# Patient Record
Sex: Male | Born: 1955
Health system: Southern US, Community
[De-identification: ages and names within clinical notes are randomized; demographics above are authoritative.]

## PROBLEM LIST (undated history)

## (undated) DIAGNOSIS — Z86010 Personal history of colonic polyps: Secondary | ICD-10-CM

## (undated) DIAGNOSIS — I639 Cerebral infarction, unspecified: Secondary | ICD-10-CM

## (undated) DIAGNOSIS — M1990 Unspecified osteoarthritis, unspecified site: Secondary | ICD-10-CM

## (undated) DIAGNOSIS — I10 Essential (primary) hypertension: Secondary | ICD-10-CM

## (undated) DIAGNOSIS — R519 Headache, unspecified: Secondary | ICD-10-CM

## (undated) DIAGNOSIS — E785 Hyperlipidemia, unspecified: Secondary | ICD-10-CM

## (undated) DIAGNOSIS — R112 Nausea with vomiting, unspecified: Secondary | ICD-10-CM

## (undated) DIAGNOSIS — F1410 Cocaine abuse, uncomplicated: Secondary | ICD-10-CM

## (undated) DIAGNOSIS — Z860101 Personal history of adenomatous and serrated colon polyps: Secondary | ICD-10-CM

## (undated) DIAGNOSIS — Z9889 Other specified postprocedural states: Secondary | ICD-10-CM

## (undated) DIAGNOSIS — F1010 Alcohol abuse, uncomplicated: Secondary | ICD-10-CM

## (undated) HISTORY — DX: Unspecified osteoarthritis, unspecified site: M19.90

## (undated) HISTORY — PX: HAND SURGERY: SHX662

## (undated) HISTORY — DX: Hyperlipidemia, unspecified: E78.5

## (undated) HISTORY — DX: Cerebral infarction, unspecified: I63.9

## (undated) HISTORY — DX: Personal history of colonic polyps: Z86.010

## (undated) HISTORY — DX: Personal history of adenomatous and serrated colon polyps: Z86.0101

## (undated) HISTORY — PX: COLONOSCOPY: SHX174

## (undated) HISTORY — PX: OTHER SURGICAL HISTORY: SHX169

---

## 1998-08-26 ENCOUNTER — Emergency Department (HOSPITAL_COMMUNITY): Admission: EM | Admit: 1998-08-26 | Discharge: 1998-08-26 | Payer: Self-pay | Admitting: Emergency Medicine

## 1998-08-26 ENCOUNTER — Encounter: Payer: Self-pay | Admitting: Emergency Medicine

## 1998-10-08 ENCOUNTER — Encounter: Payer: Self-pay | Admitting: Family Medicine

## 1998-10-08 ENCOUNTER — Inpatient Hospital Stay (HOSPITAL_COMMUNITY): Admission: EM | Admit: 1998-10-08 | Discharge: 1998-10-13 | Payer: Self-pay | Admitting: Emergency Medicine

## 1998-10-11 ENCOUNTER — Encounter: Payer: Self-pay | Admitting: Family Medicine

## 1998-10-12 ENCOUNTER — Encounter: Payer: Self-pay | Admitting: Cardiology

## 1999-12-16 ENCOUNTER — Emergency Department (HOSPITAL_COMMUNITY): Admission: EM | Admit: 1999-12-16 | Discharge: 1999-12-16 | Payer: Self-pay | Admitting: *Deleted

## 2001-09-15 ENCOUNTER — Emergency Department (HOSPITAL_COMMUNITY): Admission: EM | Admit: 2001-09-15 | Discharge: 2001-09-15 | Payer: Self-pay

## 2002-02-28 ENCOUNTER — Emergency Department (HOSPITAL_COMMUNITY): Admission: EM | Admit: 2002-02-28 | Discharge: 2002-02-28 | Payer: Self-pay | Admitting: Emergency Medicine

## 2002-04-11 ENCOUNTER — Emergency Department (HOSPITAL_COMMUNITY): Admission: EM | Admit: 2002-04-11 | Discharge: 2002-04-11 | Payer: Self-pay | Admitting: Emergency Medicine

## 2003-07-13 ENCOUNTER — Emergency Department (HOSPITAL_COMMUNITY): Admission: EM | Admit: 2003-07-13 | Discharge: 2003-07-13 | Payer: Self-pay

## 2005-07-09 ENCOUNTER — Emergency Department (HOSPITAL_COMMUNITY): Admission: EM | Admit: 2005-07-09 | Discharge: 2005-07-09 | Payer: Self-pay | Admitting: Emergency Medicine

## 2005-07-19 ENCOUNTER — Ambulatory Visit: Payer: Self-pay | Admitting: Family Medicine

## 2005-08-02 ENCOUNTER — Ambulatory Visit: Payer: Self-pay | Admitting: Family Medicine

## 2006-02-11 ENCOUNTER — Ambulatory Visit: Payer: Self-pay | Admitting: Cardiology

## 2006-02-11 ENCOUNTER — Inpatient Hospital Stay (HOSPITAL_COMMUNITY): Admission: EM | Admit: 2006-02-11 | Discharge: 2006-02-13 | Payer: Self-pay | Admitting: *Deleted

## 2006-02-17 ENCOUNTER — Ambulatory Visit: Payer: Self-pay | Admitting: Family Medicine

## 2006-02-21 ENCOUNTER — Ambulatory Visit: Payer: Self-pay | Admitting: *Deleted

## 2006-03-27 ENCOUNTER — Ambulatory Visit: Payer: Self-pay | Admitting: Family Medicine

## 2006-08-08 ENCOUNTER — Ambulatory Visit: Payer: Self-pay | Admitting: Family Medicine

## 2006-08-17 ENCOUNTER — Ambulatory Visit: Payer: Self-pay | Admitting: Internal Medicine

## 2007-04-04 ENCOUNTER — Encounter (INDEPENDENT_AMBULATORY_CARE_PROVIDER_SITE_OTHER): Payer: Self-pay | Admitting: *Deleted

## 2007-05-31 DIAGNOSIS — N410 Acute prostatitis: Secondary | ICD-10-CM | POA: Insufficient documentation

## 2007-05-31 DIAGNOSIS — S381XXA Crushing injury of abdomen, lower back, and pelvis, initial encounter: Secondary | ICD-10-CM | POA: Insufficient documentation

## 2007-05-31 DIAGNOSIS — I10 Essential (primary) hypertension: Secondary | ICD-10-CM | POA: Insufficient documentation

## 2007-05-31 DIAGNOSIS — A5400 Gonococcal infection of lower genitourinary tract, unspecified: Secondary | ICD-10-CM | POA: Insufficient documentation

## 2007-05-31 DIAGNOSIS — F1010 Alcohol abuse, uncomplicated: Secondary | ICD-10-CM | POA: Insufficient documentation

## 2007-05-31 DIAGNOSIS — F1410 Cocaine abuse, uncomplicated: Secondary | ICD-10-CM | POA: Insufficient documentation

## 2007-08-23 ENCOUNTER — Ambulatory Visit: Payer: Self-pay | Admitting: Internal Medicine

## 2007-08-27 ENCOUNTER — Ambulatory Visit: Payer: Self-pay | Admitting: Internal Medicine

## 2008-03-04 ENCOUNTER — Encounter (INDEPENDENT_AMBULATORY_CARE_PROVIDER_SITE_OTHER): Payer: Self-pay | Admitting: *Deleted

## 2008-08-25 ENCOUNTER — Ambulatory Visit: Payer: Self-pay | Admitting: Internal Medicine

## 2008-08-26 ENCOUNTER — Encounter (INDEPENDENT_AMBULATORY_CARE_PROVIDER_SITE_OTHER): Payer: Self-pay | Admitting: Adult Health

## 2008-08-26 LAB — CONVERTED CEMR LAB
AST: 15 units/L (ref 0–37)
Albumin: 4.3 g/dL (ref 3.5–5.2)
BUN: 13 mg/dL (ref 6–23)
Benzodiazepines.: NEGATIVE
CO2: 23 meq/L (ref 19–32)
Calcium: 9.4 mg/dL (ref 8.4–10.5)
Chlamydia, Swab/Urine, PCR: NEGATIVE
Chloride: 102 meq/L (ref 96–112)
Creatinine, Ser: 0.78 mg/dL (ref 0.40–1.50)
Creatinine,U: 111.4 mg/dL
Glucose, Bld: 81 mg/dL (ref 70–99)
Hemoglobin: 13.6 g/dL (ref 13.0–17.0)
Lymphs Abs: 1.3 10*3/uL (ref 0.7–4.0)
Marijuana Metabolite: NEGATIVE
Methadone: NEGATIVE
Monocytes Absolute: 0.5 10*3/uL (ref 0.1–1.0)
Monocytes Relative: 10 % (ref 3–12)
Neutro Abs: 2.7 10*3/uL (ref 1.7–7.7)
Neutrophils Relative %: 57 % (ref 43–77)
PSA: 1.8 ng/mL (ref 0.10–4.00)
Potassium: 4.2 meq/L (ref 3.5–5.3)
Propoxyphene: NEGATIVE
RBC: 4.38 M/uL (ref 4.22–5.81)
WBC: 4.7 10*3/uL (ref 4.0–10.5)

## 2008-08-28 ENCOUNTER — Ambulatory Visit (HOSPITAL_COMMUNITY): Admission: RE | Admit: 2008-08-28 | Discharge: 2008-08-28 | Payer: Self-pay | Admitting: Internal Medicine

## 2008-08-28 DIAGNOSIS — I861 Scrotal varices: Secondary | ICD-10-CM

## 2008-08-28 HISTORY — DX: Scrotal varices: I86.1

## 2008-09-03 ENCOUNTER — Ambulatory Visit: Payer: Self-pay | Admitting: Internal Medicine

## 2008-09-05 ENCOUNTER — Ambulatory Visit: Payer: Self-pay | Admitting: Internal Medicine

## 2008-09-18 ENCOUNTER — Encounter: Admission: RE | Admit: 2008-09-18 | Discharge: 2008-09-18 | Payer: Self-pay | Admitting: Pulmonary Disease

## 2008-12-02 ENCOUNTER — Ambulatory Visit: Payer: Self-pay | Admitting: Internal Medicine

## 2009-01-04 ENCOUNTER — Emergency Department (HOSPITAL_COMMUNITY): Admission: EM | Admit: 2009-01-04 | Discharge: 2009-01-04 | Payer: Self-pay | Admitting: Emergency Medicine

## 2009-01-15 ENCOUNTER — Emergency Department (HOSPITAL_COMMUNITY): Admission: EM | Admit: 2009-01-15 | Discharge: 2009-01-16 | Payer: Self-pay | Admitting: Emergency Medicine

## 2009-02-16 ENCOUNTER — Ambulatory Visit: Payer: Self-pay | Admitting: Internal Medicine

## 2009-02-23 ENCOUNTER — Ambulatory Visit (HOSPITAL_COMMUNITY): Admission: RE | Admit: 2009-02-23 | Discharge: 2009-02-23 | Payer: Self-pay | Admitting: Internal Medicine

## 2009-02-28 ENCOUNTER — Emergency Department (HOSPITAL_COMMUNITY): Admission: EM | Admit: 2009-02-28 | Discharge: 2009-02-28 | Payer: Self-pay | Admitting: Emergency Medicine

## 2009-03-02 ENCOUNTER — Emergency Department (HOSPITAL_COMMUNITY): Admission: EM | Admit: 2009-03-02 | Discharge: 2009-03-02 | Payer: Self-pay | Admitting: Emergency Medicine

## 2009-06-29 ENCOUNTER — Ambulatory Visit: Payer: Self-pay | Admitting: Internal Medicine

## 2009-08-20 ENCOUNTER — Ambulatory Visit: Payer: Self-pay | Admitting: Internal Medicine

## 2009-08-22 ENCOUNTER — Ambulatory Visit (HOSPITAL_COMMUNITY): Admission: RE | Admit: 2009-08-22 | Discharge: 2009-08-22 | Payer: Self-pay | Admitting: Internal Medicine

## 2009-09-10 ENCOUNTER — Ambulatory Visit: Payer: Self-pay | Admitting: Internal Medicine

## 2010-02-23 ENCOUNTER — Ambulatory Visit: Payer: Self-pay | Admitting: Internal Medicine

## 2010-03-30 ENCOUNTER — Emergency Department (HOSPITAL_COMMUNITY): Admission: EM | Admit: 2010-03-30 | Discharge: 2010-03-30 | Payer: Self-pay | Admitting: Family Medicine

## 2010-05-28 ENCOUNTER — Emergency Department (HOSPITAL_COMMUNITY)
Admission: EM | Admit: 2010-05-28 | Discharge: 2010-05-28 | Payer: Self-pay | Source: Home / Self Care | Admitting: Emergency Medicine

## 2010-05-29 ENCOUNTER — Emergency Department (HOSPITAL_COMMUNITY)
Admission: EM | Admit: 2010-05-29 | Discharge: 2010-05-29 | Payer: Self-pay | Source: Home / Self Care | Admitting: Emergency Medicine

## 2010-09-15 ENCOUNTER — Emergency Department (HOSPITAL_COMMUNITY)
Admission: EM | Admit: 2010-09-15 | Discharge: 2010-09-16 | Disposition: A | Discharge status: Other Acute Inpatient Hospital | Payer: Self-pay | Attending: Emergency Medicine | Admitting: Emergency Medicine

## 2010-09-15 DIAGNOSIS — Z862 Personal history of diseases of the blood and blood-forming organs and certain disorders involving the immune mechanism: Secondary | ICD-10-CM | POA: Insufficient documentation

## 2010-09-15 DIAGNOSIS — Z8639 Personal history of other endocrine, nutritional and metabolic disease: Secondary | ICD-10-CM | POA: Insufficient documentation

## 2010-09-15 DIAGNOSIS — F411 Generalized anxiety disorder: Secondary | ICD-10-CM | POA: Insufficient documentation

## 2010-09-15 DIAGNOSIS — R45851 Suicidal ideations: Secondary | ICD-10-CM | POA: Insufficient documentation

## 2010-09-15 LAB — DIFFERENTIAL
Basophils Absolute: 0.1 10*3/uL (ref 0.0–0.1)
Basophils Relative: 3 % — ABNORMAL HIGH (ref 0–1)
Monocytes Absolute: 0.2 10*3/uL (ref 0.1–1.0)
Neutro Abs: 1.7 10*3/uL (ref 1.7–7.7)

## 2010-09-15 LAB — COMPREHENSIVE METABOLIC PANEL
AST: 44 U/L — ABNORMAL HIGH (ref 0–37)
Albumin: 4 g/dL (ref 3.5–5.2)
Calcium: 9 mg/dL (ref 8.4–10.5)
Chloride: 100 mEq/L (ref 96–112)
Creatinine, Ser: 0.79 mg/dL (ref 0.4–1.5)
GFR calc Af Amer: >60 mL/min (ref >60)

## 2010-09-15 LAB — CBC
Hemoglobin: 15.1 g/dL (ref 13.0–17.0)
MCHC: 35.2 g/dL (ref 30.0–36.0)
RDW: 13.3 % (ref 11.5–15.5)
WBC: 3.9 10*3/uL — ABNORMAL LOW (ref 4.0–10.5)

## 2010-09-15 LAB — ETHANOL: Alcohol, Ethyl (B): 384 mg/dL — ABNORMAL HIGH (ref 0–10)

## 2010-09-16 ENCOUNTER — Inpatient Hospital Stay (HOSPITAL_COMMUNITY)
Admission: AD | Admit: 2010-09-16 | Discharge: 2010-09-21 | DRG: 897 | Disposition: A | Discharge status: Home / Self Care | Payer: PRIVATE HEALTH INSURANCE | Source: Ambulatory Visit | Attending: Psychiatry | Admitting: Psychiatry

## 2010-09-16 DIAGNOSIS — F1020 Alcohol dependence, uncomplicated: Secondary | ICD-10-CM

## 2010-09-16 DIAGNOSIS — R45851 Suicidal ideations: Secondary | ICD-10-CM

## 2010-09-16 DIAGNOSIS — F3289 Other specified depressive episodes: Secondary | ICD-10-CM

## 2010-09-16 DIAGNOSIS — Z5900 Homelessness unspecified: Secondary | ICD-10-CM

## 2010-09-16 DIAGNOSIS — F329 Major depressive disorder, single episode, unspecified: Secondary | ICD-10-CM

## 2010-09-16 DIAGNOSIS — I10 Essential (primary) hypertension: Secondary | ICD-10-CM

## 2010-09-16 DIAGNOSIS — F10988 Alcohol use, unspecified with other alcohol-induced disorder: Secondary | ICD-10-CM

## 2010-09-16 LAB — RAPID URINE DRUG SCREEN, HOSP PERFORMED
Amphetamines: NOT DETECTED
Cocaine: NOT DETECTED
Opiates: NOT DETECTED
Tetrahydrocannabinol: NOT DETECTED

## 2010-09-16 LAB — ETHANOL: Alcohol, Ethyl (B): 285 mg/dL — ABNORMAL HIGH (ref 0–10)

## 2010-09-17 DIAGNOSIS — F1020 Alcohol dependence, uncomplicated: Secondary | ICD-10-CM

## 2010-09-21 NOTE — H&P (Addendum)
[name], [name]                ACCOUNT NO.:  [id number]  MEDICAL RECORD NO.:  [id number]           PATIENT TYPE:  I  LOCATION:  0305                          FACILITY:  [facility]  PHYSICIAN:  Anselm Jungling, MD  DATE OF BIRTH:  [birth year]  DATE OF ADMISSION:  09/16/2010 DATE OF DISCHARGE:                      PSYCHIATRIC ADMISSION ASSESSMENT   This is on a 55 year old male voluntarily admitted on September 16, 2010.  HISTORY OF PRESENT ILLNESS:  The patient is here for history of depression and alcohol use.  The patient has been drinking up to a fifth and two 40 ounce beers daily with his last drinks on Thursday.  Reports drinking in the morning.  He has had no seizure activities.  No falls. Experiencing blackouts.  He had a plan to cut his throat.  Endorsing some auditory hallucinations with whispers.  He has not been sleeping. He has lost some weight.  No homicidal ideation.  PAST PSYCHIATRIC HISTORY:  First admission to the [facility].  He has not been in rehab before.  In the past, he has been at the [facility].  Has been on an antidepressant, but cannot recall.  SOCIAL HISTORY:  A 55 year old male who is separated from his wife.  He considers himself homeless at this time.  He has 3 children and 3 grandchildren.  He has no legal charges.  FAMILY HISTORY:  None.  ALCOHOL/DRUG HISTORY:  He is a nonsmoker.  Again alcohol habits as above.  Denies any other substance use.  PRIMARY CARE PROVIDER:  [facility].  MEDICAL PROBLEMS:  Hypertension.  MEDICATIONS:  The patient states currently none, but has been on hydrochlorothiazide in the past.  DRUG ALLERGIES:  NO KNOWN ALLERGIES.  PHYSICAL EXAMINATION:  The patient was fully assessed at [facility] Emergency Department.  This is an older appearing male, disheveled, appears in no distress.  LABORATORY DATA:  Blood alcohol level was 384 on admission, down to 168 on transfer.  CBC is within normal limits.   SGOT is elevated mildly at 44.  MENTAL STATUS EXAM:  He is fully alert and cooperative with good eye contact.  He seems to be hard of hearing, although when asked he denies this.  His speech is soft spoken.  He is polite.  He offers little information, but does answer questions.  Mood is depressed.  He does appear somewhat flat.  Thought process is overall coherent.  No ideas of reference or delusions.  Does report possible auditory hallucinations that he feels might be the nurses that he is hearing.  He does not appear to be overtly internally preoccupied.  Cognitive function intact. His memory is fair.  Judgment and insight are fair.  Poor impulse control related to alcohol use.  AXIS I:  Alcohol dependence, depressive disorder not otherwise specified. AXIS II:  Deferred. AXIS III:  History of hypertension. AXIS IV:  Problems with housing, other psychosocial problems. AXIS V:  Current is 40.  PLAN:  Our plan is to place patient on Librium protocol.  We will have Remeron available for sleep, to increase appetite and for sedation.  We will continue to  monitor withdrawal symptoms.  The patient was encouraged to drink fluids.  Case manager will assess his followup.  We will also identify his support group and assess his motivation for rehab.  His tentative length of stay at this time is 4-6 days.     Landry Corporal, N.P.   ______________________________ Anselm Jungling, MD    JO/MEDQ  D:  09/17/2010  T:  09/17/2010  Job:  [id number]  Electronically Signed by Limmie PatriciaP. on 09/20/2010 03:25:25 PM Electronically Signed by Geralyn Flash MD on 09/21/2010 10:44:01 AM Electronically Signed by Geralyn Flash MD on 09/21/2010 10:55:55 AM Electronically Signed by Geralyn Flash MD on 09/21/2010 11:05:59 AM Electronically Signed by Geralyn Flash MD on 09/21/2010 11:16:18 AM Electronically Signed by Geralyn Flash MD on 09/21/2010 11:28:37 AM Electronically Signed by Geralyn Flash MD on 09/21/2010 11:41:56 AM Electronically Signed by Geralyn Flash MD on 09/21/2010 11:55:29 AM Electronically Signed by Geralyn Flash MD on 09/21/2010 12:09:58 PM Electronically Signed by Geralyn Flash MD on 09/21/2010 12:25:30 PM Electronically Signed by Geralyn Flash MD on 09/21/2010 12:42:09 PM Electronically Signed by Geralyn Flash MD on 09/21/2010 01:00:18 PM Electronically Signed by Geralyn Flash MD on 09/21/2010 01:19:47 PM Electronically Signed by Geralyn Flash MD on 09/21/2010 01:45:37 PM Electronically Signed by Geralyn Flash MD on 09/21/2010 02:01:16 PM Electronically Signed by Geralyn Flash MD on 09/21/2010 02:21:33 PM Electronically Signed by Geralyn Flash MD on 09/21/2010 02:43:55 PM Electronically Signed by Geralyn Flash MD on 09/21/2010 03:07:26 PM Electronically Signed by Geralyn Flash MD on 09/21/2010 03:32:18 PM Electronically Signed by Geralyn Flash MD on 09/21/2010 04:06:29 PM Electronically Signed by Geralyn Flash MD on 09/21/2010 04:37:52 PM Electronically Signed by Geralyn Flash MD on 09/21/2010 05:09:26 PM Electronically Signed by Geralyn Flash MD on 09/21/2010 05:09:26 PM Electronically Signed by Geralyn Flash MD on 09/21/2010 05:38:17 PM Electronically Signed by Geralyn Flash MD on 09/21/2010 06:27:29 PM Electronically Signed by Geralyn Flash MD on 09/21/2010 07:43:20 PM

## 2010-09-23 NOTE — Discharge Summary (Signed)
  NAMEJOAOPEDRO, ESCHBACH                ACCOUNT NO.:  [id number]  MEDICAL RECORD NO.:  [id number]           PATIENT TYPE:  I  LOCATION:  0305                          FACILITY:  [facility]  PHYSICIAN:  Anselm Jungling, MD  DATE OF BIRTH:  11/13/1955  DATE OF ADMISSION:  09/16/2010 DATE OF DISCHARGE:  09/21/2010                              DISCHARGE SUMMARY   IDENTIFYING DATA/REASON FOR ADMISSION:  This was an inpatient psychiatric admission for [name], a 55 year old African American male who presented for treatment of alcohol dependence.  Associated with this was depression and some suicidal ideation.  Please refer to the admission note for further details pertaining to the symptoms, circumstances and history that led to his hospitalization.  He was given an initial Axis I diagnosis of alcohol dependence, and substance-induced mood disorder.  MEDICAL AND LABORATORY:  The patient was medically and physically assessed by the psychiatric nurse practitioner.  He was in generally good health, but had a history of hypertension.  He was continued on hydrochlorothiazide 12.5 mg daily.  There were no other significant medical issues.  HOSPITAL COURSE:  The patient was admitted to the adult inpatient psychiatric service.  He presented as a slender, but normally-developed and adequately nourished [place] American male who was pleasant and cooperative.  Mood was mildly depressed with grim affect.  However, he generally related in a pleasant and friendly fashion.  He was quite cooperative.  He participated in therapeutic groups and activities consistently.  These included 12-step recovery groups.  Remeron 15 mg was utilized at bedtime for sleep.  Although he had some uncomfortable withdrawal symptoms, early on, by the end of his hospital stay he was feeling much better physically and cognitively.  He indicated that he wanted follow-up care and he agreed to the following aftercare plan.  AFTERCARE:   The patient was to follow-up at [facility] with an appointment on March 9th at 10:45 a.m..  His brother was to take him up from the hospital.  DISCHARGE MEDICATIONS:  Remeron 15 mg q.h.s., hydrochlorothiazide 12.5 mg daily.  DISCHARGE DIAGNOSES:  AXIS I:  Alcohol dependence, early remission and substance-induced mood disorder.  AXIS II: Deferred. AXIS III: Hypertension. AXIS IV: Stressors severe. AXIS V: GAF on discharge 45.  At the time of discharge the suicide risk assessment was completed and he was felt to be at minimal risk.     Anselm Jungling, MD     SPB/MEDQ  D:  09/21/2010  T:  09/21/2010  Job:  [id number]  Electronically Signed by Geralyn Flash MD on 09/22/2010 09:38:58 AM

## 2010-09-27 ENCOUNTER — Emergency Department (HOSPITAL_COMMUNITY)
Admission: EM | Admit: 2010-09-27 | Discharge: 2010-09-27 | Disposition: A | Discharge status: Home / Self Care | Payer: Self-pay | Attending: Emergency Medicine | Admitting: Emergency Medicine

## 2010-09-27 ENCOUNTER — Emergency Department (HOSPITAL_COMMUNITY): Payer: Self-pay

## 2010-09-27 DIAGNOSIS — S99929A Unspecified injury of unspecified foot, initial encounter: Secondary | ICD-10-CM | POA: Insufficient documentation

## 2010-09-27 DIAGNOSIS — Z8639 Personal history of other endocrine, nutritional and metabolic disease: Secondary | ICD-10-CM | POA: Insufficient documentation

## 2010-09-27 DIAGNOSIS — X500XXA Overexertion from strenuous movement or load, initial encounter: Secondary | ICD-10-CM | POA: Insufficient documentation

## 2010-09-27 DIAGNOSIS — M25569 Pain in unspecified knee: Secondary | ICD-10-CM | POA: Insufficient documentation

## 2010-09-27 DIAGNOSIS — Z79899 Other long term (current) drug therapy: Secondary | ICD-10-CM | POA: Insufficient documentation

## 2010-09-27 DIAGNOSIS — S8990XA Unspecified injury of unspecified lower leg, initial encounter: Secondary | ICD-10-CM | POA: Insufficient documentation

## 2010-09-27 DIAGNOSIS — R079 Chest pain, unspecified: Secondary | ICD-10-CM | POA: Insufficient documentation

## 2010-09-27 DIAGNOSIS — IMO0002 Reserved for concepts with insufficient information to code with codable children: Secondary | ICD-10-CM | POA: Insufficient documentation

## 2010-09-27 DIAGNOSIS — Z862 Personal history of diseases of the blood and blood-forming organs and certain disorders involving the immune mechanism: Secondary | ICD-10-CM | POA: Insufficient documentation

## 2010-09-28 LAB — CBC
HCT: 39.8 % (ref 39.0–52.0)
Hemoglobin: 13.6 g/dL (ref 13.0–17.0)
MCH: 32.2 pg (ref 26.0–34.0)
MCHC: 34.2 g/dL (ref 30.0–36.0)
MCV: 94.1 fL (ref 78.0–100.0)
Platelets: 144 10*3/uL — ABNORMAL LOW (ref 150–400)
RBC: 4.23 MIL/uL (ref 4.22–5.81)
RDW: 11.8 % (ref 11.5–15.5)
WBC: 4.7 10*3/uL (ref 4.0–10.5)

## 2010-09-28 LAB — COMPREHENSIVE METABOLIC PANEL
AST: 43 U/L — ABNORMAL HIGH (ref 0–37)
Albumin: 3.9 g/dL (ref 3.5–5.2)
BUN: 14 mg/dL (ref 6–23)
Chloride: 97 mEq/L (ref 96–112)
Creatinine, Ser: 0.74 mg/dL (ref 0.4–1.5)
GFR calc Af Amer: >60 mL/min (ref >60)
Total Bilirubin: 1.2 mg/dL (ref 0.3–1.2)
Total Protein: 7.2 g/dL (ref 6.0–8.3)

## 2010-09-28 LAB — DIFFERENTIAL
Basophils Absolute: 0 10*3/uL (ref 0.0–0.1)
Basophils Relative: 1 % (ref 0–1)
Eosinophils Absolute: 0.1 10*3/uL (ref 0.0–0.7)
Eosinophils Relative: 2 % (ref 0–5)
Lymphocytes Relative: 19 % (ref 12–46)
Lymphs Abs: 0.9 10*3/uL (ref 0.7–4.0)
Monocytes Absolute: 0.5 10*3/uL (ref 0.1–1.0)
Monocytes Relative: 10 % (ref 3–12)
Neutro Abs: 3.2 10*3/uL (ref 1.7–7.7)
Neutrophils Relative %: 69 % (ref 43–77)

## 2010-09-28 LAB — URINALYSIS, ROUTINE W REFLEX MICROSCOPIC
Bilirubin Urine: NEGATIVE
Ketones, ur: 15 mg/dL — AB
Nitrite: NEGATIVE
Specific Gravity, Urine: 1.024 (ref 1.005–1.030)
Urobilinogen, UA: 0.2 mg/dL (ref 0.0–1.0)
pH: 8 (ref 5.0–8.0)

## 2010-09-28 LAB — URINE CULTURE
Culture  Setup Time: 201111121815
Culture: NO GROWTH

## 2010-09-28 LAB — URINE MICROSCOPIC-ADD ON

## 2010-12-03 NOTE — H&P (Signed)
[name], [name]                ACCOUNT NO.:  [id number]   MEDICAL RECORD NO.:  [id number]          PATIENT TYPE:  EMS   LOCATION:  MAJO                         FACILITY:  [facility]   PHYSICIAN:  Willa Rough, M.D.     DATE OF BIRTH:  04-Apr-1956   DATE OF ADMISSION:  02/11/2006  DATE OF DISCHARGE:                                HISTORY & PHYSICAL   PRIMARY CARE PHYSICIAN:  Dr. Fannie Knee at [facility].   He is now to [facility].   PATIENT PROFILE:  A 55 year old African American male with no prior history  of CAD who presents with chest pain and dizziness.   PROBLEM LIST:  1.  Hypertension, untreated.  2.  Ongoing tobacco abuse, currently 1-2 cigarettes per week.  3.  ETOH abuse.  4.  Medical non-adherence.   HISTORY OF PRESENT ILLNESS:  A 55 year old African American male with a  prior history of hypertension which has been untreated at least for the past  year who was in his usual state of health until yesterday at approximately 2  p.m. when he awoke from a nap with 4 to 5 out of 10 left chest and left  scapula tightness.  After getting up, he became nauseated with vomiting x1  as well as diaphoresis and dizziness.  The symptoms resolved after  approximately 60 minutes and he thinks his symptoms were improved with  vomiting.  This morning he woke up at 6 a.m. with recurrent dizziness and  noted 4 to 5 out of 10 left chest tightness similar to yesterday.  This  prompted him to present to the ED where symptoms resolved with aspirin.  Cardiac markers are negative x2 and EKG revealed LVH.  He is currently pain  free.   ALLERGIES:  NO KNOWN DRUG ALLERGIES.   CURRENT MEDICATIONS:  He was taking a blood pressure pill approximately 1  year ago but came off this for no particular reason.   FAMILY HISTORY:  Mother is alive at age 44 with hypertension.  Father is age  15 with hypertension.  He has 6 brothers and 1 sister, all are alive and  well.   SOCIAL HISTORY:  He lives in  [place] by himself.  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  He has 3 children.  He is not married.  He  smokes about 1-2 cigarettes per week currently and previously smoked about 5  cigarettes daily for the past 20 years.  He has about 4 beers and 1 shot of  liquor a day.  He denies any drug use and does not routinely exercise.  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   REVIEW OF SYSTEMS:  Positive for dizziness and diaphoresis as outlined in  the HPI.  Positive for chest pain, nausea, and vomiting.  All other systems  reviewed and negative.   PHYSICAL EXAMINATION:  VITAL SIGNS:  Temperature 97.6, heart rate 79,  respirations 22, blood pressure 153/97, pulse ox 99% on room air.  GENERAL:  A pleasant African American male in no acute distress, awake,  alert, and oriented x3.  NECK:  Normal carotid upstrokes.  No bruits or JVD.  LUNGS:  Respirations regular non-labored.  Clear to auscultation.  CARDIAC:  Regular S1 S2.  Normal S3, S4, or murmurs.  ABDOMEN:  Round, soft, nontender, nondistended.  Bowel sounds present x4.  EXTREMITIES:  Warm, dry, pink.  No clubbing, cyanosis, or edema.  Dorsalis  pedis, posterior tibial pulses2+ and equal bilaterally.   Chest x-ray shows no acute finding.  EKG shows sinus rhythm with LVH, normal  axis and rate is 60 beats per minute.   LABORATORY WORK:  Hemoglobin 14.8, hematocrit 43.1, WBC 3.3, platelets 193.  Sodium 127, potassium 3.6, chloride 93, CO2 30.2, BUN 6, creatinine 1.0,  glucose 84.  CK-MB 3.0 and 2.7.  Troponin I less than 0.05 x2.   ASSESSMENT/PLAN:  1.  Chest pain.  He is currently pain free.  Symptoms yesterday with      associated nausea, vomiting, diaphoresis somewhat concerning.  Admit,      cycle cardiac makers.  If cardiac markers are negative, probable      inpatient Myoview in the a.m. and if that were to be negative consider      discharge.  We will add aspirin and beta-blocker for the time  being.      Check lipids and LFTs add Statin if necessary.  2.  Hypertension, inadequately controlled.  We will add beta-blocker while      in-house secondary to chest pain.  If cardiac markers are and functional      studies are negative, plan to discharge on diuretic therapy.  3.  Tobacco/ETOH abuse.  Cessation strongly advised.  We will obtain a      smoking cessation consult.      Ok Anis, NP    ______________________________  Willa Rough, M.D.    CRB/MEDQ  D:  02/11/2006  T:  02/11/2006  Job:  [id number]

## 2010-12-03 NOTE — Discharge Summary (Signed)
[name], [name]                ACCOUNT NO.:  [id number]   MEDICAL RECORD NO.:  [id number]          PATIENT TYPE:  INP   LOCATION:  2040                         FACILITY:  [facility]   PHYSICIAN:  Tereso Newcomer, P.A.     DATE OF BIRTH:  [birth year]   DATE OF ADMISSION:  02/11/2006  DATE OF DISCHARGE:  02/13/2006                                 DISCHARGE SUMMARY   PRIMARY CARDIOLOGIST:  He is new to Dr. Myrtis Ser.   PRIMARY CARE PHYSICIAN:  Dr. Fannie Knee at [facility].   REASON FOR ADMISSION:  Chest pain.   DISCHARGE DIAGNOSES:  1.  Noncardiac chest pain.  Nonischemic Myoview this admission.  2.  Alcohol abuse.  3.  Treated hypertension.  4.  Untreated dyslipidemia.  5.  Medical noncompliance secondary to financial difficulties.   HISTORY:  [name] is a 55 year old male patient who presented to [facility] emergency room with complaints of chest discomfort.  His  electrocardiogram was remarkable for sinus rhythm with LVH, normal axis, no  acute changes.  His initial markers were negative.  He was admitted for  further evaluation.   HOSPITAL COURSE:  The patient was subsequently ruled out for myocardial  infarction.  He underwent a stress Myoview study on February 12, 2006.  The  patient exercised 12 minutes and had no  ST changes.  His perfusion images  revealed an EF of 47%, with mild septal hypokinesis but no ischemia.  No  further cardiac workup  was warranted.  On admission the patient did note  that he drank quite a bit of alcohol.  He also admitted to symptoms of  tremor.  He was started on alcohol withdrawal protocol.  On the morning of  February 13, 2006 Dr. Myrtis Ser saw the patient and thought he was stable enough for  discharged to home.  However, the patient would need followup for alcohol  cessation and treatment of his withdrawal symptoms.  He had been stable up  until this point.  At this point in time the patient is scheduled for  Librium 25 mg tonight and 25 mg tomorrow, and then it  can be discontinued.  We have arranged for him to get his Librium through our indigent program in  the hospital, and he agrees to take his dose tonight and tomorrow.  He also  met with a Child psychotherapist and agrees to outpatient treatment of his alcohol  dependence by going to [facility].  He has no insurance and is not  currently interested in  inpatient management of his alcohol abuse.   LABORATORIES AND ANCILLARY DATA:  White count 4500, hemoglobin 16.2,  hematocrit 48.1, platelet count 174,000, INR 0.9, sodium 131, potassium 3.5,  chloride 93, CO2 of 30, glucose 110, BUN 11, creatinine 1.0, total bilirubin  1.5, alkaline phosphatase 76, AST 85, ALT 72, total protein 6.6, albumin  3.6, calcium 9.6, cardiac markers as noted above, total cholesterol 298,  triglycerides 48, HDL 114, LDL 174, TSH 2.207, Myoview results as noted  above, EF 47%, mild septal hypokinesis, no evidence of  ischemia.  Chest x-  ray from February 11, 2006 no acute findings.   DISCHARGE MEDICATIONS:  1.  Hydrochlorothiazide 25 mg daily.  2.  Coated aspirin 81 mg daily.  3.  K-Dur 20 mEq daily.  4.  Librium 25 mg 1 dose tonight, 1 dose in the morning.  5.  Multivitamin daily.   DIET:  Low-fat, low-sodium, low-cholesterol diet.   ACTIVITY:  He is to increase his activities slowly.   FOLLOWUP:  The patient should follow up with Dr. Fannie Knee at [facility] in the  next 1-2 weeks.  He will be provided with medications through our indigent  program at [facility].  He knows to take his Librium tonight and  again in the morning, and then to discontinue this after that.  He has also  been recommended to take a multivitamin daily.  It has been recommended to  him that if he develops any tremors or hallucinations, he should go directly  to the emergency room, although he has been stable up until this point.   Total physician PA time greater than 30 minutes on this discharge.      Tereso Newcomer, P.A.      SW/MEDQ  D:  02/13/2006  T:  02/13/2006  Job:  [id number]   cc:   Paul Dykes. Grant Ruts., M.D.  Fax: [phone]

## 2011-02-23 ENCOUNTER — Other Ambulatory Visit (HOSPITAL_COMMUNITY): Payer: Self-pay | Admitting: Family Medicine

## 2011-02-23 DIAGNOSIS — M25561 Pain in right knee: Secondary | ICD-10-CM

## 2011-02-23 DIAGNOSIS — M79641 Pain in right hand: Secondary | ICD-10-CM

## 2011-03-01 ENCOUNTER — Ambulatory Visit (HOSPITAL_COMMUNITY)
Admission: RE | Admit: 2011-03-01 | Discharge: 2011-03-01 | Disposition: A | Discharge status: Home / Self Care | Payer: PRIVATE HEALTH INSURANCE | Source: Ambulatory Visit | Attending: Family Medicine | Admitting: Family Medicine

## 2011-03-01 DIAGNOSIS — M19049 Primary osteoarthritis, unspecified hand: Secondary | ICD-10-CM | POA: Insufficient documentation

## 2011-03-01 DIAGNOSIS — M25561 Pain in right knee: Secondary | ICD-10-CM

## 2011-03-01 DIAGNOSIS — M79641 Pain in right hand: Secondary | ICD-10-CM

## 2011-03-01 DIAGNOSIS — M25549 Pain in joints of unspecified hand: Secondary | ICD-10-CM | POA: Insufficient documentation

## 2011-03-03 ENCOUNTER — Ambulatory Visit (HOSPITAL_COMMUNITY)
Admission: RE | Admit: 2011-03-03 | Discharge: 2011-03-03 | Disposition: A | Discharge status: Home / Self Care | Payer: PRIVATE HEALTH INSURANCE | Source: Ambulatory Visit | Attending: Family Medicine | Admitting: Family Medicine

## 2011-03-03 DIAGNOSIS — M2240 Chondromalacia patellae, unspecified knee: Secondary | ICD-10-CM | POA: Insufficient documentation

## 2011-03-03 DIAGNOSIS — M23329 Other meniscus derangements, posterior horn of medial meniscus, unspecified knee: Secondary | ICD-10-CM | POA: Insufficient documentation

## 2011-03-03 DIAGNOSIS — M25569 Pain in unspecified knee: Secondary | ICD-10-CM | POA: Insufficient documentation

## 2011-03-10 ENCOUNTER — Ambulatory Visit (INDEPENDENT_AMBULATORY_CARE_PROVIDER_SITE_OTHER): Payer: PRIVATE HEALTH INSURANCE | Admitting: Family Medicine

## 2011-03-10 VITALS — BP 133/90 | Ht 69.0 in | Wt 150.0 lb

## 2011-03-10 DIAGNOSIS — IMO0002 Reserved for concepts with insufficient information to code with codable children: Secondary | ICD-10-CM

## 2011-03-10 DIAGNOSIS — S83249A Other tear of medial meniscus, current injury, unspecified knee, initial encounter: Secondary | ICD-10-CM

## 2011-03-10 MED ORDER — MELOXICAM 15 MG PO TABS: 15.0000 mg | ORAL_TABLET | Freq: Every day | ORAL | Status: DC

## 2011-03-10 NOTE — Progress Notes (Signed)
  Subjective:    Patient ID: [name], male    DOB: 03-10-1956, 55 y.o.   MRN: [id number]  HPI  [name] is a pleasant 55 year old male patient coming in today complaining of right knee pain. He had an injury on March while he was working on his car and his twisted his right knee. He developed an excruciating pain in his right knee. He was seen at [place] and x-rays were done of his knee ruling out any fracture or dislocation. He was given a knee brace and anti-inflammatory for pain control and recommended followup with his PCP. He states that his knee pain is located on the medial joint line. It has improved since March about 70%. 410 intensity. Sharp pain. No radiated. No mechanical symptoms locking clicking or giving away. No swelling. He is able to walk and bear weight on his right knee but he is limping. He has been taking Motrin on an WHICH has helping with the pain. He had an MRI of his knee done recently ordered by the outpatient clinic and shown a medial meniscal tear. For the [facility] the of creams appropriately leave him in his walking extended hours to his job.   Patient Active Problem List  Diagnoses  . GONORRHEA  . ALCOHOL ABUSE  . COCAINE ABUSE  . HYPERTENSION  . PROSTATITIS, ACUTE  . CRUSHING INJURY, BACK   No current outpatient prescriptions on file prior to visit.   Allergies  Allergen Reactions  . Penicillins       Review of Systems  Constitutional: Negative for fever, chills, diaphoresis and fatigue.  Musculoskeletal: Positive for gait problem.       R knee pain w/hpi  Neurological: Negative for weakness and numbness.       Objective:   Physical Exam  Constitutional: He appears well-developed and well-nourished.       BP 133/90  Ht 5\' 9"  (1.753 m)  Wt 150 lb (68.04 kg)  BMI 22.15 kg/m2   Eyes: EOM are normal.  Musculoskeletal:       Right knee with intact skin, FROM.  No Patellofemoral crepitus present with flexion and extension.  To palpation on  the medial line. McMurray test negative for meniscal tear. Thesaly test positive for meniscal tear No tenderness on the quad neither or patellar tendon. Ligaments intact. Lachman neg. Varus and valgus test at 0 and 30 degres neg Poor quad muscle definition.  Tight hamstrings Gait independent with a mild limp favoring the right side       Neurological: He is alert.  Skin: Skin is warm. No rash noted. No erythema. No pallor.  Psychiatric: He has a normal mood and affect. Judgment normal.       Assessment & Plan:   1. Acute meniscal tear, medial  meloxicam (MOBIC) 15 MG tablet   Quad strengthening exercise explained and handouts given to the patient The option of a cortisone intra-articular injection which the patient denied the patient is at this visit. Cryotherapy recommended after long hours of walking Followup in 4 week

## 2011-10-21 ENCOUNTER — Emergency Department (HOSPITAL_COMMUNITY): Payer: Self-pay

## 2011-10-21 ENCOUNTER — Encounter (HOSPITAL_COMMUNITY): Payer: Self-pay | Admitting: *Deleted

## 2011-10-21 ENCOUNTER — Emergency Department (HOSPITAL_COMMUNITY)
Admission: EM | Admit: 2011-10-21 | Discharge: 2011-10-21 | Disposition: A | Discharge status: Home / Self Care | Payer: Self-pay | Attending: Emergency Medicine | Admitting: Emergency Medicine

## 2011-10-21 DIAGNOSIS — R220 Localized swelling, mass and lump, head: Secondary | ICD-10-CM | POA: Insufficient documentation

## 2011-10-21 DIAGNOSIS — R221 Localized swelling, mass and lump, neck: Secondary | ICD-10-CM

## 2011-10-21 DIAGNOSIS — M542 Cervicalgia: Secondary | ICD-10-CM | POA: Insufficient documentation

## 2011-10-21 HISTORY — DX: Alcohol abuse, uncomplicated: F10.10

## 2011-10-21 LAB — POCT I-STAT, CHEM 8
BUN: 11 mg/dL (ref 6–23)
Chloride: 102 mEq/L (ref 96–112)
Sodium: 142 mEq/L (ref 135–145)

## 2011-10-21 LAB — CBC
HCT: 41.9 % (ref 39.0–52.0)
Hemoglobin: 14 g/dL (ref 13.0–17.0)
MCH: 29.2 pg (ref 26.0–34.0)
MCHC: 33.4 g/dL (ref 30.0–36.0)
MCV: 87.3 fL (ref 78.0–100.0)
RBC: 4.8 MIL/uL (ref 4.22–5.81)

## 2011-10-21 LAB — DIFFERENTIAL
Basophils Absolute: 0 10*3/uL (ref 0.0–0.1)
Lymphocytes Relative: 35 % (ref 12–46)
Neutro Abs: 2.2 10*3/uL (ref 1.7–7.7)

## 2011-10-21 NOTE — ED Notes (Signed)
Pt presents with palpable mass on lateral side of rt lower neck consistent with lymphedema. Denies recent illness or injury. Strong smell of etoh noted on pt.

## 2011-10-21 NOTE — ED Notes (Signed)
Bump on side neck x a couple of days

## 2011-10-21 NOTE — ED Provider Notes (Signed)
Medical screening examination/treatment/procedure(s) were performed by non-physician practitioner and as supervising physician I was immediately available for consultation/collaboration.   Carleene Cooper III, MD 10/21/11 2123

## 2011-10-21 NOTE — ED Notes (Signed)
Patient transported to X-ray 

## 2011-10-21 NOTE — ED Notes (Signed)
Returned from xray

## 2011-10-21 NOTE — ED Provider Notes (Signed)
History     CSN: [id number]  Arrival date & time 10/21/11  4098   First MD Initiated Contact with Patient 10/21/11 [phone]      Chief Complaint  Patient presents with  . Neck Pain    (Consider location/radiation/quality/duration/timing/severity/associated sxs/prior treatment) HPI Comments: Patient with a history of smoking presents emergency department with a chief complaint of a bump on the side of his neck.  Patient states he noticed it about 2-3 days ago.  Patient denies any neck pain, decreased range of motion, headache, vision changes, numbness or tingling of extremities, recent weight loss, changes in his skin, difficulty swallowing, fever, night sweats, chills decreased appetite, shortness of breath, chest pain or any recent illness including cough, sore throat or flulike symptoms.  Patient is asymptomatic at this time.  Patient is a 56 y.o. male presenting with neck pain. The history is provided by the patient.  Neck Pain  Pertinent negatives include no chest pain, no numbness, no headaches and no weakness.    Past Medical History  Diagnosis Date  . ETOH abuse     No past surgical history on file.  No family history on file.  History  Substance Use Topics  . Smoking status: Not on file  . Smokeless tobacco: Not on file  . Alcohol Use: Yes     Everyday      Review of Systems  Constitutional: Negative for fever, chills and appetite change.  HENT: Negative for congestion, neck pain and neck stiffness.   Eyes: Negative for visual disturbance.  Respiratory: Negative for shortness of breath.   Cardiovascular: Negative for chest pain and leg swelling.  Gastrointestinal: Negative for abdominal pain.  Genitourinary: Negative for dysuria, urgency and frequency.  Neurological: Negative for dizziness, syncope, weakness, light-headedness, numbness and headaches.  Psychiatric/Behavioral: Negative for confusion.  All other systems reviewed and are negative.    Allergies    Penicillins  Home Medications  No current outpatient prescriptions on file.  BP 130/110  Pulse 65  Temp 98.1 F (36.7 C)  Resp 16  SpO2 99%  Physical Exam  Constitutional: He is oriented to person, place, and time. He appears well-developed and well-nourished. No distress.  HENT:  Head: Normocephalic and atraumatic.  Mouth/Throat: Oropharynx is clear and moist. No oropharyngeal exudate.       tongue not swollen, uvula midline, airway intact.   Eyes: Conjunctivae and EOM are normal. Pupils are equal, round, and reactive to light. No scleral icterus.  Neck: Normal range of motion. Neck supple. No tracheal deviation present. No thyromegaly present.       Swollen superficial lymph node located in the anterior cervical triangle, no ttp. Neck supple  Cardiovascular: Normal rate, regular rhythm, normal heart sounds and intact distal pulses.   Pulmonary/Chest: Effort normal and breath sounds normal. No stridor. No respiratory distress. He has no wheezes.       LCAB, no wheezing or stridor.   Abdominal: Soft.  Musculoskeletal: Normal range of motion. He exhibits no edema and no tenderness.  Neurological: He is alert and oriented to person, place, and time. Coordination normal.  Skin: Skin is warm and dry. No rash noted. He is not diaphoretic. No erythema. No pallor.       No bruising  Psychiatric: He has a normal mood and affect. His behavior is normal.    ED Course  Procedures (including critical care time)   Labs Reviewed  CBC  DIFFERENTIAL  POCT I-STAT, CHEM 8   Dg Chest  2 View  10/21/2011  *RADIOLOGY REPORT*  Clinical Data: Knot at base of right neck for 3 days.  Smoker. High blood pressure.  CHEST - 2 VIEW  Comparison: 08/22/2009.  Findings: Mild asymmetry of the neck soft tissue.  This may represent patient's palpable abnormality.  Etiology indeterminate. No plain film findings of primary pulmonary malignancy as a cause for possible right supraclavicular adenopathy.  No  infiltrate, congestive heart failure or pneumothorax.  Slight increase in size of the heart compared to the prior exam.  Tortuous aorta.  Minimal peribronchial thickening stable.  IMPRESSION: Slight change in appearance of the lower right neck may correspond to the patient's palpable abnormality.  Etiology indeterminate. Please see above  Original Report Authenticated By: Fuller Canada, M.D.     No diagnosis found.    MDM  Neck lump  Labs reviewed and within normal limits.  X-ray without indication of acute pulmonary mass.  [facility] and has been advised to followup with them in regards to the lump on his neck.  Likely etiology is a lymph node.  It was discussed with patient that if the lump persists more than 2 weeks it needs to be further evaluated to assure that it is not some type of cancer.  Patient is currently asymptomatic with vital signs stable and has no other complaints at this time.  Patient verbalizes understanding and states he will followup with [facility].  Patient also been advised to return to the emergency department if he develops numbness tingling or weakness of extremities.      Jaci Carrel, [place] 10/21/11 1117

## 2011-10-21 NOTE — Discharge Instructions (Signed)
Call [facility] today to schedule a followup appointment in 2 weeks time.  It is very important that you keep this followup appointment if your neck lumps have not resolved.  Return to the emergency department if he begin to develop symptoms that are concerning including numbness, weakness or tingling of arm.

## 2011-10-28 ENCOUNTER — Encounter (HOSPITAL_COMMUNITY): Payer: Self-pay | Admitting: *Deleted

## 2011-10-28 ENCOUNTER — Emergency Department (HOSPITAL_COMMUNITY)
Admission: EM | Admit: 2011-10-28 | Discharge: 2011-10-28 | Disposition: A | Discharge status: Home / Self Care | Payer: Self-pay | Attending: Emergency Medicine | Admitting: Emergency Medicine

## 2011-10-28 ENCOUNTER — Emergency Department (HOSPITAL_COMMUNITY): Payer: Self-pay

## 2011-10-28 DIAGNOSIS — S93401A Sprain of unspecified ligament of right ankle, initial encounter: Secondary | ICD-10-CM

## 2011-10-28 DIAGNOSIS — X58XXXA Exposure to other specified factors, initial encounter: Secondary | ICD-10-CM | POA: Insufficient documentation

## 2011-10-28 DIAGNOSIS — S93409A Sprain of unspecified ligament of unspecified ankle, initial encounter: Secondary | ICD-10-CM | POA: Insufficient documentation

## 2011-10-28 MED ORDER — IBUPROFEN 200 MG PO TABS: 400.0000 mg | ORAL_TABLET | Freq: Four times a day (QID) | ORAL | Status: DC | PRN

## 2011-10-28 MED ORDER — TRAMADOL HCL 50 MG PO TABS: 50.0000 mg | ORAL_TABLET | Freq: Four times a day (QID) | ORAL | Status: DC | PRN

## 2011-10-28 MED ORDER — TRAMADOL HCL 50 MG PO TABS: 50.0000 mg | ORAL_TABLET | Freq: Four times a day (QID) | ORAL | Status: AC | PRN

## 2011-10-28 NOTE — Discharge Instructions (Signed)
Take ibuprofen, up to 800mg  three times a day, as needed for pain.  Take ultram if pain is severe.  Do not drive within four hours of taking this medication (may cause drowsiness or confusion).   Ice 3 times a day for 15-20 minutes.  Elevate when possible.  Activity as tolerated.  You may return to the ER if your pain worsens or you have any other concerns.  Ankle Sprain An ankle sprain is an injury to the strong, fibrous tissues (ligaments) that hold the bones of your ankle joint together.  CAUSES Ankle sprain usually is caused by a fall or by twisting your ankle. People who participate in sports are more prone to these types of injuries.  SYMPTOMS  Symptoms of ankle sprain include:  Pain in your ankle. The pain may be present at rest or only when you are trying to stand or walk.   Swelling.   Bruising. Bruising may develop immediately or within 1 to 2 days after your injury.   Difficulty standing or walking.  DIAGNOSIS  Your caregiver will ask you details about your injury and perform a physical exam of your ankle to determine if you have an ankle sprain. During the physical exam, your caregiver will press and squeeze specific areas of your foot and ankle. Your caregiver will try to move your ankle in certain ways. An X-ray exam may be done to be sure a bone was not broken or a ligament did not separate from one of the bones in your ankle (avulsion).  TREATMENT  Certain types of braces can help stabilize your ankle. Your caregiver can make a recommendation for this. Your caregiver may recommend the use of medication for pain. If your sprain is severe, your caregiver may refer you to a surgeon who helps to restore function to parts of your skeletal system (orthopedist) or a physical therapist. HOME CARE INSTRUCTIONS  Apply ice to your injury for 1 to 2 days or as directed by your caregiver. Applying ice helps to reduce inflammation and pain.  Put ice in a plastic bag.   Place a towel between  your skin and the bag.   Leave the ice on for 15 to 20 minutes at a time, every 2 hours while you are awake.   Take over-the-counter or prescription medicines for pain, discomfort, or fever only as directed by your caregiver.   Keep your injured leg elevated, when possible, to lessen swelling.   If your caregiver recommends crutches, use them as instructed. Gradually, put weight on the affected ankle. Continue to use crutches or a cane until you can walk without feeling pain in your ankle.   If you have a plaster splint, wear the splint as directed by your caregiver. Do not rest it on anything harder than a pillow the first 24 hours. Do not put weight on it. Do not get it wet. You may take it off to take a shower or bath.   You may have been given an elastic bandage to wear around your ankle to provide support. If the elastic bandage is too tight (you have numbness or tingling in your foot or your foot becomes cold and blue), adjust the bandage to make it comfortable.   If you have an air splint, you may blow more air into it or let air out to make it more comfortable. You may take your splint off at night and before taking a shower or bath.   Wiggle your toes in the  splint several times per day if you are able.  SEEK MEDICAL CARE IF:   You have an increase in bruising, swelling, or pain.   Your toes feel cold.   Pain relief is not achieved with medication.  SEEK IMMEDIATE MEDICAL CARE IF: Your toes are numb or blue or you have severe pain. MAKE SURE YOU:   Understand these instructions.   Will watch your condition.   Will get help right away if you are not doing well or get worse.  Document Released: 07/04/2005 Document Revised: 06/23/2011 Document Reviewed: 02/06/2008 [facility] Patient Information 2012 [place], [place].

## 2011-10-28 NOTE — ED Notes (Signed)
MD at bedside. 

## 2011-10-28 NOTE — ED Notes (Signed)
Received report from [place] b.

## 2011-10-28 NOTE — ED Provider Notes (Signed)
History     CSN: [id number]  Arrival date & time 10/28/11  0911   First MD Initiated Contact with Patient 10/28/11 [phone]      Chief Complaint  Patient presents with  . Ankle Pain    (Consider location/radiation/quality/duration/timing/severity/associated sxs/prior treatment) HPI History provided by pt.   Pt developed non-radiating pain of right lateral ankle after mowing lawn 2 days ago.  Believes he may have twisted it.  Aggravated by bearing weight. Has taken aleve for pain w/ some relief.  No associated edema or paresthesias.    Past Medical History  Diagnosis Date  . ETOH abuse     quit    Past Surgical History  Procedure Date  . Hand surgery     No family history on file.  History  Substance Use Topics  . Smoking status: Current Everyday Smoker  . Smokeless tobacco: Not on file  . Alcohol Use: No     Everyday      Review of Systems  All other systems reviewed and are negative.    Allergies  Penicillins  Home Medications   Current Outpatient Rx  Name Route Sig Dispense Refill  . IBUPROFEN 200 MG PO TABS Oral Take 400 mg by mouth every 6 (six) hours as needed. For pain    . TRAMADOL HCL 50 MG PO TABS Oral Take 1 tablet (50 mg total) by mouth every 6 (six) hours as needed for pain. 12 tablet 0    BP 152/101  Pulse 71  Temp(Src) 98.1 F (36.7 C) (Oral)  Resp 18  SpO2 100%  Physical Exam  Nursing note and vitals reviewed. Constitutional: He is oriented to person, place, and time. He appears well-developed and well-nourished. No distress.  HENT:  Head: Normocephalic and atraumatic.  Eyes:       Normal appearance  Neck: Normal range of motion.  Musculoskeletal:       Right ankle w/out deformity, edema or ecchymosis.  Tenderness inferior and posterior to lateral malleolus; no bony tenderness.  Pain w/ passive ROM ankle in all directions.  2+ DP pulse.  Distal sensation intact.   Neurological: He is alert and oriented to person, place, and time.    Psychiatric: He has a normal mood and affect. His behavior is normal.    ED Course  Procedures (including critical care time)  Labs Reviewed - No data to display Dg Ankle 2 Views Right  10/28/2011  *RADIOLOGY REPORT*  Clinical Data: Ankle pain  RIGHT ANKLE - 2 VIEW  Comparison: None  Findings: Two views of the right ankle submitted.  No acute fracture or subluxation.  Ankle mortise is preserved.  IMPRESSION: No acute fracture or subluxation.  Original Report Authenticated By: Natasha Mead, M.D.     1. Sprain of right ankle       MDM  56yo M presents w/ right ankle injury.  S/sx most consistent w/ sprain.  Xray obtained by triage nurse and is neg for fx/dislocation.  Results discussed w/ pt.  Pt has crutches at home.  Ortho tech placed in ASO for comfort and pt d/c'd home w/ 12 tramadol and 800mg  ibuprofen.  Arie Sabina Karolyne Timmons, PA 10/28/11 [phone]

## 2011-10-28 NOTE — ED Notes (Signed)
Patient reports onset of right ankle pain x 2 days.  He denies trauma.  Denies gout.  He reports the ankle is swollen

## 2011-10-28 NOTE — ED Notes (Signed)
Ortho paged. 

## 2011-10-28 NOTE — ED Provider Notes (Signed)
Medical screening examination/treatment/procedure(s) were performed by non-physician practitioner and as supervising physician I was immediately available for consultation/collaboration.  Cheri Guppy, MD 10/28/11 1556

## 2012-02-21 ENCOUNTER — Encounter (HOSPITAL_COMMUNITY): Payer: Self-pay | Admitting: Emergency Medicine

## 2012-02-21 ENCOUNTER — Emergency Department (INDEPENDENT_AMBULATORY_CARE_PROVIDER_SITE_OTHER)
Admission: EM | Admit: 2012-02-21 | Discharge: 2012-02-21 | Disposition: A | Discharge status: Home / Self Care | Payer: Self-pay | Source: Home / Self Care | Attending: Emergency Medicine | Admitting: Emergency Medicine

## 2012-02-21 DIAGNOSIS — G5760 Lesion of plantar nerve, unspecified lower limb: Secondary | ICD-10-CM

## 2012-02-21 DIAGNOSIS — Z9114 Patient's other noncompliance with medication regimen: Secondary | ICD-10-CM

## 2012-02-21 DIAGNOSIS — Z9119 Patient's noncompliance with other medical treatment and regimen: Secondary | ICD-10-CM

## 2012-02-21 DIAGNOSIS — Z91148 Patient's other noncompliance with medication regimen for other reason: Secondary | ICD-10-CM

## 2012-02-21 DIAGNOSIS — I10 Essential (primary) hypertension: Secondary | ICD-10-CM

## 2012-02-21 HISTORY — DX: Cocaine abuse, uncomplicated: F14.10

## 2012-02-21 HISTORY — DX: Essential (primary) hypertension: I10

## 2012-02-21 LAB — POCT I-STAT, CHEM 8
BUN: 10 mg/dL (ref 6–23)
Creatinine, Ser: 1 mg/dL (ref 0.50–1.35)
Hemoglobin: 16.7 g/dL (ref 13.0–17.0)
Potassium: 4.4 mEq/L (ref 3.5–5.1)
Sodium: 144 mEq/L (ref 135–145)

## 2012-02-21 MED ORDER — IBUPROFEN 800 MG PO TABS
800.0000 mg | ORAL_TABLET | Freq: Once | ORAL | Status: AC
  Administered 2012-02-21: 800 mg via ORAL

## 2012-02-21 MED ORDER — MELOXICAM 15 MG PO TABS: 15.0000 mg | ORAL_TABLET | Freq: Every day | ORAL | Status: DC

## 2012-02-21 MED ORDER — IBUPROFEN 800 MG PO TABS
ORAL_TABLET | ORAL | Status: AC
  Filled 2012-02-21: qty 1

## 2012-02-21 MED ORDER — HYDROCODONE-ACETAMINOPHEN 5-325 MG PO TABS: 2.0000 | ORAL_TABLET | ORAL | Status: AC | PRN

## 2012-02-21 MED ORDER — PREDNISONE (PAK) 10 MG PO TABS: ORAL_TABLET | ORAL | Status: AC

## 2012-02-21 NOTE — ED Notes (Signed)
Notified dr Chaney Malling of patient's blood pressures.

## 2012-02-21 NOTE — ED Notes (Signed)
Patient reports he has been drinking energy drinks, coffee today

## 2012-02-21 NOTE — ED Notes (Addendum)
Reports 3 day history of left foot pain, secifically the ball of the foot.  No known injury.  Feels like a needle sticking into foot.  Patient reports he did not refill hctz and has been off of it for a month

## 2012-02-21 NOTE — ED Provider Notes (Signed)
History     CSN: [id number]  Arrival date & time 02/21/12  1603   First MD Initiated Contact with Patient 02/21/12 1627      Chief Complaint  Patient presents with  . Foot Pain    (Consider location/radiation/quality/duration/timing/severity/associated sxs/prior treatment) HPI Comments: Patient complains of plantar foot tenderness over the head of third metatarsal starting 2 days ago. He  states he does a "lot of walking", wearing a heavy combat boots. States he is having similar pains in his other foot. Denies any heel pain, pain along the plantar fascia. He is not a diabetic. He is a smoker.  Patient is a 56 y.o. male presenting with lower extremity pain and hypertension. No language interpreter was used.  Foot Pain This is a new problem. The current episode started 2 days ago. The problem occurs constantly. The problem has not changed since onset.Pertinent negatives include no chest pain, no abdominal pain, no headaches and no shortness of breath. The symptoms are aggravated by walking. Nothing relieves the symptoms. He has tried nothing for the symptoms. The treatment provided no relief.  Hypertension This is a chronic problem. The current episode started more than 1 week ago. The problem occurs constantly. The problem has not changed since onset.Pertinent negatives include no chest pain, no abdominal pain, no headaches and no shortness of breath. Nothing aggravates the symptoms. He has tried nothing for the symptoms. The treatment provided no relief.    Past Medical History  Diagnosis Date  . ETOH abuse     quit  . Hypertension   . Cocaine abuse     Past Surgical History  Procedure Date  . Hand surgery     History reviewed. No pertinent family history.  History  Substance Use Topics  . Smoking status: Current Everyday Smoker  . Smokeless tobacco: Not on file  . Alcohol Use: No     quite 2011      Review of Systems  Respiratory: Negative for shortness of breath.     Cardiovascular: Negative for chest pain.  Gastrointestinal: Negative for abdominal pain.  Neurological: Negative for headaches.    Allergies  Penicillins  Home Medications   Current Outpatient Rx  Name Route Sig Dispense Refill  . HYDROCHLOROTHIAZIDE 12.5 MG PO CAPS Oral Take 12.5 mg by mouth daily.    Marland Kitchen HYDROCODONE-ACETAMINOPHEN 5-325 MG PO TABS Oral Take 2 tablets by mouth every 4 (four) hours as needed for pain. 20 tablet 0  . MELOXICAM 15 MG PO TABS Oral Take 1 tablet (15 mg total) by mouth daily. 14 tablet 0  . PREDNISONE (PAK) 10 MG PO TABS  Dispense one 6 day pack. Take as directed with food. 21 tablet 0    BP 194/110  Pulse 63  Temp 97.7 F (36.5 C) (Oral)  Resp 18  SpO2 97% BP Readings from Last 3 Encounters:  02/21/12 194/110  10/28/11 164/108  10/21/11 130/110    Filed Vitals:   02/21/12 1802 02/21/12 1817 02/21/12 1819  BP: 188/121 193/112 194/110  Pulse: 66 63   Temp: 97.7 F (36.5 C)    TempSrc: Oral    Resp: 18    SpO2: 97%      Physical Exam  Nursing note and vitals reviewed. Constitutional: He is oriented to person, place, and time. He appears well-developed and well-nourished.  HENT:  Head: Normocephalic and atraumatic.  Eyes: Conjunctivae and EOM are normal.  Neck: Normal range of motion.  Cardiovascular: Normal rate.   Pulmonary/Chest: Effort  normal. No respiratory distress.  Abdominal: He exhibits no distension.  Musculoskeletal: Normal range of motion.       Feet:  Neurological: He is alert and oriented to person, place, and time. He has normal strength. No sensory deficit. Coordination and gait normal.  Skin: Skin is warm and dry.  Psychiatric: He has a normal mood and affect. His behavior is normal. Judgment and thought content normal.    ED Course  Procedures (including critical care time)  Labs Reviewed  POCT I-STAT, CHEM 8 - Abnormal; Notable for the following:    Calcium, Ion 1.24 (*)     All other components within normal  limits  LAB REPORT - SCANNED   No results found.   1. Morton neuroma   2. Hypertension   3. Noncompliance with medications     Results for orders placed during the hospital encounter of 02/21/12  POCT I-STAT, CHEM 8      Component Value Range   Sodium 144  135 - 145 mEq/L   Potassium 4.4  3.5 - 5.1 mEq/L   Chloride 101  96 - 112 mEq/L   BUN 10  6 - 23 mg/dL   Creatinine, Ser 0.98  0.50 - 1.35 mg/dL   Glucose, Bld 96  70 - 99 mg/dL   Calcium, Ion 1.19 (*) 1.12 - 1.23 mmol/L   TCO2 30  0 - 100 mmol/L   Hemoglobin 16.7  13.0 - 17.0 g/dL   HCT 14.7  82.9 - 56.2 %     MDM  Blood pressure noted.   has been significantly elevated on his past 3 visits.  states he has not taken his blood pressure medication "in months". Is currently asymptomatic. denies any symptoms of hypertensive emergency. Denies headache, visual changes, chest pain, shortness of breath, pain tearing through to his back or to his abdomen, anuria, hematuria, lower extremity edema. He denies cocaine or ETOH abuse, states that he quit 2 years ago. Denies taking other medication. States that he has plenty of hydrochlorothiazide available at [facility], and does not want another prescription for this. Discussed with him the importance of taking this on a regular basis. H&P is most consistent with a Morton's neuroma, no fracture. Deferring imaging. Will send home on steroids, NSAIDs if his kidney function is normal, ice, elevation, soft shoe inserts. He will need to f/u with podiatry if conservative treatment does not work. Discussed this and the importance of taking BP meds extensively with patient. Discussed sx/sn HTN emergency and gave strict return precuations. He agrees with plan.  Luiz Blare, MD 02/23/12 [phone]

## 2012-08-01 ENCOUNTER — Encounter (HOSPITAL_COMMUNITY): Payer: Self-pay

## 2012-08-01 ENCOUNTER — Emergency Department (HOSPITAL_COMMUNITY): Payer: Self-pay

## 2012-08-01 ENCOUNTER — Inpatient Hospital Stay (HOSPITAL_COMMUNITY)
Admission: EM | Admit: 2012-08-01 | Discharge: 2012-08-02 | DRG: 066 | Disposition: A | Discharge status: Home / Self Care | Payer: 59 | Attending: Internal Medicine | Admitting: Internal Medicine

## 2012-08-01 DIAGNOSIS — F1410 Cocaine abuse, uncomplicated: Secondary | ICD-10-CM | POA: Diagnosis present

## 2012-08-01 DIAGNOSIS — F17200 Nicotine dependence, unspecified, uncomplicated: Secondary | ICD-10-CM | POA: Diagnosis present

## 2012-08-01 DIAGNOSIS — I6350 Cerebral infarction due to unspecified occlusion or stenosis of unspecified cerebral artery: Principal | ICD-10-CM | POA: Diagnosis present

## 2012-08-01 DIAGNOSIS — I639 Cerebral infarction, unspecified: Secondary | ICD-10-CM

## 2012-08-01 DIAGNOSIS — R4789 Other speech disturbances: Secondary | ICD-10-CM

## 2012-08-01 DIAGNOSIS — Z79899 Other long term (current) drug therapy: Secondary | ICD-10-CM

## 2012-08-01 DIAGNOSIS — Z9119 Patient's noncompliance with other medical treatment and regimen: Secondary | ICD-10-CM

## 2012-08-01 DIAGNOSIS — Z8673 Personal history of transient ischemic attack (TIA), and cerebral infarction without residual deficits: Secondary | ICD-10-CM | POA: Diagnosis present

## 2012-08-01 DIAGNOSIS — I10 Essential (primary) hypertension: Secondary | ICD-10-CM | POA: Diagnosis present

## 2012-08-01 DIAGNOSIS — R29810 Facial weakness: Secondary | ICD-10-CM

## 2012-08-01 DIAGNOSIS — I6789 Other cerebrovascular disease: Secondary | ICD-10-CM

## 2012-08-01 DIAGNOSIS — Z91199 Patient's noncompliance with other medical treatment and regimen due to unspecified reason: Secondary | ICD-10-CM

## 2012-08-01 DIAGNOSIS — R5381 Other malaise: Secondary | ICD-10-CM

## 2012-08-01 DIAGNOSIS — E785 Hyperlipidemia, unspecified: Secondary | ICD-10-CM | POA: Diagnosis present

## 2012-08-01 LAB — COMPREHENSIVE METABOLIC PANEL
ALT: 16 U/L (ref 0–53)
AST: 22 U/L (ref 0–37)
Albumin: 3.6 g/dL (ref 3.5–5.2)
Calcium: 8.8 mg/dL (ref 8.4–10.5)
GFR calc Af Amer: >90 mL/min (ref >90)
Potassium: 3.9 mEq/L (ref 3.5–5.1)
Sodium: 139 mEq/L (ref 135–145)
Total Protein: 6.6 g/dL (ref 6.0–8.3)

## 2012-08-01 LAB — POCT I-STAT, CHEM 8
Calcium, Ion: 1.17 mmol/L (ref 1.12–1.23)
Glucose, Bld: 100 mg/dL — ABNORMAL HIGH (ref 70–99)
HCT: 47 % (ref 39.0–52.0)
Hemoglobin: 16 g/dL (ref 13.0–17.0)
TCO2: 29 mmol/L (ref 0–100)

## 2012-08-01 LAB — CBC WITH DIFFERENTIAL/PLATELET
Basophils Relative: 1 % (ref 0–1)
Eosinophils Absolute: 0.2 10*3/uL (ref 0.0–0.7)
HCT: 44.6 % (ref 39.0–52.0)
Hemoglobin: 15.2 g/dL (ref 13.0–17.0)
MCH: 30.2 pg (ref 26.0–34.0)
MCHC: 34.1 g/dL (ref 30.0–36.0)
Monocytes Absolute: 0.2 10*3/uL (ref 0.1–1.0)
Monocytes Relative: 7 % (ref 3–12)
Neutrophils Relative %: 44 % (ref 43–77)

## 2012-08-01 LAB — RAPID URINE DRUG SCREEN, HOSP PERFORMED
Barbiturates: NOT DETECTED
Opiates: NOT DETECTED
Tetrahydrocannabinol: NOT DETECTED

## 2012-08-01 LAB — URINALYSIS, ROUTINE W REFLEX MICROSCOPIC
Bilirubin Urine: NEGATIVE
Leukocytes, UA: NEGATIVE
Nitrite: NEGATIVE
Specific Gravity, Urine: 1.022 (ref 1.005–1.030)
Urobilinogen, UA: 1 mg/dL (ref 0.0–1.0)

## 2012-08-01 LAB — HEMOGLOBIN A1C: Mean Plasma Glucose: 131 mg/dL — ABNORMAL HIGH (ref <117)

## 2012-08-01 LAB — LIPID PANEL
LDL Cholesterol: 116 mg/dL — ABNORMAL HIGH (ref 0–99)
VLDL: 10 mg/dL (ref 0–40)

## 2012-08-01 LAB — TROPONIN I: Troponin I: <0.3 ng/mL (ref <0.30)

## 2012-08-01 LAB — PROTIME-INR: INR: 1.04 (ref 0.00–1.49)

## 2012-08-01 MED ORDER — ACETAMINOPHEN 325 MG PO TABS: 650.0000 mg | ORAL_TABLET | Freq: Four times a day (QID) | ORAL | Status: DC | PRN

## 2012-08-01 MED ORDER — HEPARIN SODIUM (PORCINE) 5000 UNIT/ML IJ SOLN
5000.0000 [IU] | Freq: Three times a day (TID) | INTRAMUSCULAR | Status: DC
  Administered 2012-08-01: 5000 [IU] via SUBCUTANEOUS
  Filled 2012-08-01 (×6): qty 1

## 2012-08-01 MED ORDER — SIMVASTATIN 20 MG PO TABS
20.0000 mg | ORAL_TABLET | Freq: Every day | ORAL | Status: DC
  Administered 2012-08-01 – 2012-08-02 (×2): 20 mg via ORAL
  Filled 2012-08-01 (×2): qty 1

## 2012-08-01 MED ORDER — ONDANSETRON HCL 4 MG/2ML IJ SOLN: 4.0000 mg | Freq: Four times a day (QID) | INTRAMUSCULAR | Status: DC | PRN

## 2012-08-01 MED ORDER — GADOBENATE DIMEGLUMINE 529 MG/ML IV SOLN
15.0000 mL | Freq: Once | INTRAVENOUS | Status: AC
  Administered 2012-08-01: 14 mL via INTRAVENOUS

## 2012-08-01 MED ORDER — ASPIRIN EC 81 MG PO TBEC
81.0000 mg | DELAYED_RELEASE_TABLET | Freq: Every day | ORAL | Status: DC
  Administered 2012-08-02: 81 mg via ORAL
  Filled 2012-08-01 (×2): qty 1

## 2012-08-01 MED ORDER — ASPIRIN 300 MG RE SUPP
300.0000 mg | Freq: Once | RECTAL | Status: AC
  Administered 2012-08-01: 300 mg via RECTAL
  Filled 2012-08-01: qty 1

## 2012-08-01 MED ORDER — ASPIRIN 81 MG PO CHEW
324.0000 mg | CHEWABLE_TABLET | Freq: Once | ORAL | Status: DC
  Filled 2012-08-01: qty 4

## 2012-08-01 MED ORDER — ONDANSETRON HCL 8 MG PO TABS
4.0000 mg | ORAL_TABLET | Freq: Four times a day (QID) | ORAL | Status: DC | PRN
  Filled 2012-08-01: qty 0.5

## 2012-08-01 MED ORDER — STROKE: EARLY STAGES OF RECOVERY BOOK
Freq: Once | Status: AC
  Administered 2012-08-01: 22:00:00
  Filled 2012-08-01: qty 1

## 2012-08-01 MED ORDER — ACETAMINOPHEN 650 MG RE SUPP: 650.0000 mg | Freq: Four times a day (QID) | RECTAL | Status: DC | PRN

## 2012-08-01 NOTE — ED Notes (Signed)
Pt to ed by ems from home.  Pt c/o left sided weakness since this am.  Pt states that he went to bed last night and was fine but then when he woke up this am he noticed left sided weakness/numbness.

## 2012-08-01 NOTE — ED Provider Notes (Signed)
History  This chart was scribed for Christopher Octave, MD by Bennett Scrape, ED Scribe. This patient was seen in room A09C/A09C and the patient's care was started at 6:56 AM.  CSN: [id number]  Arrival date & time 08/01/12  4098   First MD Initiated Contact with Patient 08/01/12 [phone]      Chief Complaint  Patient presents with  . Weakness    The history is provided by the patient. No language interpreter was used.    [name] is a 57 y.o. male brought in by ambulance, who presents to the Emergency Department complaining of 3 hours of sudden onset, intermittently felt left-sided weakness that he woke up with associated left-sided numbness and slurred speech. Pt states that he woke up at 4 AM with weakness, went back to sleep and woke up with improvement. He states that he was able to walk to the bus stop where the weakness and slurred speech started. He states that he had no symptoms last night. He denies having prior episodes of similar symptoms or previous CVA diagnoses. He denies CP, HA, nausea, and abdominal pain as associated symptoms. He has a h/o HTN and cocaine abuse but denies any cocaine use recently. He is a current everyday smoker but denies alcohol use.  Past Medical History  Diagnosis Date  . ETOH abuse     quit  . Hypertension   . Cocaine abuse     Past Surgical History  Procedure Date  . Hand surgery     No family history on file.  History  Substance Use Topics  . Smoking status: Current Every Day Smoker  . Smokeless tobacco: Not on file  . Alcohol Use: No     Comment: quite 2011      Review of Systems  A complete 10 system review of systems was obtained and all systems are negative except as noted in the HPI and PMH.   Allergies  Penicillins  Home Medications   Current Outpatient Rx  Name  Route  Sig  Dispense  Refill  . HYDROCHLOROTHIAZIDE 12.5 MG PO CAPS   Oral   Take 12.5 mg by mouth daily.         . MELOXICAM 15 MG PO TABS   Oral   Take 1 tablet (15 mg total) by mouth daily.   14 tablet   0     Triage Vitals: BP 186/115  Pulse 73  Resp 16  SpO2 98%  Physical Exam  Nursing note and vitals reviewed. Constitutional: He is oriented to person, place, and time. He appears well-developed and well-nourished. No distress.  HENT:  Head: Normocephalic and atraumatic.  Eyes: Conjunctivae normal and EOM are normal.  Neck: Neck supple. No tracheal deviation present.  Cardiovascular: Normal rate and regular rhythm.   Pulmonary/Chest: Effort normal and breath sounds normal. No respiratory distress.  Abdominal: Soft. There is no tenderness.  Musculoskeletal: Normal range of motion.  Neurological: He is alert and oriented to person, place, and time.       Left facial droop, nasolabial fold flattening on the left, flaccid left side, unable to hold arm or leg against gravity, slurred speech  Skin: Skin is warm and dry.  Psychiatric: He has a normal mood and affect. His behavior is normal.    ED Course  Procedures (including critical care time)  DIAGNOSTIC STUDIES: Oxygen Saturation is 98% on room air, normal by my interpretation.    COORDINATION OF CARE: 7:00 AM- Pt was assessed for  a stroke.  7:05 AM- Discussed treatment plan which includes an assessment by neurology and pt agreed.  7:06 AM- Neurology at bedside to assess the pt.  7:09 AM- Pt is moving arm at bedside. Neurology does believes this is a small vessel stroke.  8:15 AM- Ordered 300 mg ASA suppository  8:25 AM- Consult complete with Dr. Anselm Jungling, internal medicine. Patient case explained and discussed. Dr. Anselm Jungling agrees to admit patient for further evaluation and treatment. Call ended at 8:26 AM.   Labs Reviewed  CBC  COMPREHENSIVE METABOLIC PANEL  APTT  PROTIME-INR  TROPONIN I  URINALYSIS, ROUTINE W REFLEX MICROSCOPIC  ETHANOL  DIFFERENTIAL  URINE RAPID DRUG SCREEN (HOSP PERFORMED)   Ct Head Wo Contrast  08/01/2012  *RADIOLOGY REPORT*  Clinical Data:  Progressive left-sided weakness.  Unable to walk. Stroke symptoms.  CT HEAD WITHOUT CONTRAST  Technique:  Contiguous axial images were obtained from the base of the skull through the vertex without contrast.  Comparison: 07/09/2005  Findings: The ventricles and sulci are symmetrical without significant effacement, displacement, or dilatation. No mass effect or midline shift. No abnormal extra-axial fluid collections.  There are patchy low attenuation changes in the deep white matter most consistent with small vessel ischemic change.  Old lacunar infarct in the anterior left centrum [place].  No definitive changes of acute infarct.  The grey-white matter junction is distinct. Basal cisterns are not effaced. No acute intracranial hemorrhage. No depressed skull fractures. Visualized portions of the paranasal sinuses demonstrate mucosal membrane thickening in the ethmoid sinuses.  No acute air-fluid levels.  Mastoid air cells are not opacified.  Vascular calcifications.  IMPRESSION: No acute intracranial abnormalities demonstrated.  Results telephoned to Dr. Fonnie Jarvis at [phone] hours on 08/01/2012.   Original Report Authenticated By: Burman Nieves, M.D.      No diagnosis found.    MDM  Intermittent left-sided weakness that started this morning. Awoke around 4 AM with weakness which improved and return around 6 AM. On initial evaluation has left-sided facial droop with flaccid left upper and lower extremities. Code stroke activated.  Left-sided weakness improved while in the ED. CT negative. Neurology evaluated the patient and felt his symptoms are due to a small vessel stroke. He is not a candidate for TPA given delayed presentation and rapidly improving symptoms.  MRI ordered. Patient will need admission for stroke workup.   Date: 08/01/2012  Rate: 60  Rhythm: normal sinus rhythm  QRS Axis: normal  Intervals: normal  ST/T Wave abnormalities: normal  Conduction Disutrbances:none  Narrative  Interpretation:   Old EKG Reviewed: none available     I personally performed the services described in this documentation, which was scribed in my presence. The recorded information has been reviewed and is accurate.    Christopher Octave, MD 08/01/12 [phone]

## 2012-08-01 NOTE — ED Notes (Signed)
Pt in MRI.

## 2012-08-01 NOTE — ED Notes (Signed)
Back to room with physician at bedside.  Pt unable to move left leg against gravity also noted left facial drooping.  Code stroke was called at [phone] based statement from pt changing.  Neurology at bedside 0702. Pt now able to move left leg freely and no facial weakness noted.  Neurology cancelled code stroke at 0710.

## 2012-08-01 NOTE — Progress Notes (Signed)
  Echocardiogram 2D Echocardiogram has been performed.  [name] A 08/01/2012, 3:48 PM

## 2012-08-01 NOTE — H&P (Signed)
Hospital Admission Note Date: 08/01/2012  Patient name: [name] Medical record number: [id number] Date of birth: 1956-03-18 Age: 57 y.o. Gender: male PCP: Default, Provider, MD  Medical Service: Internal Medicine Teaching Service  Attending physician:  Dr. Meredith Pel    1st Contact:  Dr. Heloise Beecham   Pager: [phone] 2nd Contact:  Dr. Bosie Clos   Pager:[phone] After 5 pm or weekends: 1st Contact:      Pager: [phone] 2nd Contact:      Pager: [phone]  Chief Complaint: left sided weakness,slurred speech, facial droop  History of Present Illness: Mr. Stiehl is a 57 yo M with PMH of cocaine abuse and HTN (has not been taking medications for years--used to take HCTZ) presents to the ED for left sided weakness when he woke up at [facility] on day of admission. He was normal when he went to bed at 9:30PM.  He states that he felt weak on his left side was unable to raise his left arm or leg.  He then went back to bed and then woke up at 5:30AM when he experience the left sided weakness again.  He states that the episodes last about 10-15 mins, intermittent.  He thought he was able to go to work so he walked to the bus stop and started to feel the symptom again in addition to slurred speech and left facial droop.  He denies any headache, visual changes.  He did have some neck numbness/ with pain that radiates down his shoulder blades in the last few weeks and has been taking some arthritis medicine which now resolved. He denies any chest pain, SOB, fever, N/V, abd pain.  He still smokes 1pack every 3 days x 30 years.  Has not used cocaine in the past 4 years.  He has not been on ASA in the past.  No personal history of stroke or family history of stroke.  Meds: NONE  Allergies: Allergies as of 08/01/2012 - Review Complete 08/01/2012  Allergen Reaction Noted  . Penicillins Other (See Comments) 03/10/2011   Past Medical History  Diagnosis Date  . ETOH abuse     quit  . Hypertension   . Cocaine abuse     Past Surgical History  Procedure Date  . Hand surgery    Family history: HTN, DM, father had bypass at age 32  History   Social History  . Marital Status: divorced    Spouse Name: N/A    Number of Children: N/A  . Years of Education: N/A   Occupational History  . Not on file.   Social History Main Topics  . Smoking status: Current Every Day Smoker 1p every 3 days x 30 yrs  . Smokeless tobacco: Not on file  . Alcohol Use: No     Comment: quite 2011  . Drug Use: No     Comment: h/o cocaine abuse  . Sexually Active:    He works for [employer], has 3 healthy children.    Review of Systems: Constitutional: Denies fever, chills, diaphoresis, appetite change and fatigue.  HEENT: Denies photophobia, eye pain, redness, hearing loss, ear pain, congestion, sore throat, rhinorrhea, sneezing, mouth sores, trouble swallowing, had neck pain but now resolved, neck stiffness and tinnitus.  Respiratory: Denies SOB, DOE, cough, chest tightness, and wheezing.  Cardiovascular: Denies chest pain, palpitations and leg swelling.  Gastrointestinal: Denies nausea, vomiting, abdominal pain, diarrhea, constipation, blood in stool and abdominal distention.  Genitourinary: Denies dysuria, urgency, frequency, hematuria, flank pain and difficulty urinating.  Musculoskeletal: Denies myalgias, back pain, joint swelling, arthralgias and gait problem.  Skin: Denies pallor, rash and wound.  Neurological: Denies dizziness, seizures, syncope,+ weakness, light-headedness, +numbness and headaches.  Hematological: Denies adenopathy. Easy bruising, personal or family bleeding history  Psychiatric/Behavioral: Denies suicidal ideation, mood changes, confusion, nervousness, sleep disturbance and agitation  Physical Exam: Blood pressure 156/113, pulse 64, temperature 97.8 F (36.6 C), temperature source Oral, resp. rate 18, SpO2 99.00%. General: alert, well-developed, and cooperative to examination.  Head:  normocephalic and atraumatic.  Eyes: vision grossly intact, pupils equal, pupils round, pupils reactive to light, no injection and anicteric.  Mouth: pharynx pink and moist, no erythema, and no exudates.  Neck: supple, full ROM, no thyromegaly, no JVD, and no carotid bruits.  Lungs: normal respiratory effort, no accessory muscle use, normal breath sounds, no crackles, and no wheezes. Heart: normal rate, regular rhythm, no murmur, no gallop, and no rub.  Abdomen: soft, non-tender, normal bowel sounds, no distention, no guarding, no rebound tenderness, no hepatomegaly, and no splenomegaly.  Msk: no joint swelling, no joint warmth, and no redness over joints.  Pulses: 2+ DP/PT pulses bilaterally Extremities: No cyanosis, clubbing, edema Neurologic: alert & oriented X3, tongue midline, +left facial droop, +slurred speech intermittently during examination, Left Upper extremity 3-4/5, Left lower extremity: 4/5.  Not able to do handgrip on left hand.  Normal rapid alternating movement bilaterally and normal finger to nose and heel to shin.  Toes downgoing bilaterally.     Psych: Oriented X3, memory intact for recent and remote, normally interactive, good eye contact, not anxious appearing, and not depressed appearing.   Lab results: Basic Metabolic Panel:  Basename 08/01/12 0727 08/01/12 0709  NA 141 139  K 3.9 3.9  CL 102 103  CO2 -- 28  GLUCOSE 100* 103*  BUN 17 18  CREATININE 0.90 0.67  CALCIUM -- 8.8  MG -- --  PHOS -- --   Liver Function Tests:  [facility] 08/01/12 0709  AST 22  ALT 16  ALKPHOS 69  BILITOT 0.3  PROT 6.6  ALBUMIN 3.6   CBC:  Basename 08/01/12 0727 08/01/12 0709  WBC -- 3.5*  NEUTROABS -- 1.6*  HGB 16.0 15.2  HCT 47.0 44.6  MCV -- 88.7  PLT -- 220   Cardiac Enzymes:  Basename 08/01/12 0710  CKTOTAL --  CKMB --  CKMBINDEX --  TROPONINI <0.30   CBG:  Basename 08/01/12 0718  GLUCAP 91   Fasting Lipid Panel:  Basename 08/01/12 0710  CHOL 182    HDL 56  LDLCALC 116*  TRIG 50  CHOLHDL 3.3  LDLDIRECT --   Coagulation:  Basename 08/01/12 0709  LABPROT 13.5  INR 1.04   Urine Drug Screen: Drugs of Abuse     Component Value Date/Time   LABOPIA NONE DETECTED 08/01/2012 0941   LABOPIA NEG 08/26/2008 0238   COCAINSCRNUR NONE DETECTED 08/01/2012 0941   COCAINSCRNUR NEG 08/26/2008 0238   LABBENZ NONE DETECTED 08/01/2012 0941   LABBENZ NEG 08/26/2008 0238   AMPHETMU NONE DETECTED 08/01/2012 0941   AMPHETMU NEG 08/26/2008 0238   THCU NONE DETECTED 08/01/2012 0941   LABBARB NONE DETECTED 08/01/2012 0941    Imaging results:  Ct Head Wo Contrast  08/01/2012  *RADIOLOGY REPORT*  Clinical Data: Progressive left-sided weakness.  Unable to walk. Stroke symptoms.  CT HEAD WITHOUT CONTRAST  Technique:  Contiguous axial images were obtained from the base of the skull through the vertex without contrast.  Comparison: 07/09/2005  Findings: The ventricles  and sulci are symmetrical without significant effacement, displacement, or dilatation. No mass effect or midline shift. No abnormal extra-axial fluid collections.  There are patchy low attenuation changes in the deep white matter most consistent with small vessel ischemic change.  Old lacunar infarct in the anterior left centrum [place].  No definitive changes of acute infarct.  The grey-white matter junction is distinct. Basal cisterns are not effaced. No acute intracranial hemorrhage. No depressed skull fractures. Visualized portions of the paranasal sinuses demonstrate mucosal membrane thickening in the ethmoid sinuses.  No acute air-fluid levels.  Mastoid air cells are not opacified.  Vascular calcifications.  IMPRESSION: No acute intracranial abnormalities demonstrated.  Results telephoned to Dr. Fonnie Jarvis at [phone] hours on 08/01/2012.   Original Report Authenticated By: Burman Nieves, M.D.    Mr [facility] Wo Contrast  08/01/2012  *RADIOLOGY REPORT*  Clinical Data:  57 year old male with acute onset  left extremity weakness with stuttering course, now improved.  Contrast: 14mL MULTIHANCE GADOBENATE DIMEGLUMINE 529 MG/ML IV SOLN  Comparison: Head CTs 07/09/2005.  MRI HEAD WITHOUT AND WITH CONTRAST  Technique: Multiplanar, multiecho pulse sequences of the brain and surrounding structures were obtained according to standard protocol without and with intravenous contrast.  Findings:  Linear area of restricted diffusion posterior limb right internal capsule (series 4 image 15, series 400 image 15).  Faint associated T2 hyperintensity.  No mass effect or associated hemorrhage.  No other restricted diffusion identified. Major intracranial vascular flow voids are preserved, MRA findings are below.  Scattered dilated perivascular spaces.  Scattered T2 and FLAIR hyperintensity in the cerebral white matter, patchy and confluent periatrial involvement.  Evidence of chronic left dorsal thalamus lacunar infarct.  Brainstem, cerebellum, and visualized cervical spine within normal limits.  Negative pituitary.  No cortical encephalomalacia. No abnormal enhancement identified.  Visualized orbit soft tissues are within normal limits.  Minor paranasal sinus mucosal thickening.  Mastoids are clear.  Normal bone marrow signal.  Negative scalp soft tissues.  IMPRESSION: 1.  Acute lacunar type infarct posterior limb right internal capsule.  No mass effect or hemorrhage. 2.  Underlying signal changes suggestive of chronic small vessel disease. 3.  MRA findings below.  MRA NECK WITHOUT AND WITH CONTRAST  Technique:  Angiographic images of the neck were obtained using MRA technique without and with intravenous contrast.  Carotid stenosis measurements (when applicable) are obtained utilizing NASCET criteria, using the distal internal carotid diameter as the denominator.  Findings:  Precontrast time-of-flight images demonstrate antegrade flow in both carotid and vertebral arteries.  Tortuous vertebral arteries.  Partially retropharyngeal  course of the left carotid artery.  Continued antegrade flow to the skull base.  Postcontrast MRA images demonstrate a three-vessel arch configuration.  Great vessel origins appear within normal limits.  Normal right common carotid artery.  At the right carotid bifurcation there is irregularity at the right ICA origin and posterior bulb.  No significant stenosis occurs.  Cervical right ICA normal aside from mild tortuosity.  Left common carotid artery within normal limits.  Irregularity at the left carotid bifurcation involving the medial left ICA origin and bulb.  Still, no significant stenosis occurs.  Cervical left ICA normal elsewhere except for tortuosity.  Codominant vertebral arteries with intermittent tortuosity in the neck.  Both vertebral artery origins are within normal limits.  No cervical vertebral artery stenosis.  IMPRESSION: 1.  Bilateral carotid bifurcation atherosclerosis but no significant stenosis results. 2.  Tortuosity. 3.  Otherwise negative neck MRA.  See intracranial findings  below.  MRA HEAD WITHOUT CONTRAST  Technique: Angiographic images of the Circle of Willis were obtained using MRA technique without  intravenous contrast.  Findings:  Study is intermittently degraded by motion artifact despite repeated imaging attempts.  Antegrade flow in the posterior circulation.  Codominant distal vertebral arteries.  Normal right PICA.  Dominant left AICA. Normal vertebrobasilar junction.  No basilar stenosis.  SCA and left PCA origins are within normal limits.  Fetal right PCA origin. Small left posterior communicating artery also present.  Bilateral PCA branches are within normal limits.  Antegrade flow in both ICA siphons. Mild ICA irregularity.  No ICA stenosis.  Posterior communicating artery origins are within normal limits.  Carotid termini are patent.  MCA and ACA origins are within normal limits.  Anterior communicating artery within normal limits.  Visualized ACA branches within normal  limits.  Visualized left MCA branches within normal limits.  Mild irregularity of the right MCA M1 segment.  Both MCA demonstrate fairly early branching.  Visualized right MCA branches are within normal limits.  IMPRESSION: 1.  Mild irregularity at the right MCA M1 segment.  Otherwise negative anterior circulation allowing for a degree of motion artifact. 2.  Negative posterior circulation.   Original Report Authenticated By: Erskine Speed, M.D.    Mr Angiogram Neck W Wo Contrast  08/01/2012  *RADIOLOGY REPORT*  Clinical Data:  57 year old male with acute onset left extremity weakness with stuttering course, now improved.  Contrast: 14mL MULTIHANCE GADOBENATE DIMEGLUMINE 529 MG/ML IV SOLN  Comparison: Head CTs 07/09/2005.  MRI HEAD WITHOUT AND WITH CONTRAST  Technique: Multiplanar, multiecho pulse sequences of the brain and surrounding structures were obtained according to standard protocol without and with intravenous contrast.  Findings:  Linear area of restricted diffusion posterior limb right internal capsule (series 4 image 15, series 400 image 15).  Faint associated T2 hyperintensity.  No mass effect or associated hemorrhage.  No other restricted diffusion identified. Major intracranial vascular flow voids are preserved, MRA findings are below.  Scattered dilated perivascular spaces.  Scattered T2 and FLAIR hyperintensity in the cerebral white matter, patchy and confluent periatrial involvement.  Evidence of chronic left dorsal thalamus lacunar infarct.  Brainstem, cerebellum, and visualized cervical spine within normal limits.  Negative pituitary.  No cortical encephalomalacia. No abnormal enhancement identified.  Visualized orbit soft tissues are within normal limits.  Minor paranasal sinus mucosal thickening.  Mastoids are clear.  Normal bone marrow signal.  Negative scalp soft tissues.  IMPRESSION: 1.  Acute lacunar type infarct posterior limb right internal capsule.  No mass effect or hemorrhage. 2.   Underlying signal changes suggestive of chronic small vessel disease. 3.  MRA findings below.  MRA NECK WITHOUT AND WITH CONTRAST  Technique:  Angiographic images of the neck were obtained using MRA technique without and with intravenous contrast.  Carotid stenosis measurements (when applicable) are obtained utilizing NASCET criteria, using the distal internal carotid diameter as the denominator.  Findings:  Precontrast time-of-flight images demonstrate antegrade flow in both carotid and vertebral arteries.  Tortuous vertebral arteries.  Partially retropharyngeal course of the left carotid artery.  Continued antegrade flow to the skull base.  Postcontrast MRA images demonstrate a three-vessel arch configuration.  Great vessel origins appear within normal limits.  Normal right common carotid artery.  At the right carotid bifurcation there is irregularity at the right ICA origin and posterior bulb.  No significant stenosis occurs.  Cervical right ICA normal aside from mild tortuosity.  Left common carotid  artery within normal limits.  Irregularity at the left carotid bifurcation involving the medial left ICA origin and bulb.  Still, no significant stenosis occurs.  Cervical left ICA normal elsewhere except for tortuosity.  Codominant vertebral arteries with intermittent tortuosity in the neck.  Both vertebral artery origins are within normal limits.  No cervical vertebral artery stenosis.  IMPRESSION: 1.  Bilateral carotid bifurcation atherosclerosis but no significant stenosis results. 2.  Tortuosity. 3.  Otherwise negative neck MRA.  See intracranial findings below.  MRA HEAD WITHOUT CONTRAST  Technique: Angiographic images of the Circle of Willis were obtained using MRA technique without  intravenous contrast.  Findings:  Study is intermittently degraded by motion artifact despite repeated imaging attempts.  Antegrade flow in the posterior circulation.  Codominant distal vertebral arteries.  Normal right PICA.   Dominant left AICA. Normal vertebrobasilar junction.  No basilar stenosis.  SCA and left PCA origins are within normal limits.  Fetal right PCA origin. Small left posterior communicating artery also present.  Bilateral PCA branches are within normal limits.  Antegrade flow in both ICA siphons. Mild ICA irregularity.  No ICA stenosis.  Posterior communicating artery origins are within normal limits.  Carotid termini are patent.  MCA and ACA origins are within normal limits.  Anterior communicating artery within normal limits.  Visualized ACA branches within normal limits.  Visualized left MCA branches within normal limits.  Mild irregularity of the right MCA M1 segment.  Both MCA demonstrate fairly early branching.  Visualized right MCA branches are within normal limits.  IMPRESSION: 1.  Mild irregularity at the right MCA M1 segment.  Otherwise negative anterior circulation allowing for a degree of motion artifact. 2.  Negative posterior circulation.   Original Report Authenticated By: Erskine Speed, M.D.    Mr Laqueta Jean Wo Contrast  08/01/2012  *RADIOLOGY REPORT*  Clinical Data:  57 year old male with acute onset left extremity weakness with stuttering course, now improved.  Contrast: 14mL MULTIHANCE GADOBENATE DIMEGLUMINE 529 MG/ML IV SOLN  Comparison: Head CTs 07/09/2005.  MRI HEAD WITHOUT AND WITH CONTRAST  Technique: Multiplanar, multiecho pulse sequences of the brain and surrounding structures were obtained according to standard protocol without and with intravenous contrast.  Findings:  Linear area of restricted diffusion posterior limb right internal capsule (series 4 image 15, series 400 image 15).  Faint associated T2 hyperintensity.  No mass effect or associated hemorrhage.  No other restricted diffusion identified. Major intracranial vascular flow voids are preserved, MRA findings are below.  Scattered dilated perivascular spaces.  Scattered T2 and FLAIR hyperintensity in the cerebral white matter, patchy  and confluent periatrial involvement.  Evidence of chronic left dorsal thalamus lacunar infarct.  Brainstem, cerebellum, and visualized cervical spine within normal limits.  Negative pituitary.  No cortical encephalomalacia. No abnormal enhancement identified.  Visualized orbit soft tissues are within normal limits.  Minor paranasal sinus mucosal thickening.  Mastoids are clear.  Normal bone marrow signal.  Negative scalp soft tissues.  IMPRESSION: 1.  Acute lacunar type infarct posterior limb right internal capsule.  No mass effect or hemorrhage. 2.  Underlying signal changes suggestive of chronic small vessel disease. 3.  MRA findings below.  MRA NECK WITHOUT AND WITH CONTRAST  Technique:  Angiographic images of the neck were obtained using MRA technique without and with intravenous contrast.  Carotid stenosis measurements (when applicable) are obtained utilizing NASCET criteria, using the distal internal carotid diameter as the denominator.  Findings:  Precontrast time-of-flight images demonstrate antegrade flow in  both carotid and vertebral arteries.  Tortuous vertebral arteries.  Partially retropharyngeal course of the left carotid artery.  Continued antegrade flow to the skull base.  Postcontrast MRA images demonstrate a three-vessel arch configuration.  Great vessel origins appear within normal limits.  Normal right common carotid artery.  At the right carotid bifurcation there is irregularity at the right ICA origin and posterior bulb.  No significant stenosis occurs.  Cervical right ICA normal aside from mild tortuosity.  Left common carotid artery within normal limits.  Irregularity at the left carotid bifurcation involving the medial left ICA origin and bulb.  Still, no significant stenosis occurs.  Cervical left ICA normal elsewhere except for tortuosity.  Codominant vertebral arteries with intermittent tortuosity in the neck.  Both vertebral artery origins are within normal limits.  No cervical  vertebral artery stenosis.  IMPRESSION: 1.  Bilateral carotid bifurcation atherosclerosis but no significant stenosis results. 2.  Tortuosity. 3.  Otherwise negative neck MRA.  See intracranial findings below.  MRA HEAD WITHOUT CONTRAST  Technique: Angiographic images of the Circle of Willis were obtained using MRA technique without  intravenous contrast.  Findings:  Study is intermittently degraded by motion artifact despite repeated imaging attempts.  Antegrade flow in the posterior circulation.  Codominant distal vertebral arteries.  Normal right PICA.  Dominant left AICA. Normal vertebrobasilar junction.  No basilar stenosis.  SCA and left PCA origins are within normal limits.  Fetal right PCA origin. Small left posterior communicating artery also present.  Bilateral PCA branches are within normal limits.  Antegrade flow in both ICA siphons. Mild ICA irregularity.  No ICA stenosis.  Posterior communicating artery origins are within normal limits.  Carotid termini are patent.  MCA and ACA origins are within normal limits.  Anterior communicating artery within normal limits.  Visualized ACA branches within normal limits.  Visualized left MCA branches within normal limits.  Mild irregularity of the right MCA M1 segment.  Both MCA demonstrate fairly early branching.  Visualized right MCA branches are within normal limits.  IMPRESSION: 1.  Mild irregularity at the right MCA M1 segment.  Otherwise negative anterior circulation allowing for a degree of motion artifact. 2.  Negative posterior circulation.   Original Report Authenticated By: Erskine Speed, M.D.    Dg Chest Port 1 View  08/01/2012  *RADIOLOGY REPORT*  Clinical Data: Left-sided weakness  PORTABLE CHEST - 1 VIEW  Comparison: 10/31/2011; 08/22/2009; 09/18/2008  Findings: Grossly unchanged cardiac silhouette and mediastinal contours with mild tortuosity of the thoracic aorta.  No focal airspace opacities.  No definite pleural effusion, though note, the  bilateral costophrenic angles are excluded from view.  No pneumothorax.  Unchanged bones including old/healed left-sided eighth rib fracture.  IMPRESSION: No acute cardiopulmonary disease.   Original Report Authenticated By: Tacey Ruiz, MD     Other results: EKG: sinus rhythm, normal axis, poor R wave progression and non specific ST changes  Assessment & Plan by Problem:  1. Left-sided weakness/slurred speech/facial droop: Patient's clinical presentation is consistent with acute CVA.  This is likely ischemic stroke caused by small subcortical infarcts given his risk factors of HTN and smoking and probably hyperlipidemia.   Hemorrhagic stroke is ruled out by negative CT of head. Etiologies of ischemic stroke include cardioembolic (less likely), large artery atherosclerosis.  Atherosclerosis of the large cerebral arteries such as the left MCA may cause stroke by formation of artery-to-artery emboli and it is common in persons with African ancestry and the risk of  recurrent stroke is common if the stenosis is >70% and BP is greater than 140/80 mmHg.  His NIHSS score is 2(moderate aphasia and dysarthria). Patient is not qualified for tPA because he is outside of the window. UDS is negative for cocaine.  He received ASA 300mg  in ED.   -Admit to telemetry/neuro floor  -Neuro check q4hrs  -Speech therapy, PT/OT  -Start ASA 81mg  qd  -Risk stratification with FLP, HbA1c   -Carotid Dopplers  -Cardiac enzyme x1 neg  -Given ischemic stroke, permissive HTN and not to lower BP at this time, goal is <220/120.  -Will get 2D echocardiogram -Speech eval and swallow eval -Start statin: Zocor 20mg  qhs bc his LDL is 116, goal is <100.  2. HTN: BP 156/113.  Will allow permissive HTN at this time and perhaps start back his HCTZ at time of discharge.  DVT ppx: heparin SQ  Dispo: Disposition is deferred at this time, awaiting improvement of current medical problems. Anticipated discharge in approximately 1-2  day(s).   The patient does not have a current PCP (Default, Provider, MD), therefore will require OPC follow-up after discharge.   The patient does not have transportation limitations that hinder transportation to clinic appointments.  Signed: Jamelyn Bovard 08/01/2012, 12:24 PM

## 2012-08-01 NOTE — Evaluation (Signed)
Speech Language Pathology Evaluation Patient Details Name: [name] MRN: [id number] DOB: [birth year] Today's Date: 08/01/2012 Time: 1235-1300 SLP Time Calculation (min): 25 min  Problem List:  Patient Active Problem List  Diagnosis  . GONORRHEA  . ALCOHOL ABUSE  . COCAINE ABUSE  . HYPERTENSION  . PROSTATITIS, ACUTE  . CRUSHING INJURY, BACK   Past Medical History:  Past Medical History  Diagnosis Date  . ETOH abuse     quit  . Hypertension   . Cocaine abuse    Past Surgical History:  Past Surgical History  Procedure Date  . Hand surgery    HPI:  Pt. is a 57 y.o. male with h/o cocaine abuse and HTN (not compliant with medications) presented to the ED for left sided weakness when he woke up at [facility] on day of admission (1/15). Per MD progress notes, patient's symptoms have been resolving quickly.    Assessment / Plan / Recommendation Clinical Impression  Per this BSE, patient's swallow function appears to be WFL-WNL, with no overt s/s aspiration observed with solid, thin liquid, or puree consistencies. Patient did not exhibit any s/s of difficulty or pain during mastication, oral prep, or swallow, and did not c/o of any difficulty.     SLP Assessment       Follow Up Recommendations  None    Frequency and Duration   N/A     Pertinent Vitals/Pain No patient c/o pain, no observed changes in vital signs   SLP Goals     SLP Evaluation Prior Functioning      Cognition  Orientation Level: Oriented X4    Comprehension       Expression     Oral / Motor Oral Motor/Sensory Function Overall Oral Motor/Sensory Function: Appears within functional limits for tasks assessed     Pablo Lawrence 08/01/2012, 4:40 PM  Angela Nevin, MA, CCC-SLP [facility] Speech-Language Pathologist

## 2012-08-01 NOTE — Consult Note (Addendum)
Referring Physician:    Chief Complaint: Left sided weakness and slurred speech.  HPI:  Mr. Batzel is a pleasant 57 years old male with a past medical history significant for hypertension (poorly adherent to treatment, stopped taking his antihypertensive medication more than one year ago), cocaine abuse, who went to bed at 10 pm and woke up about 5 am today and noticed that he couldn't use his left arm and leg. He said that he tried to get up and go the bathroom by 5:30 but he was unable to walk. Then, by 6 am he was back to normal and decided to go to work. He reported that approximately at he was on the bus and suddenly the left side was weak again and also his speech was slurred. Never had similar symptoms before. Upon arrival to the ER he was still having slurred speech but the left sided weakness resolved. CT brain showed no acute intracranial abnormalities.      Past Medical History  Diagnosis Date  . ETOH abuse     quit  . Hypertension   . Cocaine abuse     Past Surgical History  Procedure Date  . Hand surgery     No family history on file. Social History:  reports that he has been smoking.  He does not have any smokeless tobacco history on file. He reports that he does not drink alcohol or use illicit drugs.  Allergies:  Allergies  Allergen Reactions  . Penicillins Other (See Comments)    unknown    Medications:  Current Facility-Administered Medications  Medication Dose Route Frequency Provider Last Rate Last Dose  . aspirin suppository 300 mg  300 mg Rectal Once [name], MD      . gadobenate dimeglumine (MULTIHANCE) injection 15 mL  15 mL Intravenous Once Medication Radiologist, MD       No current outpatient prescriptions on file.     ROS: Denies headache, vertigo, double vision, difficulty swallowing. No chest pain, shortness of breath, or palpitations. No abdominal pain, constipation, or diarrhea. No hematuria or dysuria. No fever or  chills.  Physical Examination: Blood pressure 173/106, pulse 63, temperature 98.3 F (36.8 C), temperature source Oral, resp. rate 17, SpO2 100.00%.  Neurologic Examination: Mental status: Alert, awake, oriented x 4. Comprehension, naming, and repetition normal. No right to left confusion. No dysphasia but mild dysarthria. CN 2-12: significant for left facial weakness central type. Otherwise, remaining cranial reves are intact. Motor: 5/5 all over. Sensory: intact in all modalities. DTR's: 2 + all over. Plantar responses: downgoing bilaterally. Coordination: finger to nose and heel to shin normal. Gait: no tested.  Laboratory Studies:  Basic Metabolic Panel:  Lab 08/01/12 1191 08/01/12 0709  NA 141 139  K 3.9 3.9  CL 102 103  CO2 -- 28  GLUCOSE 100* 103*  BUN 17 18  CREATININE 0.90 0.67  CALCIUM -- 8.8  MG -- --  PHOS -- --    Liver Function Tests:  Lab 08/01/12 0709  AST 22  ALT 16  ALKPHOS 69  BILITOT 0.3  PROT 6.6  ALBUMIN 3.6   No results found for this basename: LIPASE:5,AMYLASE:5 in the last 168 hours No results found for this basename: AMMONIA:3 in the last 168 hours  CBC:  Lab 08/01/12 0727 08/01/12 0709  WBC -- 3.5*  NEUTROABS -- 1.6*  HGB 16.0 15.2  HCT 47.0 44.6  MCV -- 88.7  PLT -- 220    Cardiac Enzymes:  Lab 08/01/12 0710  CKTOTAL --  CKMB --  CKMBINDEX --  TROPONINI <0.30    BNP: No components found with this basename: POCBNP:5  CBG:  Lab 08/01/12 0718  GLUCAP 91    Microbiology: Results for orders placed during the hospital encounter of 05/29/10  URINE CULTURE     Status: Normal   Collection Time   05/29/10  5:39 AM      Component Value Range Status Comment   Specimen Description URINE, CLEAN CATCH   Final    Special Requests ADDED ON 05/29/10 AT 0759   Final    Culture  Setup Time [id number]   Final    Colony Count NO GROWTH   Final    Culture NO GROWTH   Final    Report Status 05/30/2010 FINAL   Final      Coagulation Studies:  Basename 08/01/12 0709  LABPROT 13.5  INR 1.04    Urinalysis: No results found for this basename: COLORURINE:2,APPERANCEUR:2,LABSPEC:2,PHURINE:2,GLUCOSEU:2,HGBUR:2,BILIRUBINUR:2,KETONESUR:2,PROTEINUR:2,UROBILINOGEN:2,NITRITE:2,LEUKOCYTESUR:2 in the last 168 hours  Lipid Panel: No results found for this basename: chol, trig, hdl, cholhdl, vldl, ldlcalc    HgbA1C:  No results found for this basename: HGBA1C    Urine Drug Screen:     Component Value Date/Time   LABOPIA NONE DETECTED 09/16/2010 0239   LABOPIA NEG 08/26/2008 0238   COCAINSCRNUR NONE DETECTED 09/16/2010 0239   COCAINSCRNUR NEG 08/26/2008 0238   LABBENZ NONE DETECTED 09/16/2010 0239   LABBENZ NEG 08/26/2008 0238   AMPHETMU NONE DETECTED 09/16/2010 0239   AMPHETMU NEG 08/26/2008 0238   THCU NONE DETECTED 09/16/2010 0239   LABBARB  Value: NONE DETECTED        DRUG SCREEN FOR MEDICAL PURPOSES ONLY.  IF CONFIRMATION IS NEEDED FOR ANY PURPOSE, NOTIFY LAB WITHIN 5 DAYS.        LOWEST DETECTABLE LIMITS FOR URINE DRUG SCREEN Drug Class       Cutoff (ng/mL) Amphetamine      1000 Barbiturate      200 Benzodiazepine   200 Tricyclics       300 Opiates          300 Cocaine          300 THC              50 09/16/2010 0239    Alcohol Level:  Lab 08/01/12 0710  ETH <11    Other results:  Imaging: Ct Head Wo Contrast  08/01/2012  *RADIOLOGY REPORT*  Clinical Data: Progressive left-sided weakness.  Unable to walk. Stroke symptoms.  CT HEAD WITHOUT CONTRAST  Technique:  Contiguous axial images were obtained from the base of the skull through the vertex without contrast.  Comparison: 07/09/2005  Findings: The ventricles and sulci are symmetrical without significant effacement, displacement, or dilatation. No mass effect or midline shift. No abnormal extra-axial fluid collections.  There are patchy low attenuation changes in the deep white matter most consistent with small vessel ischemic change.  Old lacunar infarct in the  anterior left centrum [place] valley.  No definitive changes of acute infarct.  The grey-white matter junction is distinct. Basal cisterns are not effaced. No acute intracranial hemorrhage. No depressed skull fractures. Visualized portions of the paranasal sinuses demonstrate mucosal membrane thickening in the ethmoid sinuses.  No acute air-fluid levels.  Mastoid air cells are not opacified.  Vascular calcifications.  IMPRESSION: No acute intracranial abnormalities demonstrated.  Results telephoned to Dr. Fonnie Jarvis at [phone] hours on 08/01/2012.   Original Report Authenticated By: Burman Nieves, M.D.  Dg Chest Port 1 View  08/01/2012  *RADIOLOGY REPORT*  Clinical Data: Left-sided weakness  PORTABLE CHEST - 1 VIEW  Comparison: 10/31/2011; 08/22/2009; 09/18/2008  Findings: Grossly unchanged cardiac silhouette and mediastinal contours with mild tortuosity of the thoracic aorta.  No focal airspace opacities.  No definite pleural effusion, though note, the bilateral costophrenic angles are excluded from view.  No pneumothorax.  Unchanged bones including old/healed left-sided eighth rib fracture.  IMPRESSION: No acute cardiopulmonary disease.   Original Report Authenticated By: Tacey Ruiz, MD     Assessment: 57 y.o. male with risk factors for stroke that include untreated hypertension and cocaine abuse, now with new onset intermittent left sided weakness (resolved at the time of my examination) and dysarthria, most likely due to a right subcortical infarct. Patient to be admitted to complete stroke work up. Follow stroke non tpa protocol. Will follow up.  Plan: 1. HgbA1c, fasting lipid panel 2. MRI, MRA  of the brain without contrast 3. PT consult, OT consult, Speech consult 4. Echocardiogram 5. Carotid dopplers 6. Prophylactic therapy: aspirin 81 mh daily. Start statins. 7. Risk factor modification 8. Telemetry monitoring 9. Frequent neuro checks    Wyatt Portela, MD [facility] [phone]  If 7pm- 7am, please page neurology on call at [phone].  08/01/2012, 8:28 AM

## 2012-08-01 NOTE — ED Notes (Signed)
Admitting MD at bedside.

## 2012-08-02 DIAGNOSIS — Z8673 Personal history of transient ischemic attack (TIA), and cerebral infarction without residual deficits: Secondary | ICD-10-CM | POA: Diagnosis present

## 2012-08-02 DIAGNOSIS — I6350 Cerebral infarction due to unspecified occlusion or stenosis of unspecified cerebral artery: Principal | ICD-10-CM

## 2012-08-02 MED ORDER — ENOXAPARIN SODIUM 40 MG/0.4ML ~~LOC~~ SOLN
40.0000 mg | SUBCUTANEOUS | Status: DC
  Filled 2012-08-02 (×2): qty 0.4

## 2012-08-02 MED ORDER — METHOCARBAMOL 500 MG PO TABS: 500.0000 mg | ORAL_TABLET | Freq: Three times a day (TID) | ORAL | Status: DC | PRN

## 2012-08-02 MED ORDER — INFLUENZA VIRUS VACC SPLIT PF IM SUSP
0.5000 mL | Freq: Once | INTRAMUSCULAR | Status: DC
  Filled 2012-08-02: qty 0.5

## 2012-08-02 MED ORDER — LISINOPRIL 10 MG PO TABS
10.0000 mg | ORAL_TABLET | Freq: Every day | ORAL | Status: DC
  Filled 2012-08-02: qty 1

## 2012-08-02 MED ORDER — ASPIRIN 81 MG PO TBEC: 81.0000 mg | DELAYED_RELEASE_TABLET | Freq: Every day | ORAL | Status: DC

## 2012-08-02 MED ORDER — INFLUENZA VAC TYPES A & B PF IM SUSP: 0.5000 mL | Freq: Once | INTRAMUSCULAR | Status: DC

## 2012-08-02 MED ORDER — PRAVASTATIN SODIUM 20 MG PO TABS: 20.0000 mg | ORAL_TABLET | Freq: Every day | ORAL | Status: DC

## 2012-08-02 NOTE — Evaluation (Signed)
Occupational Therapy Evaluation Patient Details Name: [name] MRN: [id number] DOB: 07-01-1956 Today's Date: 08/02/2012 Time: 9528-4132 OT Time Calculation (min): 13 min  OT Assessment / Plan / Recommendation Clinical Impression  Pt admitted with L side weakness and dysarthria due to acute lacunar infarct posterior limb R internal capsule.  All symptoms have resolved.  Pt is independent in mobility and ADL.  No further OT needs.    OT Assessment  Patient does not need any further OT services    Follow Up Recommendations  No OT follow up    Barriers to Discharge      Equipment Recommendations  None recommended by OT    Recommendations for Other Services    Frequency       Precautions / Restrictions     Pertinent Vitals/Pain No pain    ADL  Eating/Feeding: Independent Where Assessed - Eating/Feeding: Bed level Toilet Transfer: Independent Toilet Transfer Method: Sit to stand Toilet Transfer Equipment: Comfort height toilet Transfers/Ambulation Related to ADLs: Independent ADL Comments: Pt is independent in self care.    OT Diagnosis:    OT Problem List:   OT Treatment Interventions:     OT Goals    Visit Information  Last OT Received On: 08/02/12 PT/OT Co-Evaluation/Treatment: Yes    Subjective Data  Subjective: "I am blessed." Patient Stated Goal: Return home at [facility].   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: No difficulties Dominant Hand: Left         Vision/Perception     Cognition  Overall Cognitive Status:  Appears within functional limits for tasks assessed/performed Arousal/Alertness: Awake/alert Orientation Level: Appears intact for tasks assessed Behavior During Session: [facility] for tasks performed    Extremity/Trunk Assessment Right Upper Extremity Assessment RUE ROM/Strength/Tone: Within functional levels RUE Sensation: WFL - Light Touch;WFL - Proprioception RUE Coordination: WFL - gross/fine motor Left Upper Extremity Assessment LUE ROM/Strength/Tone: Within functional levels LUE Sensation: WFL - Light Touch;WFL - Proprioception LUE Coordination: WFL - gross/fine motor Trunk Assessment Trunk Assessment: Normal     Mobility Bed Mobility Bed Mobility: Supine to Sit;Sit to Supine;Sitting - Scoot to Edge of Bed Supine to Sit: 7: Independent Sitting - Scoot to Edge of Bed: 7: Independent Sit to Supine: 7: Independent Transfers Transfers: Sit to Stand;Stand to Sit Sit to Stand: 7: Independent Stand to Sit: 7: Independent     Shoulder Instructions     Exercise     Balance     End of Session OT - End of Session Activity Tolerance: Patient tolerated treatment well Patient left: in bed;with call bell/phone within reach;with family/visitor present  GO     Evern Bio 08/02/2012, 9:47 AM [phone]

## 2012-08-02 NOTE — Progress Notes (Signed)
Subjective: Pt lying in bed, no complaints. Reports full return of strength and no slurring. Got out of bed w PT this morning and felt great. Denies dizziness, numbness, weakness, confusion, problems with speech. Echo yesterday negative for embolic source. Waiting on carotid dopplers. Denies CP, SOB, N/V, dizziness, abdominal pain, diarrhea.  Objective: Vital signs in last 24 hours: Filed Vitals:   08/01/12 2218 08/02/12 0224 08/02/12 0500 08/02/12 0516  BP: 132/89 106/82  134/71  Pulse: 74 62  55  Temp: 98.3 F (36.8 C) 97.8 F (36.6 C)  98 F (36.7 C)  TempSrc: Oral Oral  Oral  Resp: 16 16  16   Height:      Weight:   139 lb 5.3 oz (63.2 kg)   SpO2: 99% 98%  99%   Weight change:   Intake/Output Summary (Last 24 hours) at 08/02/12 0935 Last data filed at 08/02/12 0907  Gross per 24 hour  Intake    240 ml  Output      0 ml  Net    240 ml   Vitals reviewed. General: sitting up in bed, smiling, NAD HEENT: pupils 3mm, equal round and reactive, , EOMI, no scleral icterus Cardiac: RRR, no rubs, murmurs or gallops Pulm: clear to auscultation bilaterally, no wheezes, rales, or rhonchi Abd: soft, nontender, nondistended, BS present Ext: warm and well perfused, no pedal edema Neuro:  Mental status: Alert and oriented Reflexes: 2+ biceps and patellar bilaterally Motor: 5/5 bilateral UE and LE and face Sensory: intact over face, bilateral UE and LE Coordination: normal  Lab Results: Basic Metabolic Panel:  Lab 08/01/12 4098 08/01/12 0709  NA 141 139  K 3.9 3.9  CL 102 103  CO2 -- 28  GLUCOSE 100* 103*  BUN 17 18  CREATININE 0.90 0.67  CALCIUM -- 8.8  MG -- --  PHOS -- --   Liver Function Tests:  Lab 08/01/12 0709  AST 22  ALT 16  ALKPHOS 69  BILITOT 0.3  PROT 6.6  ALBUMIN 3.6   CBC:  Lab 08/01/12 0727 08/01/12 0709  WBC -- 3.5*  NEUTROABS -- 1.6*  HGB 16.0 15.2  HCT 47.0 44.6  MCV -- 88.7  PLT -- 220   Cardiac Enzymes:  Lab 08/01/12 0710  CKTOTAL  --  CKMB --  CKMBINDEX --  TROPONINI <0.30   CBG:  Lab 08/01/12 0718  GLUCAP 91   Hemoglobin A1C:  Lab 08/01/12 0709  HGBA1C 6.2*   Fasting Lipid Panel:  Lab 08/01/12 0710  CHOL 182  HDL 56  LDLCALC 116*  TRIG 50  CHOLHDL 3.3  LDLDIRECT --   Coagulation:  Lab 08/01/12 0709  LABPROT 13.5  INR 1.04   Urine Drug Screen: Drugs of Abuse     Component Value Date/Time   LABOPIA NONE DETECTED 08/01/2012 0941   LABOPIA NEG 08/26/2008 0238   COCAINSCRNUR NONE DETECTED 08/01/2012 0941   COCAINSCRNUR NEG 08/26/2008 0238   LABBENZ NONE DETECTED 08/01/2012 0941   LABBENZ NEG 08/26/2008 0238   AMPHETMU NONE DETECTED 08/01/2012 0941   AMPHETMU NEG 08/26/2008 0238   THCU NONE DETECTED 08/01/2012 0941   LABBARB NONE DETECTED 08/01/2012 0941    Alcohol Level:  Lab 08/01/12 0710  ETH <11   Urinalysis:  Lab 08/01/12 0941  COLORURINE YELLOW  LABSPEC 1.022  PHURINE 7.0  GLUCOSEU NEGATIVE  HGBUR NEGATIVE  BILIRUBINUR NEGATIVE  KETONESUR NEGATIVE  PROTEINUR NEGATIVE  UROBILINOGEN 1.0  NITRITE NEGATIVE  LEUKOCYTESUR NEGATIVE    Studies/Results: Ct Head  Wo Contrast  08/01/2012  *RADIOLOGY REPORT*  Clinical Data: Progressive left-sided weakness.  Unable to walk. Stroke symptoms.  CT HEAD WITHOUT CONTRAST  Technique:  Contiguous axial images were obtained from the base of the skull through the vertex without contrast.  Comparison: 07/09/2005  Findings: The ventricles and sulci are symmetrical without significant effacement, displacement, or dilatation. No mass effect or midline shift. No abnormal extra-axial fluid collections.  There are patchy low attenuation changes in the deep white matter most consistent with small vessel ischemic change.  Old lacunar infarct in the anterior left centrum semiovale valley.  No definitive changes of acute infarct.  The grey-white matter junction is distinct. Basal cisterns are not effaced. No acute intracranial hemorrhage. No depressed skull fractures.  Visualized portions of the paranasal sinuses demonstrate mucosal membrane thickening in the ethmoid sinuses.  No acute air-fluid levels.  Mastoid air cells are not opacified.  Vascular calcifications.  IMPRESSION: No acute intracranial abnormalities demonstrated.  Results telephoned to Dr. Fonnie Jarvis at [phone] hours on 08/01/2012.   Original Report Authenticated By: Burman Nieves, M.D.    Mr [facility] Wo Contrast  08/01/2012  *RADIOLOGY REPORT*  Clinical Data:  57 year old male with acute onset left extremity weakness with stuttering course, now improved.  Contrast: 14mL MULTIHANCE GADOBENATE DIMEGLUMINE 529 MG/ML IV SOLN  Comparison: Head CTs 07/09/2005.  MRI HEAD WITHOUT AND WITH CONTRAST  Technique: Multiplanar, multiecho pulse sequences of the brain and surrounding structures were obtained according to standard protocol without and with intravenous contrast.  Findings:  Linear area of restricted diffusion posterior limb right internal capsule (series 4 image 15, series 400 image 15).  Faint associated T2 hyperintensity.  No mass effect or associated hemorrhage.  No other restricted diffusion identified. Major intracranial vascular flow voids are preserved, MRA findings are below.  Scattered dilated perivascular spaces.  Scattered T2 and FLAIR hyperintensity in the cerebral white matter, patchy and confluent periatrial involvement.  Evidence of chronic left dorsal thalamus lacunar infarct.  Brainstem, cerebellum, and visualized cervical spine within normal limits.  Negative pituitary.  No cortical encephalomalacia. No abnormal enhancement identified.  Visualized orbit soft tissues are within normal limits.  Minor paranasal sinus mucosal thickening.  Mastoids are clear.  Normal bone marrow signal.  Negative scalp soft tissues.  IMPRESSION: 1.  Acute lacunar type infarct posterior limb right internal capsule.  No mass effect or hemorrhage. 2.  Underlying signal changes suggestive of chronic small vessel disease. 3.   MRA findings below.  MRA NECK WITHOUT AND WITH CONTRAST  Technique:  Angiographic images of the neck were obtained using MRA technique without and with intravenous contrast.  Carotid stenosis measurements (when applicable) are obtained utilizing NASCET criteria, using the distal internal carotid diameter as the denominator.  Findings:  Precontrast time-of-flight images demonstrate antegrade flow in both carotid and vertebral arteries.  Tortuous vertebral arteries.  Partially retropharyngeal course of the left carotid artery.  Continued antegrade flow to the skull base.  Postcontrast MRA images demonstrate a three-vessel arch configuration.  Great vessel origins appear within normal limits.  Normal right common carotid artery.  At the right carotid bifurcation there is irregularity at the right ICA origin and posterior bulb.  No significant stenosis occurs.  Cervical right ICA normal aside from mild tortuosity.  Left common carotid artery within normal limits.  Irregularity at the left carotid bifurcation involving the medial left ICA origin and bulb.  Still, no significant stenosis occurs.  Cervical left ICA normal elsewhere except for tortuosity.  Codominant vertebral arteries with intermittent tortuosity in the neck.  Both vertebral artery origins are within normal limits.  No cervical vertebral artery stenosis.  IMPRESSION: 1.  Bilateral carotid bifurcation atherosclerosis but no significant stenosis results. 2.  Tortuosity. 3.  Otherwise negative neck MRA.  See intracranial findings below.  MRA HEAD WITHOUT CONTRAST  Technique: Angiographic images of the Circle of Willis were obtained using MRA technique without  intravenous contrast.  Findings:  Study is intermittently degraded by motion artifact despite repeated imaging attempts.  Antegrade flow in the posterior circulation.  Codominant distal vertebral arteries.  Normal right PICA.  Dominant left AICA. Normal vertebrobasilar junction.  No basilar stenosis.   SCA and left PCA origins are within normal limits.  Fetal right PCA origin. Small left posterior communicating artery also present.  Bilateral PCA branches are within normal limits.  Antegrade flow in both ICA siphons. Mild ICA irregularity.  No ICA stenosis.  Posterior communicating artery origins are within normal limits.  Carotid termini are patent.  MCA and ACA origins are within normal limits.  Anterior communicating artery within normal limits.  Visualized ACA branches within normal limits.  Visualized left MCA branches within normal limits.  Mild irregularity of the right MCA M1 segment.  Both MCA demonstrate fairly early branching.  Visualized right MCA branches are within normal limits.  IMPRESSION: 1.  Mild irregularity at the right MCA M1 segment.  Otherwise negative anterior circulation allowing for a degree of motion artifact. 2.  Negative posterior circulation.   Original Report Authenticated By: Erskine Speed, M.D.    Mr Angiogram Neck W Wo Contrast  08/01/2012  *RADIOLOGY REPORT*  Clinical Data:  57 year old male with acute onset left extremity weakness with stuttering course, now improved.  Contrast: 14mL MULTIHANCE GADOBENATE DIMEGLUMINE 529 MG/ML IV SOLN  Comparison: Head CTs 07/09/2005.  MRI HEAD WITHOUT AND WITH CONTRAST  Technique: Multiplanar, multiecho pulse sequences of the brain and surrounding structures were obtained according to standard protocol without and with intravenous contrast.  Findings:  Linear area of restricted diffusion posterior limb right internal capsule (series 4 image 15, series 400 image 15).  Faint associated T2 hyperintensity.  No mass effect or associated hemorrhage.  No other restricted diffusion identified. Major intracranial vascular flow voids are preserved, MRA findings are below.  Scattered dilated perivascular spaces.  Scattered T2 and FLAIR hyperintensity in the cerebral white matter, patchy and confluent periatrial involvement.  Evidence of chronic left  dorsal thalamus lacunar infarct.  Brainstem, cerebellum, and visualized cervical spine within normal limits.  Negative pituitary.  No cortical encephalomalacia. No abnormal enhancement identified.  Visualized orbit soft tissues are within normal limits.  Minor paranasal sinus mucosal thickening.  Mastoids are clear.  Normal bone marrow signal.  Negative scalp soft tissues.  IMPRESSION: 1.  Acute lacunar type infarct posterior limb right internal capsule.  No mass effect or hemorrhage. 2.  Underlying signal changes suggestive of chronic small vessel disease. 3.  MRA findings below.  MRA NECK WITHOUT AND WITH CONTRAST  Technique:  Angiographic images of the neck were obtained using MRA technique without and with intravenous contrast.  Carotid stenosis measurements (when applicable) are obtained utilizing NASCET criteria, using the distal internal carotid diameter as the denominator.  Findings:  Precontrast time-of-flight images demonstrate antegrade flow in both carotid and vertebral arteries.  Tortuous vertebral arteries.  Partially retropharyngeal course of the left carotid artery.  Continued antegrade flow to the skull base.  Postcontrast MRA images demonstrate a three-vessel arch  configuration.  Great vessel origins appear within normal limits.  Normal right common carotid artery.  At the right carotid bifurcation there is irregularity at the right ICA origin and posterior bulb.  No significant stenosis occurs.  Cervical right ICA normal aside from mild tortuosity.  Left common carotid artery within normal limits.  Irregularity at the left carotid bifurcation involving the medial left ICA origin and bulb.  Still, no significant stenosis occurs.  Cervical left ICA normal elsewhere except for tortuosity.  Codominant vertebral arteries with intermittent tortuosity in the neck.  Both vertebral artery origins are within normal limits.  No cervical vertebral artery stenosis.  IMPRESSION: 1.  Bilateral carotid  bifurcation atherosclerosis but no significant stenosis results. 2.  Tortuosity. 3.  Otherwise negative neck MRA.  See intracranial findings below.  MRA HEAD WITHOUT CONTRAST  Technique: Angiographic images of the Circle of Willis were obtained using MRA technique without  intravenous contrast.  Findings:  Study is intermittently degraded by motion artifact despite repeated imaging attempts.  Antegrade flow in the posterior circulation.  Codominant distal vertebral arteries.  Normal right PICA.  Dominant left AICA. Normal vertebrobasilar junction.  No basilar stenosis.  SCA and left PCA origins are within normal limits.  Fetal right PCA origin. Small left posterior communicating artery also present.  Bilateral PCA branches are within normal limits.  Antegrade flow in both ICA siphons. Mild ICA irregularity.  No ICA stenosis.  Posterior communicating artery origins are within normal limits.  Carotid termini are patent.  MCA and ACA origins are within normal limits.  Anterior communicating artery within normal limits.  Visualized ACA branches within normal limits.  Visualized left MCA branches within normal limits.  Mild irregularity of the right MCA M1 segment.  Both MCA demonstrate fairly early branching.  Visualized right MCA branches are within normal limits.  IMPRESSION: 1.  Mild irregularity at the right MCA M1 segment.  Otherwise negative anterior circulation allowing for a degree of motion artifact. 2.  Negative posterior circulation.   Original Report Authenticated By: Erskine Speed, M.D.    Mr [facility] Wo Contrast  08/01/2012  *RADIOLOGY REPORT*  Clinical Data:  57 year old male with acute onset left extremity weakness with stuttering course, now improved.  Contrast: 14mL MULTIHANCE GADOBENATE DIMEGLUMINE 529 MG/ML IV SOLN  Comparison: Head CTs 07/09/2005.  MRI HEAD WITHOUT AND WITH CONTRAST  Technique: Multiplanar, multiecho pulse sequences of the brain and surrounding structures were obtained according to  standard protocol without and with intravenous contrast.  Findings:  Linear area of restricted diffusion posterior limb right internal capsule (series 4 image 15, series 400 image 15).  Faint associated T2 hyperintensity.  No mass effect or associated hemorrhage.  No other restricted diffusion identified. Major intracranial vascular flow voids are preserved, MRA findings are below.  Scattered dilated perivascular spaces.  Scattered T2 and FLAIR hyperintensity in the cerebral white matter, patchy and confluent periatrial involvement.  Evidence of chronic left dorsal thalamus lacunar infarct.  Brainstem, cerebellum, and visualized cervical spine within normal limits.  Negative pituitary.  No cortical encephalomalacia. No abnormal enhancement identified.  Visualized orbit soft tissues are within normal limits.  Minor paranasal sinus mucosal thickening.  Mastoids are clear.  Normal bone marrow signal.  Negative scalp soft tissues.  IMPRESSION: 1.  Acute lacunar type infarct posterior limb right internal capsule.  No mass effect or hemorrhage. 2.  Underlying signal changes suggestive of chronic small vessel disease. 3.  MRA findings below.  MRA NECK WITHOUT AND  WITH CONTRAST  Technique:  Angiographic images of the neck were obtained using MRA technique without and with intravenous contrast.  Carotid stenosis measurements (when applicable) are obtained utilizing NASCET criteria, using the distal internal carotid diameter as the denominator.  Findings:  Precontrast time-of-flight images demonstrate antegrade flow in both carotid and vertebral arteries.  Tortuous vertebral arteries.  Partially retropharyngeal course of the left carotid artery.  Continued antegrade flow to the skull base.  Postcontrast MRA images demonstrate a three-vessel arch configuration.  Great vessel origins appear within normal limits.  Normal right common carotid artery.  At the right carotid bifurcation there is irregularity at the right ICA origin  and posterior bulb.  No significant stenosis occurs.  Cervical right ICA normal aside from mild tortuosity.  Left common carotid artery within normal limits.  Irregularity at the left carotid bifurcation involving the medial left ICA origin and bulb.  Still, no significant stenosis occurs.  Cervical left ICA normal elsewhere except for tortuosity.  Codominant vertebral arteries with intermittent tortuosity in the neck.  Both vertebral artery origins are within normal limits.  No cervical vertebral artery stenosis.  IMPRESSION: 1.  Bilateral carotid bifurcation atherosclerosis but no significant stenosis results. 2.  Tortuosity. 3.  Otherwise negative neck MRA.  See intracranial findings below.  MRA HEAD WITHOUT CONTRAST  Technique: Angiographic images of the Circle of Willis were obtained using MRA technique without  intravenous contrast.  Findings:  Study is intermittently degraded by motion artifact despite repeated imaging attempts.  Antegrade flow in the posterior circulation.  Codominant distal vertebral arteries.  Normal right PICA.  Dominant left AICA. Normal vertebrobasilar junction.  No basilar stenosis.  SCA and left PCA origins are within normal limits.  Fetal right PCA origin. Small left posterior communicating artery also present.  Bilateral PCA branches are within normal limits.  Antegrade flow in both ICA siphons. Mild ICA irregularity.  No ICA stenosis.  Posterior communicating artery origins are within normal limits.  Carotid termini are patent.  MCA and ACA origins are within normal limits.  Anterior communicating artery within normal limits.  Visualized ACA branches within normal limits.  Visualized left MCA branches within normal limits.  Mild irregularity of the right MCA M1 segment.  Both MCA demonstrate fairly early branching.  Visualized right MCA branches are within normal limits.  IMPRESSION: 1.  Mild irregularity at the right MCA M1 segment.  Otherwise negative anterior circulation  allowing for a degree of motion artifact. 2.  Negative posterior circulation.   Original Report Authenticated By: Erskine Speed, M.D.    Dg Chest Port 1 View  08/01/2012  *RADIOLOGY REPORT*  Clinical Data: Left-sided weakness  PORTABLE CHEST - 1 VIEW  Comparison: 10/31/2011; 08/22/2009; 09/18/2008  Findings: Grossly unchanged cardiac silhouette and mediastinal contours with mild tortuosity of the thoracic aorta.  No focal airspace opacities.  No definite pleural effusion, though note, the bilateral costophrenic angles are excluded from view.  No pneumothorax.  Unchanged bones including old/healed left-sided eighth rib fracture.  IMPRESSION: No acute cardiopulmonary disease.   Original Report Authenticated By: Tacey Ruiz, MD    Medications: I have reviewed the patient's current medications. Scheduled Meds:   . aspirin EC  81 mg Oral Daily  . enoxaparin (LOVENOX) injection  40 mg Subcutaneous Q24H  . simvastatin  20 mg Oral q1800   Continuous Infusions:  PRN Meds:.acetaminophen, acetaminophen, methocarbamol, ondansetron (ZOFRAN) IV, ondansetron  Assessment/Plan: 1. Acute stroke of R internal capsule, posterior limb Presented yesterday with  L UE and LE weakness as well as L facial weakness and slurred speech. MRI demonstrated acute infarct in posterior limb of R internal capsule. Patient has near full symptom resolution since presentation. Received full dose ASA yesterday and now on 81mg  daily. No significant intracranial stenoses on MRA. Echo wnl. Carotid dopplers pending. HbA1c borderline elevated at 6.2. Cholesterol notable for elevated LDL (116). - Continue 81mg  ASA daily for secondary stroke prophylaxis  - Zocor for hyperlipidemia (goal LDL <100) - Smoking cessation counseling. - Permissive HTN for first 48 hrs for BP <220/120.  (start ACE-i tomorrow am) - Carotid dopplers pending.  - PT/OT today  2. HTN Patient has history of HTN, has not been taking any antiHTN at home, no PCP. BP max  here 186/115 here, currently 134/71. - Permissive HTN in setting of acute stroke as per #1.  - Will start lisinopril tomorrow and see how he tolerates. Outpatient follow up with [facility].   Dispo: Anticipate home tomorrow  The patient does not have a current PCP (Default, Provider, MD), therefore will be requiring [facility] follow-up after discharge.   The patient does not have transportation limitations that hinder transportation to clinic appointments.  .Services Needed at time of discharge: Y = Yes, Blank = No PT:   OT:   RN:   Equipment:   Other:     LOS: 1 day   Bronson Curb 08/02/2012, 9:35 AM

## 2012-08-02 NOTE — Progress Notes (Signed)
Stroke Team Progress Note  HISTORY [name] is a pleasant 57 years old male with a past medical history significant for hypertension (poorly adherent to treatment, stopped taking his antihypertensive medication more than one year ago), cocaine abuse, who went to bed at 10 pm 07/31/2012 and woke up about 5 am 08/01/2012  and noticed that he couldn't use his left arm and leg. He said that he tried to get up and go the bathroom by 5:30 but he was unable to walk. Then, by 6 am he was back to normal and decided to go to work. He reported that approximately at he was on the bus and suddenly the left side was weak again and also his speech was slurred. Never had similar symptoms before. Upon arrival to the ER he was still having slurred speech but the left sided weakness resolved.  CT brain showed no acute intracranial abnormalities.  Patient was not a TPA candidate secondary to resolution of symptoms. He was admitted for further evaluation and treatment.  SUBJECTIVE No family is at the bedside.  Overall he feels his condition is completely resolved.   OBJECTIVE Most recent Vital Signs: Filed Vitals:   08/01/12 2218 08/02/12 0224 08/02/12 0500 08/02/12 0516  BP: 132/89 106/82  134/71  Pulse: 74 62  55  Temp: 98.3 F (36.8 C) 97.8 F (36.6 C)  98 F (36.7 C)  TempSrc: Oral Oral  Oral  Resp: 16 16  16   Height:      Weight:   63.2 kg (139 lb 5.3 oz)   SpO2: 99% 98%  99%   CBG (last 3)   Basename 08/01/12 0718  GLUCAP 91    IV Fluid Intake:     MEDICATIONS    . aspirin EC  81 mg Oral Daily  . enoxaparin (LOVENOX) injection  40 mg Subcutaneous Q24H  . simvastatin  20 mg Oral q1800   PRN:  acetaminophen, acetaminophen, ondansetron (ZOFRAN) IV, ondansetron  Diet:  General thin liquids Activity:  Bedrest with Bathroom privileges DVT Prophylaxis:  Lovenox 40 mg sq daily   CLINICALLY SIGNIFICANT STUDIES Basic Metabolic Panel:  Lab 08/01/12 4098 08/01/12 0709  NA 141 139  K 3.9 3.9  CL  102 103  CO2 -- 28  GLUCOSE 100* 103*  BUN 17 18  CREATININE 0.90 0.67  CALCIUM -- 8.8  MG -- --  PHOS -- --   Liver Function Tests:  Lab 08/01/12 0709  AST 22  ALT 16  ALKPHOS 69  BILITOT 0.3  PROT 6.6  ALBUMIN 3.6   CBC:  Lab 08/01/12 0727 08/01/12 0709  WBC -- 3.5*  NEUTROABS -- 1.6*  HGB 16.0 15.2  HCT 47.0 44.6  MCV -- 88.7  PLT -- 220   Coagulation:  Lab 08/01/12 0709  LABPROT 13.5  INR 1.04   Cardiac Enzymes:  Lab 08/01/12 0710  CKTOTAL --  CKMB --  CKMBINDEX --  TROPONINI <0.30   Urinalysis:  Lab 08/01/12 0941  COLORURINE YELLOW  LABSPEC 1.022  PHURINE 7.0  GLUCOSEU NEGATIVE  HGBUR NEGATIVE  BILIRUBINUR NEGATIVE  KETONESUR NEGATIVE  PROTEINUR NEGATIVE  UROBILINOGEN 1.0  NITRITE NEGATIVE  LEUKOCYTESUR NEGATIVE   Lipid Panel    Component Value Date/Time   CHOL 182 08/01/2012 0710   TRIG 50 08/01/2012 0710   HDL 56 08/01/2012 0710   CHOLHDL 3.3 08/01/2012 0710   VLDL 10 08/01/2012 0710   LDLCALC 116* 08/01/2012 0710   HgbA1C  Lab Results  Component Value Date  HGBA1C 6.2* 08/01/2012    Urine Drug Screen:     Component Value Date/Time   LABOPIA NONE DETECTED 08/01/2012 0941   LABOPIA NEG 08/26/2008 0238   COCAINSCRNUR NONE DETECTED 08/01/2012 0941   COCAINSCRNUR NEG 08/26/2008 0238   LABBENZ NONE DETECTED 08/01/2012 0941   LABBENZ NEG 08/26/2008 0238   AMPHETMU NONE DETECTED 08/01/2012 0941   AMPHETMU NEG 08/26/2008 0238   THCU NONE DETECTED 08/01/2012 0941   LABBARB NONE DETECTED 08/01/2012 0941    Alcohol Level:  Lab 08/01/12 0710  ETH <11    CT of the brain  08/01/2012  No acute intracranial abnormalities demonstrated.    MRI of the brain  08/01/2012  1.  Acute lacunar type infarct posterior limb right internal capsule.  No mass effect or hemorrhage. 2.  Underlying signal changes suggestive of chronic small vessel disease.   MRA of the neck  08/01/2012  1.  Bilateral carotid bifurcation atherosclerosis but no significant stenosis results. 2.   Tortuosity.   MRA of the brain  08/01/2012 : 1.  Mild irregularity at the right MCA M1 segment.  Otherwise negative anterior circulation allowing for a degree of motion artifact. 2.  Negative posterior circulation.    2D Echocardiogram  EF 60-65% with no source of embolus.   Carotid Doppler    CXR  08/01/2012   No acute cardiopulmonary disease.   EKG  normal sinus rhythm.   Therapy Recommendations no PT or OT follow up  Physical Exam  Awake alert. Afebrile. Head is nontraumatic. Neck is supple without bruit. Hearing is normal. Cardiac exam no murmur or gallop. Lungs are clear to auscultation. Distal pulses are well felt.  Neurological Exam : Awake alert oriented x 3 normal speech and language.fundi were not visualized. Vision acuity and   fields appear normal Mild left lower face asymmetry. Tongue midline. No drift. Mild diminished fine finger movements on left. Orbits right over left upper extremity. Mild left grip weak.. Normal sensation . Normal coordination.  ASSESSMENT [name] is a 57 y.o. male presenting with left sided weakness and slurred speech. Imaging confirms a right PLIC infarct. Infarct felt to be thrombotic secondary to small vessel disease.  Work up underway. On no antiplatelets prior to admission. Now on aspirin 81 mg orally every day for secondary stroke prevention. Patient with no  resultant neuro symptoms, left hemiparesis and slurred speech have resolved.  Hypertension, non compliant with medications Cocaine abuse Hx etoh abuse Hyperlipidemia, LDL 116, not on statin PTA, on zocor now, goal LDL < 100 HgbA1c 6.2  Hospital day # 1  TREATMENT/PLAN  Continue aspirin 81 mg orally every day for secondary stroke prevention.  F/u carotid doppler  Ok for discharge after carotid doppler is completed  Follow up Dr. Pearlean Brownie in 2 mos.  Annie Main, MSN, RN, ANVP-BC, ANP-BC, [facility] Pager: [phone] 08/02/2012 9:17 AM  I have personally  obtained a history, examined the patient, evaluated imaging results, and formulated the assessment and plan of care. I agree with the above.  Delia Heady, MD Medical Director [facility] Pager: [phone] 08/02/2012 5:26 PM

## 2012-08-02 NOTE — Evaluation (Signed)
Physical Therapy Evaluation Patient Details Name: [name] MRN: [id number] DOB: 1956-04-23 Today's Date: 08/02/2012 Time: 8295-6213 PT Time Calculation (min): 13 min  PT Assessment / Plan / Recommendation Clinical Impression  Patient is a pleasent 57 y.o male who presented with stroke like symptoms. Patient currently demonstrates baseline independence with no residula deficits apparent. Patient has no PT needs at this time. Rec d/c    PT Assessment  Patent does not need any further PT services    Follow Up Recommendations  No PT follow up          Equipment Recommendations  None recommended by PT          Precautions / Restrictions Restrictions Weight Bearing Restrictions: No         Mobility  Bed Mobility Bed Mobility: Supine to Sit;Sit to Supine;Sitting - Scoot to Edge of Bed Supine to Sit: 7: Independent Sitting - Scoot to Edge of Bed: 7: Independent Sit to Supine: 7: Independent Transfers Transfers: Sit to Stand;Stand to Sit Sit to Stand: 7: Independent Stand to Sit: 7: Independent Ambulation/Gait Ambulation/Gait Assistance: 7: Independent Ambulation Distance (Feet): 30 Feet Assistive device: None Ambulation/Gait Assistance Details: no assist Gait Pattern: Within Functional Limits Gait velocity: WNL General Gait Details: patient very steady      Visit Information  Last PT Received On: 08/02/12 Assistance Needed: +1    Subjective Data  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 Patient Stated Goal: to go home   Prior Functioning  Home Living Lives With: Alone Available Help at Discharge: Family;Friend(s);Available PRN/intermittently Type of Home: Apartment Home Access: Level entry Home Layout: One level Bathroom Shower/Tub: Tub only;Curtain Bathroom Toilet: Standard Home Adaptive Equipment: None Prior Function Level of Independence: Independent Able to Take Stairs?: Yes Driving: Yes 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: No difficulties Dominant Hand: Left    Cognition  Overall Cognitive Status: Appears within functional limits for tasks assessed/performed Arousal/Alertness: Awake/alert Orientation Level: Appears intact for tasks assessed Behavior During Session: [facility] for tasks performed    Extremity/Trunk Assessment Right Upper Extremity Assessment RUE ROM/Strength/Tone: Within functional levels RUE Sensation: WFL - Light Touch;WFL - Proprioception RUE Coordination: WFL - gross/fine motor Left Upper Extremity Assessment LUE ROM/Strength/Tone: Within functional levels LUE Sensation: WFL - Light Touch;WFL - Proprioception LUE Coordination: WFL - gross/fine motor Right Lower Extremity Assessment RLE ROM/Strength/Tone: Within functional levels RLE Sensation: WFL - Light Touch;WFL - Proprioception RLE Coordination: WFL - gross/fine motor Left Lower Extremity Assessment LLE ROM/Strength/Tone: Within functional levels LLE Sensation: WFL - Light Touch;WFL - Proprioception LLE Coordination: WFL - gross/fine motor Trunk Assessment Trunk Assessment: Normal   Balance Balance Balance Assessed: Yes High Level Balance High Level Balance Activites: Side stepping;Backward walking;Direction changes;Turns;Sudden stops;Head turns High Level Balance Comments: steady  End of Session PT - End of Session Activity Tolerance: Patient tolerated treatment well Patient left: in bed;with call bell/phone within reach;with family/visitor present Nurse Communication: Mobility status  GP     Fabio Asa 08/02/2012, 10:03 AM Charlotte Crumb, PT DPT  [phone]

## 2012-08-02 NOTE — Discharge Summary (Signed)
Internal Medicine Teaching [facility] Discharge Note  Name: [name] MRN: [id number] DOB: 26-Dec-1955 57 y.o.  Date of Admission: 08/01/2012  6:33 AM Date of Discharge: 08/02/2012 Attending Physician: Ileana Roup, MD  Discharge Diagnosis: Principal Problem:  *Acute lacunar stroke, R internal capsule Active Problems:  HYPERTENSION   Discharge Medications:   Medication List     As of 08/02/2012  3:14 PM    TAKE these medications         aspirin 81 MG EC tablet   Take 1 tablet (81 mg total) by mouth daily.      pravastatin 20 MG tablet   Commonly known as: PRAVACHOL   Take 1 tablet (20 mg total) by mouth daily.          Disposition and follow-up:   [name] was discharged from [facility] in good  condition.  At the hospital follow up visit please address  1) Treatment of hypertension in setting of acute stroke [name] presented w L-sided upper and lower extremity weakness and slurred speech. He was noted to have small R internal capsule stroke on MRI. Echo and dopplers nonrevealing. Discharged on statin (pravastatin) and ASA 81mg  for secondary prevention. Please assess compliance with meds. Please also check blood pressure and prescribe antihypertensive agent as appropriate, recommend ACE-inhibitor. Thank you   Follow-up Appointments: Follow-up Information    Follow up with Denton Ar, MD. On 08/09/2012. (9:15 AM)    Contact information:   [address] [place] [address] [phone]       Follow up with Gates Rigg, MD. Schedule an appointment as soon as possible for a visit in 2 months. (stroke clinic)    Contact information:   [address] [facility] [place] [address] [phone]         Discharge Orders    Future Appointments: Provider: Department: Dept Phone: Center:   08/09/2012 9:15 AM Larey Seat, MD [facility]  [phone] [facility]     Future Orders Please Complete By Expires   Diet - low sodium heart healthy      Increase activity slowly      Call MD for:  extreme fatigue      Call MD for:  persistant dizziness or light-headedness      Call MD for:  difficulty breathing, headache or visual disturbances         Consultations: Treatment Team:  Md Stroke, MD  Procedures Performed:  Ct Head Wo Contrast  08/01/2012  *RADIOLOGY REPORT*  Clinical Data: Progressive left-sided weakness.  Unable to walk. Stroke symptoms.  CT HEAD WITHOUT CONTRAST  Technique:  Contiguous axial images were obtained from the base of the skull through the vertex without contrast.  Comparison: 07/09/2005  Findings: The ventricles and sulci are symmetrical without significant effacement, displacement, or dilatation. No mass effect or midline shift. No abnormal extra-axial fluid collections.  There are patchy low attenuation changes in the deep white matter most consistent with small vessel ischemic change.  Old lacunar infarct in the anterior left centrum [place].  No definitive changes of acute infarct.  The grey-white matter junction is distinct. Basal cisterns are not effaced. No acute intracranial hemorrhage. No depressed skull fractures. Visualized portions of the paranasal sinuses demonstrate mucosal membrane thickening in the ethmoid sinuses.  No acute air-fluid levels.  Mastoid air cells are not opacified.  Vascular calcifications.  IMPRESSION: No acute intracranial abnormalities  demonstrated.  Results telephoned to Dr. Fonnie Jarvis at [phone] hours on 08/01/2012.   Original Report Authenticated By: Burman Nieves, M.D.    [name] Wo Contrast  08/01/2012  *RADIOLOGY REPORT*  Clinical Data:  57 year old male with acute onset left extremity weakness with stuttering course, now improved.  Contrast: 14mL MULTIHANCE GADOBENATE DIMEGLUMINE 529 MG/ML IV SOLN  Comparison: Head CTs 07/09/2005.  MRI HEAD WITHOUT AND WITH CONTRAST   Technique: Multiplanar, multiecho pulse sequences of the brain and surrounding structures were obtained according to standard protocol without and with intravenous contrast.  Findings:  Linear area of restricted diffusion posterior limb right internal capsule (series 4 image 15, series 400 image 15).  Faint associated T2 hyperintensity.  No mass effect or associated hemorrhage.  No other restricted diffusion identified. Major intracranial vascular flow voids are preserved, MRA findings are below.  Scattered dilated perivascular spaces.  Scattered T2 and FLAIR hyperintensity in the cerebral white matter, patchy and confluent periatrial involvement.  Evidence of chronic left dorsal thalamus lacunar infarct.  Brainstem, cerebellum, and visualized cervical spine within normal limits.  Negative pituitary.  No cortical encephalomalacia. No abnormal enhancement identified.  Visualized orbit soft tissues are within normal limits.  Minor paranasal sinus mucosal thickening.  Mastoids are clear.  Normal bone marrow signal.  Negative scalp soft tissues.  IMPRESSION: 1.  Acute lacunar type infarct posterior limb right internal capsule.  No mass effect or hemorrhage. 2.  Underlying signal changes suggestive of chronic small vessel disease. 3.  MRA findings below.  MRA NECK WITHOUT AND WITH CONTRAST  Technique:  Angiographic images of the neck were obtained using MRA technique without and with intravenous contrast.  Carotid stenosis measurements (when applicable) are obtained utilizing NASCET criteria, using the distal internal carotid diameter as the denominator.  Findings:  Precontrast time-of-flight images demonstrate antegrade flow in both carotid and vertebral arteries.  Tortuous vertebral arteries.  Partially retropharyngeal course of the left carotid artery.  Continued antegrade flow to the skull base.  Postcontrast MRA images demonstrate a three-vessel arch configuration.  Great vessel origins appear within normal limits.   Normal right common carotid artery.  At the right carotid bifurcation there is irregularity at the right ICA origin and posterior bulb.  No significant stenosis occurs.  Cervical right ICA normal aside from mild tortuosity.  Left common carotid artery within normal limits.  Irregularity at the left carotid bifurcation involving the medial left ICA origin and bulb.  Still, no significant stenosis occurs.  Cervical left ICA normal elsewhere except for tortuosity.  Codominant vertebral arteries with intermittent tortuosity in the neck.  Both vertebral artery origins are within normal limits.  No cervical vertebral artery stenosis.  IMPRESSION: 1.  Bilateral carotid bifurcation atherosclerosis but no significant stenosis results. 2.  Tortuosity. 3.  Otherwise negative neck MRA.  See intracranial findings below.  MRA HEAD WITHOUT CONTRAST  Technique: Angiographic images of the Circle of Willis were obtained using MRA technique without  intravenous contrast.  Findings:  Study is intermittently degraded by motion artifact despite repeated imaging attempts.  Antegrade flow in the posterior circulation.  Codominant distal vertebral arteries.  Normal right PICA.  Dominant left AICA. Normal vertebrobasilar junction.  No basilar stenosis.  SCA and left PCA origins are within normal limits.  Fetal right PCA origin. Small left posterior communicating artery also present.  Bilateral PCA branches are within normal limits.  Antegrade flow in both ICA siphons. Mild ICA irregularity.  No ICA stenosis.  Posterior communicating artery  origins are within normal limits.  Carotid termini are patent.  MCA and ACA origins are within normal limits.  Anterior communicating artery within normal limits.  Visualized ACA branches within normal limits.  Visualized left MCA branches within normal limits.  Mild irregularity of the right MCA M1 segment.  Both MCA demonstrate fairly early branching.  Visualized right MCA branches are within normal  limits.  IMPRESSION: 1.  Mild irregularity at the right MCA M1 segment.  Otherwise negative anterior circulation allowing for a degree of motion artifact. 2.  Negative posterior circulation.   Original Report Authenticated By: Erskine Speed, M.D.    Mr Angiogram Neck W Wo Contrast  08/01/2012  *RADIOLOGY REPORT*  Clinical Data:  57 year old male with acute onset left extremity weakness with stuttering course, now improved.  Contrast: 14mL MULTIHANCE GADOBENATE DIMEGLUMINE 529 MG/ML IV SOLN  Comparison: Head CTs 07/09/2005.  MRI HEAD WITHOUT AND WITH CONTRAST  Technique: Multiplanar, multiecho pulse sequences of the brain and surrounding structures were obtained according to standard protocol without and with intravenous contrast.  Findings:  Linear area of restricted diffusion posterior limb right internal capsule (series 4 image 15, series 400 image 15).  Faint associated T2 hyperintensity.  No mass effect or associated hemorrhage.  No other restricted diffusion identified. Major intracranial vascular flow voids are preserved, MRA findings are below.  Scattered dilated perivascular spaces.  Scattered T2 and FLAIR hyperintensity in the cerebral white matter, patchy and confluent periatrial involvement.  Evidence of chronic left dorsal thalamus lacunar infarct.  Brainstem, cerebellum, and visualized cervical spine within normal limits.  Negative pituitary.  No cortical encephalomalacia. No abnormal enhancement identified.  Visualized orbit soft tissues are within normal limits.  Minor paranasal sinus mucosal thickening.  Mastoids are clear.  Normal bone marrow signal.  Negative scalp soft tissues.  IMPRESSION: 1.  Acute lacunar type infarct posterior limb right internal capsule.  No mass effect or hemorrhage. 2.  Underlying signal changes suggestive of chronic small vessel disease. 3.  MRA findings below.  MRA NECK WITHOUT AND WITH CONTRAST  Technique:  Angiographic images of the neck were obtained using MRA  technique without and with intravenous contrast.  Carotid stenosis measurements (when applicable) are obtained utilizing NASCET criteria, using the distal internal carotid diameter as the denominator.  Findings:  Precontrast time-of-flight images demonstrate antegrade flow in both carotid and vertebral arteries.  Tortuous vertebral arteries.  Partially retropharyngeal course of the left carotid artery.  Continued antegrade flow to the skull base.  Postcontrast MRA images demonstrate a three-vessel arch configuration.  Great vessel origins appear within normal limits.  Normal right common carotid artery.  At the right carotid bifurcation there is irregularity at the right ICA origin and posterior bulb.  No significant stenosis occurs.  Cervical right ICA normal aside from mild tortuosity.  Left common carotid artery within normal limits.  Irregularity at the left carotid bifurcation involving the medial left ICA origin and bulb.  Still, no significant stenosis occurs.  Cervical left ICA normal elsewhere except for tortuosity.  Codominant vertebral arteries with intermittent tortuosity in the neck.  Both vertebral artery origins are within normal limits.  No cervical vertebral artery stenosis.  IMPRESSION: 1.  Bilateral carotid bifurcation atherosclerosis but no significant stenosis results. 2.  Tortuosity. 3.  Otherwise negative neck MRA.  See intracranial findings below.  MRA HEAD WITHOUT CONTRAST  Technique: Angiographic images of the Circle of Willis were obtained using MRA technique without  intravenous contrast.  Findings:  Study  is intermittently degraded by motion artifact despite repeated imaging attempts.  Antegrade flow in the posterior circulation.  Codominant distal vertebral arteries.  Normal right PICA.  Dominant left AICA. Normal vertebrobasilar junction.  No basilar stenosis.  SCA and left PCA origins are within normal limits.  Fetal right PCA origin. Small left posterior communicating artery also  present.  Bilateral PCA branches are within normal limits.  Antegrade flow in both ICA siphons. Mild ICA irregularity.  No ICA stenosis.  Posterior communicating artery origins are within normal limits.  Carotid termini are patent.  MCA and ACA origins are within normal limits.  Anterior communicating artery within normal limits.  Visualized ACA branches within normal limits.  Visualized left MCA branches within normal limits.  Mild irregularity of the right MCA M1 segment.  Both MCA demonstrate fairly early branching.  Visualized right MCA branches are within normal limits.  IMPRESSION: 1.  Mild irregularity at the right MCA M1 segment.  Otherwise negative anterior circulation allowing for a degree of motion artifact. 2.  Negative posterior circulation.   Original Report Authenticated By: Erskine Speed, M.D.    Mr Laqueta Jean Wo Contrast  08/01/2012  *RADIOLOGY REPORT*  Clinical Data:  57 year old male with acute onset left extremity weakness with stuttering course, now improved.  Contrast: 14mL MULTIHANCE GADOBENATE DIMEGLUMINE 529 MG/ML IV SOLN  Comparison: Head CTs 07/09/2005.  MRI HEAD WITHOUT AND WITH CONTRAST  Technique: Multiplanar, multiecho pulse sequences of the brain and surrounding structures were obtained according to standard protocol without and with intravenous contrast.  Findings:  Linear area of restricted diffusion posterior limb right internal capsule (series 4 image 15, series 400 image 15).  Faint associated T2 hyperintensity.  No mass effect or associated hemorrhage.  No other restricted diffusion identified. Major intracranial vascular flow voids are preserved, MRA findings are below.  Scattered dilated perivascular spaces.  Scattered T2 and FLAIR hyperintensity in the cerebral white matter, patchy and confluent periatrial involvement.  Evidence of chronic left dorsal thalamus lacunar infarct.  Brainstem, cerebellum, and visualized cervical spine within normal limits.  Negative pituitary.  No  cortical encephalomalacia. No abnormal enhancement identified.  Visualized orbit soft tissues are within normal limits.  Minor paranasal sinus mucosal thickening.  Mastoids are clear.  Normal bone marrow signal.  Negative scalp soft tissues.  IMPRESSION: 1.  Acute lacunar type infarct posterior limb right internal capsule.  No mass effect or hemorrhage. 2.  Underlying signal changes suggestive of chronic small vessel disease. 3.  MRA findings below.  MRA NECK WITHOUT AND WITH CONTRAST  Technique:  Angiographic images of the neck were obtained using MRA technique without and with intravenous contrast.  Carotid stenosis measurements (when applicable) are obtained utilizing NASCET criteria, using the distal internal carotid diameter as the denominator.  Findings:  Precontrast time-of-flight images demonstrate antegrade flow in both carotid and vertebral arteries.  Tortuous vertebral arteries.  Partially retropharyngeal course of the left carotid artery.  Continued antegrade flow to the skull base.  Postcontrast MRA images demonstrate a three-vessel arch configuration.  Great vessel origins appear within normal limits.  Normal right common carotid artery.  At the right carotid bifurcation there is irregularity at the right ICA origin and posterior bulb.  No significant stenosis occurs.  Cervical right ICA normal aside from mild tortuosity.  Left common carotid artery within normal limits.  Irregularity at the left carotid bifurcation involving the medial left ICA origin and bulb.  Still, no significant stenosis occurs.  Cervical left ICA  normal elsewhere except for tortuosity.  Codominant vertebral arteries with intermittent tortuosity in the neck.  Both vertebral artery origins are within normal limits.  No cervical vertebral artery stenosis.  IMPRESSION: 1.  Bilateral carotid bifurcation atherosclerosis but no significant stenosis results. 2.  Tortuosity. 3.  Otherwise negative neck MRA.  See intracranial findings  below.  MRA HEAD WITHOUT CONTRAST  Technique: Angiographic images of the Circle of Willis were obtained using MRA technique without  intravenous contrast.  Findings:  Study is intermittently degraded by motion artifact despite repeated imaging attempts.  Antegrade flow in the posterior circulation.  Codominant distal vertebral arteries.  Normal right PICA.  Dominant left AICA. Normal vertebrobasilar junction.  No basilar stenosis.  SCA and left PCA origins are within normal limits.  Fetal right PCA origin. Small left posterior communicating artery also present.  Bilateral PCA branches are within normal limits.  Antegrade flow in both ICA siphons. Mild ICA irregularity.  No ICA stenosis.  Posterior communicating artery origins are within normal limits.  Carotid termini are patent.  MCA and ACA origins are within normal limits.  Anterior communicating artery within normal limits.  Visualized ACA branches within normal limits.  Visualized left MCA branches within normal limits.  Mild irregularity of the right MCA M1 segment.  Both MCA demonstrate fairly early branching.  Visualized right MCA branches are within normal limits.  IMPRESSION: 1.  Mild irregularity at the right MCA M1 segment.  Otherwise negative anterior circulation allowing for a degree of motion artifact. 2.  Negative posterior circulation.   Original Report Authenticated By: Erskine Speed, M.D.    Dg Chest Port 1 View  08/01/2012  *RADIOLOGY REPORT*  Clinical Data: Left-sided weakness  PORTABLE CHEST - 1 VIEW  Comparison: 10/31/2011; 08/22/2009; 09/18/2008  Findings: Grossly unchanged cardiac silhouette and mediastinal contours with mild tortuosity of the thoracic aorta.  No focal airspace opacities.  No definite pleural effusion, though note, the bilateral costophrenic angles are excluded from view.  No pneumothorax.  Unchanged bones including old/healed left-sided eighth rib fracture.  IMPRESSION: No acute cardiopulmonary disease.   Original Report  Authenticated By: Tacey Ruiz, MD     TTE 08/01/12: Study Conclusions Left ventricle: The cavity size was normal. Wall thickness was increased in a pattern of mild LVH. Systolic function was normal. The estimated ejection fraction was in the range of 60% to 65%.   Carotid Dopplers 08/02/12: Preliminary report No significant ICA stenosis. Vertebral artery flow is antegrade.  Admission HPI:  Mr. Daoust is a 57 yo M with PMH of cocaine abuse and HTN (has not been taking medications for years--used to take HCTZ) presents to the ED for left sided weakness when he woke up at [facility] on day of admission. He was normal when he went to bed at 9:30PM. He states that he felt weak on his left side was unable to raise his left arm or leg. He then went back to bed and then woke up at 5:30AM when he experience the left sided weakness again. He states that the episodes last about 10-15 mins, intermittent. He thought he was able to go to work so he walked to the bus stop and started to feel the symptom again in addition to slurred speech and left facial droop. He denies any headache, visual changes. He did have some neck numbness/ with pain that radiates down his shoulder blades in the last few weeks and has been taking some arthritis medicine which now resolved. He denies  any chest pain, SOB, fever, N/V, abd pain. He still smokes 1pack every 3 days x 30 years. Has not used cocaine in the past 4 years. He has not been on ASA in the past. No personal history of stroke or family history of stroke.   Hospital Course by problem list: 1. Acute stroke of R internal capsule, posterior limb  Presented 08/01/12 with L UE and LE weakness as well as L facial weakness and slurred speech. Outside of window for tPA due to unknown time of onset. On admission, CT scan was negative. MRI demonstrated acute infarct in posterior limb of R internal capsule. Neuro was consulted and saw the patient in the ED. He received full dose ASA on day of  admission and 81mg  daily subsequently for secondary prophylaxis. Patient had  full symptom resolution within 24 hours of admission. No arrhythmias on EKG or tele.  No significant intracranial stenoses on MRA. Echo wnl w EF 60-65%. Carotid dopplers no significant carotid stenoses (prelim). HbA1c borderline elevated at 6.2. Cholesterol notable for elevated LDL (116). No known family history of stroke. Other RF include 30 pack year smoking history and HTN.  He was started on statin therapy (simvastatin). Antihypertensives held for permissive HTN. Suspect ultimately stroke related to thrombotic small vessel disease. Assessed by PT/OT, no PT/OT needs. Received smoking cessation counseling and flu shot. To follow up with Dr. Pearlean Brownie in a couple of months and [facility] next week. Will need addition of antiHTN at that time. Patient will be discharged on ASA 81mg  and pravastatin 20mg  (can only afford meds on $4 list).  2. HTN  Patient has history of HTN, haf not been taking any antiHTN at home and no regular PCP. BP max here 186/115 here, on day of discharge 134/71. Permissive HTN in setting of acute stroke as per #1. Will start antihypertensive therapy at outpatient appointment next week (recommend ACE-i).    Discharge Vitals:  BP 147/82  Pulse 66  Temp 98.2 F (36.8 C) (Oral)  Resp 16  Ht 5\' 6"  (1.676 m)  Wt 139 lb 5.3 oz (63.2 kg)  BMI 22.49 kg/m2  SpO2 98%   Signed: Bronson Curb 08/02/2012, 3:14 PM   Time Spent on Discharge: 35 min  Services Ordered on Discharge: none Equipment Ordered on Discharge: none

## 2012-08-02 NOTE — Progress Notes (Signed)
VASCULAR LAB PRELIMINARY  PRELIMINARY  PRELIMINARY  PRELIMINARY  Carotid Dopplers completed.    Preliminary report:  No significant ICA stenosis.  Vertebral artery flow is antegrade.  Shaymus Eveleth, RVT 08/02/2012, 11:00 AM

## 2012-08-02 NOTE — H&P (Signed)
Internal Medicine Attending Admission Note Date: 08/02/2012  Patient name: [name] Medical record number: [id number] Date of birth: 04/12/1956 Age: 57 y.o. Gender: male  I saw and evaluated the patient. I reviewed the resident's note and I agree with the resident's findings and plan as documented in the resident's note, with the following additional comments.  Chief Complaint(s): Left-sided weakness, slurred speech, facial droop  History - key components related to admission: Patient is a 57 year old man with history of hypertension and past substance abuse admitted with left-sided weakness that he noted when he woke up on the morning of admission.  He also developed slurred speech and left facial droop.  His symptoms have now completely resolved.  He denies any associated symptoms including chest pain, shortness of breath, abdominal pain, nausea, vomiting, or fever.  Physical Exam - key components related to admission:  Filed Vitals:   08/02/12 0224 08/02/12 0500 08/02/12 0516 08/02/12 1132  BP: 106/82  134/71 130/83  Pulse: 62  55 60  Temp: 97.8 F (36.6 C)  98 F (36.7 C) 97.5 F (36.4 C)  TempSrc: Oral  Oral Oral  Resp: 16  16 18   Height:      Weight:  139 lb 5.3 oz (63.2 kg)    SpO2: 98%  99% 98%   General: Alert, no distress Lungs: Clear Heart: Regular; S1-S2, no S3, no S4, no murmurs Abdomen: Bowel sounds present, soft, nontender Extremities: No edema Neurologic: Alert and oriented; cranial nerves II through XII intact; motor 5 over 5.   Lab results:   Basic Metabolic Panel:  Basename 08/01/12 0727 08/01/12 0709  NA 141 139  K 3.9 3.9  CL 102 103  CO2 -- 28  GLUCOSE 100* 103*  BUN 17 18  CREATININE 0.90 0.67  CALCIUM -- 8.8  MG -- --  PHOS -- --   Liver Function Tests:  [facility] 08/01/12 0709  AST 22  ALT 16  ALKPHOS 69  BILITOT 0.3  PROT 6.6  ALBUMIN 3.6    CBC:  Basename 08/01/12 0727 08/01/12 0709  WBC -- 3.5*  NEUTROABS -- 1.6*    HGB 16.0 15.2  HCT 47.0 44.6  MCV -- 88.7  PLT -- 220    Cardiac Enzymes:  Basename 08/01/12 0710  CKTOTAL --  CKMB --  CKMBINDEX --  TROPONINI <0.30    CBG:  Basename 08/01/12 0718  GLUCAP 91   Hemoglobin A1C:  Basename 08/01/12 0709  HGBA1C 6.2*   Fasting Lipid Panel:  Basename 08/01/12 0710  CHOL 182  HDL 56  LDLCALC 116*  TRIG 50  CHOLHDL 3.3  LDLDIRECT --     Coagulation:  Basename 08/01/12 0709  INR 1.04   Drugs of Abuse     Component Value Date/Time   LABOPIA NONE DETECTED 08/01/2012 0941   COCAINSCRNUR NONE DETECTED 08/01/2012 0941   LABBENZ NONE DETECTED 08/01/2012 0941   AMPHETMU NONE DETECTED 08/01/2012 0941   THCU NONE DETECTED 08/01/2012 0941   LABBARB NONE DETECTED 08/01/2012 0941    Alcohol Level:  Basename 08/01/12 0710  ETH <11    Imaging results:  Ct Head Wo Contrast  08/01/2012  *RADIOLOGY REPORT*  Clinical Data: Progressive left-sided weakness.  Unable to walk. Stroke symptoms.  CT HEAD WITHOUT CONTRAST  Technique:  Contiguous axial images were obtained from the base of the skull through the vertex without contrast.  Comparison: 07/09/2005  Findings: The ventricles and sulci are symmetrical without significant effacement, displacement, or dilatation. No mass effect  or midline shift. No abnormal extra-axial fluid collections.  There are patchy low attenuation changes in the deep white matter most consistent with small vessel ischemic change.  Old lacunar infarct in the anterior left centrum [place].  No definitive changes of acute infarct.  The grey-white matter junction is distinct. Basal cisterns are not effaced. No acute intracranial hemorrhage. No depressed skull fractures. Visualized portions of the paranasal sinuses demonstrate mucosal membrane thickening in the ethmoid sinuses.  No acute air-fluid levels.  Mastoid air cells are not opacified.  Vascular calcifications.  IMPRESSION: No acute intracranial abnormalities  demonstrated.  Results telephoned to Dr. Fonnie Jarvis at [phone] hours on 08/01/2012.   Original Report Authenticated By: Burman Nieves, M.D.    Mr [facility] Wo Contrast  08/01/2012  *RADIOLOGY REPORT*  Clinical Data:  57 year old male with acute onset left extremity weakness with stuttering course, now improved.  Contrast: 14mL MULTIHANCE GADOBENATE DIMEGLUMINE 529 MG/ML IV SOLN  Comparison: Head CTs 07/09/2005.  MRI HEAD WITHOUT AND WITH CONTRAST  Technique: Multiplanar, multiecho pulse sequences of the brain and surrounding structures were obtained according to standard protocol without and with intravenous contrast.  Findings:  Linear area of restricted diffusion posterior limb right internal capsule (series 4 image 15, series 400 image 15).  Faint associated T2 hyperintensity.  No mass effect or associated hemorrhage.  No other restricted diffusion identified. Major intracranial vascular flow voids are preserved, MRA findings are below.  Scattered dilated perivascular spaces.  Scattered T2 and FLAIR hyperintensity in the cerebral white matter, patchy and confluent periatrial involvement.  Evidence of chronic left dorsal thalamus lacunar infarct.  Brainstem, cerebellum, and visualized cervical spine within normal limits.  Negative pituitary.  No cortical encephalomalacia. No abnormal enhancement identified.  Visualized orbit soft tissues are within normal limits.  Minor paranasal sinus mucosal thickening.  Mastoids are clear.  Normal bone marrow signal.  Negative scalp soft tissues.  IMPRESSION: 1.  Acute lacunar type infarct posterior limb right internal capsule.  No mass effect or hemorrhage. 2.  Underlying signal changes suggestive of chronic small vessel disease. 3.  MRA findings below.  MRA NECK WITHOUT AND WITH CONTRAST  Technique:  Angiographic images of the neck were obtained using MRA technique without and with intravenous contrast.  Carotid stenosis measurements (when applicable) are obtained utilizing  NASCET criteria, using the distal internal carotid diameter as the denominator.  Findings:  Precontrast time-of-flight images demonstrate antegrade flow in both carotid and vertebral arteries.  Tortuous vertebral arteries.  Partially retropharyngeal course of the left carotid artery.  Continued antegrade flow to the skull base.  Postcontrast MRA images demonstrate a three-vessel arch configuration.  Great vessel origins appear within normal limits.  Normal right common carotid artery.  At the right carotid bifurcation there is irregularity at the right ICA origin and posterior bulb.  No significant stenosis occurs.  Cervical right ICA normal aside from mild tortuosity.  Left common carotid artery within normal limits.  Irregularity at the left carotid bifurcation involving the medial left ICA origin and bulb.  Still, no significant stenosis occurs.  Cervical left ICA normal elsewhere except for tortuosity.  Codominant vertebral arteries with intermittent tortuosity in the neck.  Both vertebral artery origins are within normal limits.  No cervical vertebral artery stenosis.  IMPRESSION: 1.  Bilateral carotid bifurcation atherosclerosis but no significant stenosis results. 2.  Tortuosity. 3.  Otherwise negative neck MRA.  See intracranial findings below.  MRA HEAD WITHOUT CONTRAST  Technique: Angiographic images of the Circle  of Willis were obtained using MRA technique without  intravenous contrast.  Findings:  Study is intermittently degraded by motion artifact despite repeated imaging attempts.  Antegrade flow in the posterior circulation.  Codominant distal vertebral arteries.  Normal right PICA.  Dominant left AICA. Normal vertebrobasilar junction.  No basilar stenosis.  SCA and left PCA origins are within normal limits.  Fetal right PCA origin. Small left posterior communicating artery also present.  Bilateral PCA branches are within normal limits.  Antegrade flow in both ICA siphons. Mild ICA irregularity.  No  ICA stenosis.  Posterior communicating artery origins are within normal limits.  Carotid termini are patent.  MCA and ACA origins are within normal limits.  Anterior communicating artery within normal limits.  Visualized ACA branches within normal limits.  Visualized left MCA branches within normal limits.  Mild irregularity of the right MCA M1 segment.  Both MCA demonstrate fairly early branching.  Visualized right MCA branches are within normal limits.  IMPRESSION: 1.  Mild irregularity at the right MCA M1 segment.  Otherwise negative anterior circulation allowing for a degree of motion artifact. 2.  Negative posterior circulation.   Original Report Authenticated By: Erskine Speed, M.D.    Mr Angiogram Neck W Wo Contrast  08/01/2012  *RADIOLOGY REPORT*  Clinical Data:  57 year old male with acute onset left extremity weakness with stuttering course, now improved.  Contrast: 14mL MULTIHANCE GADOBENATE DIMEGLUMINE 529 MG/ML IV SOLN  Comparison: Head CTs 07/09/2005.  MRI HEAD WITHOUT AND WITH CONTRAST  Technique: Multiplanar, multiecho pulse sequences of the brain and surrounding structures were obtained according to standard protocol without and with intravenous contrast.  Findings:  Linear area of restricted diffusion posterior limb right internal capsule (series 4 image 15, series 400 image 15).  Faint associated T2 hyperintensity.  No mass effect or associated hemorrhage.  No other restricted diffusion identified. Major intracranial vascular flow voids are preserved, MRA findings are below.  Scattered dilated perivascular spaces.  Scattered T2 and FLAIR hyperintensity in the cerebral white matter, patchy and confluent periatrial involvement.  Evidence of chronic left dorsal thalamus lacunar infarct.  Brainstem, cerebellum, and visualized cervical spine within normal limits.  Negative pituitary.  No cortical encephalomalacia. No abnormal enhancement identified.  Visualized orbit soft tissues are within normal  limits.  Minor paranasal sinus mucosal thickening.  Mastoids are clear.  Normal bone marrow signal.  Negative scalp soft tissues.  IMPRESSION: 1.  Acute lacunar type infarct posterior limb right internal capsule.  No mass effect or hemorrhage. 2.  Underlying signal changes suggestive of chronic small vessel disease. 3.  MRA findings below.  MRA NECK WITHOUT AND WITH CONTRAST  Technique:  Angiographic images of the neck were obtained using MRA technique without and with intravenous contrast.  Carotid stenosis measurements (when applicable) are obtained utilizing NASCET criteria, using the distal internal carotid diameter as the denominator.  Findings:  Precontrast time-of-flight images demonstrate antegrade flow in both carotid and vertebral arteries.  Tortuous vertebral arteries.  Partially retropharyngeal course of the left carotid artery.  Continued antegrade flow to the skull base.  Postcontrast MRA images demonstrate a three-vessel arch configuration.  Great vessel origins appear within normal limits.  Normal right common carotid artery.  At the right carotid bifurcation there is irregularity at the right ICA origin and posterior bulb.  No significant stenosis occurs.  Cervical right ICA normal aside from mild tortuosity.  Left common carotid artery within normal limits.  Irregularity at the left carotid bifurcation involving the  medial left ICA origin and bulb.  Still, no significant stenosis occurs.  Cervical left ICA normal elsewhere except for tortuosity.  Codominant vertebral arteries with intermittent tortuosity in the neck.  Both vertebral artery origins are within normal limits.  No cervical vertebral artery stenosis.  IMPRESSION: 1.  Bilateral carotid bifurcation atherosclerosis but no significant stenosis results. 2.  Tortuosity. 3.  Otherwise negative neck MRA.  See intracranial findings below.  MRA HEAD WITHOUT CONTRAST  Technique: Angiographic images of the Circle of Willis were obtained using MRA  technique without  intravenous contrast.  Findings:  Study is intermittently degraded by motion artifact despite repeated imaging attempts.  Antegrade flow in the posterior circulation.  Codominant distal vertebral arteries.  Normal right PICA.  Dominant left AICA. Normal vertebrobasilar junction.  No basilar stenosis.  SCA and left PCA origins are within normal limits.  Fetal right PCA origin. Small left posterior communicating artery also present.  Bilateral PCA branches are within normal limits.  Antegrade flow in both ICA siphons. Mild ICA irregularity.  No ICA stenosis.  Posterior communicating artery origins are within normal limits.  Carotid termini are patent.  MCA and ACA origins are within normal limits.  Anterior communicating artery within normal limits.  Visualized ACA branches within normal limits.  Visualized left MCA branches within normal limits.  Mild irregularity of the right MCA M1 segment.  Both MCA demonstrate fairly early branching.  Visualized right MCA branches are within normal limits.  IMPRESSION: 1.  Mild irregularity at the right MCA M1 segment.  Otherwise negative anterior circulation allowing for a degree of motion artifact. 2.  Negative posterior circulation.   Original Report Authenticated By: Erskine Speed, M.D.    Mr Laqueta Jean Wo Contrast  08/01/2012  *RADIOLOGY REPORT*  Clinical Data:  57 year old male with acute onset left extremity weakness with stuttering course, now improved.  Contrast: 14mL MULTIHANCE GADOBENATE DIMEGLUMINE 529 MG/ML IV SOLN  Comparison: Head CTs 07/09/2005.  MRI HEAD WITHOUT AND WITH CONTRAST  Technique: Multiplanar, multiecho pulse sequences of the brain and surrounding structures were obtained according to standard protocol without and with intravenous contrast.  Findings:  Linear area of restricted diffusion posterior limb right internal capsule (series 4 image 15, series 400 image 15).  Faint associated T2 hyperintensity.  No mass effect or associated  hemorrhage.  No other restricted diffusion identified. Major intracranial vascular flow voids are preserved, MRA findings are below.  Scattered dilated perivascular spaces.  Scattered T2 and FLAIR hyperintensity in the cerebral white matter, patchy and confluent periatrial involvement.  Evidence of chronic left dorsal thalamus lacunar infarct.  Brainstem, cerebellum, and visualized cervical spine within normal limits.  Negative pituitary.  No cortical encephalomalacia. No abnormal enhancement identified.  Visualized orbit soft tissues are within normal limits.  Minor paranasal sinus mucosal thickening.  Mastoids are clear.  Normal bone marrow signal.  Negative scalp soft tissues.  IMPRESSION: 1.  Acute lacunar type infarct posterior limb right internal capsule.  No mass effect or hemorrhage. 2.  Underlying signal changes suggestive of chronic small vessel disease. 3.  MRA findings below.  MRA NECK WITHOUT AND WITH CONTRAST  Technique:  Angiographic images of the neck were obtained using MRA technique without and with intravenous contrast.  Carotid stenosis measurements (when applicable) are obtained utilizing NASCET criteria, using the distal internal carotid diameter as the denominator.  Findings:  Precontrast time-of-flight images demonstrate antegrade flow in both carotid and vertebral arteries.  Tortuous vertebral arteries.  Partially retropharyngeal course  of the left carotid artery.  Continued antegrade flow to the skull base.  Postcontrast MRA images demonstrate a three-vessel arch configuration.  Great vessel origins appear within normal limits.  Normal right common carotid artery.  At the right carotid bifurcation there is irregularity at the right ICA origin and posterior bulb.  No significant stenosis occurs.  Cervical right ICA normal aside from mild tortuosity.  Left common carotid artery within normal limits.  Irregularity at the left carotid bifurcation involving the medial left ICA origin and bulb.   Still, no significant stenosis occurs.  Cervical left ICA normal elsewhere except for tortuosity.  Codominant vertebral arteries with intermittent tortuosity in the neck.  Both vertebral artery origins are within normal limits.  No cervical vertebral artery stenosis.  IMPRESSION: 1.  Bilateral carotid bifurcation atherosclerosis but no significant stenosis results. 2.  Tortuosity. 3.  Otherwise negative neck MRA.  See intracranial findings below.  MRA HEAD WITHOUT CONTRAST  Technique: Angiographic images of the Circle of Willis were obtained using MRA technique without  intravenous contrast.  Findings:  Study is intermittently degraded by motion artifact despite repeated imaging attempts.  Antegrade flow in the posterior circulation.  Codominant distal vertebral arteries.  Normal right PICA.  Dominant left AICA. Normal vertebrobasilar junction.  No basilar stenosis.  SCA and left PCA origins are within normal limits.  Fetal right PCA origin. Small left posterior communicating artery also present.  Bilateral PCA branches are within normal limits.  Antegrade flow in both ICA siphons. Mild ICA irregularity.  No ICA stenosis.  Posterior communicating artery origins are within normal limits.  Carotid termini are patent.  MCA and ACA origins are within normal limits.  Anterior communicating artery within normal limits.  Visualized ACA branches within normal limits.  Visualized left MCA branches within normal limits.  Mild irregularity of the right MCA M1 segment.  Both MCA demonstrate fairly early branching.  Visualized right MCA branches are within normal limits.  IMPRESSION: 1.  Mild irregularity at the right MCA M1 segment.  Otherwise negative anterior circulation allowing for a degree of motion artifact. 2.  Negative posterior circulation.   Original Report Authenticated By: Erskine Speed, M.D.    Dg Chest Port 1 View  08/01/2012  *RADIOLOGY REPORT*  Clinical Data: Left-sided weakness  PORTABLE CHEST - 1 VIEW   Comparison: 10/31/2011; 08/22/2009; 09/18/2008  Findings: Grossly unchanged cardiac silhouette and mediastinal contours with mild tortuosity of the thoracic aorta.  No focal airspace opacities.  No definite pleural effusion, though note, the bilateral costophrenic angles are excluded from view.  No pneumothorax.  Unchanged bones including old/healed left-sided eighth rib fracture.  IMPRESSION: No acute cardiopulmonary disease.   Original Report Authenticated By: Tacey Ruiz, MD     2-D echocardiogram: Left ventricle: The cavity size was normal. Wall thickness was increased in a pattern of mild LVH. Systolic function was normal. The estimated ejection fraction was in the range of 60% to 65%.  Other results: EKG: Sinus rhythm  Assessment & Plan by Problem:  1. Acute lacunar type infarct involving the posterior limb right internal capsule.  Patient's neurological deficits have resolved.  Neurology consult and OT/PT consults were obtained.   Patient was on no antiplatelet therapy prior to admission, so the plan is aspirin for stroke prophylaxis; risk factor control including treatment of hypertension and hyperlipidemia.  2.  Other problems as per resident physician's note.  Principal Problem:  *Acute lacunar stroke Active Problems:  HYPERTENSION

## 2012-08-02 NOTE — Progress Notes (Signed)
Patient discharged this evening, assessments remained unchanged prior to discharged. Discharge instructions given, and patient educated on s/s of stroke and to call if he finds any new symptoms apart from what he had come in with.. Refused his flu shot as ordered.

## 2012-08-03 NOTE — Care Management Note (Signed)
    Page 1 of 1   08/03/2012     8:23:35 AM   CARE MANAGEMENT NOTE 08/03/2012  Patient:  Christopher [name], Christopher [name]   Account Number:  [id number]  Date Initiated:  08/02/2012  Documentation initiated by:  [facility]  Subjective/Objective Assessment:   Admitted with CVA     Action/Plan:   PT/OT/ST-no follow up needs, no equipment needs identified   Anticipated DC Date:  08/03/2012   Anticipated DC Plan:  HOME/SELF CARE      DC Planning Services  CM consult      Choice offered to / List presented to:             Status of service:  Completed, signed off Medicare Important Message given?   (If response is "NO", the following Medicare IM given date fields will be blank) Date Medicare IM given:   Date Additional Medicare IM given:    Discharge Disposition:  HOME/SELF CARE  Per UR Regulation:  Reviewed for med. necessity/level of care/duration of stay  If discussed at Long Length of Stay Meetings, dates discussed:    Comments:  08/02/12 Spoke with patient about medications. Explained that per his MD he will be discharged on [name] med for his cholesterol that he can get at [facility] for $4 for 30 day supply and asiprin which he can get at [facility] as well. [name] states that he usually goes to [facility] and is agreeable to $4 med. Gave him [name] Museum/gallery conservator card. Christopher Cree RN, BSN, CCM

## 2012-08-09 ENCOUNTER — Encounter: Payer: Self-pay | Admitting: Internal Medicine

## 2012-08-09 ENCOUNTER — Ambulatory Visit (INDEPENDENT_AMBULATORY_CARE_PROVIDER_SITE_OTHER): Payer: 59 | Admitting: Internal Medicine

## 2012-08-09 VITALS — BP 136/92 | HR 65 | Temp 96.9°F | Ht 67.5 in | Wt 140.4 lb

## 2012-08-09 DIAGNOSIS — I6350 Cerebral infarction due to unspecified occlusion or stenosis of unspecified cerebral artery: Secondary | ICD-10-CM

## 2012-08-09 DIAGNOSIS — F1010 Alcohol abuse, uncomplicated: Secondary | ICD-10-CM

## 2012-08-09 DIAGNOSIS — I6381 Other cerebral infarction due to occlusion or stenosis of small artery: Secondary | ICD-10-CM

## 2012-08-09 DIAGNOSIS — F1210 Cannabis abuse, uncomplicated: Secondary | ICD-10-CM

## 2012-08-09 DIAGNOSIS — I10 Essential (primary) hypertension: Secondary | ICD-10-CM

## 2012-08-09 DIAGNOSIS — N529 Male erectile dysfunction, unspecified: Secondary | ICD-10-CM | POA: Insufficient documentation

## 2012-08-09 DIAGNOSIS — F1410 Cocaine abuse, uncomplicated: Secondary | ICD-10-CM

## 2012-08-09 MED ORDER — LISINOPRIL 5 MG PO TABS: 5.0000 mg | ORAL_TABLET | Freq: Every day | ORAL | Status: DC

## 2012-08-09 NOTE — Assessment & Plan Note (Signed)
Patient has not used cocaine in several years.

## 2012-08-09 NOTE — Assessment & Plan Note (Signed)
Patient presents for hospital followup for acute lacunar stroke. His blood pressure is 136/92 today and it was markedly elevated during his hospitalization. He has previously been on therapy for hypertension.  -Will start lisinopril 5 mg daily which is on the [employer] $4 list

## 2012-08-09 NOTE — Assessment & Plan Note (Signed)
Patient states that he does not drink alcohol at all anymore. He quit several years ago. I encouraged him to remain abstinent.

## 2012-08-09 NOTE — Assessment & Plan Note (Signed)
Patient presents for hospital followup after an acute lacunar stroke. TTE showed mild LVH, ejection fraction 60-65%. Carotid Dopplers without any significant stenosis, anterograde flow. Stroke thought to be from hypertension. Patient has been taking baby aspirin and pravastatin since discharge. He has also quit smoking cold [place].  -Continue baby aspirin, pravastatin -Applauded his efforts in quitting smoking, offered support -Prescription given for lisinopril 5 mg daily -Followup in one month

## 2012-08-09 NOTE — Patient Instructions (Signed)
Please return to clinic in 1-2 months, sooner if needed.  Please make an appointment with Rudell Cobb to get the orange card.

## 2012-08-09 NOTE — Assessment & Plan Note (Signed)
Patient states that since hospital discharge, he has not had any erections. He has not been able to generate an erection with his partner and he has not noticed any morning erections either. He has never had this problem before. He states he has a good relationship with his partner and is interested in having relations. He is concerned about this new symptom and is worried that this is a side effect from his pravastatin. His last A1c was recently checked and it was 6.2. It is unclear what is causing his impotence at this time. I am not aware of this being a common side effect of pravastatin, though there are some case reports reporting this. I recommended observation for now and gave the patient information on how to do a home nighttime erection test using postage stamp paper to see if he is having organic ED. In the meantime, he will also work on getting the orange card to cover future lab work up.  -Continue to observe -May consider switching to a different statin if his symptoms continue -Followup on home nighttime tumescence test -If still having symptoms at next visit consider doing serum testosterone levels, repeat CMP, repeat A1c, TSH -Return to clinic in one month

## 2012-08-09 NOTE — Progress Notes (Signed)
Internal Medicine Clinic Visit    HPI:  [name] is a 57 y.o. year old male with a history of hypertension, alcohol and cocaine abuse, who presents for hospital followup for acute lacunar stroke. He presented with sudden onset left-sided weakness.  Patient was discharged on aspirin 81 mg and pravastatin. Got medications filled and has been taking them as prescribed. Patient has been doing well since discharge. He feels that his stroke symptoms have almost fully resolved. He does report some mild weakness in the left side and continued pain in the left shoulder. He denies having headaches, numbness or tingling, chest pain, shortness of breath.  Patient is due to followup with Dr. Pearlean Brownie in 2 months  Patient is also complaining of impotence since his hospital discharge. He states that he has not had any erections since starting pravastatin. He tried to have relations with his partner and was unable to generate an erection. He is also noticed absence of morning erections. He has not had this problem before.  Tobacco use: quit completely since discharge. ETOH: Reports none in past 3-4 years. Cocaine use: Not the past 4 years  No family history of stroke   Past Medical History  Diagnosis Date  . ETOH abuse     quit  . Hypertension   . Cocaine abuse     Past Surgical History  Procedure Date  . Hand surgery      ROS:  A complete review of systems was otherwise negative, except as noted in the HPI.  Allergies: Penicillins  Medications: Current Outpatient Prescriptions  Medication Sig Dispense Refill  . aspirin EC 81 MG EC tablet Take 1 tablet (81 mg total) by mouth daily.  30 tablet  5  . pravastatin (PRAVACHOL) 20 MG tablet Take 1 tablet (20 mg total) by mouth daily.  30 tablet  5    History   Social History  . Marital Status: Single    Spouse Name: N/A    Number of Children: N/A  . Years of Education: N/A   Occupational History  . Not on file.   Social History  Main Topics  . Smoking status: Former Games developer  . Smokeless tobacco: Not on file  . Alcohol Use: No     Comment: quite 2011  . Drug Use: No     Comment: h/o cocaine abuse  . Sexually Active: Not on file   Other Topics Concern  . Not on file   Social History Narrative  . No narrative on file    family history is not on file.  Physical Exam Blood pressure 136/92, pulse 65, temperature 96.9 F (36.1 C), temperature source Oral, height 5' 7.5" (1.715 m), weight 140 lb 6.4 oz (63.685 kg), SpO2 99.00%. General:  No acute distress, alert and oriented x 3, well-appearing  HEENT:  PERRL, EOMI, no lymphadenopathy, moist mucous membranes Cardiovascular:  Regular rate and rhythm, no murmurs, rubs or gallops Respiratory:  Clear to auscultation bilaterally, no wheezes, rales, or rhonchi Abdomen:  Soft, nondistended, nontender, normoactive bowel sounds Extremities:  Warm and well-perfused, no clubbing, cyanosis, or edema.  Skin: Warm, dry, no rashes Neuro: Not anxious appearing, no depressed mood, normal affect Cranial Nerves intact Strength is 5 out of 5 throughout, symmetric. Sensation to light touch intact throughout.  Labs: Lab Results  Component Value Date   CREATININE 0.90 08/01/2012   BUN 17 08/01/2012   NA 141 08/01/2012   K 3.9 08/01/2012   CL 102 08/01/2012   CO2  28 08/01/2012   Lab Results  Component Value Date   WBC 3.5* 08/01/2012   HGB 16.0 08/01/2012   HCT 47.0 08/01/2012   MCV 88.7 08/01/2012   PLT 220 08/01/2012      Assessment and Plan:    FOLLOWUP: [name] will follow back up in our clinic in approximately one to two months. [name] knows to call out clinic in the meantime with any questions or new issues.

## 2012-08-22 ENCOUNTER — Ambulatory Visit: Payer: Self-pay

## 2012-09-17 ENCOUNTER — Encounter: Payer: Self-pay | Admitting: Internal Medicine

## 2012-09-18 ENCOUNTER — Encounter: Payer: Self-pay | Admitting: Internal Medicine

## 2012-09-19 ENCOUNTER — Ambulatory Visit: Payer: Self-pay

## 2012-09-19 ENCOUNTER — Ambulatory Visit (INDEPENDENT_AMBULATORY_CARE_PROVIDER_SITE_OTHER): Payer: No Typology Code available for payment source | Admitting: Internal Medicine

## 2012-09-19 ENCOUNTER — Encounter: Payer: Self-pay | Admitting: Internal Medicine

## 2012-09-19 VITALS — BP 160/106 | HR 62 | Temp 97.0°F | Ht 67.5 in | Wt 147.9 lb

## 2012-09-19 DIAGNOSIS — R141 Gas pain: Secondary | ICD-10-CM

## 2012-09-19 DIAGNOSIS — R140 Abdominal distension (gaseous): Secondary | ICD-10-CM

## 2012-09-19 DIAGNOSIS — T464X5A Adverse effect of angiotensin-converting-enzyme inhibitors, initial encounter: Secondary | ICD-10-CM | POA: Insufficient documentation

## 2012-09-19 DIAGNOSIS — K219 Gastro-esophageal reflux disease without esophagitis: Secondary | ICD-10-CM | POA: Insufficient documentation

## 2012-09-19 DIAGNOSIS — R058 Other specified cough: Secondary | ICD-10-CM

## 2012-09-19 DIAGNOSIS — R05 Cough: Secondary | ICD-10-CM | POA: Insufficient documentation

## 2012-09-19 DIAGNOSIS — N529 Male erectile dysfunction, unspecified: Secondary | ICD-10-CM

## 2012-09-19 DIAGNOSIS — I10 Essential (primary) hypertension: Secondary | ICD-10-CM

## 2012-09-19 DIAGNOSIS — R059 Cough, unspecified: Secondary | ICD-10-CM

## 2012-09-19 DIAGNOSIS — Z1211 Encounter for screening for malignant neoplasm of colon: Secondary | ICD-10-CM

## 2012-09-19 DIAGNOSIS — T44905A Adverse effect of unspecified drugs primarily affecting the autonomic nervous system, initial encounter: Secondary | ICD-10-CM

## 2012-09-19 MED ORDER — HYDROCHLOROTHIAZIDE 25 MG PO TABS: 25.0000 mg | ORAL_TABLET | Freq: Every day | ORAL | Status: DC

## 2012-09-19 NOTE — Progress Notes (Signed)
Subjective:   Patient ID: [name] male   DOB: Dec 31, 1955 57 y.o.   MRN: [id number]  HPI: ChristopherChristopher [name] is a 57 y.o. male with PMH of HTN (recently started on ACEi), polysubstance abuse (alcohol and cocaine), and R internal capsule stroke (07/2012) presenting to the clinic today for routine follow up visit.  On his last visit with his PCP, [name] was started on Lisinopril for HTN and he had concerns in regards to impotence since CVA.  Today, he claims he has had improvement in regards to erectile dysfunction.  His main complaints today is 2 weeks of gas and bloating discomfort, worse after meals.  He has a heavy fat diet consisting of burgers.  He reports always having trouble tolerating milk in the past as has his father.  He does eat a lot of cheese.    In regards to his blood pressure, he also complains of a constant dry cough since starting lisinopril.  He claims he has had success with HCTZ in the past and would like to switch to that.  His BP was severely elevated in clinic today 170/101.  He did not take his lisinopril this morning.    Otherwise, he has no other complaints at this time.  He denies any fever, chills, N/V/D, headaches, chest pain, shortness of breath, abdominal pain, or any urinary complaints at this time.  He has no weakness in his extremities post stroke.    Past Medical History  Diagnosis Date  . ETOH abuse     quit  . Hypertension   . Cocaine abuse   . Stroke     posterior limb, R internal capsule stroke 07/2012   Current Outpatient Prescriptions  Medication Sig Dispense Refill  . aspirin EC 81 MG EC tablet Take 1 tablet (81 mg total) by mouth daily.  30 tablet  5  . lisinopril (PRINIVIL,ZESTRIL) 5 MG tablet Take 1 tablet (5 mg total) by mouth daily.  30 tablet  11  . pravastatin (PRAVACHOL) 20 MG tablet Take 1 tablet (20 mg total) by mouth daily.  30 tablet  5   No current facility-administered medications for this visit.   No family history on  file. History   Social History  . Marital Status: Single    Spouse Name: N/A    Number of Children: N/A  . Years of Education: N/A   Social History Main Topics  . Smoking status: Former Games developer  . Smokeless tobacco: Not on file  . Alcohol Use: No     Comment: quite 2011  . Drug Use: No     Comment: h/o cocaine abuse  . Sexually Active: Not on file   Other Topics Concern  . Not on file   Social History Narrative  . No narrative on file   Review of Systems: Constitutional: Denies fever, chills, diaphoresis, appetite change and fatigue.  HEENT: Denies photophobia, eye pain, redness, hearing loss, ear pain, congestion, sore throat, rhinorrhea, sneezing, mouth sores, trouble swallowing, neck pain, neck stiffness and tinnitus.   Respiratory: Denies SOB, DOE, cough, chest tightness,  and wheezing.   Cardiovascular: Denies chest pain, palpitations and leg swelling.  Gastrointestinal: Gas and bloating.  Denies nausea, vomiting, abdominal pain, diarrhea, constipation, blood in stool.  Genitourinary: Denies dysuria, urgency, frequency, hematuria, flank pain and difficulty urinating.  Musculoskeletal: Denies myalgias, back pain, joint swelling, arthralgias and gait problem.  Skin: Denies pallor, rash and wound.  Neurological: Hx of CVA. Denies dizziness, seizures, syncope, weakness,  light-headedness, numbness and headaches.  Hematological: Denies adenopathy. Easy bruising, personal or family bleeding history  Psychiatric/Behavioral: Denies suicidal ideation, mood changes, confusion, nervousness, sleep disturbance and agitation  Objective:  Physical Exam: Filed Vitals:   09/19/12 0859 09/19/12 0931  BP: 170/101 160/106  Pulse: 65 62  Temp: 97 F (36.1 C)   TempSrc: Oral   Height: 5' 7.5" (1.715 m)   Weight: 147 lb 14.4 oz (67.087 kg)   SpO2: 97%    Constitutional: Vital signs reviewed.  Patient is a well-developed and well-nourished male in no acute distress and cooperative with  exam. Alert and oriented x3.  Head: Normocephalic and atraumatic Ear: TM normal bilaterally Mouth: no erythema or exudates, MMM Eyes: PERRL, EOMI, conjunctivae normal, No scleral icterus.  Neck: Supple, Trachea midline normal  Cardiovascular: RRR, S1 normal, S2 normal, no MRG, pulses symmetric and intact bilaterally Pulmonary/Chest: CTAB, no wheezes, rales, or rhonchi Abdominal: Soft. Non-tender, non-distended, bowel sounds are normal, no masses, organomegaly, or guarding present.  GU: no CVA tenderness Musculoskeletal: No joint deformities, erythema, or stiffness, ROM full and no nontender Hematology: no cervical, inginal, or axillary adenopathy.  Neurological: A&O x3, Strength is normal and symmetric bilaterally, cranial nerve II-XII are grossly intact, no focal motor deficit, sensory intact to light touch bilaterally.  Skin: Warm, dry and intact. No rash, cyanosis, or clubbing.  Psychiatric: Normal mood and affect. speech and behavior is normal. Judgment and thought content normal. Cognition and memory are normal.   Assessment & Plan:  Discussed with Dr. Josem Kaufmann  HTN: stop lisinopril due to cough, start HCTZ 25mg  po qd, recheck 2 weeks, may need 2nd agent.    Gas: ?lactose intolerance.  Claims has always had stomach upset with milk and does not drink it at all anymore.

## 2012-09-19 NOTE — Assessment & Plan Note (Signed)
~  2 weeks of gas after eating and occasional abdominal discomfort and distention.  Claims he eats high fatty food diet and had hamburger last night with cheese.  Reports burping and flatulence.  No diarrhea and occasional constipation.  Reports diarrhea in the past with drinking milk, similar to what his father had as well.  Possible lactose intolerance.  Not much relief with otc gas x  -instructed to decrease fatty food intake and avoid dairy for next 2 weeks as much as possible to see if that helps.   -can try otc agents, gas x or beano

## 2012-09-19 NOTE — Assessment & Plan Note (Addendum)
Was started on lisinopril 5mg  qd 07/2012 and relays having a dry cough since starting.    -DISCONTINUED Lisinopril -STARTED HCTZ 25mg  QD

## 2012-09-19 NOTE — Assessment & Plan Note (Signed)
BP Readings from Last 3 Encounters:  09/19/12 160/106  08/09/12 136/92  08/02/12 147/82    Lab Results  Component Value Date   NA 141 08/01/2012   K 3.9 08/01/2012   CREATININE 0.90 08/01/2012    Assessment:  Blood pressure control: severely elevated  Progress toward BP goal:  deteriorated  Comments: initial BP was 170/101 and repeat down to 160/106.  Did not take lisinopril this morning  Plan:  Medications:  DISCONTINUE Lisinopril due to complaints of cough and START HCTZ 25mg  QD TODAY  Educational resources provided: video  Self management tools provided: home blood pressure logbook;instructions for home blood pressure monitoring  Other plans: was complaining of dry cough since starting ACEi, reports success with HCTZ in the past at [facility].  Will switch to HCTZ today, will need to take first dose starting today, prescription sent to [facility].  Follow up in 2 weeks for repeat BP check, if still elevated, will need to start second agent.

## 2012-09-19 NOTE — Assessment & Plan Note (Signed)
Slowly improving per patient.    -continue to monitor

## 2012-09-19 NOTE — Patient Instructions (Addendum)
Please STOP taking your Lisinopril  Please START taking Hydrochlorothiazide 25mg  EVERY DAY and return to clinic in 2 weeks for repeat blood pressure check, we may need to adjust your medication again.  Please CONTINUE Aspirin and Pravastatin DAILY  Please try to check your blood pressure at home or at your nearest drug store and bring in the values.  The health department may be able to help you purchase a monitor  Try to decrease eating fatty foods and heavy meals before bed.  You can try over the counter agents for gas like gas x or beano.  If your symptoms worsen or continue, call the clinic [phone]  You have refused a tetanus vaccine today but we will need to obtain prior records  Thank you and see you soon!

## 2012-09-20 ENCOUNTER — Encounter: Payer: Self-pay | Admitting: Internal Medicine

## 2012-10-03 ENCOUNTER — Encounter: Payer: Self-pay | Admitting: Internal Medicine

## 2012-10-03 ENCOUNTER — Ambulatory Visit (INDEPENDENT_AMBULATORY_CARE_PROVIDER_SITE_OTHER): Payer: No Typology Code available for payment source | Admitting: Internal Medicine

## 2012-10-03 VITALS — BP 145/92 | HR 59 | Temp 97.2°F | Ht 67.5 in | Wt 147.2 lb

## 2012-10-03 DIAGNOSIS — R141 Gas pain: Secondary | ICD-10-CM

## 2012-10-03 DIAGNOSIS — R140 Abdominal distension (gaseous): Secondary | ICD-10-CM

## 2012-10-03 DIAGNOSIS — I10 Essential (primary) hypertension: Secondary | ICD-10-CM

## 2012-10-03 LAB — COMPLETE METABOLIC PANEL WITH GFR
Albumin: 4.1 g/dL (ref 3.5–5.2)
BUN: 12 mg/dL (ref 6–23)
CO2: 32 mEq/L (ref 19–32)
Calcium: 9.5 mg/dL (ref 8.4–10.5)
Chloride: 100 mEq/L (ref 96–112)
GFR, Est Non African American: >89 mL/min
Glucose, Bld: 106 mg/dL — ABNORMAL HIGH (ref 70–99)
Potassium: 4.1 mEq/L (ref 3.5–5.3)
Total Protein: 6.4 g/dL (ref 6.0–8.3)

## 2012-10-03 NOTE — Progress Notes (Signed)
Subjective:   Patient ID: [name] male   DOB: 07-15-1956 57 y.o.   MRN: [id number]  HPI: ChristopherJuniel A [name] is a 57 y.o. pleasant African American male with PMH of HTN and CVA 07/2012 presenting to clinic today for acute 2 week follow up visit for blood pressure.  He was started on HCTZ 25mg  po qd by me last visit for uncontrolled hypertension and today his BP is much improved.  On recheck, his BP was down to 145/92.   Otherwise, he reports to be doing well.  She still has unchanged complaints of occasional foul-smelling passing gas, but no belching, diarrhea, or blood in stool reported.  He has tried to decrease fatty food but still reports eating cheese.  He has tried otc gas-x to minimal relief.  i have recommended again to try lactose free diet and see if symptoms improve as he reports in the past GI upset with milk and ice cream.  His colonoscopy has been scheduled for April 2014.      Past Medical History  Diagnosis Date  . Hypertension   . Stroke     posterior limb, R internal capsule stroke 07/2012  . ETOH abuse     quit  . Cocaine abuse     quit   Current Outpatient Prescriptions  Medication Sig Dispense Refill  . aspirin EC 81 MG EC tablet Take 1 tablet (81 mg total) by mouth daily.  30 tablet  5  . hydrochlorothiazide (HYDRODIURIL) 25 MG tablet Take 1 tablet (25 mg total) by mouth daily.  30 tablet  1  . pravastatin (PRAVACHOL) 20 MG tablet Take 1 tablet (20 mg total) by mouth daily.  30 tablet  5   No current facility-administered medications for this visit.   No family history on file. History   Social History  . Marital Status: Single    Spouse Name: N/A    Number of Children: N/A  . Years of Education: N/A   Social History Main Topics  . Smoking status: Former Games developer  . Smokeless tobacco: Not on file  . Alcohol Use: No     Comment: quite 2011  . Drug Use: No     Comment: h/o cocaine abuse  . Sexually Active: Not on file   Other Topics Concern  . Not  on file   Social History Narrative  . No narrative on file   Review of Systems: Constitutional: Denies fever, chills, diaphoresis, appetite change and fatigue.  HEENT: Denies photophobia, eye pain, redness, hearing loss, ear pain, congestion, sore throat, rhinorrhea, sneezing, mouth sores, trouble swallowing, neck pain, neck stiffness and tinnitus.   Respiratory: Denies SOB, DOE, cough, chest tightness,  and wheezing.   Cardiovascular: Denies chest pain, palpitations and leg swelling.  Gastrointestinal: bloating and gas.  Denies nausea, vomiting, abdominal pain, diarrhea, constipation, blood in stool.    Genitourinary: Denies dysuria, urgency, frequency, hematuria, flank pain and difficulty urinating.  Musculoskeletal: Denies myalgias, back pain, joint swelling, arthralgias and gait problem.  Skin: Denies pallor, rash and wound.  Neurological: Denies dizziness, seizures, syncope, weakness, light-headedness, numbness and headaches.  Hematological: Denies adenopathy. Easy bruising, personal or family bleeding history  Psychiatric/Behavioral: Denies suicidal ideation, mood changes, confusion, nervousness, sleep disturbance and agitation  Objective:  Physical Exam: Filed Vitals:   10/03/12 0943 10/03/12 1008  BP: 150/96 145/92  Pulse: 68 59  Temp: 97.2 F (36.2 C)   TempSrc: Oral   Height: 5' 7.5" (1.715 m)   Weight: 147  lb 3.2 oz (66.769 kg)   SpO2: 100%    Constitutional: Vital signs reviewed.  Patient is a well-developed and well-nourished male in no acute distress and cooperative with exam. Alert and oriented x3.  Head: Normocephalic and atraumatic Ear: TM normal bilaterally Mouth: no erythema or exudates, MMM Eyes: PERRL, EOMI, conjunctivae normal, No scleral icterus.  Neck: Supple, Trachea midline normal ROM Cardiovascular: RRR, S1 normal, S2 normal, no MRG, pulses symmetric and intact bilaterally Pulmonary/Chest: CTAB, no wheezes, rales, or rhonchi Abdominal: Soft.  Non-tender, non-distended, bowel sounds are normal or guarding present.  GU: no CVA tenderness Musculoskeletal: No joint deformities, erythema, or stiffness, ROM full and no nontender Hematology: no cervical, inginal, or axillary adenopathy.  Neurological: A&O x3, Strength is normal and symmetric bilaterally, cranial nerve II-XII are grossly intact, no focal motor deficit, sensory intact to light touch bilaterally.  Skin: Warm, dry and intact. No rash, cyanosis, or clubbing.  Psychiatric: Normal mood and affect. speech and behavior is normal. Judgment and thought content normal. Cognition and memory are normal.   Assessment & Plan:  Discussed with Dr. Eben Burow HTN: much improved control: continue HCTZ 25mg  daily and follow up cmet today

## 2012-10-03 NOTE — Assessment & Plan Note (Signed)
BP Readings from Last 3 Encounters:  10/03/12 145/92  09/19/12 160/106  08/09/12 136/92   Lab Results  Component Value Date   NA 141 08/01/2012   K 3.9 08/01/2012   CREATININE 0.90 08/01/2012   Assessment:  Blood pressure control: controlled  Progress toward BP goal:  improved  Comments: recently started on HCTZ 25mg  qd two weeks ago, BP on recheck more controlled.  Will continue current dose at this time.   Plan:  Medications:  continue current medications hctz 25mg  po qd  Educational resources provided:    Self management tools provided:    Other plans: continue to monitor BP at home, if elevated >150/90-100 asked to call clinic.  Educated on diet modifications, low sodium.   -f/u cmet today

## 2012-10-03 NOTE — Patient Instructions (Addendum)
Continue your BP medication, we will not change your dose--continue HCTZ 25mg  daily  Try to check your blood pressure at home and continue to monitor, if elevated >150/90  Consistently, call clinic and we may need to adjust [phone]  Continue to try gas-x as needed for gas  Try lactose free diet  Follow up with your colonoscopy  Bloating Bloating is the feeling of fullness in your belly. You may feel as though your pants are too tight. Often the cause of bloating is overeating, retaining fluids, or having gas in your bowel. It is also caused by swallowing air and eating foods that cause gas. Irritable bowel syndrome is one of the most common causes of bloating. Constipation is also a common cause. Sometimes more serious problems can cause bloating. SYMPTOMS  Usually there is a feeling of fullness, as though your abdomen is bulged out. There may be mild discomfort.  DIAGNOSIS  Usually no particular testing is necessary for most bloating. If the condition persists and seems to become worse, your caregiver may do additional testing.  TREATMENT   There is no direct treatment for bloating.  Do not put gas into the bowel. Avoid chewing gum and sucking on candy. These tend to make you swallow air. Swallowing air can also be a nervous habit. Try to avoid this.  Avoiding high residue diets will help. Eat foods with soluble fibers (examples include root vegetables, apples, or barley) and substitute dairy products with soy and rice products. This helps irritable bowel syndrome.  If constipation is the cause, then a high residue diet with more fiber will help.  Avoid carbonated beverages.  Over-the-counter preparations are available that help reduce gas. Your pharmacist can help you with this. SEEK MEDICAL CARE IF:   Bloating continues and seems to be getting worse.  You notice a weight gain.  You have a weight loss but the bloating is getting worse.  You have changes in your bowel habits  or develop nausea or vomiting. SEEK IMMEDIATE MEDICAL CARE IF:   You develop shortness of breath or swelling in your legs.  You have an increase in abdominal pain or develop chest pain. Document Released: 05/04/2006 Document Revised: 09/26/2011 Document Reviewed: 06/22/2007 [facility] Patient Information 2013 [place], [place].  Lactose-Free Diet Lactose is a carbohydrate that is found mainly in milk and milk products, as well as in foods with added milk or whey. Lactose must be digested by the enzyme lactase in order to be used by the body. Lactose intolerance occurs when there is a shortage of lactase. When your body is not able to digest lactose, you may feel sick to your stomach (nausea), bloated, and have cramps, gas, and diarrhea. TYPES OF LACTASE DEFICIENCY  Primary lactase deficiency. This is the most common type. It is characterized by a slow decrease in lactase activity.  Secondary lactase deficiency. This occurs following injury to the small intestinal mucosa as a result of a disease or condition. It can also occur as a result of surgery or after treatment with antibiotic medicines or cancer drugs. Tolerance to lactose varies widely. Each person must determine how much milk can be consumed without developing symptoms. Drinking smaller portions of milk throughout the day may be helpful. Some studies suggest that slowing gastric emptying may help increase tolerance of milk products. This may be done by:  Consuming milk or milk products with a meal rather than alone.  Consuming milk with a higher fat content. There are many dairy products that may  be tolerated better than milk by some people, including:  Cheese (especially aged cheese). The lactose content is much lower than in milk.  Cultured dairy products, such as yogurt, buttermilk, cottage cheese, and sweet acidophilus milk (kefir). These products are usually well tolerated by lactase-deficient people. This is because the healthy  bacteria help digest lactose.  Lactose-hydrolyzed milk. This product contains 40% to 90% less lactose than milk and may also be well tolerated. ADEQUACY These diets may be deficient in calcium, riboflavin, and vitamin D, according to the Recommended Dietary Allowances of the [employer]. Depending on individual tolerances and the use of milk substitutes, milk, or other dairy products, you may be able to meet these recommendations. SPECIAL NOTES  Lactose is a carbohydrate. The main food source for lactose is dairy products. Reading food labels is important. Many products contain lactose even when they are not made from milk. Look for the following words: whey, milk solids, dry milk solids, nonfat dry milk powder. Typical sources of lactose other than dairy products include breads, candies, cold cuts, prepared and processed foods, and commercial sauces and gravies.  All foods must be prepared without milk, cream, or other dairy foods.  A vitamin or mineral supplement may be necessary. Consult your caregiver or Registered Dietitian.  Lactose is also found in many prescription and over-the-counter medicines.  Soy milk and lactose-free supplements may be used as an alternative to milk. CHOOSING FOODS Breads and Starches  Allowed: Breads and rolls made without milk. [place], [place], or [place] bread. Soda crackers, graham crackers. Any crackers prepared without lactose. Cooked or dry cereals prepared without lactose (read labels). Any potatoes, pasta, or rice prepared without milk or lactose. Popcorn.  Avoid: Breads and rolls that contain milk. Prepared mixes such as muffins, biscuits, waffles, pancakes. Sweet rolls, donuts, [place] toast (if made with milk or lactose). Zwieback crackers, corn curls, or any crackers that contain lactose. Cooked or dry cereals prepared with lactose (read labels). Instant potatoes, frozen [place] fries, scalloped or au gratin potatoes. Vegetables  Allowed:  Fresh, frozen, and canned vegetables.  Avoid: Creamed or breaded vegetables. Vegetables in a cheese sauce or with lactose-containing margarines. Fruit  Allowed: All fresh, canned, or frozen fruits that are not processed with lactose.  Avoid: Any canned or frozen fruits processed with lactose. Meat and Meat Substitutes  Allowed: Plain beef, chicken, fish, [place], lamb, veal, pork, or ham. Kosher prepared meat products. Strained or junior meats that do not contain milk. Eggs, soy meat substitutes, nuts.  Avoid: Scrambled eggs, omelets, and souffles that contain milk. Creamed or breaded meat, fish, or fowl. Sausage products such as wieners, liver sausage, or cold cuts that contain milk solids. Cheese, cottage cheese, or cheese spreads. Milk  Allowed: None.  Avoid: Milk (whole, 2%, skim, or chocolate). Evaporated, powdered, or condensed milk. Malted milk. Soups and Combination Foods  Allowed: Bouillon, broth, vegetable soups, clear soups, consomms. Homemade soups made with allowed ingredients. Combination or prepared foods that do not contain milk or milk products (read labels).  Avoid: Cream soups, chowders, commercially prepared soups containing lactose. Macaroni and cheese, pizza. Combination or prepared foods that contain milk or milk products. Desserts and Sweets  Allowed: Water and fruit ices, gelatin, angel food cake. Homemade cookies, pies, or cakes made from allowed ingredients. Pudding (if made with water or a milk substitute). Lactose-free tofu desserts. Sugar, honey, corn syrup, jam, jelly, marmalade, molasses (beet sugar). Pure sugar candy, marshmallows.  Avoid: Ice cream, ice milk, sherbet, custard,  pudding, frozen yogurt. Commercial cake and cookie mixes. Desserts that contain chocolate. Pie crust made with milk-containing margarine. Reduced calorie desserts made with a sugar substitute that contains lactose. Toffee, peppermint, butterscotch, chocolate, caramels. Fats and  Oils  Allowed: Butter (as tolerated, contains very small amounts of lactose). Margarines and dressings that do not contain milk. Vegetable oils, shortening, mayonnaise, nondairy cream and whipped toppings without lactose or milk solids added. Tomasa Blase.  Avoid: Margarines and salad dressings containing milk. Cream, cream cheese, peanut butter with added milk solids, sour cream, chip dips made with sour cream. Beverages  Allowed: Carbonated drinks, tea, coffee and freeze-dried coffee, some instant coffees (check labels). Fruit drinks, fruit and vegetable juice, rice or soy milk.  Avoid: Hot chocolate. Some cocoas, some instant coffees, instant iced teas, powdered fruit drinks (read labels). Condiments  Allowed: Soy sauce, carob powder, olives, gravy made with water, baker's cocoa, pickles, pure seasonings and spices, wine, pure monosodium glutamate, catsup, mustard.  Avoid: Some chewing gums, chocolate, some cocoas. Certain antibiotics and vitamin or mineral preparations. Spice blends if they contain milk products. MSG extender. Artificial sweeteners that contain lactose. Some nondairy creamers (read labels). SAMPLE MENU Breakfast  Orange juice.  Banana.  Bran cereal.  Nondairy creamer.  Vienna bread, toasted.  Butter or milk-free margarine.  Coffee or tea. Lunch  Chicken breast.  Rice.  Green beans.  Butter or milk-free margarine.  Fresh melon.  Coffee or tea. Dinner  [place].  Baked potato.  Butter or milk-free margarine.  Broccoli.  Lettuce salad with vinegar and oil dressing.  [employer].  Coffee or tea. Document Released: 12/24/2001 Document Revised: 09/26/2011 Document Reviewed: 10/01/2010 [facility] Patient Information 2013 [place], [place].

## 2012-10-03 NOTE — Assessment & Plan Note (Signed)
Still complaining of foul-smelling passing gas.  No change from prior.  Regular BMs, no diarrhea, no belching, heart burn.  Abdomen non-tender on physical exam. +bs.  -gas x prn -scheduled for colonoscopy in April 2014 -try lactose free diet -avoid fatty foods as much as possible -no improvement or worsening with abdominal pain, asked to call clinic back [phone]

## 2012-10-22 ENCOUNTER — Ambulatory Visit (INDEPENDENT_AMBULATORY_CARE_PROVIDER_SITE_OTHER): Payer: No Typology Code available for payment source | Admitting: Internal Medicine

## 2012-10-22 ENCOUNTER — Encounter: Payer: Self-pay | Admitting: Internal Medicine

## 2012-10-22 ENCOUNTER — Ambulatory Visit (HOSPITAL_COMMUNITY)
Admission: RE | Admit: 2012-10-22 | Discharge: 2012-10-22 | Disposition: A | Discharge status: Home / Self Care | Payer: No Typology Code available for payment source | Source: Ambulatory Visit | Attending: Internal Medicine | Admitting: Internal Medicine

## 2012-10-22 VITALS — BP 139/97 | HR 72 | Temp 97.1°F | Ht 66.0 in | Wt 148.0 lb

## 2012-10-22 DIAGNOSIS — S92919A Unspecified fracture of unspecified toe(s), initial encounter for closed fracture: Secondary | ICD-10-CM | POA: Insufficient documentation

## 2012-10-22 DIAGNOSIS — I10 Essential (primary) hypertension: Secondary | ICD-10-CM

## 2012-10-22 DIAGNOSIS — M79674 Pain in right toe(s): Secondary | ICD-10-CM

## 2012-10-22 DIAGNOSIS — S92911A Unspecified fracture of right toe(s), initial encounter for closed fracture: Secondary | ICD-10-CM | POA: Insufficient documentation

## 2012-10-22 DIAGNOSIS — M79609 Pain in unspecified limb: Secondary | ICD-10-CM

## 2012-10-22 DIAGNOSIS — W208XXA Other cause of strike by thrown, projected or falling object, initial encounter: Secondary | ICD-10-CM | POA: Insufficient documentation

## 2012-10-22 NOTE — Progress Notes (Signed)
I discussed the patient with resident Dr. Kalia-Reynolds at the time of the visit, and I reviewed the history, findings, diagnosis, and treatment plan as outlined in the resident's note. I agree with the plans as outlined in her note.  

## 2012-10-22 NOTE — Patient Instructions (Addendum)
General Instructions:  Please follow-up at the clinic in 2 weeks, at which time we will reevaluate your blood pressure, toe pain - OR, please follow-up in the clinic sooner if needed.  There have been changes in your medications:  START using tylenol as needed for your toe pain.    If you have been started on new medication(s), and you develop symptoms concerning for allergic reaction, including, but not limited to, throat closing, tongue swelling, rash, please stop the medication immediately and call the clinic at [phone], and go to the ER. See the tips below for decreasing your blood pressure.  If symptoms worsen, or new symptoms arise, please call the clinic or go to the ER.  PLEASE BRING ALL OF YOUR MEDICATIONS  IN A BAG TO YOUR NEXT APPOINTMENT   Treatment Goals:  Goals (1 Years of Data) as of 10/22/12         As of Today 10/03/12 10/03/12 09/19/12 09/19/12     Blood Pressure    . Blood Pressure < 140/90  139/97 145/92 150/96 160/106 170/101      Progress Toward Treatment Goals:  Treatment Goal 10/03/2012  Blood pressure improved    Self Care Goals & Plans:  Self Care Goal 10/22/2012  Manage my medications take my medicines as prescribed  Monitor my health keep track of my blood pressure  Eat healthy foods eat foods that are low in salt  Be physically active take a walk every day       Care Management & Community Referrals:  Referral 10/03/2012  Referrals made for care management support none needed     LIFESTYLE TIPS TO HELP WITH YOUR BLOOD PRESSURE CONTROL  WEIGHT REDUCTION:  Strategies: A healthy weight loss program includes:  A calorie restricted diet based on individual calorie needs.   Increased physical activity (exercise).  An exercise program is just as important as the right low-calorie diet.    An unhealthy weight loss program includes:  Fasting.   Fad diets.   Supplements and drugs.  These choices do not succeed in long-term weight control.    Home Care Instructions: To help you make the needed dietary changes:   Exercise and perform physical activity as directed by your caregiver.   Keep a daily record of everything you eat. There are many free websites to help you with this. It may be helpful to measure your foods so you can determine if you are eating the correct portion sizes.   Use low-calorie cookbooks or take special cooking classes.   Avoid alcohol. Drink more water and drinks with no calories.   Take vitamins and supplements only as recommended by your caregiver.   Weight loss support groups, Registered Dieticians, counselors, and stress reduction education can also be very helpful.   ________________________________________________________________________  DASH DIET:  The DASH diet stands for "Dietary Approaches to Stop Hypertension." It is a healthy eating plan that has been shown to reduce high blood pressure (hypertension) in as little as 14 days, while also possibly providing other significant health benefits. These other health benefits include reducing the risk of breast cancer after menopause and reducing the risk of type 2 diabetes, heart disease, colon cancer, and stroke. Health benefits also include weight loss and slowing kidney failure in patients with chronic kidney disease.   Diet guidelines: Limit salt (sodium). Your diet should contain less than 1500 mg of sodium daily.  Limit refined or processed carbohydrates. Your diet should include mostly whole grains. Desserts and  added sugars should be used sparingly.  Include small amounts of heart-healthy fats. These types of fats include nuts, oils, and tub margarine. Limit saturated and trans fats. These fats have been shown to be harmful in the body.   Choosing Foods: The following food groups are based on a 2000 calorie diet. See your Registered Dietitian for individual calorie needs.  Grains and Grain Products (6 to 8 servings daily)  Eat More Often:  Whole-wheat bread, brown rice, whole-grain or wheat pasta, quinoa, popcorn without added fat or salt (air popped).  Eat Less Often: White bread, white pasta, white rice, cornbread.  Vegetables (4 to 5 servings daily)  Eat More Often: Fresh, frozen, and canned vegetables. Vegetables may be raw, steamed, roasted, or grilled with a minimal amount of fat.  Eat Less Often/Avoid: Creamed or fried vegetables. Vegetables in a cheese sauce.  Fruit (4 to 5 servings daily)  Eat More Often: All fresh, canned (in natural juice), or frozen fruits. Dried fruits without added sugar. One hundred percent fruit juice ( cup [237 mL] daily).  Eat Less Often: Dried fruits with added sugar. Canned fruit in light or heavy syrup.  [employer], Fish, and Poultry (2 servings or less daily. One serving is 3 to 4 oz [85-114 g]).  Eat More Often: Ninety percent or leaner ground beef, tenderloin, sirloin. Round cuts of beef, chicken breast, [place] breast. All fish. Grill, bake, or broil your meat. Nothing should be fried.  Eat Less Often/Avoid: Fatty cuts of meat, [place], or chicken leg, thigh, or wing. Fried cuts of meat or fish.  Dairy (2 to 3 servings)  Eat More Often: Low-fat or fat-free milk, low-fat plain or light yogurt, reduced-fat or part-skim cheese.  Eat Less Often/Avoid: Milk (whole, 2%, skim, or chocolate). Whole milk yogurt. Full-fat cheeses.  Nuts, Seeds, and Legumes (4 to 5 servings per week)  Eat More Often: All without added salt.  Eat Less Often/Avoid: Salted nuts and seeds, canned beans with added salt.  Fats and Sweets (limited)  Eat More Often: Vegetable oils, tub margarines without trans fats, sugar-free gelatin. Mayonnaise and salad dressings.  Eat Less Often/Avoid: Coconut oils, palm oils, butter, stick margarine, cream, half and half, cookies, candy, pie.   Toe Fracture Your caregiver has diagnosed you as having a fractured toe. A toe fracture is a break in the bone of a toe. "Buddy taping" is a way  of splinting your broken toe, by taping the broken toe to the toe next to it. This "buddy taping" will keep the injured toe from moving beyond normal range of motion. Buddy taping also helps the toe heal in a more normal alignment. It may take 6 to 8 weeks for the toe injury to heal. HOME CARE INSTRUCTIONS   Leave your toes taped together for as long as directed by your caregiver or until you see a doctor for a follow-up examination. You can change the tape after bathing. Always use a small piece of gauze or cotton between the toes when taping them together. This will help the skin stay dry and prevent infection.  Apply ice to the injury for 15 to 20 minutes each hour while awake for the first 2 days. Put the ice in a plastic bag and place a towel between the bag of ice and your skin.  After the first 2 days, apply heat to the injured area. Use heat for the next 2 to 3 days. Place a heating pad on the foot or soak  the foot in warm water as directed by your caregiver.  Keep your foot elevated as much as possible to lessen swelling.  Wear sturdy, supportive shoes. The shoes should not pinch the toes or fit tightly against the toes.  Your caregiver may prescribe a rigid shoe if your foot is very swollen.  Your may be given crutches if the pain is too great and it hurts too much to walk.  Only take over-the-counter or prescription medicines for pain, discomfort, or fever as directed by your caregiver.  If your caregiver has given you a follow-up appointment, it is very important to keep that appointment. Not keeping the appointment could result in a chronic or permanent injury, pain, and disability. If there is any problem keeping the appointment, you must call back to this facility for assistance. SEEK MEDICAL CARE IF:   You have increased pain or swelling, not relieved with medications.  The pain does not get better after 1 week.  Your injured toe is cold when the others are warm. SEEK  IMMEDIATE MEDICAL CARE IF:   The toe becomes cold, numb, or white.  The toe becomes hot (inflamed) and red. Document Released: 07/01/2000 Document Revised: 09/26/2011 Document Reviewed: 02/18/2008 [facility] Patient Information 2013 [place], [place].

## 2012-10-22 NOTE — Assessment & Plan Note (Signed)
Pertinent Data: BP Readings from Last 3 Encounters:  10/22/12 139/97  10/03/12 145/92  09/19/12 160/106    Basic Metabolic Panel:    Component Value Date/Time   NA 140 10/03/2012 1048   K 4.1 10/03/2012 1048   CL 100 10/03/2012 1048   CO2 32 10/03/2012 1048   BUN 12 10/03/2012 1048   CREATININE 0.77 10/03/2012 1048   CREATININE 0.90 08/01/2012 0727   GLUCOSE 106* 10/03/2012 1048   CALCIUM 9.5 10/03/2012 1048    Assessment: Disease Control:  Uncontrolled  Progress toward goals:  Improved  Barriers to meeting goals: acutely in pain, therefore, likely contributing towards isolated elevation.    Patient is compliant most of the time with prescribed medications.   Plan:  continue current medications  Recheck next visit, with consideration to add additional agent such as ACE-I if persistent elevation.  Educational resources provided: brochure  Self management tools provided: home blood pressure logbook

## 2012-10-22 NOTE — Assessment & Plan Note (Addendum)
Assessment: Mechanical injury to right 4th toe 1 week ago after tree limb fell on his toe. Has had significant pain and mild swelling particularly over the MTP since that time. On exam has mild bruising however full range of motion, full muscle strength, and strong pulses bilaterally. Given mechanism of injury, must evaluate for fracture.  Plan:      X-ray foot focused on right fourth great toe.   If fracture apparent, will consider buddy taping versus orthopedics referral as indicated.   When necessary Tylenol for pain.  ADDENDUM TO PLAN AFTER LABS RESULTED: 10/23/2012, 1:32 PM  Pertinent Results: Right Foot XR (10/22/2012) - Oblique, minimally displaced fracture of the proximal  phalanx of the fourth digit. No intra-articular extension.   Assessment: Imaging does show a proximal phalanx fracture involving the fourth digit. Consistent with his physical exam findings. I spoke with orthopedic physician on call who recommended buddy taping for now, flat-bottom shoe, as well minimal pressure on the involved area. He also recommended for orthopedic followup in one to 2 weeks. Unfortunately, we will not be able to send the patient to that attending's practice because he only has [facility], however, will attempt to refer through [facility].  Plan:  Will bring in for nurse visit tomorrow to buddy tape the two toes together.  We will also provide a wooden shoe at that time.  Refer to orthopedics in 1-2 weeks.  Will have close clinic followup.

## 2012-10-22 NOTE — Progress Notes (Signed)
   Patient: [name]   MRN: [id number]  DOB: 1956-07-05  PCP: Christopher Palmer, MD   Subjective:    HPI: [name] is a 57 y.o. male with a PMHx as outlined below, who presented to clinic today for the following:  1) Right 4th toe pain/injury - Has been having pain x 1 week, constant in nature - worse with ambulation. He is unclear, but thinks that a tree limb may have fallen on it. Immediate symptoms: pain, but no obvious physical deformity. The next day, pain was more severe and swollen. Symptoms have gotten progressively worse, therefore, he presented to clinic today for evaluation. No fevers, chills, open sores, no bruising or skin breakdown, normal ROM of toe. Prior history of related problems: no prior problems with this area in the past.   Review of Systems: Per HPI.   Current Outpatient Medications: Medication Sig  . aspirin EC 81 MG EC tablet Take 1 tablet (81 mg total) by mouth daily.  . hydrochlorothiazide (HYDRODIURIL) 25 MG tablet Take 1 tablet (25 mg total) by mouth daily.  . pravastatin (PRAVACHOL) 20 MG tablet Take 1 tablet (20 mg total) by mouth daily.    Allergies  Allergen Reactions  . Penicillins Other (See Comments)    unknown    Past Medical History  Diagnosis Date  . Hypertension   . Stroke     posterior limb, R internal capsule stroke 07/2012  . ETOH abuse     quit  . Cocaine abuse     quit    Past Surgical History  Procedure Laterality Date  . Hand surgery       Objective:    Physical Exam: Filed Vitals:   10/22/12 1431  BP: 139/97  Pulse: 72  Temp: 97.1 F (36.2 C)     General: Vital signs reviewed and noted. Well-developed, well-nourished, in no acute distress; alert, appropriate and cooperative throughout examination.  Head: Normocephalic, atraumatic.  Lungs:  Normal respiratory effort. Clear to auscultation BL without crackles or wheezes.  Heart: RRR. S1 and S2 normal without gallop, rubs. No murmur.  Abdomen:  BS  normoactive. Soft, Nondistended, non-tender.  No masses or organomegaly.  Extremities: No pretibial edema. Right foot - right fourth toe with tenderness to palpation over MTP joint. No significant swelling over that joint. Mild swelling of the phalange. No open lacerations or abrasions. DP pulses strong and equal bilaterally.    Assessment/ Plan:   The patient's case and plan of care was discussed with attending physician, Dr. Margarito Liner.

## 2012-10-23 ENCOUNTER — Telehealth: Payer: Self-pay | Admitting: *Deleted

## 2012-10-23 NOTE — Addendum Note (Signed)
Addended by: Priscella Mann on: 10/23/2012 01:59 PM   Modules accepted: Orders

## 2012-10-23 NOTE — Progress Notes (Signed)
Quick Note:  He does have a fracture. Therefore, will use buddy taping and flat bottom shoe for now. Will arrange for ortho followup in 1-2 weeks. ______

## 2012-10-23 NOTE — Telephone Encounter (Signed)
Please call pt at [phone], he is anxious to know results of xrays

## 2012-10-23 NOTE — Telephone Encounter (Signed)
I called him already.  Thank you! - Johnette Abraham, D.O., 10/23/2012, 4:32 PM

## 2012-10-24 ENCOUNTER — Ambulatory Visit: Payer: No Typology Code available for payment source | Admitting: Internal Medicine

## 2012-10-25 ENCOUNTER — Ambulatory Visit (AMBULATORY_SURGERY_CENTER): Payer: Self-pay | Admitting: *Deleted

## 2012-10-25 VITALS — Ht 67.0 in | Wt 144.4 lb

## 2012-10-25 DIAGNOSIS — Z1211 Encounter for screening for malignant neoplasm of colon: Secondary | ICD-10-CM

## 2012-10-25 MED ORDER — PEG-KCL-NACL-NASULF-NA ASC-C 100 G PO SOLR: ORAL | Status: DC

## 2012-10-25 NOTE — Progress Notes (Signed)
No egg or soy allergy 

## 2012-11-06 ENCOUNTER — Encounter: Payer: Self-pay | Admitting: Internal Medicine

## 2012-11-06 ENCOUNTER — Ambulatory Visit (INDEPENDENT_AMBULATORY_CARE_PROVIDER_SITE_OTHER): Payer: No Typology Code available for payment source | Admitting: Internal Medicine

## 2012-11-06 VITALS — BP 139/92 | HR 71 | Temp 97.7°F | Ht 66.0 in | Wt 145.5 lb

## 2012-11-06 DIAGNOSIS — S92911D Unspecified fracture of right toe(s), subsequent encounter for fracture with routine healing: Secondary | ICD-10-CM

## 2012-11-06 DIAGNOSIS — I10 Essential (primary) hypertension: Secondary | ICD-10-CM

## 2012-11-06 DIAGNOSIS — S8290XD Unspecified fracture of unspecified lower leg, subsequent encounter for closed fracture with routine healing: Secondary | ICD-10-CM

## 2012-11-06 NOTE — Patient Instructions (Addendum)
General Instructions: Please try to rest your foot as much as possible, apply ice to the area as needed if swelling and take tylenol for pain if needed.  DO NOT TAKE MORE THAN 4Grams of tylenol a day!  Please apply buddy tape to the area and use the foot plate  If no improvement in 4 weeks, follow up with orthopedic  Continue low salt diet and exercise  Continue HCTZ for blood pressure, check bp at home, we will recheck in 1 month  Call clinic [phone] with any questinos  Treatment Goals:  Goals (1 Years of Data) as of 11/06/12         As of Today 10/22/12 10/03/12 10/03/12 09/19/12     Blood Pressure    . Blood Pressure < 140/90  139/92 139/97 145/92 150/96 160/106      Progress Toward Treatment Goals:  Treatment Goal 11/06/2012  Blood pressure improved    Self Care Goals & Plans:  Self Care Goal 11/06/2012  Manage my medications take my medicines as prescribed  Monitor my health -  Eat healthy foods eat more vegetables; eat foods that are low in salt  Be physically active -       Care Management & Community Referrals:  Referral 10/03/2012  Referrals made for care management support none needed

## 2012-11-07 NOTE — Assessment & Plan Note (Addendum)
Improved pain, has not been taking any tylenol for pain relief.  Has continue to work and stand on his feet despite fracture.  Has not been using buddy tape or wooden shoe often.  Toe non-tender to palpation, moving all toes, denies pain at this time, still appears swollen.    -supportive therapy at this time -recommended continuing with buddy tape and and wooden shoe -was recommended to follow up with ortho in 2 weeks on last clinic visit, however cannot afford their office visit and thus will try to refer to [facility] if no improvement -monitor for at least 4 weeks with supportive therapy -recommend ice prn to area to help with swelling -rest foot without aggravating too much at work

## 2012-11-07 NOTE — Progress Notes (Signed)
Subjective:   Patient ID: [name] male   DOB: 21-Jun-1956 57 y.o.   MRN: [id number]  HPI: [name] is a 57 y.o. pleasant African American male with PMH of HTN, prior CVA, and hyperlipidemia presenting to clinic today for follow up visit of HTN and R toe fracture.  He was recently seen by Dr. Saralyn Pilar in [facility] on 10/23/12 for right toe pain s/p injury and found to have a proximal phalangeal fracture of the fourth digit.   He has not been applying his buddy tape and has continued to work as a Administrator.  He reports much improvement in pain and also has noticed improvement in swelling.  He is able to walk and work without difficulty and has used his wooden shoe at times at home.    In regards to his HTN, he has been compliant with his HCTZ and has been working to improve his diet by eating more healthy and less fast food.   Past Medical History  Diagnosis Date  . Hypertension   . Stroke     posterior limb, R internal capsule stroke 07/2012  . ETOH abuse     quit  . Cocaine abuse     quit  . Hyperlipidemia    Current Outpatient Prescriptions  Medication Sig Dispense Refill  . aspirin EC 81 MG EC tablet Take 1 tablet (81 mg total) by mouth daily.  30 tablet  5  . hydrochlorothiazide (HYDRODIURIL) 25 MG tablet Take 1 tablet (25 mg total) by mouth daily.  30 tablet  1  . peg 3350 powder (MOVIPREP) 100 G SOLR Moviprep as directed, no substitutions  1 kit  0  . pravastatin (PRAVACHOL) 20 MG tablet Take 1 tablet (20 mg total) by mouth daily.  30 tablet  5   No current facility-administered medications for this visit.   Family History  Problem Relation Age of Onset  . Colon cancer Neg Hx   . Esophageal cancer Neg Hx   . Rectal cancer Neg Hx   . Stomach cancer Neg Hx    History   Social History  . Marital Status: Single    Spouse Name: N/A    Number of Children: N/A  . Years of Education: N/A   Social History Main Topics  . Smoking status: Former Smoker    Quit  date: 07/18/2012  . Smokeless tobacco: Never Used  . Alcohol Use: No     Comment: quite 2011  . Drug Use: No     Comment: h/o cocaine abuse  . Sexually Active: None   Other Topics Concern  . None   Social History Narrative  . None   Review of Systems: Constitutional: Denies fever, chills, diaphoresis, appetite change and fatigue.  HEENT: Denies photophobia, eye pain, redness, hearing loss, ear pain, congestion, sore throat, rhinorrhea, sneezing, mouth sores, trouble swallowing, neck pain, neck stiffness and tinnitus.   Respiratory: Denies SOB, DOE, cough, chest tightness,  and wheezing.   Cardiovascular: Denies chest pain, palpitations and leg swelling.  Gastrointestinal: Denies nausea, vomiting, abdominal pain, diarrhea, constipation, blood in stool and abdominal distention.  Genitourinary: Denies dysuria, urgency, frequency, hematuria, flank pain and difficulty urinating.  Musculoskeletal: R toe fracture.  Denies myalgias, back pain, arthralgias and gait problem.  Skin: Denies pallor, rash and wound.  Neurological: Denies dizziness, seizures, syncope, weakness, light-headedness, numbness and headaches.  Hematological: Denies adenopathy. Easy bruising, personal or family bleeding history  Psychiatric/Behavioral: Denies suicidal ideation, mood changes, confusion, nervousness, sleep disturbance  and agitation  Objective:  Physical Exam: Filed Vitals:   11/06/12 1611  BP: 139/92  Pulse: 71  Temp: 97.7 F (36.5 C)  TempSrc: Oral  Height: 5\' 6"  (1.676 m)  Weight: 145 lb 8 oz (65.998 kg)  SpO2: 96%   General: AAOX3, NAD Head: Normocephalic and atraumatic.  Eyes: Vision grossly intact, PERRLA, EOMI  Mouth: Pharynx pink and moist, no erythema, and no exudates.  Neck: Supple, full ROM Lungs: Normal respiratory effort, CTA B/L, no accessory muscle use, no crackles, and no wheezes. Heart: Normal rate, regular rhythm, no murmur, no gallop, and no rub.  Abdomen: Soft, non-tender,  normal bowel sounds, non-distended, non-obese Msk: R 4th toe swelling, non-tender to palpation, moving all toes.  Moving all extremities. Pulses: 2+ DP/PT pulses bilaterally Extremities: No cyanosis, clubbing, edema Neurologic: Alert & oriented X3, cranial nerves II-XII intact, strength normal in all extremities, sensation intact to light touch, and gait normal.  Skin: Turgor normal and no rashes.  Psych: Oriented X3, memory intact for recent and remote, normally interactive, good eye contact, not anxious appearing, and not depressed appearing. Assessment & Plan:  Discussed with Dr. Josem Kaufmann  HTN: improving on each visit. Continue current HCTZ, recheck 1 month, if not at goal, will need 2nd agent. Recommended lifestyle changes at this time  R toe fracture: healing but has not been using much supportive treatment.  Buddy tape and wooden shoe with ice and tylenol prn for now with decreased pressure to area. If no improvement in 3-4 weeks, he would like to then proceed with ortho referral that he can afford.

## 2012-11-07 NOTE — Assessment & Plan Note (Signed)
BP Readings from Last 3 Encounters:  11/06/12 139/92  10/22/12 139/97  10/03/12 145/92   Lab Results  Component Value Date   NA 140 10/03/2012   K 4.1 10/03/2012   CREATININE 0.77 10/03/2012   Assessment: Blood pressure control: mildly elevated Progress toward BP goal:  improved Comments: continue current regimen, counseled to decrease salt intake and exercise as tolerated, may be able to keep on mono-therapy  Plan: Medications:  continue current medications HCTZ 25mg  qd Educational resources provided: brochure Self management tools provided: home blood pressure logbook Other plans: recheck in 1 month, if no improvement, will need 2nd agent.  ACEi intolerance in the past due to cough.

## 2012-11-08 ENCOUNTER — Encounter: Payer: Self-pay | Admitting: Internal Medicine

## 2012-11-08 ENCOUNTER — Ambulatory Visit (AMBULATORY_SURGERY_CENTER): Payer: No Typology Code available for payment source | Admitting: Internal Medicine

## 2012-11-08 VITALS — BP 150/96 | HR 63 | Temp 96.5°F | Resp 17 | Ht 67.0 in | Wt 144.0 lb

## 2012-11-08 DIAGNOSIS — Z1211 Encounter for screening for malignant neoplasm of colon: Secondary | ICD-10-CM

## 2012-11-08 DIAGNOSIS — D126 Benign neoplasm of colon, unspecified: Secondary | ICD-10-CM

## 2012-11-08 MED ORDER — SODIUM CHLORIDE 0.9 % IV SOLN: 500.0000 mL | INTRAVENOUS | Status: DC

## 2012-11-08 NOTE — Patient Instructions (Addendum)

## 2012-11-08 NOTE — Progress Notes (Signed)
No complaints noted in the recovery room. Maw  Patient did not experience any of the following events: a burn prior to discharge; a fall within the facility; wrong site/side/patient/procedure/implant event; or a hospital transfer or hospital admission upon discharge from the facility. (G8907) Patient did not have preoperative order for IV antibiotic SSI prophylaxis. (G8918)  

## 2012-11-08 NOTE — Op Note (Signed)
[facility] [address] [place], [address]   COLONOSCOPY PROCEDURE REPORT  PATIENT: Christopher, [name]  MR#: [id number] BIRTHDATE: 1955/09/16 , 56  yrs. old GENDER: Male ENDOSCOPIST: Beverley Fiedler, MD REFERRED BY: Darden Palmer, MD PROCEDURE DATE:  11/08/2012 PROCEDURE:   Colonoscopy with cold biopsy polypectomy ASA CLASS:   Class II INDICATIONS:average risk screening and first colonoscopy. MEDICATIONS: MAC sedation, administered by CRNA and propofol (Diprivan) 200mg  IV  DESCRIPTION OF PROCEDURE:   After the risks benefits and alternatives of the procedure were thoroughly explained, informed consent was obtained.  A digital rectal exam revealed no rectal mass.   The LB CF-[id number]  endoscope was introduced through the anus and advanced to the cecum, which was identified by both the appendix and ileocecal valve. No adverse events experienced. The quality of the prep was good, using MoviPrep  The instrument was then slowly withdrawn as the colon was fully examined.   COLON FINDINGS: A sessile polyp measuring 3 mm in size was found in the descending colon.  A polypectomy was performed with cold forceps.  The resection was complete and the polyp tissue was completely retrieved.   The colon mucosa was otherwise normal. Retroflexed views revealed no abnormalities. The time to cecum=3 minutes 43 seconds.  Withdrawal time=8 minutes 40 seconds.  The scope was withdrawn and the procedure completed. COMPLICATIONS: There were no complications.  ENDOSCOPIC IMPRESSION: 1.   Sessile polyp measuring 3 mm in size was found in the descending colon; polypectomy was performed with cold forceps 2.   The colon mucosa was otherwise normal  RECOMMENDATIONS: 1.  Await pathology results 2.  If the polyp removed today is proven to be an adenomatous (pre-cancerous) polyp, you will need a repeat colonoscopy in 5 years.  Otherwise you should continue to follow colorectal  cancer screening guidelines for "routine risk" patients with colonoscopy in 10 years.  You will receive a letter within 1-2 weeks with the results of your biopsy as well as final recommendations.  Please call my office if you have not received a letter after 3 weeks.   eSigned:  Beverley Fiedler, MD 11/08/2012 11:42 AM   cc: The Patient

## 2012-11-08 NOTE — Progress Notes (Signed)
Case discussed with Dr. Qureshi at the time of the visit.  We reviewed the resident's history and exam and pertinent patient test results.  I agree with the assessment, diagnosis and plan of care documented in the resident's note. 

## 2012-11-08 NOTE — Progress Notes (Signed)
Called to room to assist during endoscopic procedure.  Patient ID and intended procedure confirmed with present staff. Received instructions for my participation in the procedure from the performing physician.  

## 2012-11-09 ENCOUNTER — Telehealth: Payer: Self-pay | Admitting: *Deleted

## 2012-11-09 NOTE — Telephone Encounter (Signed)
  Follow up Call-  Call back number 11/08/2012  Post procedure Call Back phone  # [phone]  Permission to leave phone message Yes   spoke with pt's father; pt was gone  Patient questions:  Do you have a fever, pain , or abdominal swelling? no Pain Score  0 *  Have you tolerated food without any problems? yes  Have you been able to return to your normal activities? yes  Do you have any questions about your discharge instructions: Diet   no Medications  no Follow up visit  no  Do you have questions or concerns about your Care? no  Actions: * If pain score is 4 or above: No action needed, pain <4.

## 2012-11-14 ENCOUNTER — Other Ambulatory Visit: Payer: Self-pay | Admitting: Internal Medicine

## 2012-11-14 ENCOUNTER — Encounter: Payer: Self-pay | Admitting: Internal Medicine

## 2012-12-18 ENCOUNTER — Encounter: Payer: No Typology Code available for payment source | Admitting: Internal Medicine

## 2013-01-15 ENCOUNTER — Encounter: Payer: No Typology Code available for payment source | Admitting: Internal Medicine

## 2013-01-21 ENCOUNTER — Ambulatory Visit (INDEPENDENT_AMBULATORY_CARE_PROVIDER_SITE_OTHER): Payer: No Typology Code available for payment source | Admitting: Internal Medicine

## 2013-01-21 ENCOUNTER — Encounter: Payer: Self-pay | Admitting: Internal Medicine

## 2013-01-21 VITALS — BP 136/97 | HR 91 | Temp 97.1°F | Ht 68.5 in | Wt 141.4 lb

## 2013-01-21 DIAGNOSIS — I10 Essential (primary) hypertension: Secondary | ICD-10-CM

## 2013-01-21 DIAGNOSIS — S8290XS Unspecified fracture of unspecified lower leg, sequela: Secondary | ICD-10-CM

## 2013-01-21 DIAGNOSIS — IMO0002 Reserved for concepts with insufficient information to code with codable children: Secondary | ICD-10-CM

## 2013-01-21 DIAGNOSIS — S92911S Unspecified fracture of right toe(s), sequela: Secondary | ICD-10-CM

## 2013-01-21 MED ORDER — AMLODIPINE BESYLATE 5 MG PO TABS: 2.5000 mg | ORAL_TABLET | Freq: Every day | ORAL | Status: DC

## 2013-01-21 NOTE — Patient Instructions (Signed)
General Instructions:  Please start taking amlodipine 2.5mg  daily along with your HCTZ 25mg    Continue your aspirin and cholesterol medicine  Return to see me in 6-8 weeks  It was nice seeing you, call me if you have any questions or concerns [phone]  Treatment Goals:  Goals (1 Years of Data) as of 01/21/13         As of Today As of Today 11/08/12 11/08/12 11/08/12     Blood Pressure    . Blood Pressure < 140/90  136/97 146/100 150/96 143/103 125/88      Progress Toward Treatment Goals:  Treatment Goal 01/21/2013  Blood pressure deteriorated    Self Care Goals & Plans:  Self Care Goal 01/21/2013  Manage my medications take my medicines as prescribed  Monitor my health -  Eat healthy foods -  Be physically active -   Care Management & Community Referrals:  Referral 10/03/2012  Referrals made for care management support none needed     Amlodipine tablets What is this medicine? AMLODIPINE (am LOE di peen) is a calcium-channel blocker. It affects the amount of calcium found in your heart and muscle cells. This relaxes your blood vessels, which can reduce the amount of work the heart has to do. This medicine is used to lower high blood pressure. It is also used to prevent chest pain. This medicine may be used for other purposes; ask your health care provider or pharmacist if you have questions. What should I tell my health care provider before I take this medicine? They need to know if you have any of these conditions: -heart problems like heart failure or aortic stenosis -liver disease -an unusual or allergic reaction to amlodipine, other medicines, foods, dyes, or preservatives -pregnant or trying to get pregnant -breast-feeding How should I use this medicine? Take this medicine by mouth with a glass of water. Follow the directions on the prescription label. You can take the tablets with or without food. Do not change the amount of grapefruit juice you drink from day to day  while taking this drug, or avoid grapefruit juice altogether. Take your medicine at regular intervals. Do not take more medicine than directed. Talk to your pediatrician regarding the use of this medicine in children. Special care may be needed. This medicine has been used in children as young as 6. Persons over 18 years old may have a stronger reaction to this medicine and need smaller doses. Overdosage: If you think you have taken too much of this medicine contact a poison control center or emergency room at once. NOTE: This medicine is only for you. Do not share this medicine with others. What if I miss a dose? If you miss a dose, take it as soon as you can. If it is almost time for your next dose, take only that dose. Do not take double or extra doses. What may interact with this medicine? -herbal or dietary supplements -local or general anesthetics -medicines for high blood pressure -medicines for prostate problems -rifampin This list may not describe all possible interactions. Give your health care provider a list of all the medicines, herbs, non-prescription drugs, or dietary supplements you use. Also tell them if you smoke, drink alcohol, or use illegal drugs. Some items may interact with your medicine. What should I watch for while using this medicine? Visit your doctor or health care professional for regular check ups. Check your blood pressure and pulse rate regularly. Ask your health care professional what your  blood pressure and pulse rate should be, and when you should contact him or her. This medicine may make you feel confused, dizzy or lightheaded. Do not drive, use machinery, or do anything that needs mental alertness until you know how this medicine affects you. To reduce the risk of dizzy or fainting spells, do not sit or stand up quickly, especially if you are an older patient. Avoid alcoholic drinks; they can make you more dizzy. Do not suddenly stop taking amlodipine. Ask your  doctor or health care professional how you can gradually reduce the dose. What side effects may I notice from receiving this medicine? Side effects that you should report to your doctor or health care professional as soon as possible: -allergic reactions like skin rash, itching or hives, swelling of the face, lips, or tongue -breathing problems -changes in vision or hearing -chest pain -fast, irregular heartbeat -swelling of legs or ankles Side effects that usually do not require medical attention (report to your doctor or health care professional if they continue or are bothersome): -dry mouth -facial flushing -nausea, vomiting -stomach gas, pain -tired, weak -trouble sleeping This list may not describe all possible side effects. Call your doctor for medical advice about side effects. You may report side effects to FDA at 1-800-FDA-1088. Where should I keep my medicine? Keep out of the reach of children. Store at room temperature between 59 and 86 degrees F (15 and 30 degrees C). Protect from light. Keep container tightly closed. Throw away any unused medicine after the expiration date. NOTE: This sheet is a summary. It may not cover all possible information. If you have questions about this medicine, talk to your doctor, pharmacist, or health care provider.  2013, [employer] Standard. (09/14/2007 4:01:50 PM)

## 2013-01-21 NOTE — Progress Notes (Signed)
Subjective:   Patient ID: [name] male   DOB: [birth year] 57 y.o.   MRN: [id number]  HPI: [name] is a 57 y.o. African American male with PMH of HTN, prior CVA, and hyperlipidemia presenting to clinic today for follow up visit of HTN and R toe fracture.  His initial BP today was 146/100 and he claims he always feels anxious when he first comes to the office.  After talking with him for some time, his BP improved to 136/97.  He reports he did not sleep very well for the past two nights but he does not know why.  He says maybe due to "bad nerves" but he doesn't really have any worry right now and he doesn't wish to discuss anything at this time either.  I did let him know that he can always call to speak with me if something is troubling him or come to clinic to be seen by one of the providers which he says he knows and will do so if needed.  Otherwise, he reports being in a happy monogamous relationship.  His toe has healed, gives him no problems, no pain.  He continues to work as a Administrator.   Past Medical History  Diagnosis Date  . Hypertension   . Stroke     posterior limb, R internal capsule stroke 07/2012  . ETOH abuse     quit  . Cocaine abuse     quit  . Hyperlipidemia    Current Outpatient Prescriptions  Medication Sig Dispense Refill  . aspirin EC 81 MG EC tablet Take 1 tablet (81 mg total) by mouth daily.  30 tablet  5  . hydrochlorothiazide (HYDRODIURIL) 25 MG tablet TAKE ONE TABLET BY MOUTH ONCE DAILY  30 tablet  3  . peg 3350 powder (MOVIPREP) 100 G SOLR Moviprep as directed, no substitutions  1 kit  0  . pravastatin (PRAVACHOL) 20 MG tablet Take 1 tablet (20 mg total) by mouth daily.  30 tablet  5  . amLODipine (NORVASC) 5 MG tablet Take 0.5 tablets (2.5 mg total) by mouth daily.  30 tablet  0   No current facility-administered medications for this visit.   Family History  Problem Relation Age of Onset  . Colon cancer Neg Hx   . Esophageal cancer Neg  Hx   . Rectal cancer Neg Hx   . Stomach cancer Neg Hx    History   Social History  . Marital Status: Single    Spouse Name: N/A    Number of Children: N/A  . Years of Education: N/A   Social History Main Topics  . Smoking status: Former Smoker    Quit date: 07/18/2012  . Smokeless tobacco: Never Used  . Alcohol Use: No     Comment: quite 2011  . Drug Use: No     Comment: h/o cocaine abuse  . Sexually Active: Yes    Birth Control/ Protection: None   Other Topics Concern  . None   Social History Narrative  . None   Review of Systems:  Constitutional:  Denies fever, chills, diaphoresis, appetite change and fatigue.   HEENT:  Denies congestion, sore throat, rhinorrhea, sneezing, mouth sores, trouble swallowing, neck pain   Respiratory:  Denies SOB, DOE, cough, and wheezing.   Cardiovascular:  Denies palpitations and leg swelling.   Gastrointestinal:  Denies nausea, vomiting, abdominal pain, diarrhea, constipation, blood in stool and abdominal distention.   Genitourinary:  Denies dysuria, urgency, frequency,  hematuria, flank pain and difficulty urinating.   Musculoskeletal:  Denies myalgias, back pain, joint swelling, arthralgias and gait problem.   Skin:  Denies pallor, rash and wound.   Neurological:  Feels anxious at times.  Denies dizziness, seizures, syncope, weakness, light-headedness, numbness and headaches.    Objective:  Physical Exam: Filed Vitals:   01/21/13 1313 01/21/13 1345  BP: 146/100 136/97  Pulse: 78 91  Temp: 97.1 F (36.2 C)   TempSrc: Oral   Height: 5' 8.5" (1.74 m)   Weight: 141 lb 6.4 oz (64.139 kg)   SpO2: 96%   Vitals reviewed. General: sitting in chair NAD HEENT: PERRL, EOMI, no scleral icterus Cardiac: RRR, no rubs, murmurs or gallops Pulm: clear to auscultation bilaterally, no wheezes, rales, or rhonchi Abd: soft, nontender, nondistended, BS present Ext: warm and well perfused, no pedal edema, +2dp b/l, no erythema or tenderness to  palpation of feet and toes Neuro: alert and oriented X3, cranial nerves II-XII grossly intact, strength and sensation to light touch equal in bilateral upper and lower extremities  Assessment & Plan:  Discussed with Dr. Rogelia Boga HTN: component of elevation likely due to white coat syndrome, however, diastolic remains elevated on rechecks, will add 2.5mg  norvasc to regimen and continue hctz 25mg  qd.

## 2013-01-21 NOTE — Assessment & Plan Note (Signed)
Reports well healed, no pain or swelling.  Able to walk and work with no complaints.

## 2013-01-23 NOTE — Progress Notes (Signed)
Case discussed with Dr. Qureshi at the time of the visit.  We reviewed the resident's history and exam and pertinent patient test results.  I agree with the assessment, diagnosis, and plan of care documented in the resident's note. 

## 2013-02-15 ENCOUNTER — Other Ambulatory Visit (HOSPITAL_COMMUNITY): Payer: Self-pay | Admitting: Internal Medicine

## 2013-03-19 ENCOUNTER — Encounter: Payer: Self-pay | Admitting: Internal Medicine

## 2013-03-19 ENCOUNTER — Ambulatory Visit (INDEPENDENT_AMBULATORY_CARE_PROVIDER_SITE_OTHER): Payer: No Typology Code available for payment source | Admitting: Internal Medicine

## 2013-03-19 VITALS — BP 128/95 | HR 76 | Temp 97.5°F | Ht 67.5 in | Wt 138.3 lb

## 2013-03-19 DIAGNOSIS — H109 Unspecified conjunctivitis: Secondary | ICD-10-CM | POA: Insufficient documentation

## 2013-03-19 DIAGNOSIS — H1045 Other chronic allergic conjunctivitis: Secondary | ICD-10-CM

## 2013-03-19 DIAGNOSIS — H1012 Acute atopic conjunctivitis, left eye: Secondary | ICD-10-CM

## 2013-03-19 MED ORDER — KETOTIFEN FUMARATE 0.025 % OP SOLN: 1.0000 [drp] | Freq: Two times a day (BID) | OPHTHALMIC | Status: DC

## 2013-03-19 NOTE — Assessment & Plan Note (Addendum)
Patient's symptoms are most likely caused by allergic conjunctivitis. Patient does not have any alarming symptoms, such as a purulent discharge, vision loss or tenderness. No foreign body on eye examination.  He does not have fever or chills. No any rashes on his body.   - Treat with antihistamine, ketotifen eye drop  - Patient is instructed to come back to the hospital if his symptoms get worse.  - I discussed with Dr. Meredith Pel, who suggested to let patient see ophthalmology if his symptoms does not improve in 2 days due to a possibility of foreign body.  I will call patient to let him know Dr. Meredith Pel suggestion.

## 2013-03-19 NOTE — Progress Notes (Signed)
Patient ID: [name], male   DOB: [birth year], 57 y.o.   MRN: [id number] Subjective:   Patient ID: [name] male   DOB: 1956/05/09 57 y.o.   MRN: [id number]  CC:  Acute visit.  HPI:  [name] is a 57 y.o.  Man with past medical history as outlined below, who present for an acute visit today.  Patient reports that he started having itches in his left eye. In the morning he has rubbing feeling in his left eye. There is no any discharge, but he has a very mild photophobia. He does not have vision loss. He does not have any pain over both eyes. 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 He does not have any dust or foreign bodies entered his eyes. The problem has remained same since it started. He does not have fever, chills, cough, shortness of breath. No any rashes in his body.  ROS:  Denies fever, chills, fatigue, headaches,  cough, chest pain, SOB,  abdominal pain,diarrhea, constipation, dysuria, urgency, frequency, hematuria, joint pain or leg swelling.  Past Medical History  Diagnosis Date  . Hypertension   . Stroke     posterior limb, R internal capsule stroke 07/2012  . ETOH abuse     quit  . Cocaine abuse     quit  . Hyperlipidemia    Current Outpatient Prescriptions  Medication Sig Dispense Refill  . amLODipine (NORVASC) 5 MG tablet Take 0.5 tablets (2.5 mg total) by mouth daily.  30 tablet  0  . EQ ASPIRIN ADULT LOW DOSE 81 MG EC tablet TAKE ONE  BY MOUTH EVERY DAY  30 tablet  11  . hydrochlorothiazide (HYDRODIURIL) 25 MG tablet TAKE ONE TABLET BY MOUTH ONCE DAILY  30 tablet  3  . pravastatin (PRAVACHOL) 20 MG tablet TAKE ONE TABLET BY MOUTH EVERY DAY  30 tablet  11   No current facility-administered medications for this visit.   Family History  Problem Relation Age of Onset  . Colon cancer Neg Hx   . Esophageal cancer Neg Hx   . Rectal cancer Neg Hx   . Stomach cancer Neg Hx    History   Social History  . Marital Status: Single    Spouse Name:  N/A    Number of Children: N/A  . Years of Education: N/A   Social History Main Topics  . Smoking status: Former Smoker    Quit date: 07/18/2012  . Smokeless tobacco: Never Used  . Alcohol Use: No     Comment: quite 2011  . Drug Use: No     Comment: h/o cocaine abuse  . Sexual Activity: Yes    Birth Control/ Protection: None   Other Topics Concern  . None   Social History Narrative  . None    Review of Systems: Full 14-point review of systems otherwise negative. See HPI.  General: AAOX3, NAD Head: Normocephalic and atraumatic.  Eyes: Vision grossly intact, PERRLA, EOMI. There is very mild redness on the conjunctiva in the medial corner of left eye. There is no foreign bodies, no any discharge. There is no any tenderness over both eyes.  Mouth: Pharynx pink and moist, no erythema, and no exudates.  Neck: Supple, full ROM Lungs: Normal respiratory effort, CTA B/L, no accessory muscle use, no crackles, and no wheezes. Heart: Normal rate, regular rhythm, no murmur, no gallop, and no rub.  Abdomen: Soft, non-tender, normal bowel sounds, non-distended, non-obese Pulses: 2+ DP/PT pulses bilaterally Extremities:  No cyanosis, clubbing, edema Neurologic: Alert & oriented X3, cranial nerves II-XII intact, strength normal in all extremities, sensation intact to light touch, and gait normal.  Skin: Turgor normal and no rashes.  Psych: Oriented X3, memory intact for recent and remote, normally interactive, good eye contact, not anxious appearing, and not depressed appearing.   Objective:  Physical Exam: Filed Vitals:   03/19/13 0847  BP: 128/95  Pulse: 76  Temp: 97.5 F (36.4 C)  TempSrc: Oral  Height: 5' 7.5" (1.715 m)  Weight: 138 lb 4.8 oz (62.732 kg)  SpO2: 98%    Assessment & Plan:

## 2013-03-19 NOTE — Progress Notes (Signed)
Case discussed with Dr. Niu soon after the resident saw the patient.  We reviewed the resident's history and exam and pertinent patient test results.  I agree with the assessment, diagnosis, and plan of care documented in the resident's note. 

## 2013-03-19 NOTE — Patient Instructions (Addendum)
1. Please use the Ketotifen eye drop for your left eye itches as prescribed. 2. Please take all medications as prescribed.  3. If you have worsening of your symptoms or new symptoms arise, please call the clinic ([phone]), or go to the ER immediately if symptoms are severe.  You have done great job in taking all your medications. I appreciate it very much. Please continue doing that.  Allergic Conjunctivitis The conjunctiva is a thin membrane that covers the visible white part of the eyeball and the underside of the eyelids. This membrane protects and lubricates the eye. The membrane has small blood vessels running through it that can normally be seen. When the conjunctiva becomes inflamed, the condition is called conjunctivitis. In response to the inflammation, the conjunctival blood vessels become swollen. The swelling results in redness in the normally white part of the eye. The blood vessels of this membrane also react when a person has allergies and is then called allergic conjunctivitis. This condition usually lasts for as long as the allergy persists. Allergic conjunctivitis cannot be passed to another person (non-contagious). The likelihood of bacterial infection is great and the cause is not likely due to allergies if the inflamed eye has:  A sticky discharge.  Discharge or sticking together of the lids in the morning.  Scaling or flaking of the eyelids where the eyelashes come out.  Red swollen eyelids. CAUSES   Viruses.  Irritants such as foreign bodies.  Chemicals.  General allergic reactions.  Inflammation or serious diseases in the inside or the outside of the eye or the orbit (the boney cavity in which the eye sits) can cause a "red eye." SYMPTOMS   Eye redness.  Tearing.  Itchy eyes.  Burning feeling in the eyes.  Clear drainage from the eye.  Allergic reaction due to pollens or ragweed sensitivity. Seasonal allergic conjunctivitis is frequent in the spring  when pollens are in the air and in the fall. DIAGNOSIS  This condition, in its many forms, is usually diagnosed based on the history and an ophthalmological exam. It usually involves both eyes. If your eyes react at the same time every year, allergies may be the cause. While most "red eyes" are due to allergy or an infection, the role of an eye (ophthalmological) exam is important. The exam can rule out serious diseases of the eye or orbit. TREATMENT   Non-antibiotic eye drops, ointments, or medications by mouth may be prescribed if the ophthalmologist is sure the conjunctivitis is due to allergies alone.  Over-the-counter drops and ointments for allergic symptoms should be used only after other causes of conjunctivitis have been ruled out, or as your caregiver suggests. Medications by mouth are often prescribed if other allergy-related symptoms are present. If the ophthalmologist is sure that the conjunctivitis is due to allergies alone, treatment is normally limited to drops or ointments to reduce itching and burning. HOME CARE INSTRUCTIONS   Wash hands before and after applying drops or ointments, or touching the inflamed eye(s) or eyelids.  Do not let the eye dropper tip or ointment tube touch the eyelid when putting medicine in your eye.  Stop using your soft contact lenses and throw them away. Use a new pair of lenses when recovery is complete. You should run through sterilizing cycles at least three times before use after complete recovery if the old soft contact lenses are to be used. Hard contact lenses should be stopped. They need to be thoroughly sterilized before use after recovery.  Itching and burning eyes due to allergies is often relieved by using a cool cloth applied to closed eye(s). SEEK MEDICAL CARE IF:   Your problems do not go away after two or three days of treatment.  Your lids are sticky (especially in the morning when you wake up) or stick together.  Discharge  develops. Antibiotics may be needed either as drops, ointment, or by mouth.  You have extreme light sensitivity.  An oral temperature above 102 F (38.9 C) develops.  Pain in or around the eye or any other visual symptom develops. MAKE SURE YOU:   Understand these instructions.  Will watch your condition.  Will get help right away if you are not doing well or get worse. Document Released: 09/24/2002 Document Revised: 09/26/2011 Document Reviewed: 08/20/2007 [facility] Patient Information 2014 [place], [place].

## 2013-03-28 ENCOUNTER — Other Ambulatory Visit: Payer: Self-pay | Admitting: Internal Medicine

## 2013-03-29 NOTE — Telephone Encounter (Signed)
Has he been taking his bp medications? I approved the refill for HCTZ but per epic it seems he may have been out since July.

## 2013-04-02 ENCOUNTER — Encounter: Payer: Self-pay | Admitting: Internal Medicine

## 2013-04-02 ENCOUNTER — Ambulatory Visit (INDEPENDENT_AMBULATORY_CARE_PROVIDER_SITE_OTHER): Payer: Self-pay | Admitting: Internal Medicine

## 2013-04-02 VITALS — BP 136/96 | HR 86 | Temp 97.4°F | Ht 68.0 in | Wt 137.0 lb

## 2013-04-02 DIAGNOSIS — H109 Unspecified conjunctivitis: Secondary | ICD-10-CM

## 2013-04-02 DIAGNOSIS — Z23 Encounter for immunization: Secondary | ICD-10-CM

## 2013-04-02 DIAGNOSIS — I10 Essential (primary) hypertension: Secondary | ICD-10-CM

## 2013-04-02 MED ORDER — AMLODIPINE BESYLATE 5 MG PO TABS: 2.5000 mg | ORAL_TABLET | Freq: Every day | ORAL | Status: DC

## 2013-04-02 NOTE — Assessment & Plan Note (Signed)
x1 month in duration. 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  Denies any vision loss or change, no blurriness, discharge, or abnormal watering, but has irritation feeling with sensation of grittiness or something is stuck in there on the corner. Erythema visible on physical exam. No visible foreign body.   -irrigated eye in clinic for approximately 1 minutes, that is how long he wished to irrigate and then said he will try to irrigate at home -needs opthalmology referral but orange card has expired, needs to renew asap and then will refer -advised to notify us if worsening of condition, notices abnormal discharge or watering, pain, photophobia, worsening of erythema, vision change or vision loss to go to ED directly

## 2013-04-02 NOTE — Assessment & Plan Note (Signed)
BP Readings from Last 3 Encounters:  04/02/13 136/96  03/19/13 128/95  01/21/13 136/97   Lab Results  Component Value Date   NA 140 10/03/2012   K 4.1 10/03/2012   CREATININE 0.77 10/03/2012    Assessment: Blood pressure control: controlled Progress toward BP goal:  at goal Comments: has not taken norvasc in 2-3 days. Will pick up from pharmacy today he claims  Plan: Medications:  continue current medications norvasc 5mg  and hctz 25mg  Educational resources provided:   Self management tools provided:   Other plans:

## 2013-04-02 NOTE — Progress Notes (Addendum)
Subjective:   Patient ID: [name] male   DOB: July 08, 1956 57 y.o.   MRN: [id number]  HPI: [name] is a 57 y.o. African American male with PMH of HTN, prior CVA, and hyperlipidemia presenting to clinic today for follow up visit of HTN and left eye conjunctivitis. He was last seen by Dr. Clyde Lundborg in [facility] on 03/19/13 and thought to have allergic conjunctivitis. He was prescribed ketotifen drops at that time but he did not get it filled. He claims to have left eye irritation in his left eye x1 month and does not recall how it started.  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  He did not irrigate his eye when he first felt the discomfort.  He feels like something is stuck in his eye but he denies pain, photophobia, vision change or loss.  The irritation is worse when he wakes up and improves over the day. No discharge or eye watering. He has no complaints with the right eye. He denies irrigating his eye and has not used any drops.   In regards to his blood pressure, he has been out of his norvasc for 3-4 days but says he will go to [facility] department to pick it up today.   Past Medical History  Diagnosis Date  . Hypertension   . Stroke     posterior limb, R internal capsule stroke 07/2012  . ETOH abuse     quit  . Cocaine abuse     quit  . Hyperlipidemia    Current Outpatient Prescriptions  Medication Sig Dispense Refill  . amLODipine (NORVASC) 5 MG tablet Take 0.5 tablets (2.5 mg total) by mouth daily.  30 tablet  0  . EQ ASPIRIN ADULT LOW DOSE 81 MG EC tablet TAKE ONE  BY MOUTH EVERY DAY  30 tablet  11  . hydrochlorothiazide (HYDRODIURIL) 25 MG tablet TAKE ONE TABLET BY MOUTH ONCE DAILY  30 tablet  10  . ketotifen (RA ANTIHISTAMINE EYE DROPS) 0.025 % ophthalmic solution Place 1 drop into the left eye 2 (two) times daily.  5 mL  0  . pravastatin (PRAVACHOL) 20 MG tablet TAKE ONE TABLET BY MOUTH EVERY DAY  30 tablet  11   No current facility-administered  medications for this visit.   Family History  Problem Relation Age of Onset  . Colon cancer Neg Hx   . Esophageal cancer Neg Hx   . Rectal cancer Neg Hx   . Stomach cancer Neg Hx    History   Social History  . Marital Status: Single    Spouse Name: N/A    Number of Children: N/A  . Years of Education: N/A   Social History Main Topics  . Smoking status: Former Smoker    Quit date: 07/18/2012  . Smokeless tobacco: Never Used  . Alcohol Use: No     Comment: quite 2011  . Drug Use: No     Comment: h/o cocaine abuse  . Sexual Activity: Yes    Birth Control/ Protection: None   Other Topics Concern  . Not on file   Social History Narrative  . No narrative on file   Review of Systems:  Constitutional:  Denies fever, chills, diaphoresis, appetite change and fatigue.   HEENT:  L eye redness and irritation. Denies congestion, sore throat, rhinorrhea, sneezing, mouth sores, trouble swallowing, neck pain   Respiratory:  Denies SOB, DOE, cough, and wheezing.   Cardiovascular:  Denies chest pain, palpitations, and leg swelling.   Gastrointestinal:  Denies nausea, vomiting, abdominal pain, diarrhea, constipation, blood in stool and abdominal distention.   Genitourinary:  Denies dysuria, urgency, frequency, hematuria, flank pain and difficulty urinating.   Musculoskeletal:  Denies myalgias, back pain, joint swelling, arthralgias and gait problem.   Skin:  Denies pallor, rash and wound.   Neurological:  Denies dizziness, seizures, syncope, weakness, light-headedness, numbness and headaches.    Objective:  Physical Exam: Filed Vitals:   04/02/13 1321  BP: 136/96  Pulse: 86  Temp: 97.4 F (36.3 C)  TempSrc: Oral  Height: 5\' 8"  (1.727 m)  Weight: 137 lb (62.143 kg)  SpO2: 97%   Vitals reviewed. General: sitting in chair, NAD HEENT: L eye erythema with pterygium, PERRL, EOMI Cardiac: RRR, no rubs, murmurs or gallops Pulm: clear to auscultation bilaterally, no wheezes, rales,  or rhonchi Abd: soft, nontender, nondistended, BS present Ext: warm and well perfused, no pedal edema, +2DP B/L Neuro: alert and oriented X3, cranial nerves II-XII grossly intact, strength and sensation to light touch equal in bilateral upper and lower extremities  Assessment & Plan:  Discussed with Dr. Kem Kays Flu vaccine given today  Irrigated eye in opc today, needs to get orange card renewed and then will need optho referral

## 2013-04-03 NOTE — Progress Notes (Signed)
INTERNAL MEDICINE TEACHING ATTENDING ADDENDUM - Jonah Blue, DO, FACP: I reviewed with the resident Girard Cooter, MD, Zelphia Cairo medical history, physical examination, diagnosis and results of tests and treatment and I agree with the patient's care as documented.

## 2013-04-03 NOTE — Telephone Encounter (Signed)
Tried to telephone pt, no response

## 2013-05-08 ENCOUNTER — Telehealth: Payer: Self-pay | Admitting: Internal Medicine

## 2013-05-08 NOTE — Telephone Encounter (Signed)
Rec'd Correspondence from [facility].  Referral was faxed again in September.  Referral was faxed back to our office due to patient's [facility] expiring on 03/22/2013. Patient needs to renew [facility] prior to specialty care appointment being scheduled.

## 2014-03-14 ENCOUNTER — Other Ambulatory Visit: Payer: Self-pay | Admitting: *Deleted

## 2014-03-14 NOTE — Telephone Encounter (Signed)
Last office visit 04/02/13

## 2014-03-17 MED ORDER — PRAVASTATIN SODIUM 20 MG PO TABS: ORAL_TABLET | ORAL | Status: DC

## 2014-03-18 ENCOUNTER — Encounter: Payer: Self-pay | Admitting: Internal Medicine

## 2014-04-29 ENCOUNTER — Other Ambulatory Visit: Payer: Self-pay | Admitting: *Deleted

## 2014-04-30 MED ORDER — HYDROCHLOROTHIAZIDE 25 MG PO TABS: ORAL_TABLET | ORAL | Status: DC

## 2014-05-01 ENCOUNTER — Ambulatory Visit: Payer: Self-pay

## 2014-05-14 ENCOUNTER — Encounter: Payer: Self-pay | Admitting: Internal Medicine

## 2014-05-14 ENCOUNTER — Ambulatory Visit (INDEPENDENT_AMBULATORY_CARE_PROVIDER_SITE_OTHER): Payer: Self-pay | Admitting: Internal Medicine

## 2014-05-14 VITALS — BP 128/96 | HR 70 | Temp 98.0°F | Ht 68.0 in | Wt 150.3 lb

## 2014-05-14 DIAGNOSIS — R05 Cough: Secondary | ICD-10-CM

## 2014-05-14 DIAGNOSIS — K219 Gastro-esophageal reflux disease without esophagitis: Secondary | ICD-10-CM

## 2014-05-14 DIAGNOSIS — J069 Acute upper respiratory infection, unspecified: Secondary | ICD-10-CM | POA: Insufficient documentation

## 2014-05-14 DIAGNOSIS — B9789 Other viral agents as the cause of diseases classified elsewhere: Secondary | ICD-10-CM

## 2014-05-14 DIAGNOSIS — Z23 Encounter for immunization: Secondary | ICD-10-CM

## 2014-05-14 DIAGNOSIS — I10 Essential (primary) hypertension: Secondary | ICD-10-CM

## 2014-05-14 DIAGNOSIS — Z0000 Encounter for general adult medical examination without abnormal findings: Secondary | ICD-10-CM | POA: Insufficient documentation

## 2014-05-14 MED ORDER — OMEPRAZOLE 20 MG PO CPDR: 20.0000 mg | DELAYED_RELEASE_CAPSULE | Freq: Every day | ORAL | Status: DC

## 2014-05-14 MED ORDER — AMLODIPINE BESYLATE 5 MG PO TABS: 2.5000 mg | ORAL_TABLET | Freq: Every day | ORAL | Status: DC

## 2014-05-14 NOTE — Assessment & Plan Note (Signed)
Intermittent, some indigestion sensation but mostly vague abdominal discomfort at times. Cannot specify when the pain starts or stops, currently has none. Also feels bloated and like he has has at times. Hx of substance abuse and alcohol. Currently does not smoke cigarettes or use alcohol or other drugs. Does drink coffee and eat food with hot sauce.   -trial of PPI x6 weeks to see if any improvement

## 2014-05-14 NOTE — Progress Notes (Signed)
Subjective:   Patient ID: [name] male   DOB: 12-27-1955 58 y.o.   MRN: [id number]  HPI: [name] is a 58 y.o. male with HTN presenting to opc today for routine visit.   HTN--has not taken norvasc in several months. Initial BP 124/92, on repeat 152/98, but on manual 128/96. He was well controlled in the past when he did take the norvasc but he will see if he can afford it at this time and restart.   Occasional abdominal discomfort--hard to explain, not really pain, but sometimes feels crampy. Occasional indigestion feeling, not worse at any particular time of day, comes and goes, feels bloated at times as well. Hx of alcohol and substance abuse but has quit for several years. Does not smoke. Drinks coffee at least twice a day. Denies N/V/D or abdominal pain at this time.   Viral URI--had a cold ~ 2 weeks ago very slow to get better. Occasional productive cough and some congestion. Denies fever chills sob or chest pain. We dicussed otc mucinex or robitussin to help with symptoms. He wants to make sure robitussin does not have alcohol in the syrup. I check with our pharmacist who said only night formulary will have alcohol, but he is recommended to check with pharmacist at first.   Flu and tdap vaccines today  Past Medical History  Diagnosis Date  . Hypertension   . Stroke     posterior limb, R internal capsule stroke 07/2012  . ETOH abuse     quit  . Cocaine abuse     quit  . Hyperlipidemia    Current Outpatient Prescriptions  Medication Sig Dispense Refill  . hydrochlorothiazide (HYDRODIURIL) 25 MG tablet TAKE ONE TABLET BY MOUTH ONCE DAILY  30 tablet  5  . pravastatin (PRAVACHOL) 20 MG tablet TAKE ONE TABLET BY MOUTH EVERY DAY  30 tablet  11  . amLODipine (NORVASC) 5 MG tablet Take 0.5 tablets (2.5 mg total) by mouth daily.  30 tablet  3  . EQ ASPIRIN ADULT LOW DOSE 81 MG EC tablet TAKE ONE  BY MOUTH EVERY DAY  30 tablet  11  . omeprazole (PRILOSEC) 20 MG capsule  Take 1 capsule (20 mg total) by mouth daily.  60 capsule  3   No current facility-administered medications for this visit.   Family History  Problem Relation Age of Onset  . Colon cancer Neg Hx   . Esophageal cancer Neg Hx   . Rectal cancer Neg Hx   . Stomach cancer Neg Hx    History   Social History  . Marital Status: Single    Spouse Name: N/A    Number of Children: N/A  . Years of Education: N/A   Social History Main Topics  . Smoking status: Former Smoker    Quit date: 07/18/2012  . Smokeless tobacco: Never Used  . Alcohol Use: No     Comment: quit 2011  . Drug Use: No     Comment: h/o cocaine abuse  . Sexual Activity: None   Other Topics Concern  . None   Social History Narrative  . None   Review of Systems:  Constitutional:  Denies fever, chills  HEENT:  Congestion and occasional productive cough  Respiratory:  Denies SOB  Cardiovascular:  Denies chest pain  Gastrointestinal:  Denies nausea, vomiting, diarrhea. Occasional abdominal discomfort  Genitourinary:  Denies dysuria  Musculoskeletal:  Denies gait problem.   Skin:  Denies pallor, rash and wound.  Neurological:  Denies headaches.    Objective:  Physical Exam: Filed Vitals:   05/14/14 0951  BP: 124/92  Pulse: 70  Temp: 98 F (36.7 C)  TempSrc: Oral  Height: 5\' 8"  (1.727 m)  Weight: 150 lb 4.8 oz (68.176 kg)  SpO2: 99%   Vitals reviewed. General: sitting in chair, NAD HEENT: EOMI Cardiac: RRR Pulm: clear to auscultation bilaterally, no wheezes, rales, or rhonchi Abd: soft, nontender, nondistended, BS present Ext: warm and well perfused, no pedal edema Neuro: alert and oriented X3, strength and sensation to light touch equal in bilateral upper and lower extremities  Assessment & Plan:  Discussed with Dr. Ellwood Dense Start PPI x6 week norvasc 2.5mg  mucinex or robitussin dm

## 2014-05-14 NOTE — Progress Notes (Signed)
Patient ID: [name], male   DOB: 03-14-1956, 58 y.o.   MRN: [id number] Case discussed with Dr. Eula Fried soon after the resident saw the patient.  We reviewed the resident's history and exam and pertinent patient test results.  I agree with the assessment, diagnosis, and plan of care documented in the resident's note.

## 2014-05-14 NOTE — Patient Instructions (Addendum)
General Instructions:  Please bring your medicines with you each time you come to clinic.  Medicines may include prescription medications, over-the-counter medications, herbal remedies, eye drops, vitamins, or other pills.  Please restart your amlodipine 2.5mg  daily along with your HCTZ for your blood pressure and come back to see me in 2-3 weeks  Please start taking prilosec 20mg  daily for 6 weeks and lets see if your symptoms improve  Try mucinex for congestion relief or robitussin WITHOUT alcohol syrup (check with pharmacy to make sure you are getting the one without alcohol  Try to cut back on coffee use  Progress Toward Treatment Goals:  Treatment Goal 05/14/2014  Blood pressure improved    Self Care Goals & Plans:  Self Care Goal 05/14/2014  Manage my medications bring my medications to every visit; take my medicines as prescribed  Monitor my health -  Eat healthy foods eat more vegetables; eat foods that are low in salt; eat baked foods instead of fried foods  Be physically active take a walk every day    No flowsheet data found.   Care Management & Community Referrals:  Referral 10/03/2012  Referrals made for care management support none needed

## 2014-05-14 NOTE — Assessment & Plan Note (Signed)
x2 weeks at least, slowly getting better, cough can be productive at times with white phlegm. Minimal congestion.   -mucinex or robitussin dm (without alcohol) syrup, whatever he can afford for symptomatic relief

## 2014-05-14 NOTE — Assessment & Plan Note (Signed)
Flu and tdap vaccines today

## 2014-05-14 NOTE — Assessment & Plan Note (Signed)
BP Readings from Last 3 Encounters:  05/14/14 128/96  04/02/13 136/96  03/19/13 128/95   Lab Results  Component Value Date   NA 140 10/03/2012   K 4.1 10/03/2012   CREATININE 0.77 10/03/2012   Assessment: Blood pressure control: mildly elevated Progress toward BP goal:  improved Comments: diastolic elevation on manual repeat as well  Plan: Medications:  continue current medications restart low dose norvasc 2.5mg  in addition to hctz 25mg  for now. If he cannot afford it at this time, he will let us know and we will monitor and recheck on next visit.  Educational resources provided:   Self management tools provided:   Other plans: advised if restarting norvasc, if he starts to feel dizzy or lightheaded or feeling like he may faint, his blood pressure might be too low and to stop norvasc and call to let me know.  Recheck 2-3 weeks when he can schedule from his work

## 2014-05-20 ENCOUNTER — Encounter: Payer: Self-pay | Admitting: Internal Medicine

## 2014-05-27 ENCOUNTER — Telehealth: Payer: Self-pay | Admitting: *Deleted

## 2014-05-27 NOTE — Telephone Encounter (Signed)
Pt called - no change with stomach. Pt states taking Prilosec at [place] an appt 05/28/14. Appt made 05/28/14 9:15AM Dr Eula Fried. Hilda Blades Mariska Daffin RN 05/27/14 9:30AM

## 2014-05-28 ENCOUNTER — Ambulatory Visit (INDEPENDENT_AMBULATORY_CARE_PROVIDER_SITE_OTHER): Payer: Self-pay | Admitting: Internal Medicine

## 2014-05-28 ENCOUNTER — Encounter: Payer: Self-pay | Admitting: Internal Medicine

## 2014-05-28 VITALS — BP 132/95 | HR 80 | Temp 97.5°F | Ht 68.0 in | Wt 146.0 lb

## 2014-05-28 DIAGNOSIS — R1013 Epigastric pain: Secondary | ICD-10-CM | POA: Insufficient documentation

## 2014-05-28 DIAGNOSIS — I10 Essential (primary) hypertension: Secondary | ICD-10-CM

## 2014-05-28 DIAGNOSIS — H1132 Conjunctival hemorrhage, left eye: Secondary | ICD-10-CM | POA: Insufficient documentation

## 2014-05-28 LAB — CBC WITH DIFFERENTIAL/PLATELET
Basophils Absolute: 0 10*3/uL (ref 0.0–0.1)
Basophils Relative: 0 % (ref 0–1)
Eosinophils Absolute: 0.2 10*3/uL (ref 0.0–0.7)
Eosinophils Relative: 4 % (ref 0–5)
HEMATOCRIT: 48 % (ref 39.0–52.0)
HEMOGLOBIN: 16.2 g/dL (ref 13.0–17.0)
LYMPHS ABS: 1.1 10*3/uL (ref 0.7–4.0)
LYMPHS PCT: 24 % (ref 12–46)
MCH: 29.8 pg (ref 26.0–34.0)
MCHC: 33.8 g/dL (ref 30.0–36.0)
MCV: 88.2 fL (ref 78.0–100.0)
MONO ABS: 0.3 10*3/uL (ref 0.1–1.0)
MONOS PCT: 6 % (ref 3–12)
NEUTROS ABS: 3.1 10*3/uL (ref 1.7–7.7)
Neutrophils Relative %: 66 % (ref 43–77)
Platelets: 288 10*3/uL (ref 150–400)
RBC: 5.44 MIL/uL (ref 4.22–5.81)
RDW: 12.5 % (ref 11.5–15.5)
WBC: 4.7 10*3/uL (ref 4.0–10.5)

## 2014-05-28 LAB — COMPLETE METABOLIC PANEL WITH GFR
ALK PHOS: 79 U/L (ref 39–117)
ALT: 17 U/L (ref 0–53)
AST: 22 U/L (ref 0–37)
Albumin: 4.2 g/dL (ref 3.5–5.2)
BILIRUBIN TOTAL: 0.6 mg/dL (ref 0.3–1.2)
BUN: 17 mg/dL (ref 6–23)
CO2: 31 mEq/L (ref 19–32)
Calcium: 9.9 mg/dL (ref 8.4–10.5)
Chloride: 96 mEq/L (ref 96–112)
Creat: 0.73 mg/dL (ref 0.50–1.35)
GFR, Est African American: >89 mL/min
GLUCOSE: 96 mg/dL (ref 70–99)
Potassium: 3.6 mEq/L (ref 3.5–5.3)
SODIUM: 139 meq/L (ref 135–145)
Total Protein: 7.8 g/dL (ref 6.0–8.3)

## 2014-05-28 LAB — POC HEMOCCULT BLD/STL (OFFICE/1-CARD/DIAGNOSTIC): Fecal Occult Blood, POC: NEGATIVE

## 2014-05-28 LAB — HIV ANTIBODY (ROUTINE TESTING W REFLEX): HIV: NONREACTIVE

## 2014-05-28 LAB — LIPASE: Lipase: 26 U/L (ref 11–59)

## 2014-05-28 LAB — LACTIC ACID, PLASMA: LACTIC ACID: 1.6 mmol/L (ref 0.5–2.2)

## 2014-05-28 MED ORDER — AMLODIPINE BESYLATE 5 MG PO TABS: 5.0000 mg | ORAL_TABLET | Freq: Every day | ORAL | Status: DC

## 2014-05-28 NOTE — Progress Notes (Addendum)
Subjective:   Patient ID: [name] male   DOB: 1956-06-11 58 y.o.   MRN: [id number]  HPI: [name] is a 58 y.o. male with HTN presenting to [facility] today for follow up visit of abdominal discomfort.   Abdominal pain--no relief since starting prilosec 2 weeks ago.  Admits to using a colon cleanse pill every day for the past month but stopped after our list visit. Since being on that otc medication from the [employer], he has had loose BMs, occasionally dark. However, despite stopping the pill, he has had continued diarrhea and more since last night. The stool was dark/almost black yesterday but changed to light yellow after drinking pepto bismol yesterday which provided some mild relief. He admits to having occasional dark stools in the past that resolves on its own. Denies camping, drinking well water, any bug or tick bites.   The abdominal pain is crampy, intermittent, none right now, but sometimes at worst 7/10, located mainly upper abdomen above umbilicus, does not radiate to back or groin, better after BM and passing gas, feels like it gets better after eating then starts back up later. Occasional nausea but no vomiting. No nausea, vomiting, fever or chills at this time. He has been taking the prilosec daily.   He is not taking any NSAIDs. Hx of significant alcohol abuse but has stopped for several years. Admits to smoking cigarettes in the past as well but now a heavy smoker.   HTN--low dose norvasc restarted last visit and continued on hctz 25mg . He has been compliant with medications, takes at night, DBP still in 90s.   Past Medical History  Diagnosis Date  . Hypertension   . Stroke     posterior limb, R internal capsule stroke 07/2012  . ETOH abuse     quit  . Cocaine abuse     quit  . Hyperlipidemia    Current Outpatient Prescriptions  Medication Sig Dispense Refill  . amLODipine (NORVASC) 5 MG tablet Take 0.5 tablets (2.5 mg total) by mouth daily. 30 tablet 3  .  EQ ASPIRIN ADULT LOW DOSE 81 MG EC tablet TAKE ONE  BY MOUTH EVERY DAY 30 tablet 11  . hydrochlorothiazide (HYDRODIURIL) 25 MG tablet TAKE ONE TABLET BY MOUTH ONCE DAILY 30 tablet 5  . omeprazole (PRILOSEC) 20 MG capsule Take 1 capsule (20 mg total) by mouth daily. 60 capsule 3  . pravastatin (PRAVACHOL) 20 MG tablet TAKE ONE TABLET BY MOUTH EVERY DAY 30 tablet 11   No current facility-administered medications for this visit.   Family History  Problem Relation Age of Onset  . Colon cancer Neg Hx   . Esophageal cancer Neg Hx   . Rectal cancer Neg Hx   . Stomach cancer Neg Hx    History   Social History  . Marital Status: Single    Spouse Name: N/A    Number of Children: N/A  . Years of Education: N/A   Social History Main Topics  . Smoking status: Former Smoker    Quit date: 07/18/2012  . Smokeless tobacco: Never Used  . Alcohol Use: No     Comment: quit 2011  . Drug Use: No     Comment: h/o cocaine abuse  . Sexual Activity: Not on file   Other Topics Concern  . Not on file   Social History Narrative   Review of Systems:  Constitutional:  Denies fever, chills  HEENT:  Denies congestion  Respiratory:  Denies  SOB  Cardiovascular:  Denies chest pain  Gastrointestinal:  Occasional nausea, crampy abdominal pain, diarrhea  Genitourinary:  Denies dysuria  Musculoskeletal:  Denies gait problem.   Skin:  Denies pallor, rash and wound.   Neurological:  Denies headaches.    Objective:  Physical Exam: Filed Vitals:   05/28/14 0929  BP: 132/95  Pulse: 80  Temp: 97.5 F (36.4 C)  TempSrc: Oral  Weight: 146 lb (66.225 kg)  SpO2: 99%   Vitals reviewed. General: sitting in chair, NAD HEENT: EOMI, subconjunctival hemorrhage medial corner of left eye, small white speck on medial surface of left sclera Cardiac: RRR, no rubs, murmurs or gallops Pulm: clear to auscultation bilaterally, no wheezes, rales, or rhonchi Abd: soft, nontender, nondistended, BS present Ext: warm  and well perfused, moving all extremities, -edema Rectal: no external hemorrhoids or blood on glove, loose yellow stool on glove during exam, fobt neg Neuro: alert and oriented X3, strength and sensation to light touch equal in bilateral upper and lower extremities  Assessment & Plan:  Discussed with Dr. Dareen Piano

## 2014-05-28 NOTE — Assessment & Plan Note (Signed)
Acute. He did not notice it. No pain in eye or irritation. No tearing. Does have small white speck on medial surface of left eye ball that does not appear to be removable. No change in vision. Unclear if this has been there for a while. No foreign body sensation per patient.   Monitor for now, if still present or any eye discomfort he is advised to call us right away and may need optho referral right away. Hopefully better visualization with resolution of hemorrhage that he says might be after leaf blowing outside

## 2014-05-28 NOTE — Assessment & Plan Note (Addendum)
x2-3 weeks. Associated with diarrhea with acute worsening of diarrhea overnight. fobt negative. Some nausea. No abdominal pain, nausea, fever, or chills at this time.   Differential at this time remains broad.   Could be side effects of the colon cleanser he was taking x 1 month, possible colitis with infectious etiology vs. ?Cdiff (on PPI but was having complaints before starting) vs. Viral gastroenteritis vs. Parasitic with ?giardia (watery diarrhea, crampy abdominal pain, but no exposure to outside water or camping lately) vs. Pancreatitis (less likely given exam and no recent alcohol use) vs. Mesenteric ischemia (also less likely given no abdominal pain today, no tenderness on exam, fobt negative). I am doubtful of gallstones given localization of pain. Duodenal ulcer could be possible given improvement of pain with food among other differentials.   Will start with broad work up today -cbc, cmet, lactic acid, lipase, hiv -will give stool cup for sample to be brought and sent for stool studies including fat, lactoferrin, giardia, culture, and if possible pathogen panel -continue prilosec for now (diarrhea could be side effect of PPI but it is not new) -follow up 1-2 weeks

## 2014-05-28 NOTE — Assessment & Plan Note (Signed)
BP Readings from Last 3 Encounters:  05/28/14 132/95  05/14/14 128/96  04/02/13 136/96    Lab Results  Component Value Date   NA 140 10/03/2012   K 4.1 10/03/2012   CREATININE 0.77 10/03/2012   Assessment: Blood pressure control: mildly elevated Progress toward BP goal:  unchanged Comments: compliant with norvasc and hctz  Plan: Medications:  increase norvasc to 5mg  and continue hctz 25mg  Educational resources provided:   Self management tools provided:   Other plans: follow up 2 weeks for BP check

## 2014-05-28 NOTE — Patient Instructions (Signed)
General Instructions:  Please bring your medicines with you each time you come to clinic.  Medicines may include prescription medications, over-the-counter medications, herbal remedies, eye drops, vitamins, or other pills.  Please bring back your stool sample to the lab as soon as possible Please increase your norvasc for blood pressure to 5mg  daily (2 pills of the 2.5mg  tablets) and continue your hctz  Progress Toward Treatment Goals:  Treatment Goal 05/28/2014  Blood pressure unchanged    Self Care Goals & Plans:  Self Care Goal 05/28/2014  Manage my medications take my medicines as prescribed; bring my medications to every visit; refill my medications on time  Monitor my health -  Eat healthy foods drink diet soda or water instead of juice or soda; eat more vegetables; eat foods that are low in salt; eat baked foods instead of fried foods  Be physically active -    No flowsheet data found.   Care Management & Community Referrals:  Referral 10/03/2012  Referrals made for care management support none needed

## 2014-05-28 NOTE — Addendum Note (Signed)
Addended by: Truddie Crumble on: 05/28/2014 11:11 AM   Modules accepted: Orders

## 2014-05-29 ENCOUNTER — Other Ambulatory Visit: Payer: Self-pay

## 2014-05-29 DIAGNOSIS — R1013 Epigastric pain: Secondary | ICD-10-CM

## 2014-05-30 LAB — GASTROINTESTINAL PATHOGEN PANEL PCR
C. difficile Tox A/B, PCR: NEGATIVE
Campylobacter, PCR: NEGATIVE
Cryptosporidium, PCR: NEGATIVE
E COLI (ETEC) LT/ST, PCR: NEGATIVE
E COLI (STEC) STX1/STX2, PCR: NEGATIVE
E COLI 0157, PCR: NEGATIVE
Giardia lamblia, PCR: NEGATIVE
Norovirus, PCR: NEGATIVE
Rotavirus A, PCR: NEGATIVE
SALMONELLA, PCR: NEGATIVE
SHIGELLA, PCR: NEGATIVE

## 2014-05-30 LAB — FECAL LACTOFERRIN, QUANT: Lactoferrin: POSITIVE

## 2014-05-30 LAB — OVA AND PARASITE EXAMINATION: OP: NONE SEEN

## 2014-05-30 LAB — FECAL FAT QUALITATIVE
Free Fatty Acids: INCREASED
NEUTRAL FAT: NORMAL

## 2014-05-30 LAB — CLOSTRIDIUM DIFFICILE BY PCR: Toxigenic C. Difficile by PCR: NOT DETECTED

## 2014-06-01 NOTE — Progress Notes (Signed)
INTERNAL MEDICINE TEACHING ATTENDING ADDENDUM - Chinelo Benn, MD: I reviewed and discussed at the time of visit with the resident Dr. Qureshi, the patient's medical history, physical examination, diagnosis and results of pertinent tests and treatment and I agree with the patient's care as documented.  

## 2014-07-14 ENCOUNTER — Ambulatory Visit: Payer: Self-pay

## 2014-07-15 ENCOUNTER — Ambulatory Visit: Payer: Self-pay

## 2014-11-11 ENCOUNTER — Other Ambulatory Visit: Payer: Self-pay | Admitting: Internal Medicine

## 2014-11-27 ENCOUNTER — Encounter: Payer: Self-pay | Admitting: *Deleted

## 2015-03-26 ENCOUNTER — Other Ambulatory Visit: Payer: Self-pay | Admitting: Internal Medicine

## 2015-03-31 NOTE — Telephone Encounter (Signed)
Pt is out of meds.  Please refill. He has appointment for next month

## 2015-04-22 ENCOUNTER — Ambulatory Visit: Payer: Self-pay | Admitting: Internal Medicine

## 2015-04-22 ENCOUNTER — Encounter: Payer: Self-pay | Admitting: Internal Medicine

## 2015-04-22 ENCOUNTER — Ambulatory Visit (INDEPENDENT_AMBULATORY_CARE_PROVIDER_SITE_OTHER): Payer: Commercial Managed Care - HMO | Admitting: Internal Medicine

## 2015-04-22 VITALS — BP 162/100 | HR 64 | Temp 98.0°F | Ht 68.0 in | Wt 159.4 lb

## 2015-04-22 DIAGNOSIS — E785 Hyperlipidemia, unspecified: Secondary | ICD-10-CM | POA: Diagnosis not present

## 2015-04-22 DIAGNOSIS — Z79899 Other long term (current) drug therapy: Secondary | ICD-10-CM | POA: Diagnosis not present

## 2015-04-22 DIAGNOSIS — E7849 Other hyperlipidemia: Secondary | ICD-10-CM | POA: Insufficient documentation

## 2015-04-22 DIAGNOSIS — I10 Essential (primary) hypertension: Secondary | ICD-10-CM

## 2015-04-22 DIAGNOSIS — Z0000 Encounter for general adult medical examination without abnormal findings: Secondary | ICD-10-CM

## 2015-04-22 DIAGNOSIS — N529 Male erectile dysfunction, unspecified: Secondary | ICD-10-CM | POA: Diagnosis not present

## 2015-04-22 MED ORDER — PRAVASTATIN SODIUM 20 MG PO TABS: 20.0000 mg | ORAL_TABLET | Freq: Every day | ORAL | Status: DC

## 2015-04-22 MED ORDER — ASPIRIN 81 MG PO TBEC: DELAYED_RELEASE_TABLET | ORAL | Status: DC

## 2015-04-22 MED ORDER — HYDROCHLOROTHIAZIDE 25 MG PO TABS: 25.0000 mg | ORAL_TABLET | Freq: Every day | ORAL | Status: DC

## 2015-04-22 MED ORDER — TADALAFIL 10 MG PO TABS: 10.0000 mg | ORAL_TABLET | ORAL | Status: DC | PRN

## 2015-04-22 MED ORDER — AMLODIPINE BESYLATE 5 MG PO TABS: 5.0000 mg | ORAL_TABLET | Freq: Every day | ORAL | Status: DC

## 2015-04-22 NOTE — Progress Notes (Signed)
   Subjective:    Patient ID: COLTAN SPINELLO, male    DOB: 06-08-1956, 59 y.o.   MRN: [id number]  HPI  Mr. Norrod is here for general follow-up of his HTN and HLD. His only complaint was that he was requesting Cialis for ED.  Review of Systems  Constitutional: Negative for fever.  Eyes: Negative for visual disturbance.  Respiratory: Negative for shortness of breath.   Cardiovascular: Negative for chest pain.  Neurological: Negative for syncope and headaches.       Objective:   Physical Exam  Constitutional: He appears well-developed and well-nourished. No distress.  HENT:  Mouth/Throat: Oropharynx is clear and moist.  Eyes: EOM are normal. Pupils are equal, round, and reactive to light.  Cardiovascular: Normal rate and regular rhythm.   Pulmonary/Chest: Effort normal and breath sounds normal. No respiratory distress.  Abdominal: Soft. Bowel sounds are normal. He exhibits no distension. There is no tenderness.  Musculoskeletal: He exhibits no edema.  Neurological: He is alert. He displays normal reflexes.  Skin: Skin is warm and dry.  Psychiatric: He has a normal mood and affect. His behavior is normal.          Assessment & Plan:  See problem-based assessment and plan for details.

## 2015-04-22 NOTE — Assessment & Plan Note (Signed)
-   Hep C screening - Flu shot today

## 2015-04-22 NOTE — Patient Instructions (Signed)
[name], it was a pleasure meeting you today. You are in excellent health. We are getting a few blood tests today, and we'll give you a call if any are abnormal. We're also starting you on a drug to help you get an erection. If you start to get light-headed on this drug, and feel like you might pass out, seek medical attention immediately. If you have an erection that lasts more than 4 hours, seek medical attention immediately.

## 2015-04-22 NOTE — Progress Notes (Signed)
Medicine attending: I personally interviewed and briefly examined this patient, and reviewed pertinent clinical laboratory  data  with resident physician Dr.Jeremy Ford and we discussed a   management plan. 

## 2015-04-22 NOTE — Assessment & Plan Note (Signed)
>>  ASSESSMENT AND PLAN FOR DYSLIPIDEMIA WRITTEN ON 04/22/2015  2:58 PM BY PRIMUS DITCH, MD  Has been adherent with pravastatin , tolerating medication well. - Refill pravastatin . - Lipid panel today

## 2015-04-22 NOTE — Assessment & Plan Note (Signed)
[name] reports that he has difficulty obtaining an erection. Specifically requesting Cialis. I explained the risks and benefits of Cialis while on antihypertensives. I informed to him to seek medical attention if he feels like he's going to pass out, his heart starts to race, or if he gets light-headed during intercourse.  - Prescribed taladafil

## 2015-04-22 NOTE — Assessment & Plan Note (Addendum)
BP elevated at 162/100, but says he has not taken amlodipine in two weeks since he ran out.  - Refill amlodipine 5 mg - Refill HCTZ 25 mg

## 2015-04-22 NOTE — Assessment & Plan Note (Signed)
Has been adherent with pravastatin, tolerating medication well. - Refill pravastatin. - Lipid panel today

## 2015-04-23 LAB — LIPID PANEL
CHOL/HDL RATIO: 3.6 ratio (ref 0.0–5.0)
Cholesterol, Total: 172 mg/dL (ref 100–199)
HDL: 48 mg/dL (ref >39)
LDL Calculated: 105 mg/dL — ABNORMAL HIGH (ref 0–99)
TRIGLYCERIDES: 97 mg/dL (ref 0–149)
VLDL Cholesterol Cal: 19 mg/dL (ref 5–40)

## 2015-04-23 LAB — BMP8+ANION GAP
ANION GAP: 22 mmol/L — AB (ref 10.0–18.0)
BUN/Creatinine Ratio: 15 (ref 10–22)
BUN: 11 mg/dL (ref 8–27)
CALCIUM: 9.2 mg/dL (ref 8.6–10.2)
CHLORIDE: 94 mmol/L — AB (ref 97–108)
CO2: 23 mmol/L (ref 18–29)
Creatinine, Ser: 0.73 mg/dL — ABNORMAL LOW (ref 0.76–1.27)
GFR calc Af Amer: 117 mL/min/{1.73_m2} (ref >59)
GFR, EST NON AFRICAN AMERICAN: 101 mL/min/{1.73_m2} (ref >59)
GLUCOSE: 99 mg/dL (ref 65–99)
POTASSIUM: 4 mmol/L (ref 3.5–5.2)
Sodium: 139 mmol/L (ref 134–144)

## 2015-04-23 LAB — HEPATITIS C ANTIBODY (REFLEX)

## 2015-04-23 LAB — HCV COMMENT:

## 2015-05-10 ENCOUNTER — Emergency Department (HOSPITAL_COMMUNITY)
Admission: EM | Admit: 2015-05-10 | Discharge: 2015-05-10 | Disposition: A | Discharge status: Home / Self Care | Payer: Commercial Managed Care - HMO | Source: Home / Self Care | Attending: Emergency Medicine | Admitting: Emergency Medicine

## 2015-05-10 DIAGNOSIS — S39012A Strain of muscle, fascia and tendon of lower back, initial encounter: Secondary | ICD-10-CM | POA: Diagnosis not present

## 2015-05-10 MED ORDER — TRAMADOL HCL 50 MG PO TABS: 50.0000 mg | ORAL_TABLET | Freq: Four times a day (QID) | ORAL | Status: DC | PRN

## 2015-05-10 MED ORDER — HYDROCODONE-ACETAMINOPHEN 5-325 MG PO TABS
1.0000 | ORAL_TABLET | Freq: Once | ORAL | Status: AC
  Administered 2015-05-10: 1 via ORAL

## 2015-05-10 MED ORDER — CYCLOBENZAPRINE HCL 10 MG PO TABS: 10.0000 mg | ORAL_TABLET | Freq: Three times a day (TID) | ORAL | Status: DC | PRN

## 2015-05-10 MED ORDER — IBUPROFEN 800 MG PO TABS: 800.0000 mg | ORAL_TABLET | Freq: Three times a day (TID) | ORAL | Status: DC

## 2015-05-10 MED ORDER — HYDROCODONE-ACETAMINOPHEN 5-325 MG PO TABS
ORAL_TABLET | ORAL | Status: AC
  Filled 2015-05-10: qty 1

## 2015-05-10 NOTE — ED Notes (Signed)
The patient presented to the [facility] with a complaint of back pain. The patient stated that he had been shoveling mulch at work and feels he strained his back.

## 2015-05-10 NOTE — Discharge Instructions (Signed)

## 2015-05-10 NOTE — ED Provider Notes (Signed)
CSN: [id number]     Arrival date & time 05/10/15  1356 History   First MD Initiated Contact with Patient 05/10/15 1557     Chief Complaint  Patient presents with  . Back Pain   (Consider location/radiation/quality/duration/timing/severity/associated sxs/prior Treatment) HPI  [name] is a 59 y.o. male presenting with back pain since last Friday 05-08-15. 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 Pain is worse on the right side. Pain does not radiate and there is not associated numbness/tingling of legs. Patient denies loss of bowel or bladder function. Patient tried using Bengay, which mildly helped relieve the pain. Patient also took "a pain pill" which "knocked the pain right out" but then returned the next day. Pain is worsened when sitting for long periods of time or with movement. Pain makes it difficult to have a fluid gait. Patient is worried about his ability to go back to work tomorrow as he will be mulching again. He requests a work note for light duty. Patient denies fever, chills, headache, visual disturbance, nausea, vomiting, diarrhea, constipation, numbness, tingling, dizziness, or LOC.    Past Medical History  Diagnosis Date  . Hypertension   . Stroke [facility])     posterior limb, R internal capsule stroke 07/2012  . ETOH abuse     quit  . Cocaine abuse     quit  . Hyperlipidemia    Past Surgical History  Procedure Laterality Date  . Hand surgery     Family History  Problem Relation Age of Onset  . Colon cancer Neg Hx   . Esophageal cancer Neg Hx   . Rectal cancer Neg Hx   . Stomach cancer Neg Hx    Social History  Substance Use Topics  . Smoking status: Former Smoker    Quit date: 07/18/2012  . Smokeless tobacco: Never Used  . Alcohol Use: No     Comment: quit 2011    Review of Systems  Constitutional: Negative for fever, chills, appetite  change and fatigue.  HENT: Negative for congestion, rhinorrhea and sore throat.   Eyes: Negative for visual disturbance.  Respiratory: Negative for cough, chest tightness, shortness of breath and wheezing.   Cardiovascular: Negative for chest pain, palpitations and leg swelling.  Gastrointestinal: Negative for nausea, vomiting, abdominal pain, diarrhea and constipation.  Genitourinary: Negative for dysuria and hematuria.  Musculoskeletal: Positive for back pain and gait problem. Negative for neck pain and neck stiffness.  Skin: Negative for color change, pallor, rash and wound.  Neurological: Negative for dizziness, syncope, weakness, light-headedness, numbness and headaches.  Psychiatric/Behavioral: Negative for behavioral problems and self-injury.    Allergies  Ace inhibitors and Penicillins  Home Medications   Prior to Admission medications   Medication Sig Start Date End Date Taking? Authorizing Provider  amLODipine (NORVASC) 5 MG tablet Take 1 tablet (5 mg total) by mouth daily. 04/22/15   Liberty Handy, MD  aspirin (EQ ASPIRIN ADULT LOW DOSE) 81 MG EC tablet TAKE ONE  BY MOUTH EVERY DAY 04/22/15   Liberty Handy, MD  cyclobenzaprine (FLEXERIL) 10 MG tablet Take 1 tablet (10 mg total) by mouth 3 (three) times daily as needed for muscle spasms. 05/10/15   Melony Overly, MD  hydrochlorothiazide (HYDRODIURIL) 25 MG tablet Take 1 tablet (25 mg total) by mouth daily. 04/22/15   Liberty Handy, MD  ibuprofen (ADVIL,MOTRIN) 800 MG tablet Take 1 tablet (  800 mg total) by mouth 3 (three) times daily. 05/10/15   Melony Overly, MD  pravastatin (PRAVACHOL) 20 MG tablet Take 1 tablet (20 mg total) by mouth daily. 04/22/15   Liberty Handy, MD  tadalafil (CIALIS) 10 MG tablet Take 1 tablet (10 mg total) by mouth as needed for erectile dysfunction. 04/22/15 04/21/16  Liberty Handy, MD  traMADol (ULTRAM) 50 MG tablet Take 1 tablet (50 mg total) by mouth every 6 (six) hours as needed. 05/10/15   Melony Overly, MD   Meds  Ordered and Administered this Visit   Medications  HYDROcodone-acetaminophen (NORCO/VICODIN) 5-325 MG per tablet 1 tablet (not administered)    BP 148/108 mmHg  Pulse 81  Temp(Src) 98.8 F (37.1 C) (Oral)  Resp 22  SpO2 98% No data found.   Physical Exam  Constitutional: He is oriented to person, place, and time. He appears well-developed and well-nourished. No distress.  HENT:  Head: Normocephalic and atraumatic.  Mouth/Throat: Oropharynx is clear and moist.  Eyes: Conjunctivae are normal. Pupils are equal, round, and reactive to light.  Neck: Normal range of motion. Neck supple.  Cardiovascular: Normal rate, regular rhythm and normal heart sounds.  Exam reveals no gallop and no friction rub.   No murmur heard. Pulmonary/Chest: Effort normal and breath sounds normal.  Musculoskeletal:       Lumbar back: He exhibits decreased range of motion, tenderness (worse on right lower back) and pain. He exhibits no bony tenderness, no swelling, no edema, no deformity, no laceration, no spasm and normal pulse.  Stiff muscles noted to bilateral lower back with tenderness to palpation. No positive seated straight leg raise. Full 5/5 strength on bilateral lower limbs.   Lymphadenopathy:    He has no cervical adenopathy.  Neurological: He is alert and oriented to person, place, and time. He has normal strength. No sensory deficit. Gait (slow to stand and slightly stiff appearing gait) abnormal.  Skin: Skin is warm and dry. No rash noted. He is not diaphoretic. No erythema. No pallor.  Psychiatric: He has a normal mood and affect. His behavior is normal. Judgment and thought content normal.    ED Course  Procedures (including critical care time)  Labs Review Labs Reviewed - No data to display  Imaging Review No results found.   Visual Acuity Review  Right Eye Distance:   Left Eye Distance:   Bilateral Distance:    Right Eye Near:   Left Eye Near:    Bilateral Near:          MDM   1. Lumbar strain, initial encounter      59 yo male presenting with lower back pain for 2-3 days following mulching at work all week. Patient is afebrile and denies bowel or bladder dysfunction. Patient's lower back is tender to palpation with no numbness or tingling. Seated straight leg raise is not positive. Presentation is consistent with lumbar strain. Patient administered one dose of Norco here and tolerated well. Patient to apply heat to area prn as injury to area occurred over 48 hours ago. Rx Flexeril 10 mg TID prn for muscle relaxation. Patient advised that this medication can make him drowsy and not to take it if he is driving or working. Rx Tramadol 50 mg q6-8 hours prn for moderate to severe pain. Rx Ibuprofen 800 mg TID for pain and inflammation.   Work note given for light duty for 1 week.   Patient voices no other complaints at this time. Patient to  follow up with PCP as needed.   Patient seen and examined with a student. I reviewed and addended the note as necessary.    Melony Overly, MD 05/10/15 [phone]

## 2015-05-10 NOTE — ED Notes (Signed)
Has been shoveling mulch-back -to -back long shifts.  Now has back soreness.

## 2015-05-23 ENCOUNTER — Emergency Department (INDEPENDENT_AMBULATORY_CARE_PROVIDER_SITE_OTHER): Payer: Commercial Managed Care - HMO

## 2015-05-23 ENCOUNTER — Encounter (HOSPITAL_COMMUNITY): Payer: Self-pay | Admitting: Emergency Medicine

## 2015-05-23 ENCOUNTER — Emergency Department (HOSPITAL_COMMUNITY)
Admission: EM | Admit: 2015-05-23 | Discharge: 2015-05-23 | Disposition: A | Discharge status: Home / Self Care | Payer: Commercial Managed Care - HMO | Source: Home / Self Care

## 2015-05-23 DIAGNOSIS — M5410 Radiculopathy, site unspecified: Secondary | ICD-10-CM

## 2015-05-23 MED ORDER — PREDNISONE 5 MG (48) PO TBPK: 5.0000 mg | ORAL_TABLET | Freq: Every day | ORAL | Status: DC

## 2015-05-23 MED ORDER — HYDROCODONE-ACETAMINOPHEN 5-325 MG PO TABS: 1.0000 | ORAL_TABLET | Freq: Four times a day (QID) | ORAL | Status: DC | PRN

## 2015-05-23 NOTE — ED Provider Notes (Signed)
CSN: [id number]     Arrival date & time 05/23/15  1544 History   None    Chief Complaint  Patient presents with  . Back Pain  . Hip Pain   (Consider location/radiation/quality/duration/timing/severity/associated sxs/prior Treatment) HPI Comments: [name] is a 59 yo gentleman who presents with ongoing low back and left buttock pain. He was seen here on 10/23 with same presentation that followed working with mulch in the yard. He was treated by Dr. Bridgett Larsson with NSAID's, Flexeril and Tramadol. Patient reports no improvement as the pain has worsened. He now notes pain that radiates into the left buttock. Worse with standing long periods. He denies weakness or numbness in the legs. No loss of bowel or bladder control is noted.   Patient is a 59 y.o. male presenting with back pain and hip pain. The history is provided by the patient.  Back Pain Associated symptoms: no fever, no numbness and no weakness   Hip Pain    Past Medical History  Diagnosis Date  . Hypertension   . Stroke [facility])     posterior limb, R internal capsule stroke 07/2012  . ETOH abuse     quit  . Cocaine abuse     quit  . Hyperlipidemia    Past Surgical History  Procedure Laterality Date  . Hand surgery     Family History  Problem Relation Age of Onset  . Colon cancer Neg Hx   . Esophageal cancer Neg Hx   . Rectal cancer Neg Hx   . Stomach cancer Neg Hx    Social History  Substance Use Topics  . Smoking status: Former Smoker    Quit date: 07/18/2012  . Smokeless tobacco: Never Used  . Alcohol Use: No     Comment: quit 2011    Review of Systems  Constitutional: Negative for fever.  Musculoskeletal: Positive for back pain.  Skin: Negative.   Neurological: Negative for dizziness, weakness and numbness.  All other systems reviewed and are negative.   Allergies  Ace inhibitors and Penicillins  Home Medications   Prior to Admission medications   Medication Sig Start Date End Date Taking? Authorizing  Provider  amLODipine (NORVASC) 5 MG tablet Take 1 tablet (5 mg total) by mouth daily. 04/22/15   Liberty Handy, MD  aspirin (EQ ASPIRIN ADULT LOW DOSE) 81 MG EC tablet TAKE ONE  BY MOUTH EVERY DAY 04/22/15   Liberty Handy, MD  cyclobenzaprine (FLEXERIL) 10 MG tablet Take 1 tablet (10 mg total) by mouth 3 (three) times daily as needed for muscle spasms. 05/10/15   Melony Overly, MD  hydrochlorothiazide (HYDRODIURIL) 25 MG tablet Take 1 tablet (25 mg total) by mouth daily. 04/22/15   Liberty Handy, MD  HYDROcodone-acetaminophen (NORCO/VICODIN) 5-325 MG tablet Take 1-2 tablets by mouth every 6 (six) hours as needed for moderate pain. 05/23/15   Bjorn Pippin, PA-C  ibuprofen (ADVIL,MOTRIN) 800 MG tablet Take 1 tablet (800 mg total) by mouth 3 (three) times daily. 05/10/15   Melony Overly, MD  pravastatin (PRAVACHOL) 20 MG tablet Take 1 tablet (20 mg total) by mouth daily. 04/22/15   Liberty Handy, MD  predniSONE (STERAPRED UNI-PAK 48 TAB) 5 MG (48) TBPK tablet Take 1 tablet (5 mg total) by mouth daily. Take daily as directed 05/23/15   Bjorn Pippin, PA-C  tadalafil (CIALIS) 10 MG tablet Take 1 tablet (10 mg total) by mouth as needed for erectile dysfunction. 04/22/15 04/21/16  Liberty Handy, MD  traMADol (  ULTRAM) 50 MG tablet Take 1 tablet (50 mg total) by mouth every 6 (six) hours as needed. 05/10/15   Melony Overly, MD   Meds Ordered and Administered this Visit  Medications - No data to display  BP 162/100 mmHg  Pulse 88  Temp(Src) 97.5 F (36.4 C) (Oral)  Resp 20  SpO2 97% No data found.   Physical Exam  Constitutional: He is oriented to person, place, and time. He appears well-developed and well-nourished. No distress.  Musculoskeletal:  No obvious findings in the low back to visualization. Spine is non-tender to palpation. Decreased ROM secondary to pain. Negative SLR and motor intact  Neurological: He is alert and oriented to person, place, and time. He displays normal reflexes. He exhibits normal  muscle tone. Coordination normal.  Skin: Skin is warm and dry. He is not diaphoretic.  Psychiatric: His behavior is normal.  Nursing note and vitals reviewed.   ED Course  Procedures (including critical care time)  Labs Review Labs Reviewed - No data to display  Imaging Review Dg Lumbar Spine Complete  05/23/2015  CLINICAL DATA:  Low back pain with left-sided radiculopathy for several weeks after lifting. EXAM: LUMBAR SPINE - COMPLETE 4+ VIEW COMPARISON:  03/30/2010 lumbar spine radiographs. FINDINGS: This report assumes 5 non rib-bearing lumbar vertebrae, noting diminutive ribs at the T12 level. Lumbar vertebral body heights are preserved, with no fracture or suspicious focal osseous lesion. There is mild-to-moderate degenerative disc disease throughout the visualized thoracolumbar spine, with interval progression, most prominent at L2-3 and L4-5. There is stable minimal 3 mm retrolisthesis at L2-3 and L4-5. There is facet arthropathy bilaterally in the lower lumbar spine. There is no appreciable foraminal stenosis. IMPRESSION: Interval progression of mild-to-moderate degenerative disc disease in the lumbar spine. No acute malalignment. Stable minimal degenerative retrolisthesis at L2-3 and L4-5. Electronically Signed   By: Ilona Sorrel M.D.   On: 05/23/2015 17:50     Visual Acuity Review  Right Eye Distance:   Left Eye Distance:   Bilateral Distance:    Right Eye Near:   Left Eye Near:    Bilateral Near:         MDM   1. Radicular low back pain    This is his 2nd visit. Radiographs show lumbar DDD. Probable nerve impingement without weakness at this point. Suggest use of Prednisone pack and intermittent pain medication. Recommend f/u with Dr. Erlinda Hong for further evaluation and possibly MRI. He expresses understanding. If he worsens please f/u.     Bjorn Pippin, PA-C 05/23/15 [phone]

## 2015-05-23 NOTE — ED Notes (Signed)
The patient presented to the [facility] with a complaint of bilateral back pain and left sided hip pain.

## 2015-05-23 NOTE — Discharge Instructions (Signed)
Radicular Pain Radicular pain in either the arm or leg is usually from a bulging or herniated disk in the spine. A piece of the herniated disk may press against the nerves as the nerves exit the spine. This causes pain which is felt at the tips of the nerves down the arm or leg. Other causes of radicular pain may include:  Fractures.  Heart disease.  Cancer.  An abnormal and usually degenerative state of the nervous system or nerves (neuropathy). Diagnosis may require CT or MRI scanning to determine the primary cause.  Nerves that start at the neck (nerve roots) may cause radicular pain in the outer shoulder and arm. It can spread down to the thumb and fingers. The symptoms vary depending on which nerve root has been affected. In most cases radicular pain improves with conservative treatment. Neck problems may require physical therapy, a neck collar, or cervical traction. Treatment may take many weeks, and surgery may be considered if the symptoms do not improve.  Conservative treatment is also recommended for sciatica. Sciatica causes pain to radiate from the lower back or buttock area down the leg into the foot. Often there is a history of back problems. Most patients with sciatica are better after 2 to 4 weeks of rest and other supportive care. Short term bed rest can reduce the disk pressure considerably. Sitting, however, is not a good position since this increases the pressure on the disk. You should avoid bending, lifting, and all other activities which make the problem worse. Traction can be used in severe cases. Surgery is usually reserved for patients who do not improve within the first months of treatment. Only take over-the-counter or prescription medicines for pain, discomfort, or fever as directed by your caregiver. Narcotics and muscle relaxants may help by relieving more severe pain and spasm and by providing mild sedation. Cold or massage can give significant relief. Spinal manipulation  is not recommended. It can increase the degree of disc protrusion. Epidural steroid injections are often effective treatment for radicular pain. These injections deliver medicine to the spinal nerve in the space between the protective covering of the spinal cord and back bones (vertebrae). Your caregiver can give you more information about steroid injections. These injections are most effective when given within two weeks of the onset of pain.  You should see your caregiver for follow up care as recommended. A program for neck and back injury rehabilitation with stretching and strengthening exercises is an important part of management.  SEEK IMMEDIATE MEDICAL CARE IF:  You develop increased pain, weakness, or numbness in your arm or leg.  You develop difficulty with bladder or bowel control.  You develop abdominal pain.   This information is not intended to replace advice given to you by your health care provider. Make sure you discuss any questions you have with your health care provider.   You probably have an irritated nerve in your back causing pain into your hip. This is your 2nd visit, therefore suggest you follow up with Orthopedics. In the meantime start on the Prednisone pack in the morning and use the Norco for pain relief.    Document Released: 08/11/2004 Document Revised: 07/25/2014 Document Reviewed: 01/28/2015 Elsevier Interactive Patient Education [employer].

## 2015-08-05 ENCOUNTER — Encounter: Payer: Self-pay | Admitting: Internal Medicine

## 2015-08-05 ENCOUNTER — Ambulatory Visit (INDEPENDENT_AMBULATORY_CARE_PROVIDER_SITE_OTHER): Payer: Commercial Managed Care - HMO | Admitting: Internal Medicine

## 2015-08-05 VITALS — BP 141/94 | HR 73 | Temp 97.9°F | Wt 162.5 lb

## 2015-08-05 DIAGNOSIS — M5136 Other intervertebral disc degeneration, lumbar region: Secondary | ICD-10-CM | POA: Diagnosis not present

## 2015-08-05 DIAGNOSIS — R040 Epistaxis: Secondary | ICD-10-CM

## 2015-08-05 DIAGNOSIS — I10 Essential (primary) hypertension: Secondary | ICD-10-CM

## 2015-08-05 DIAGNOSIS — N529 Male erectile dysfunction, unspecified: Secondary | ICD-10-CM | POA: Diagnosis not present

## 2015-08-05 DIAGNOSIS — Z7982 Long term (current) use of aspirin: Secondary | ICD-10-CM

## 2015-08-05 DIAGNOSIS — M51369 Other intervertebral disc degeneration, lumbar region without mention of lumbar back pain or lower extremity pain: Secondary | ICD-10-CM | POA: Insufficient documentation

## 2015-08-05 MED ORDER — TADALAFIL 10 MG PO TABS: 10.0000 mg | ORAL_TABLET | ORAL | Status: DC | PRN

## 2015-08-05 MED ORDER — NAPROXEN 500 MG PO TABS: 500.0000 mg | ORAL_TABLET | Freq: Two times a day (BID) | ORAL | Status: DC

## 2015-08-05 MED ORDER — CYCLOBENZAPRINE HCL 10 MG PO TABS: 10.0000 mg | ORAL_TABLET | Freq: Three times a day (TID) | ORAL | Status: DC | PRN

## 2015-08-05 MED ORDER — AMLODIPINE BESYLATE 5 MG PO TABS: 5.0000 mg | ORAL_TABLET | Freq: Every day | ORAL | Status: DC

## 2015-08-05 NOTE — Progress Notes (Signed)
[facility] Subjective:   Patient ID: [name] male   DOB: August 07, 1955 60 y.o.   MRN: [id number]  HPI: [name] is a 60 y.o. male with a PMH detailed below who presents for an acute visit for low back pain.  Please see problem based charting below for the status of his chronic medical problems.   Past Medical History  Diagnosis Date  . Hypertension   . Stroke [facility])     posterior limb, R internal capsule stroke 07/2012  . ETOH abuse     quit  . Cocaine abuse     quit  . Hyperlipidemia    Current Outpatient Prescriptions  Medication Sig Dispense Refill  . aspirin (EQ ASPIRIN ADULT LOW DOSE) 81 MG EC tablet TAKE ONE  BY MOUTH EVERY DAY 30 tablet 11  . hydrochlorothiazide (HYDRODIURIL) 25 MG tablet Take 1 tablet (25 mg total) by mouth daily. 30 tablet 5  . pravastatin (PRAVACHOL) 20 MG tablet Take 1 tablet (20 mg total) by mouth daily. 30 tablet 5  . tadalafil (CIALIS) 10 MG tablet Take 1 tablet (10 mg total) by mouth as needed for erectile dysfunction. 30 tablet 1  . amLODipine (NORVASC) 5 MG tablet Take 1 tablet (5 mg total) by mouth daily. 30 tablet 5  . cyclobenzaprine (FLEXERIL) 10 MG tablet Take 1 tablet (10 mg total) by mouth 3 (three) times daily as needed for muscle spasms. 30 tablet 1  . naproxen (NAPROSYN) 500 MG tablet Take 1 tablet (500 mg total) by mouth 2 (two) times daily with a meal. 60 tablet 2   No current facility-administered medications for this visit.   Family History  Problem Relation Age of Onset  . Colon cancer Neg Hx   . Esophageal cancer Neg Hx   . Rectal cancer Neg Hx   . Stomach cancer Neg Hx    Social History   Social History  . Marital Status: Single    Spouse Name: N/A  . Number of Children: N/A  . Years of Education: N/A   Social History Main Topics  . Smoking status: Former Smoker    Quit date: 07/18/2012  . Smokeless tobacco: Never Used  . Alcohol Use: No     Comment: quit 2011  . Drug Use: No      Comment: h/o cocaine abuse  . Sexual Activity: Not Asked   Other Topics Concern  . None   Social History Narrative   Review of Systems: Review of Systems  Constitutional: Negative for fever, chills, weight loss and malaise/fatigue.  Respiratory: Negative for shortness of breath.   Cardiovascular: Negative for chest pain.  Gastrointestinal: Negative for heartburn and abdominal pain.  Musculoskeletal: Positive for back pain. Negative for falls.  Neurological: Negative for dizziness, sensory change and speech change.     Objective:  Physical Exam: Filed Vitals:   08/05/15 1255  BP: 141/94  Pulse: 73  Temp: 97.9 F (36.6 C)  TempSrc: Oral  Weight: 162 lb 8 oz (73.71 kg)  SpO2: 99%  Physical Exam  Constitutional: He is well-developed, well-nourished, and in no distress.  HENT:  Dried blood on nares  Cardiovascular: Normal rate and regular rhythm.   Musculoskeletal:       Lumbar back: He exhibits tenderness (left paraspinal). He exhibits no bony tenderness.  Neg SLR bilaterally  Nursing note and vitals reviewed.   Assessment & Plan:  Case discussed with Dr. Evette Doffing  Mild epistaxis HPI: had episdoe of epistaxsis  at work today was packed by nurse at work.  It now has resolved.  He does not usually get nose bleeds.  He takes a daily aspirin but no A/C.  He denies any picking of his nose.  A: Mild Epistaxis  P: No further treatment necessary - RTC if continues to have recurrent episodes, this one is likely related to winter weather/ less humidity.  Previous CBC in 2015 showed no thrombocytopenia but would repeat if still having nosebleeds.  Erectile dysfunction HPI: patient request refill of tadalifil. Reports it is working well and has had no issues.  A: Erectile dysfunction  P: refill tadalifil  Essential hypertension HPI: has only been taking HCTZ, Did not have amlodipine at the pharmacy  A: Essential Hypertension, not at goal  P: - It appears amlodipine  was prescribed as "no print", I refilled amlodpine 5mg  daily.  Continue HCTZ  Lumbar degenerative disc disease HPI: He reports >10 year hisotry of some low back pain.  He has had a couple of episdoes recently of worsened back pain and was seen in the ED twice in October and then November for low back pain.  Lumbar xrays at that time revealed mild to moderate DDD of the lumbar spine.  He was prescribed muscle relaxants, Ibuprofen, and then a course of prednisone.  He thinks these interventions helped previously but cannot remember how effective they were.  He currently reports some increased back pain for the last 3-4 days for which he has taken OTC Ibuprofen which provides mild relief.  He denies any muscle weakness, numbness, or tingling.  A: Lumbar degenerative disk disease  P: -Change NSAID to Naproxen 500mg  BID  Will have patient take scheduled for 1 week and follow up if pain not improved to change to alternative NSAID. - Flexeril 10mg  Q8PRN    Medications Ordered Meds ordered this encounter  Medications  . cyclobenzaprine (FLEXERIL) 10 MG tablet    Sig: Take 1 tablet (10 mg total) by mouth 3 (three) times daily as needed for muscle spasms.    Dispense:  30 tablet    Refill:  1  . naproxen (NAPROSYN) 500 MG tablet    Sig: Take 1 tablet (500 mg total) by mouth 2 (two) times daily with a meal.    Dispense:  60 tablet    Refill:  2  . amLODipine (NORVASC) 5 MG tablet    Sig: Take 1 tablet (5 mg total) by mouth daily.    Dispense:  30 tablet    Refill:  5  . tadalafil (CIALIS) 10 MG tablet    Sig: Take 1 tablet (10 mg total) by mouth as needed for erectile dysfunction.    Dispense:  30 tablet    Refill:  1   Other Orders No orders of the defined types were placed in this encounter.   Follow Up: Return in about 3 months (around 11/03/2015).

## 2015-08-05 NOTE — Assessment & Plan Note (Signed)
HPI: patient request refill of tadalifil. Reports it is working well and has had no issues.  A: Erectile dysfunction  P: refill tadalifil

## 2015-08-05 NOTE — Assessment & Plan Note (Addendum)
HPI: had episdoe of epistaxsis at work today was packed by nurse at work.  It now has resolved.  He does not usually get nose bleeds.  He takes a daily aspirin but no A/C.  He denies any picking of his nose.  A: Mild Epistaxis  P: No further treatment necessary - RTC if continues to have recurrent episodes, this one is likely related to winter weather/ less humidity.  Previous CBC in 2015 showed no thrombocytopenia but would repeat if still having nosebleeds.

## 2015-08-05 NOTE — Assessment & Plan Note (Signed)
HPI: He reports >10 year hisotry of some low back pain.  He has had a couple of episdoes recently of worsened back pain and was seen in the ED twice in October and then November for low back pain.  Lumbar xrays at that time revealed mild to moderate DDD of the lumbar spine.  He was prescribed muscle relaxants, Ibuprofen, and then a course of prednisone.  He thinks these interventions helped previously but cannot remember how effective they were.  He currently reports some increased back pain for the last 3-4 days for which he has taken OTC Ibuprofen which provides mild relief.  He denies any muscle weakness, numbness, or tingling.  A: Lumbar degenerative disk disease  P: -Change NSAID to Naproxen 500mg  BID  Will have patient take scheduled for 1 week and follow up if pain not improved to change to alternative NSAID. - Flexeril 10mg  Q8PRN

## 2015-08-05 NOTE — Patient Instructions (Signed)
General Instructions:  I want you to take Naproxen 500mg  twice a day for at least 1 week and let me know if this works for you.  If not please come back in and we will change the medication.  You can take Flexeril 10mg  every 8 hours as needed for back spasms.    Let me know if you continue to have nose bleeds  Please bring your medicines with you each time you come to clinic.  Medicines may include prescription medications, over-the-counter medications, herbal remedies, eye drops, vitamins, or other pills.   Progress Toward Treatment Goals:  Treatment Goal 08/05/2015  Blood pressure unchanged    Self Care Goals & Plans:  Self Care Goal 08/05/2015  Manage my medications take my medicines as prescribed; bring my medications to every visit  Monitor my health -  Eat healthy foods eat foods that are low in salt; eat baked foods instead of fried foods  Be physically active find an activity I enjoy    No flowsheet data found.   Care Management & Community Referrals:  Referral 08/05/2015  Referrals made for care management support none needed

## 2015-08-05 NOTE — Assessment & Plan Note (Signed)
HPI: has only been taking HCTZ, Did not have amlodipine at the pharmacy  A: Essential Hypertension, not at goal  P: - It appears amlodipine was prescribed as "no print", I refilled amlodpine 5mg  daily.  Continue HCTZ

## 2015-08-06 NOTE — Addendum Note (Signed)
Addended by: Lalla Brothers T on: 08/06/2015 01:48 PM   Modules accepted: Level of Service

## 2015-08-06 NOTE — Progress Notes (Signed)
Internal Medicine Clinic Attending  Case discussed with Dr. Hoffman at the time of the visit.  We reviewed the resident's history and exam and pertinent patient test results.  I agree with the assessment, diagnosis, and plan of care documented in the resident's note.  

## 2015-08-07 ENCOUNTER — Encounter: Payer: Self-pay | Admitting: Internal Medicine

## 2015-08-07 ENCOUNTER — Telehealth: Payer: Self-pay | Admitting: *Deleted

## 2015-08-07 ENCOUNTER — Ambulatory Visit (INDEPENDENT_AMBULATORY_CARE_PROVIDER_SITE_OTHER): Payer: Commercial Managed Care - HMO | Admitting: Internal Medicine

## 2015-08-07 VITALS — BP 138/94 | HR 80 | Temp 97.7°F | Wt 161.8 lb

## 2015-08-07 DIAGNOSIS — R040 Epistaxis: Secondary | ICD-10-CM

## 2015-08-07 DIAGNOSIS — Z8673 Personal history of transient ischemic attack (TIA), and cerebral infarction without residual deficits: Secondary | ICD-10-CM

## 2015-08-07 DIAGNOSIS — Z7982 Long term (current) use of aspirin: Secondary | ICD-10-CM | POA: Diagnosis not present

## 2015-08-07 LAB — CBC WITH DIFFERENTIAL/PLATELET
BASOS ABS: 0 10*3/uL (ref 0.0–0.1)
BASOS PCT: 1 %
Eosinophils Absolute: 0.1 10*3/uL (ref 0.0–0.7)
Eosinophils Relative: 3 %
HEMATOCRIT: 47.4 % (ref 39.0–52.0)
Hemoglobin: 15.8 g/dL (ref 13.0–17.0)
LYMPHS PCT: 39 %
Lymphs Abs: 1.6 10*3/uL (ref 0.7–4.0)
MCH: 29 pg (ref 26.0–34.0)
MCHC: 33.3 g/dL (ref 30.0–36.0)
MCV: 87 fL (ref 78.0–100.0)
Monocytes Absolute: 0.2 10*3/uL (ref 0.1–1.0)
Monocytes Relative: 6 %
NEUTROS ABS: 2.1 10*3/uL (ref 1.7–7.7)
Neutrophils Relative %: 51 %
PLATELETS: 275 10*3/uL (ref 150–400)
RBC: 5.45 MIL/uL (ref 4.22–5.81)
RDW: 12.9 % (ref 11.5–15.5)
WBC: 4.1 10*3/uL (ref 4.0–10.5)

## 2015-08-07 LAB — PROTIME-INR
INR: 1.1 (ref 0.00–1.49)
Prothrombin Time: 14.4 seconds (ref 11.6–15.2)

## 2015-08-07 LAB — APTT: APTT: 32 s (ref 24–37)

## 2015-08-07 NOTE — Patient Instructions (Signed)
General Instructions:  I will call with the rest of your blood results.  You can pick up over the counter Afrin nasal spray to use if you have another nose bleed.  If this keeps happening please call and we will refer you over the the [facility] Throat doctors.  Please bring your medicines with you each time you come to clinic.  Medicines may include prescription medications, over-the-counter medications, herbal remedies, eye drops, vitamins, or other pills.   Progress Toward Treatment Goals:  Treatment Goal 08/05/2015  Blood pressure unchanged    Self Care Goals & Plans:  Self Care Goal 08/07/2015  Manage my medications bring my medications to every visit; take my medicines as prescribed; refill my medications on time  Monitor my health keep track of my blood pressure  Eat healthy foods eat more vegetables; eat foods that are low in salt; eat baked foods instead of fried foods  Be physically active find an activity I enjoy    No flowsheet data found.   Care Management & Community Referrals:  Referral 08/05/2015  Referrals made for care management support none needed

## 2015-08-07 NOTE — Addendum Note (Signed)
Addended by: Joni Reining C on: 08/07/2015 01:24 PM   Modules accepted: Orders

## 2015-08-07 NOTE — Telephone Encounter (Signed)
Called [facility] re: nosebleeds, for appt today, pt will be seen as soon as he arrives by dr Constance Holster

## 2015-08-07 NOTE — Assessment & Plan Note (Addendum)
-  Second episdoe of epistaxis in 2 days. - Check CBC (plts) PT and PTT.  CBC has returned with normal plts. - Patient kept in office till nose bleed stopped. - Will follow up PT and PTT but doubt will be abnormal. - Will have patient continue ASA given his stroke history. - Recommended Afrin nasal spray if happens again - Patient instructed to call if he continues to have recurrent episodes and I will refer him over the ENT for further evaluation.  ADDENDUM - PT and PTT have returned normal. Patient called during lunch time and notes his nose has started bleeding again, would like referral to ENT.  Since his nose is still bleeding will have nursing call over to see if he can be seen today.

## 2015-08-07 NOTE — Progress Notes (Addendum)
[facility] Subjective:   Patient ID: [name] male   DOB: 1955-11-28 60 y.o.   MRN: [id number]  HPI: [name] is a 60 y.o. male with a PMH detailed below returns for nose bleed. Patient was seen two days ago for a nose bleed.  He reports that he did well that day and the next however this morning when he got to work he blew his nose and it started bleeding again.  He has had no other bleeding.  This is only his second episdoe of nose bleed. He denies any intranasal cocaine use.  No trauma to the nose.  No history of easy bleeding or bruising.  He takes Aspirin. He has taken only one dose of the naproxen I have prescribed for him.  The nose bleed started arount 830am and was still mildly bleeding at 9:20.    Past Medical History  Diagnosis Date  . Hypertension   . 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 ETOH abuse     quit  . Cocaine abuse     quit  . Hyperlipidemia    Current Outpatient Prescriptions  Medication Sig Dispense Refill  . amLODipine (NORVASC) 5 MG tablet Take 1 tablet (5 mg total) by mouth daily. 30 tablet 5  . aspirin (EQ ASPIRIN ADULT LOW DOSE) 81 MG EC tablet TAKE ONE  BY MOUTH EVERY DAY 30 tablet 11  . cyclobenzaprine (FLEXERIL) 10 MG tablet Take 1 tablet (10 mg total) by mouth 3 (three) times daily as needed for muscle spasms. 30 tablet 1  . hydrochlorothiazide (HYDRODIURIL) 25 MG tablet Take 1 tablet (25 mg total) by mouth daily. 30 tablet 5  . naproxen (NAPROSYN) 500 MG tablet Take 1 tablet (500 mg total) by mouth 2 (two) times daily with a meal. 60 tablet 2  . pravastatin (PRAVACHOL) 20 MG tablet Take 1 tablet (20 mg total) by mouth daily. 30 tablet 5  . tadalafil (CIALIS) 10 MG tablet Take 1 tablet (10 mg total) by mouth as needed for erectile dysfunction. 30 tablet 1   No current facility-administered medications for this visit.   Family History  Problem Relation Age of Onset  . Colon cancer  Neg Hx   . Esophageal cancer Neg Hx   . Rectal cancer Neg Hx   . Stomach cancer Neg Hx    Social History   Social History  . Marital Status: Single    Spouse Name: N/A  . Number of Children: N/A  . Years of Education: N/A   Social History Main Topics  . Smoking status: Former Smoker    Quit date: 07/18/2012  . Smokeless tobacco: Never Used  . Alcohol Use: No     Comment: quit 2011  . Drug Use: No     Comment: h/o cocaine abuse  . Sexual Activity: Not Asked   Other Topics Concern  . None   Social History Narrative   Review of Systems: Review of Systems  Constitutional: Negative for fever, chills, weight loss and malaise/fatigue.  HENT: Positive for nosebleeds. Negative for congestion and sore throat.   Eyes: Negative for blurred vision.  Gastrointestinal: Negative for blood in stool and melena.  Genitourinary: Negative for hematuria.  Musculoskeletal: Negative for joint pain.  Neurological: Negative for headaches.     Objective:  Physical Exam: Filed Vitals:   08/07/15 0926  BP: 138/94  Pulse: 80  Temp: 97.7 F (36.5 C)  TempSrc: Oral  Weight: 161 lb 12.8 oz (73.392 kg)  SpO2: 98%   Physical Exam  Constitutional: He is well-developed, well-nourished, and in no distress.  HENT:  Nose: Mucosal edema present. Epistaxis (patint using klenex ) is observed.  No foreign bodies.  Mouth/Throat: Oropharynx is clear and moist.  Nursing note and vitals reviewed.    Assessment & Plan:  Case discussed with Dr. Evette Doffing  Mild epistaxis -Second episdoe of epistaxis in 2 days. - Check CBC (plts) PT and PTT.  CBC has returned with normal plts. - Patient kept in office till nose bleed stopped. - Will follow up PT and PTT but doubt will be abnormal. - Will have patient continue ASA given his stroke history. - Recommended Afrin nasal spray if happens again - Patient instructed to call if he continues to have recurrent episodes and I will refer him over the ENT for further  evaluation.  ADDENDUM - PT and PTT have returned normal. Patient called during lunch time and notes his nose has started bleeding again, would like referral to ENT.  Since his nose is still bleeding will have nursing call over to see if he can be seen today.    Medications Ordered No orders of the defined types were placed in this encounter.   Other Orders Orders Placed This Encounter  Procedures  . Protime-INR  . APTT  . CBC with Diff  . Ambulatory referral to ENT    Referral Priority:  Routine    Referral Type:  Consultation    Referral Reason:  Specialty Services Required    Requested Specialty:  Otolaryngology    Number of Visits Requested:  1   Follow Up: Return if symptoms worsen or fail to improve.

## 2015-08-10 NOTE — Addendum Note (Signed)
Addended by: Lalla Brothers T on: 08/10/2015 03:33 PM   Modules accepted: Level of Service

## 2015-08-10 NOTE — Progress Notes (Signed)
Internal Medicine Clinic Attending  Case discussed with Dr. Hoffman at the time of the visit.  We reviewed the resident's history and exam and pertinent patient test results.  I agree with the assessment, diagnosis, and plan of care documented in the resident's note.  

## 2015-08-28 ENCOUNTER — Encounter: Payer: Commercial Managed Care - HMO | Admitting: Internal Medicine

## 2015-08-28 ENCOUNTER — Telehealth: Payer: Self-pay | Admitting: *Deleted

## 2015-08-28 NOTE — Telephone Encounter (Signed)
Sounds good

## 2015-08-28 NOTE — Telephone Encounter (Signed)
Ivin Booty, [place] ent called and stated she spoke to pt and she thinks he does not need to be seen today, she states he had a little bleed 2/9 and then he had 2 drops on a tissue today, she has paged dr Janace Hoard and [facility] and will let them decide for sure Ivin Booty calls back and states pt will be seen this pm by dr Janace Hoard and then seen Tuesday for surgery schedule

## 2015-08-31 ENCOUNTER — Encounter: Payer: Self-pay | Admitting: Internal Medicine

## 2015-08-31 ENCOUNTER — Ambulatory Visit (INDEPENDENT_AMBULATORY_CARE_PROVIDER_SITE_OTHER): Payer: Commercial Managed Care - HMO | Admitting: Internal Medicine

## 2015-08-31 VITALS — BP 127/91 | HR 81 | Temp 97.8°F | Ht 68.0 in | Wt 161.1 lb

## 2015-08-31 DIAGNOSIS — I10 Essential (primary) hypertension: Secondary | ICD-10-CM | POA: Diagnosis not present

## 2015-08-31 DIAGNOSIS — Z0000 Encounter for general adult medical examination without abnormal findings: Secondary | ICD-10-CM

## 2015-08-31 NOTE — Progress Notes (Signed)
Subjective:   Patient ID: [name] male   DOB: [birth year] 60 y.o.   MRN: [id number]  HPI: [name] is a 60 y.o. male w/ PMHx of HTN, HLD, and h/o CVA, presents to the clinic today for a follow-up visit regarding his blood pressure. Patient has had some recurrent epistaxis over the past several months, however, he had a nose bleed at work last week and saw the NP associated with his employment who found his BP to be 161/100. He was started on Norvasc 5 mg daily in addition to his HCTZ 25 mg daily, however, it looks as if this was already the intention according to previous clinic visit notes. He also saw ENT and had cautery of his nose bleed and he has not had trouble since.   Today, he has no complaints. Feels well, BP well controlled, systolic in the [id number].   Past Medical History  Diagnosis Date  . Hypertension   . Stroke [facility])     posterior limb, R internal capsule stroke 07/2012  . ETOH abuse     quit  . Cocaine abuse     quit  . Hyperlipidemia    Current Outpatient Prescriptions  Medication Sig Dispense Refill  . amLODipine (NORVASC) 5 MG tablet Take 1 tablet (5 mg total) by mouth daily. 30 tablet 5  . aspirin (EQ ASPIRIN ADULT LOW DOSE) 81 MG EC tablet TAKE ONE  BY MOUTH EVERY DAY 30 tablet 11  . cyclobenzaprine (FLEXERIL) 10 MG tablet Take 1 tablet (10 mg total) by mouth 3 (three) times daily as needed for muscle spasms. 30 tablet 1  . hydrochlorothiazide (HYDRODIURIL) 25 MG tablet Take 1 tablet (25 mg total) by mouth daily. 30 tablet 5  . naproxen (NAPROSYN) 500 MG tablet Take 1 tablet (500 mg total) by mouth 2 (two) times daily with a meal. 60 tablet 2  . pravastatin (PRAVACHOL) 20 MG tablet Take 1 tablet (20 mg total) by mouth daily. 30 tablet 5  . tadalafil (CIALIS) 10 MG tablet Take 1 tablet (10 mg total) by mouth as needed for erectile dysfunction. 30 tablet 1   No current facility-administered medications for this visit.    Review of Systems: General:  Denies fever, chills, diaphoresis, appetite change and fatigue.  Respiratory: Denies SOB, DOE, cough, and wheezing.   Cardiovascular: Denies chest pain and palpitations.  Gastrointestinal: Denies nausea, vomiting, abdominal pain, and diarrhea.  Genitourinary: Denies dysuria, increased frequency, and flank pain. Endocrine: Denies hot or cold intolerance, polyuria, and polydipsia. Musculoskeletal: Denies myalgias, back pain, joint swelling, arthralgias and gait problem.  Skin: Denies pallor, rash and wounds.  Neurological: Denies dizziness, seizures, syncope, weakness, lightheadedness, numbness and headaches.  Psychiatric/Behavioral: Denies mood changes, and sleep disturbances.  Objective:   Physical Exam: Filed Vitals:   08/31/15 1438  BP: 127/91  Pulse: 81  Temp: 97.8 F (36.6 C)  TempSrc: Oral  Height: 5\' 8"  (1.727 m)  Weight: 161 lb 1.6 oz (73.074 kg)  SpO2: 100%    General: Alert, cooperative, NAD. HEENT: PERRL, EOMI. Moist mucus membranes Neck: Full range of motion without pain, supple, no lymphadenopathy or carotid bruits Lungs: Clear to ascultation bilaterally, normal work of respiration, no wheezes, rales, rhonchi Heart: RRR, no murmurs, gallops, or rubs Abdomen: Soft, non-tender, non-distended, BS + Extremities: No cyanosis, clubbing, or edema Neurologic: Alert & oriented X3, cranial nerves II-XII intact, strength grossly intact, sensation intact to light touch   Assessment & Plan:   Please see  problem based assessment and plan.

## 2015-08-31 NOTE — Patient Instructions (Signed)
1. Please make a follow up appointment for 3 months.   2. Please take all medications as previously prescribed with the following changes:  Continue HCTZ 25 mg daily  Continue taking Norvasc (amlodipine) 5 mg daily for improved blood pressure control.   3. If you have worsening of your symptoms or new symptoms arise, please call the clinic [phone]), or go to the ER immediately if symptoms are severe.  You have done a great job in taking all your medications. Please continue to do this.

## 2015-08-31 NOTE — Assessment & Plan Note (Signed)
BP Readings from Last 3 Encounters:  08/31/15 127/91  08/07/15 138/94  08/05/15 141/94    Lab Results  Component Value Date   NA 139 04/22/2015   K 4.0 04/22/2015   CREATININE 0.73* 04/22/2015    Assessment: Blood pressure control:  Controlled Progress toward BP goal:   At goal Comments: Previously on HCTZ 25 mg daily. Started on Norvasc 5 mg daily by employer NP  Plan: Medications:  continue current medications. HCTZ 25 mg daily + Norvasc 5 mg daily. May need to increase Norvasc if elevated at next visit.  Educational resources provided: brochure (denied ) Self management tools provided: home blood pressure logbook Other plans: RTC in 3 months

## 2015-08-31 NOTE — Assessment & Plan Note (Signed)
Had flu shot this year with employer

## 2015-09-03 NOTE — Progress Notes (Signed)
Internal Medicine Clinic Attending  Case discussed with Dr. Jones at the time of the visit.  We reviewed the resident's history and exam and pertinent patient test results.  I agree with the assessment, diagnosis, and plan of care documented in the resident's note.  

## 2015-10-01 ENCOUNTER — Ambulatory Visit: Payer: Commercial Managed Care - HMO | Admitting: Internal Medicine

## 2015-10-12 ENCOUNTER — Encounter: Payer: Self-pay | Admitting: *Deleted

## 2015-10-29 ENCOUNTER — Encounter: Payer: Commercial Managed Care - HMO | Admitting: Internal Medicine

## 2015-11-19 ENCOUNTER — Encounter: Payer: Self-pay | Admitting: Internal Medicine

## 2015-11-19 ENCOUNTER — Ambulatory Visit (INDEPENDENT_AMBULATORY_CARE_PROVIDER_SITE_OTHER): Payer: Commercial Managed Care - HMO | Admitting: Internal Medicine

## 2015-11-19 ENCOUNTER — Encounter: Payer: Commercial Managed Care - HMO | Admitting: Internal Medicine

## 2015-11-19 VITALS — BP 129/91 | HR 72 | Temp 97.7°F | Ht 68.0 in | Wt 165.7 lb

## 2015-11-19 DIAGNOSIS — I10 Essential (primary) hypertension: Secondary | ICD-10-CM | POA: Diagnosis not present

## 2015-11-19 DIAGNOSIS — K120 Recurrent oral aphthae: Secondary | ICD-10-CM | POA: Insufficient documentation

## 2015-11-19 DIAGNOSIS — M6588 Other synovitis and tenosynovitis, other site: Secondary | ICD-10-CM | POA: Diagnosis not present

## 2015-11-19 DIAGNOSIS — M779 Enthesopathy, unspecified: Principal | ICD-10-CM

## 2015-11-19 DIAGNOSIS — E785 Hyperlipidemia, unspecified: Secondary | ICD-10-CM

## 2015-11-19 DIAGNOSIS — N529 Male erectile dysfunction, unspecified: Secondary | ICD-10-CM

## 2015-11-19 DIAGNOSIS — M778 Other enthesopathies, not elsewhere classified: Secondary | ICD-10-CM | POA: Insufficient documentation

## 2015-11-19 MED ORDER — ASPIRIN 81 MG PO TBEC: DELAYED_RELEASE_TABLET | ORAL | Status: DC

## 2015-11-19 MED ORDER — PRAVASTATIN SODIUM 20 MG PO TABS: 20.0000 mg | ORAL_TABLET | Freq: Every day | ORAL | Status: DC

## 2015-11-19 MED ORDER — BENZOCAINE 10 % MT GEL: 1.0000 "application " | OROMUCOSAL | Status: DC | PRN

## 2015-11-19 MED ORDER — DICLOFENAC SODIUM 1 % TD GEL: 4.0000 g | Freq: Four times a day (QID) | TRANSDERMAL | Status: DC

## 2015-11-19 MED ORDER — AMLODIPINE BESYLATE 5 MG PO TABS: 5.0000 mg | ORAL_TABLET | Freq: Every day | ORAL | Status: DC

## 2015-11-19 MED ORDER — HYDROCHLOROTHIAZIDE 25 MG PO TABS
25.0000 mg | ORAL_TABLET | Freq: Every day | ORAL | Status: DC
Start: 2015-11-19 — End: 2016-06-10

## 2015-11-19 MED ORDER — TADALAFIL 10 MG PO TABS: 10.0000 mg | ORAL_TABLET | ORAL | Status: DC | PRN

## 2015-11-19 NOTE — Patient Instructions (Signed)
[name],  It was a joy seeing you again today.  For your mouth ulcer, please use the ORAGEL. You can buy this over the counter at the pharmacy.  For your left foot pain. This is from overuse and from having heavy shoes. Please wear lighter shoes if you can. I have showed you some exercises you can do to improve the pain. I am prescribing voltaren gel.  Your are in overall excellent health.

## 2015-11-19 NOTE — Progress Notes (Signed)
   Subjective:    Patient ID: [name], male    DOB: [birth year], 60 y.o.   MRN: [id number]  HPI  [name] is a 60 year old gentleman with a history of a lacunar stroke, HTN who comes to the clinic to discuss a pain he's had on the top of his left foot, a sore in his mouth, and management of his chronic medical problems (ED, HTN). For the past month, he's felt a pain on the dorsum of his left foot. It can be mildly painful at the beginning of the day, but it is worse by the end of the day. 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 He also complains of a sore on the inside of his bottom lip that has been bothering him for a few days. He has been putting chapstick on it, which has not been effective. Cialis for his ED continues to work, and he has been adherent with his blood pressure and cholesterol medications.   Active Ambulatory Problems    Diagnosis Date Noted  . Essential hypertension 05/31/2007  . Acute lacunar stroke ([place]) 08/02/2012  . Health care maintenance 05/14/2014  . Erectile dysfunction 04/22/2015  . Dyslipidemia 04/22/2015  . Mild epistaxis 08/05/2015  . Lumbar degenerative disc disease 08/05/2015  . Aphthous ulcer 11/19/2015  . Extensor tendinitis of foot 11/19/2015    Review of Systems  Constitutional: Negative for fever and fatigue.  HENT: Negative for congestion and sore throat.   Eyes: Negative for redness and itching.  Respiratory: Negative for cough and shortness of breath.   Cardiovascular: Negative for chest pain and palpitations.  Gastrointestinal: Negative for abdominal pain and constipation.  Endocrine: Negative for polydipsia and polyuria.  Genitourinary: Negative for urgency and frequency.  Musculoskeletal: Positive for arthralgias. Negative for back pain.  Skin: Negative for rash and wound.  Neurological: Negative for dizziness and headaches.  Psychiatric/Behavioral: Negative for  dysphoric mood. The patient is not nervous/anxious.        Objective:   Physical Exam  Constitutional: He appears well-developed and well-nourished.  HENT:  Mouth/Throat: No oropharyngeal exudate.  Supeficial 1 cm ulceration on inside of mouth.  Eyes: Conjunctivae are normal. No scleral icterus.  Neck: Neck supple.  Cardiovascular: Normal rate, regular rhythm and normal heart sounds.   Pulmonary/Chest: Effort normal and breath sounds normal. No stridor. No respiratory distress. He has no wheezes.  Abdominal: Soft. Bowel sounds are normal. He exhibits no distension. There is no tenderness.  Musculoskeletal: He exhibits no edema.  TTP of dorsum of foot that is reproduced with dorsiflexion.  Neurological: He is alert. He has normal reflexes.  Skin: Skin is dry.  Psychiatric: He has a normal mood and affect. His behavior is normal.  Vitals reviewed.         Assessment & Plan:   Please see problem based assessment and plan for details.

## 2015-11-21 NOTE — Assessment & Plan Note (Signed)
>>  ASSESSMENT AND PLAN FOR DYSLIPIDEMIA WRITTEN ON 11/21/2015  4:08 PM BY PRIMUS DITCH, MD  A: LDL mildly elevated at 105 in 04/2015. Pt tolerating statin therapy and has been eating a well balanced diet  P: Refill Pravastatin .

## 2015-11-21 NOTE — Assessment & Plan Note (Signed)
A: This likely represent an overuse injury from wearing heavy steel toed boots.  leading to an extensor tendinopathy.   P: Advised to wear light tennis shoes until pain resolved Voltaren gel

## 2015-11-21 NOTE — Assessment & Plan Note (Signed)
A: BP roughly at goal at 127/91.   P: Continue HCTZ 25 mg and Amlodipine 5 mg daily

## 2015-11-21 NOTE — Assessment & Plan Note (Signed)
A: LDL mildly elevated at 105 in 04/2015. Pt tolerating statin therapy and has been eating a well balanced diet  P: Refill Pravastatin.

## 2015-11-21 NOTE — Assessment & Plan Note (Signed)
A: Counseled patient that Aphthous Ulcers should start to heal in a week with no intervention.  P: Oragel for symptomatic management

## 2015-11-21 NOTE — Assessment & Plan Note (Signed)
A: Tadafil has been effective for ED  P: Refill Tadafil

## 2015-11-23 NOTE — Progress Notes (Signed)
Internal Medicine Clinic Attending  Case discussed with Dr. Ford at the time of the visit.  We reviewed the resident's history and exam and pertinent patient test results.  I agree with the assessment, diagnosis, and plan of care documented in the resident's note.  

## 2016-01-11 ENCOUNTER — Encounter: Payer: Self-pay | Admitting: Internal Medicine

## 2016-01-11 ENCOUNTER — Ambulatory Visit: Payer: Commercial Managed Care - HMO | Admitting: Internal Medicine

## 2016-01-12 ENCOUNTER — Encounter: Payer: Self-pay | Admitting: *Deleted

## 2016-05-14 ENCOUNTER — Ambulatory Visit (HOSPITAL_COMMUNITY)
Admission: EM | Admit: 2016-05-14 | Discharge: 2016-05-14 | Disposition: A | Discharge status: Home / Self Care | Payer: Commercial Managed Care - HMO | Attending: Internal Medicine | Admitting: Internal Medicine

## 2016-05-14 ENCOUNTER — Encounter (HOSPITAL_COMMUNITY): Payer: Self-pay | Admitting: *Deleted

## 2016-05-14 DIAGNOSIS — B001 Herpesviral vesicular dermatitis: Secondary | ICD-10-CM | POA: Diagnosis not present

## 2016-05-14 MED ORDER — VALACYCLOVIR HCL 1 G PO TABS: 1000.0000 mg | ORAL_TABLET | Freq: Three times a day (TID) | ORAL | 0 refills | Status: DC

## 2016-05-14 MED ORDER — VALACYCLOVIR HCL 1 G PO TABS: 1000.0000 mg | ORAL_TABLET | Freq: Three times a day (TID) | ORAL | 0 refills | Status: AC

## 2016-05-14 NOTE — ED Provider Notes (Signed)
CSN: [id number]     Arrival date & time 05/14/16  1201 History   None    Chief Complaint  Patient presents with  . Rash   (Consider location/radiation/quality/duration/timing/severity/associated sxs/prior Treatment) HPI NP 60 Y/O MALE WITH HEALING COLD SORE NARE OF RIGHT NOSTRIL. PRESENT FOR 1 WEEK. HAS BEEN USING AN OTC MEDICATION. NO PAIN AT THIS TIME.  Past Medical History:  Diagnosis Date  . Cocaine abuse    quit  . ETOH abuse    quit  . Hyperlipidemia   . Hypertension   . Stroke [facility])    posterior limb, R internal capsule stroke 07/2012   Past Surgical History:  Procedure Laterality Date  . HAND SURGERY     Family History  Problem Relation Age of Onset  . Colon cancer Neg Hx   . Esophageal cancer Neg Hx   . Rectal cancer Neg Hx   . Stomach cancer Neg Hx    Social History  Substance Use Topics  . Smoking status: Former Smoker    Quit date: 07/18/2012  . Smokeless tobacco: Never Used  . Alcohol use No     Comment: quit 2011    Review of Systems  Denies: HEADACHE, NAUSEA, ABDOMINAL PAIN, CHEST PAIN, CONGESTION, DYSURIA, SHORTNESS OF BREATH  Allergies  Ace inhibitors and Penicillins  Home Medications   Prior to Admission medications   Medication Sig Start Date End Date Taking? Authorizing Provider  amLODipine (NORVASC) 5 MG tablet Take 1 tablet (5 mg total) by mouth daily. 11/19/15   Liberty Handy, MD  aspirin (EQ ASPIRIN ADULT LOW DOSE) 81 MG EC tablet TAKE ONE  BY MOUTH EVERY DAY 11/19/15   Liberty Handy, MD  benzocaine (ORAJEL) 10 % mucosal gel Use as directed 1 application in the mouth or throat as needed for mouth pain. 11/19/15   Liberty Handy, MD  diclofenac sodium (VOLTAREN) 1 % GEL Apply 4 g topically 4 (four) times daily. 11/19/15   Liberty Handy, MD  hydrochlorothiazide (HYDRODIURIL) 25 MG tablet Take 1 tablet (25 mg total) by mouth daily. 11/19/15   Liberty Handy, MD  pravastatin (PRAVACHOL) 20 MG tablet Take 1 tablet (20 mg total) by mouth daily. 11/19/15   Liberty Handy,  MD  tadalafil (CIALIS) 10 MG tablet Take 1 tablet (10 mg total) by mouth as needed for erectile dysfunction. 11/19/15 11/18/16  Liberty Handy, MD  valACYclovir (VALTREX) 1000 MG tablet Take 1 tablet (1,000 mg total) by mouth 3 (three) times daily. 05/14/16 05/28/16  Konrad Felix, PA   Meds Ordered and Administered this Visit  Medications - No data to display  BP 143/99 (BP Location: Left Arm)   Pulse 76   Temp 98 F (36.7 C) (Oral)   Resp 12   SpO2 96%  No data found.   Physical Exam NURSES NOTES AND VITAL SIGNS REVIEWED. CONSTITUTIONAL: Well developed, well nourished, no acute distress HEENT: normocephalic, atraumatic DRY HEALING HERPES LESION, NO SIGNS OF INFECTION.  EYES: Conjunctiva normal NECK:normal ROM, supple, no adenopathy PULMONARY:No respiratory distress, normal effort ABDOMINAL: Soft, ND, NT BS+, No CVAT MUSCULOSKELETAL: Normal ROM of all extremities,  SKIN: warm and dry without rash PSYCHIATRIC: Mood and affect, behavior are normal  Urgent Care Course   Clinical Course    Procedures (including critical care time)  Labs Review Labs Reviewed - No data to display  Imaging Review No results found.   Visual Acuity Review  Right Eye Distance:   Left Eye Distance:   Bilateral Distance:  Right Eye Near:   Left Eye Near:    Bilateral Near:      DISCUSSION WITH PATIENT THAT THERE IS NO INDICATION THAT MEDICATION WILL HELP AT THIS JUNCTURE. THE LESION IS ALMOST HEALED.  SUGGEST CONTINUED USE OF OTC MEDS. PT WOULD LIKE RX FOR POSSIBLE FUTURE OUTBREAK.   MDM   1. Cold sore     Patient is reassured that there are no issues that require transfer to higher level of care at this time or additional tests. Patient is advised to continue home symptomatic treatment. Patient is advised that if there are new or worsening symptoms to attend the emergency department, contact primary care provider, or return to UC. Instructions of care provided discharged home in  stable condition.    THIS NOTE WAS GENERATED USING A VOICE RECOGNITION SOFTWARE PROGRAM. ALL REASONABLE EFFORTS  WERE MADE TO PROOFREAD THIS DOCUMENT FOR ACCURACY.  I have verbally reviewed the discharge instructions with the patient. A printed AVS was given to the patient.  All questions were answered prior to discharge.      Konrad Felix, PA 05/14/16 [phone]

## 2016-05-14 NOTE — ED Triage Notes (Signed)
Pt  Has  A  Small  Lesion  Below  The  Nose      Started    About  1  Week   No  Pain

## 2016-06-10 ENCOUNTER — Other Ambulatory Visit: Payer: Self-pay | Admitting: Internal Medicine

## 2016-06-10 DIAGNOSIS — I10 Essential (primary) hypertension: Secondary | ICD-10-CM

## 2016-06-10 DIAGNOSIS — E785 Hyperlipidemia, unspecified: Secondary | ICD-10-CM

## 2016-07-19 ENCOUNTER — Encounter (INDEPENDENT_AMBULATORY_CARE_PROVIDER_SITE_OTHER): Payer: Self-pay

## 2016-07-19 ENCOUNTER — Encounter: Payer: Self-pay | Admitting: Internal Medicine

## 2016-07-19 ENCOUNTER — Ambulatory Visit (INDEPENDENT_AMBULATORY_CARE_PROVIDER_SITE_OTHER): Payer: Commercial Managed Care - HMO | Admitting: Internal Medicine

## 2016-07-19 DIAGNOSIS — N529 Male erectile dysfunction, unspecified: Secondary | ICD-10-CM | POA: Diagnosis not present

## 2016-07-19 DIAGNOSIS — Z79899 Other long term (current) drug therapy: Secondary | ICD-10-CM

## 2016-07-19 DIAGNOSIS — E785 Hyperlipidemia, unspecified: Secondary | ICD-10-CM | POA: Diagnosis not present

## 2016-07-19 DIAGNOSIS — Z0000 Encounter for general adult medical examination without abnormal findings: Secondary | ICD-10-CM

## 2016-07-19 DIAGNOSIS — I10 Essential (primary) hypertension: Secondary | ICD-10-CM | POA: Diagnosis not present

## 2016-07-19 DIAGNOSIS — Z86010 Personal history of colonic polyps: Secondary | ICD-10-CM | POA: Diagnosis not present

## 2016-07-19 DIAGNOSIS — Z860101 Personal history of adenomatous and serrated colon polyps: Secondary | ICD-10-CM | POA: Insufficient documentation

## 2016-07-19 DIAGNOSIS — Z87891 Personal history of nicotine dependence: Secondary | ICD-10-CM

## 2016-07-19 DIAGNOSIS — Z8673 Personal history of transient ischemic attack (TIA), and cerebral infarction without residual deficits: Secondary | ICD-10-CM

## 2016-07-19 MED ORDER — HYDROCHLOROTHIAZIDE 25 MG PO TABS: 25.0000 mg | ORAL_TABLET | Freq: Every day | ORAL | 3 refills | Status: DC

## 2016-07-19 MED ORDER — ATORVASTATIN CALCIUM 40 MG PO TABS: 40.0000 mg | ORAL_TABLET | Freq: Every day | ORAL | 11 refills | Status: DC

## 2016-07-19 MED ORDER — PRAVASTATIN SODIUM 20 MG PO TABS: 20.0000 mg | ORAL_TABLET | Freq: Every day | ORAL | 3 refills | Status: DC

## 2016-07-19 MED ORDER — TADALAFIL 10 MG PO TABS: 10.0000 mg | ORAL_TABLET | ORAL | 6 refills | Status: DC | PRN

## 2016-07-19 MED ORDER — PRAVASTATIN SODIUM 80 MG PO TABS: 20.0000 mg | ORAL_TABLET | Freq: Every day | ORAL | 0 refills | Status: DC

## 2016-07-19 MED ORDER — AMLODIPINE BESYLATE 5 MG PO TABS: 5.0000 mg | ORAL_TABLET | Freq: Every day | ORAL | 3 refills | Status: DC

## 2016-07-19 NOTE — Progress Notes (Signed)
   CC: follow up of hypertension   HPI: [name] is a 61 y.o. with past medical history as outlined below who presents to clinic for follow up of hypertension, hyperlipidemia, erectile dysfunction, and preventative healthcare. He denies chest pain, difficulty breathing, abdominal pain, blood in stool, constipation, weight loss, or night sweats. Denies any new concerns or complaints.   Please see problem list for status of the pt's chronic medical problems.  Past Medical History:  Diagnosis Date  . Cocaine abuse    quit  . ETOH abuse    quit  . Hyperlipidemia   . Hypertension   . Stroke [facility])    posterior limb, R internal capsule stroke 07/2012    Review of Systems:  Please see each problem below for a pertinent review of systems.  Physical Exam:  Vitals:   07/19/16 1610  BP: 115/86  Pulse: 72  Temp: 97.6 F (36.4 C)  TempSrc: Oral  SpO2: 100%  Weight: 168 lb 3.2 oz (76.3 kg)  Height: 5\' 8"  (1.727 m)   Physical Exam  Constitutional: He is oriented to person, place, and time. He appears well-developed and well-nourished. No distress.  HENT:  Head: Normocephalic and atraumatic.  Eyes: Conjunctivae are normal. No scleral icterus.  Cardiovascular: Normal rate and regular rhythm.   No murmur heard. No peripheral edema   Pulmonary/Chest: Effort normal and breath sounds normal. No respiratory distress. He has no wheezes. He has no rales.  Abdominal: Soft. Bowel sounds are normal. He exhibits no distension. There is no tenderness. There is no guarding.  Musculoskeletal: He exhibits no edema.  Neurological: He is alert and oriented to person, place, and time.  Skin: Skin is warm and dry. He is not diaphoretic. No pallor.  Psychiatric: He has a normal mood and affect. His behavior is normal.    Assessment & Plan:   See Encounters Tab for problem based charting.  Patient discussed with Dr. Lynnae January

## 2016-07-19 NOTE — Assessment & Plan Note (Signed)
>>  ASSESSMENT AND PLAN FOR DYSLIPIDEMIA WRITTEN ON 08/15/2016  8:02 PM BY BLUM, NINA, MD  A: Hx of ischemic stroke so he would benefit from moderate-high intensity statin therapy. Has had good compliance with pravastatin  20 mg qd. Explained the risk of muscle pain and he is hesitant to increase the statin dose but agree to increase to high intensity level and will notify the clinic if he experiences muscle pain.   - prescribed atorvastatin  40 mg per day  - discontinue pravastatin  20 mg qd   Addendum: Patient called in and describes gastric reflux which he attributes to taking atorvastatin , will d/c atorvastatin  and resume pravastatin  at 40 mg per day with plans to taper to a high intensity statin dose.

## 2016-07-19 NOTE — Assessment & Plan Note (Addendum)
A: BP is at goal 115/86 today. Reports good compliance with HCTZ and amlodipine and these medications seem to be working great to control his BP.    P: Refilled HCTZ 25 mg qd and amlodipine 5 mg qd  Ordered BMP

## 2016-07-19 NOTE — Patient Instructions (Addendum)
It was a pleasure to meet you today Mr. Moga!   For your high cholesterol-  START taking atorvastatin 40 mg per day  STOP taking pravastatin 20 mg per day  Please call the clinic if this causes you to have muscle pain and we will decrease your dose   Schedule a follow up appointment to see me in 1 year

## 2016-07-19 NOTE — Assessment & Plan Note (Addendum)
A: Hx of ischemic stroke so he would benefit from moderate-high intensity statin therapy. Has had good compliance with pravastatin 20 mg qd. Explained the risk of muscle pain and he is hesitant to increase the statin dose but agree to increase to high intensity level and will notify the clinic if he experiences muscle pain.   - prescribed atorvastatin 40 mg per day  - discontinue pravastatin 20 mg qd   Addendum: Patient called in and describes gastric reflux which he attributes to taking atorvastatin, will d/c atorvastatin and resume pravastatin at 40 mg per day with plans to taper to a high intensity statin dose.

## 2016-07-19 NOTE — Assessment & Plan Note (Addendum)
2014 colonoscopy showed 3 mm adenomatous polyp needing follow up in 5 years. Denies weight loss, night sweats, or changes in stool.   - follow up colonoscopy in 2019 -continue to monitor

## 2016-07-19 NOTE — Assessment & Plan Note (Addendum)
Had flu shot this year  Believes that he had shingles vaccination at [place] previously, will check with [facility] and mail him an Rx if they do not have a record of this.  Colonoscopy 2014 - 3 mm precancerous adenomatous polyp, with recommended checkup in 5 years. Denies weight loss, night sweats, or changes in bowel habits.   - repeat colonoscopy in 2019  -yearly BMP today

## 2016-07-19 NOTE — Assessment & Plan Note (Signed)
A: States taldalifil is working. No new concerns or complaints.   P: refilled taldalifil

## 2016-07-19 NOTE — Assessment & Plan Note (Signed)
>>  ASSESSMENT AND PLAN FOR HISTORY OF COLONIC POLYPS WRITTEN ON 07/19/2016  5:36 PM BY BLUM, NINA, MD  2014 colonoscopy showed 3 mm adenomatous polyp needing follow up in 5 years. Denies weight loss, night sweats, or changes in stool.   - follow up colonoscopy in 2019 -continue to monitor

## 2016-07-20 LAB — BMP8+ANION GAP
ANION GAP: 16 mmol/L (ref 10.0–18.0)
BUN/Creatinine Ratio: 30 — ABNORMAL HIGH (ref 10–24)
BUN: 21 mg/dL (ref 8–27)
CALCIUM: 9.3 mg/dL (ref 8.6–10.2)
CO2: 27 mmol/L (ref 18–29)
CREATININE: 0.71 mg/dL — AB (ref 0.76–1.27)
Chloride: 96 mmol/L (ref 96–106)
GFR calc Af Amer: 117 mL/min/{1.73_m2} (ref >59)
GFR, EST NON AFRICAN AMERICAN: 101 mL/min/{1.73_m2} (ref >59)
Glucose: 101 mg/dL — ABNORMAL HIGH (ref 65–99)
Potassium: 3.7 mmol/L (ref 3.5–5.2)
Sodium: 139 mmol/L (ref 134–144)

## 2016-07-22 NOTE — Progress Notes (Signed)
Internal Medicine Clinic Attending  Case discussed with Dr. Blum at the time of the visit.  We reviewed the resident's history and exam and pertinent patient test results.  I agree with the assessment, diagnosis, and plan of care documented in the resident's note. 

## 2016-08-15 ENCOUNTER — Telehealth: Payer: Self-pay

## 2016-08-15 MED ORDER — PRAVASTATIN SODIUM 40 MG PO TABS: 40.0000 mg | ORAL_TABLET | Freq: Every evening | ORAL | 11 refills | Status: DC

## 2016-08-15 NOTE — Telephone Encounter (Signed)
Okay, he should stop taking the atorvastatin, I have switched his prescription back to pravastatin and increased the dose to 40 mg. If his symptoms continue he should stop taking the medication and can schedule an appointment to be seen in clinic. Would you mind communicating this to [name]?

## 2016-08-15 NOTE — Telephone Encounter (Signed)
Needs to speak with a nurse regarding meds.  

## 2016-08-15 NOTE — Telephone Encounter (Signed)
Pt calls and states since his change of pravastatin to atorvastatin he has started having really bad gastric reflux, he states he cant deal with this discomfort.  Please advise

## 2016-08-15 NOTE — Addendum Note (Signed)
Addended by: Meryl Dare on: 08/15/2016 08:03 PM   Modules accepted: Orders

## 2016-08-16 NOTE — Telephone Encounter (Signed)
Message relayed to pt, he verbalizes understanding

## 2016-10-02 ENCOUNTER — Ambulatory Visit (HOSPITAL_COMMUNITY)
Admission: EM | Admit: 2016-10-02 | Discharge: 2016-10-02 | Disposition: A | Discharge status: Home / Self Care | Payer: Commercial Managed Care - HMO | Attending: Family Medicine | Admitting: Family Medicine

## 2016-10-02 ENCOUNTER — Encounter (HOSPITAL_COMMUNITY): Payer: Self-pay | Admitting: *Deleted

## 2016-10-02 DIAGNOSIS — M79671 Pain in right foot: Secondary | ICD-10-CM

## 2016-10-02 MED ORDER — PREDNISONE 10 MG (21) PO TBPK: ORAL_TABLET | Freq: Every day | ORAL | 0 refills | Status: DC

## 2016-10-02 NOTE — ED Provider Notes (Signed)
CSN: [id number]     Arrival date & time 10/02/16  1322 History   None    Chief Complaint  Patient presents with  . Foot Pain   (Consider location/radiation/quality/duration/timing/severity/associated sxs/prior Treatment) 61 year old male presents with chief complaint of pain at the base of the second toe on the right foot for 4 days. No known history of gout, no fever, shortness of breath, nausea, or symptoms indicating underlying systemic infection   The history is provided by the patient.  Foot Pain  This is a new problem. The current episode started more than 2 days ago. The problem occurs constantly. The problem has not changed since onset.Associated symptoms comments: None. The symptoms are aggravated by walking and standing. The symptoms are relieved by position and rest. He has tried rest for the symptoms. The treatment provided mild relief.    Past Medical History:  Diagnosis Date  . Cocaine abuse    quit  . ETOH abuse    quit  . Hyperlipidemia   . Hypertension   . Stroke [facility])    posterior limb, R internal capsule stroke 07/2012   Past Surgical History:  Procedure Laterality Date  . HAND SURGERY     Family History  Problem Relation Age of Onset  . Colon cancer Neg Hx   . Esophageal cancer Neg Hx   . Rectal cancer Neg Hx   . Stomach cancer Neg Hx    Social History  Substance Use Topics  . Smoking status: Former Smoker    Quit date: 07/18/2012  . Smokeless tobacco: Never Used  . Alcohol use No     Comment: quit 2011    Review of Systems  Reason unable to perform ROS: as covered in HPI.  All other systems reviewed and are negative.   Allergies  Ace inhibitors; Penicillins; and Penicillin g  Home Medications   Prior to Admission medications   Medication Sig Start Date End Date Taking? Authorizing Provider  amLODipine (NORVASC) 5 MG tablet Take 1 tablet (5 mg total) by mouth daily. 07/19/16   Ledell Noss, MD  aspirin (EQ ASPIRIN ADULT LOW DOSE) 81 MG EC tablet  TAKE ONE  BY MOUTH EVERY DAY 11/19/15   Liberty Handy, MD  hydrochlorothiazide (HYDRODIURIL) 25 MG tablet Take 1 tablet (25 mg total) by mouth daily. 07/19/16   Ledell Noss, MD  pravastatin (PRAVACHOL) 40 MG tablet Take 1 tablet (40 mg total) by mouth every evening. 08/15/16 08/15/17  Ledell Noss, MD  predniSONE (STERAPRED UNI-PAK 21 TAB) 10 MG (21) TBPK tablet Take by mouth daily. Take 6 tabs by mouth daily  for 2 days, then 5 tabs for 2 days, then 4 tabs for 2 days, then 3 tabs for 2 days, 2 tabs for 2 days, then 1 tab by mouth daily for 2 days 10/02/16   Barnet Glasgow, NP  tadalafil (CIALIS) 10 MG tablet Take 1 tablet (10 mg total) by mouth as needed for erectile dysfunction. 07/19/16 07/19/17  Ledell Noss, MD   Meds Ordered and Administered this Visit  Medications - No data to display  BP 138/78 (BP Location: Right Arm)   Pulse 78   Temp 98.6 F (37 C) (Oral)   Resp 18   SpO2 100%  No data found.   Physical Exam  Constitutional: He is oriented to person, place, and time. He appears well-developed and well-nourished. No distress.  HENT:  Head: Normocephalic and atraumatic.  Right Ear: External ear normal.  Left Ear: External ear normal.  Musculoskeletal: He exhibits tenderness. He exhibits no edema.       Feet:  Neurological: He is alert and oriented to person, place, and time.  Skin: Skin is warm and dry. Capillary refill takes less than 2 seconds. No rash noted. He is not diaphoretic. No erythema.  Psychiatric: He has a normal mood and affect. His behavior is normal.  Nursing note and vitals reviewed.   Urgent Care Course     Procedures (including critical care time)  Labs Review Labs Reviewed - No data to display  Imaging Review No results found.    MDM   1. Right foot pain    Patient given a course of steroids for inflammation,  Advised RICE, and OTC analgesics, follow up with PCP or orthopedics if pain persists.     Barnet Glasgow, NP 10/02/16 1414

## 2016-10-02 NOTE — Discharge Instructions (Signed)
I'm treating you for inflammation at the base of your second toe on the right foot. I prescribed a long course of steroids, called prednisone, take 6 tablets today, then decrease by one tablet every other day until finished. (6, 6, 5, 5, 4, 4, 3, 3, 2, 2, 1, 1.) I also recommend rest, apply ice to the affected foot 15 minutes at a time 4 times a day, and elevation. Should your pain persist, follow up with her primary care provider or an orthopedist.

## 2016-10-02 NOTE — ED Triage Notes (Signed)
Pt  Reports  Pain in the  Toes  Of   His  r  Foot  For  Several  Days  He  Denies   Any  Injury  He  Is  Alert  And  Oriented  The  Pain is  Worse  On  Weight  Bearing

## 2016-11-28 ENCOUNTER — Ambulatory Visit (INDEPENDENT_AMBULATORY_CARE_PROVIDER_SITE_OTHER): Payer: Commercial Managed Care - HMO | Admitting: Internal Medicine

## 2016-11-28 VITALS — BP 127/81 | HR 77 | Temp 97.9°F | Ht 68.0 in | Wt 159.4 lb

## 2016-11-28 DIAGNOSIS — G729 Myopathy, unspecified: Secondary | ICD-10-CM

## 2016-11-28 NOTE — Progress Notes (Signed)
Internal Medicine Clinic Attending  Case discussed with Dr. Taylor at the time of the visit.  We reviewed the resident's history and exam and pertinent patient test results.  I agree with the assessment, diagnosis, and plan of care documented in the resident's note. 

## 2016-11-28 NOTE — Patient Instructions (Signed)
It was a pleasure seeing you today. Thank you for choosing [facility] for your healthcare needs.   Today we will do basic blood work. I will call you with the results. Please use Tylenol as needed for pain. I think your symptoms should improve over the next week. If you continue to have soreness in your muscles over the next week or this worsens please call the clinic.   Myopathy Myopathy is a condition that causes muscles to be weak and dysfunctional. There are many different kinds of myopathies. Myopathies may be passed from parent to child (inherited), or they may be caused by external factors (acquired). Inherited myopathies may cause symptoms at birth or in early life. Acquired myopathies may start suddenly at any age. There is no cure for most myopathies. What are the causes? Causes of myopathy include:  Endocrine disorders, such as thyroid disease.  Metabolic disorders, which are usually inherited.  Infection or inflammation of the muscles. This is often triggered by viruses or because the body's defense system (immune system) is attacking the muscles.  Certain medicines, such as lipid-lowering medicines. In some cases, the cause is not known. What increases the risk? This condition is more likely to develop in:  People who have a family history of myopathy.  Women. What are the signs or symptoms? Symptoms of myopathy can range from mild to severe. The most common symptoms include muscle weakness, cramps, stiffness, and spasms. These symptoms are usually felt close to the center of the body (proximal). Depending upon the type of myopathy, one muscle group may be more affected than others. In inherited myopathies, symptoms vary among family members. Other symptoms of myopathy include:  Muscle pain or tenderness.  Muscle weakness that gets progressively worse.  Fatigue.  Heart problems.  Trouble breathing.  Trouble swallowing. How is this diagnosed? This condition is  diagnosed based on your medical history and tests, which may include:  Blood tests.  Removal of a small piece of muscle tissue to be tested (biopsy).  Electromyogram (EMG).  MRI.  Electrocardiogram (ECG).  Genetic testing. How is this treated? Treatment varies depending on the type of myopathy. Treatment may include over-the-counter or prescription medicines, such as disease-modifying antirheumatic drugs (DMARDs) or drugs that suppress the immune system. You may need physical therapy, a brace to stabilize your muscles, or surgery. Follow these instructions at home: If you have a brace:   Wear the brace as told by your health care provider. Remove it only as told by your health care provider.  Loosen the brace if any part of your body tingles, becomes numb, or turns cold and blue.  Do not let your brace get wet if it is not waterproof. If it is not waterproof, cover it with a watertight plastic bag when you take a bath or a shower.  Keep the brace clean.  Ask your health care provider when it is safe to drive if you are wearing a brace. General instructions   Take over-the-counter and prescription medicines only as told by your health care provider.  Maintain a healthy weight. Follow instructions from your health care provider about eating, drinking, and physical activity.  If physical therapy is prescribed, do exercises as told by your health care provider or physical therapist.  Keep all follow-up visits as told by your health care provider. This is important. Contact a health care provider if:  You have trouble managing your symptoms at home.  You have a fever. Get help right away  if:  You develop breathing problems.  You develop chest pain. This information is not intended to replace advice given to you by your health care provider. Make sure you discuss any questions you have with your health care provider. Document Released: 06/24/2002 Document Revised: 12/10/2015  Document Reviewed: 01/14/2015 Elsevier Interactive Patient Education  2017 [employer].

## 2016-11-28 NOTE — Assessment & Plan Note (Signed)
The patient presents with a 72 hour history of diffuse myalgia. He states he woke up Saturday morning and felt like all of his muscles were sore as if he had the flu. This persisted throughout Saturday and all day Sunday. Sunday evening he noticed several spots on his for head and became concerned. Outside of myalgias the patient denies joint pains. He denies muscle weakness. He denies nausea, vomiting and abdominal pain. He denies fever or chills. He denies chest pain or shortness of breath. He denies joint pains. He has no other rash outside of what is on his face. He has not had any new medications. He has not eaten any new foods. He denies polyuria or genital lesions. He has not been exposed to tick. The area on his face looks like several small well circumscribed well-healing shallow lesions. No surrounding erythema or evidence of infection. The exact cause of his symptoms is unclear although the differential diagnosis includes acute viral myositis or statin induced myopathy given that the patient is on pravastatin. For now I will order inflammatory markers including ESR, CRP and CK. I will also order a basic metabolic panel. For pain management  I advised the patient to take over-the-counter Tylenol. He was amenable to this plan. Overall, I think the most likely etiology of the patient's complaint would be a viral induced myositis or myopathy and should improve over the next week with supportive care. -- ESR, CRP, CK -- BMP -- Acetaminophen when necessary pain -- Supportive care

## 2016-11-28 NOTE — Progress Notes (Signed)
   CC: Myalgias and rash HPI: Mr. RAD GRAMLING is a 61 y.o. male with a h/o of cocaine abuse, alcohol abuse, hyperlipidemia, hypertension and prior stroke who presents with myalgias and rash  Review of Systems: Denies fevers or chills. Denies nausea and vomiting. Denies chest pain or shortness of breath. Denies joint pains or swelling.  Physical Exam: Vitals:   11/28/16 0836  BP: 127/81  Pulse: 77  Temp: 97.9 F (36.6 C)  TempSrc: Oral  SpO2: 100%  Weight: 159 lb 6.4 oz (72.3 kg)  Height: 5\' 8"  (1.727 m)   General appearance: alert and appears stated age Head: Normocephalic, without obvious abnormality, atraumatic, Several crusted, round scabbed lesions scattered on the patient's for head Lungs: clear to auscultation bilaterally Heart: regular rate and rhythm, S1, S2 normal, no murmur, click, rub or gallop Abdomen: soft, non-tender; bowel sounds normal; no masses,  no organomegaly Extremities: extremities normal, atraumatic, no cyanosis or edema  Assessment & Plan:  See encounters tab for problem based medical decision making. Patient discussed with Dr. Dareen Piano  Signed: Ophelia Shoulder, MD 11/28/2016, 8:49 AM  Pager: [phone]

## 2016-11-29 LAB — BMP8+ANION GAP
Anion Gap: 18 mmol/L (ref 10.0–18.0)
BUN / CREAT RATIO: 15 (ref 10–24)
BUN: 14 mg/dL (ref 8–27)
CALCIUM: 9.3 mg/dL (ref 8.6–10.2)
CHLORIDE: 96 mmol/L (ref 96–106)
CO2: 26 mmol/L (ref 18–29)
Creatinine, Ser: 0.92 mg/dL (ref 0.76–1.27)
GFR, EST AFRICAN AMERICAN: 103 mL/min/{1.73_m2} (ref >59)
GFR, EST NON AFRICAN AMERICAN: 89 mL/min/{1.73_m2} (ref >59)
GLUCOSE: 84 mg/dL (ref 65–99)
POTASSIUM: 3.3 mmol/L — AB (ref 3.5–5.2)
SODIUM: 140 mmol/L (ref 134–144)

## 2016-11-29 LAB — CK: Total CK: 150 U/L (ref 24–204)

## 2016-11-29 LAB — C-REACTIVE PROTEIN: CRP: 6.9 mg/L — AB (ref 0.0–4.9)

## 2016-11-29 LAB — SEDIMENTATION RATE: SED RATE: 3 mm/h (ref 0–30)

## 2016-12-01 ENCOUNTER — Telehealth: Payer: Self-pay | Admitting: Internal Medicine

## 2016-12-01 NOTE — Telephone Encounter (Signed)
I have attempted to call the patient to discuss his lab results but I have been unable to reach him. Voicemail left on most recent attempt.

## 2016-12-02 ENCOUNTER — Telehealth: Payer: Self-pay | Admitting: Internal Medicine

## 2016-12-02 NOTE — Telephone Encounter (Signed)
Called patient and discussed lab work. Told him if he was not improved by Monday to return to clinic for f/u. He endorsed understanding. Discussed his mildly elevated CRP. Told him this should be repeated at next visit.

## 2017-07-24 ENCOUNTER — Encounter: Payer: Self-pay | Admitting: Internal Medicine

## 2017-07-24 ENCOUNTER — Encounter (INDEPENDENT_AMBULATORY_CARE_PROVIDER_SITE_OTHER): Payer: Self-pay

## 2017-07-24 ENCOUNTER — Ambulatory Visit: Payer: 59 | Admitting: Internal Medicine

## 2017-07-24 VITALS — BP 135/79 | HR 80 | Temp 97.6°F | Wt 165.0 lb

## 2017-07-24 DIAGNOSIS — N529 Male erectile dysfunction, unspecified: Secondary | ICD-10-CM | POA: Diagnosis not present

## 2017-07-24 DIAGNOSIS — Z7982 Long term (current) use of aspirin: Secondary | ICD-10-CM

## 2017-07-24 DIAGNOSIS — E785 Hyperlipidemia, unspecified: Secondary | ICD-10-CM | POA: Diagnosis not present

## 2017-07-24 DIAGNOSIS — I10 Essential (primary) hypertension: Secondary | ICD-10-CM | POA: Diagnosis not present

## 2017-07-24 DIAGNOSIS — Z8673 Personal history of transient ischemic attack (TIA), and cerebral infarction without residual deficits: Secondary | ICD-10-CM | POA: Diagnosis not present

## 2017-07-24 DIAGNOSIS — Z79899 Other long term (current) drug therapy: Secondary | ICD-10-CM

## 2017-07-24 DIAGNOSIS — Z87891 Personal history of nicotine dependence: Secondary | ICD-10-CM

## 2017-07-24 DIAGNOSIS — Z0000 Encounter for general adult medical examination without abnormal findings: Secondary | ICD-10-CM

## 2017-07-24 MED ORDER — TADALAFIL 10 MG PO TABS: 10.0000 mg | ORAL_TABLET | ORAL | 6 refills | Status: DC | PRN

## 2017-07-24 MED ORDER — AMLODIPINE BESYLATE 5 MG PO TABS: 5.0000 mg | ORAL_TABLET | Freq: Every day | ORAL | 3 refills | Status: DC

## 2017-07-24 MED ORDER — HYDROCHLOROTHIAZIDE 25 MG PO TABS: 25.0000 mg | ORAL_TABLET | Freq: Every day | ORAL | 3 refills | Status: DC

## 2017-07-24 MED ORDER — ROSUVASTATIN CALCIUM 20 MG PO TABS: 20.0000 mg | ORAL_TABLET | Freq: Every day | ORAL | 11 refills | Status: DC

## 2017-07-24 NOTE — Patient Instructions (Addendum)
It was a pleasure to see you [name].  Your blood pressure looks good today. I have refilled your Amlodipine 5 mg once daily and Hydrochlorothiazide (HCTZ) 25 mg once daily.  We will restart a cholesterol medication called Rosuvastatin (Crestor) to help reduce the risk of you having another stroke in the future. Please continue your low dose Aspirin daily.  I have refilled your Cialis.  Please follow up with [name] again in about 6 months or sooner if needed.  Rosuvastatin Tablets What is this medicine? ROSUVASTATIN (roe SOO va sta tin) is known as a HMG-CoA reductase inhibitor or 'statin'. It lowers cholesterol and triglycerides in the blood. This drug may also reduce the risk of heart attack, stroke, or other health problems in patients with risk factors for heart disease. Diet and lifestyle changes are often used with this drug. This medicine may be used for other purposes; ask your health care provider or pharmacist if you have questions. COMMON BRAND NAME(S): Crestor What should I tell my health care provider before I take this medicine? They need to know if you have any of these conditions: -frequently drink alcoholic beverages -kidney disease -liver disease -muscle aches or weakness -other medical condition -an unusual or allergic reaction to rosuvastatin, other medicines, foods, dyes, or preservatives -pregnant or trying to get pregnant -breast-feeding How should I use this medicine? Take this medicine by mouth with a glass of water. Follow the directions on the prescription label. Do not cut, crush or chew this medicine. You can take this medicine with or without food. Take your doses at regular intervals. Do not take your medicine more often than directed. Talk to your pediatrician regarding the use of this medicine in children. While this drug may be prescribed for children as young as 26 years old for selected conditions, precautions do apply. Overdosage: If you think you have  taken too much of this medicine contact a poison control center or emergency room at once. NOTE: This medicine is only for you. Do not share this medicine with others. What if I miss a dose? If you miss a dose, take it as soon as you can. Do not take 2 doses within 12 hours of each other. If there are less than 12 hours until your next dose, take only that dose. Do not take double or extra doses. What may interact with this medicine? Do not take this medicine with any of the following medications: -herbal medicines like red yeast rice This medicine may also interact with the following medications: -alcohol -antacids containing aluminum hydroxide or magnesium hydroxide -cyclosporine -other medicines for high cholesterol -some medicines for HIV infection -warfarin This list may not describe all possible interactions. Give your health care provider a list of all the medicines, herbs, non-prescription drugs, or dietary supplements you use. Also tell them if you smoke, drink alcohol, or use illegal drugs. Some items may interact with your medicine. What should I watch for while using this medicine? Visit your doctor or health care professional for regular check-ups. You may need regular tests to make sure your liver is working properly. Tell your doctor or health care professional right away if you get any unexplained muscle pain, tenderness, or weakness, especially if you also have a fever and tiredness. Your doctor or health care professional may tell you to stop taking this medicine if you develop muscle problems. If your muscle problems do not go away after stopping this medicine, contact your health care professional. This medicine may  affect blood sugar levels. If you have diabetes, check with your doctor or health care professional before you change your diet or the dose of your diabetic medicine. Avoid taking antacids containing aluminum, calcium or magnesium within 2 hours of taking this  medicine. This drug is only part of a total heart-health program. Your doctor or a dietician can suggest a low-cholesterol and low-fat diet to help. Avoid alcohol and smoking, and keep a proper exercise schedule. Do not use this drug if you are pregnant or breast-feeding. Serious side effects to an unborn child or to an infant are possible. Talk to your doctor or pharmacist for more information. What side effects may I notice from receiving this medicine? Side effects that you should report to your doctor or health care professional as soon as possible: -allergic reactions like skin rash, itching or hives, swelling of the face, lips, or tongue -dark urine -fever -joint pain -muscle cramps, pain -redness, blistering, peeling or loosening of the skin, including inside the mouth -trouble passing urine or change in the amount of urine -unusually weak or tired -yellowing of the eyes or skin Side effects that usually do not require medical attention (report to your doctor or health care professional if they continue or are bothersome): -constipation -heartburn -nausea -stomach gas, pain, upset This list may not describe all possible side effects. Call your doctor for medical advice about side effects. You may report side effects to FDA at 1-800-FDA-1088. Where should I keep my medicine? Keep out of the reach of children. Store at room temperature between 20 and 25 degrees C (68 and 77 degrees F). Keep container tightly closed (protect from moisture). Throw away any unused medicine after the expiration date. NOTE: This sheet is a summary. It may not cover all possible information. If you have questions about this medicine, talk to your doctor, pharmacist, or health care provider.  2018 Elsevier/Gold Standard (2014-12-18 13:33:08)

## 2017-07-24 NOTE — Progress Notes (Signed)
CC: HTN  HPI:  [name] is a 62 y.o. male with PMH of CVA, HTN, HLD, Erectile Dysfunction, and former substance use who presents for follow up management of his HTN and for refill of medications. Please see problem based charting for status of patient's chronic medical issues.  Essential hypertension He has been taking Amlodipine 5 mg daily and HCTZ 25 mg daily. He reports adherence until he ran out about 2 weeks ago. His BP this visit is 135/79. BP Readings from Last 3 Encounters:  07/24/17 135/79  11/28/16 127/81  10/02/16 138/78   A/P: His BP is slightly elevated in the setting of running out of his medications. Will refill and continue Amlodipine 5 mg daily and HCTZ 25 mg daily. Repeat BMET on follow up.  History of CVA (cerebrovascular accident) He has a history of CVA in January 2014 at which time was found to have an acute lacunar type infarct in the posterior limb of the right internal capsule. He reports no residual deficits. He is taking a low dose aspirin daily. He was previously taking Pravastatin daily and was switched to Atorvastatin but did not tolerate it (reflux symptoms). He was to switch back to Pravastatin but did not do so. He works for the city of [employer] work and has no difficulty completing his work.  A/P: Patient has a history of CVA and would benefit from continuing daily low dose Aspirin and starting a high-intensity statin. He is agreeable to start Rosuvastatin 20 mg daily. - Continue Aspirin 81 mg daily - Start Rosuvastatin 20 mg daily - if not tolerating can switch back to Pravastatin which he did tolerate - He is congratulated on quitting smoking, alcohol, recreational drug use several years ago and advised to continue with cessation  Erectile dysfunction He has been intermittently using Tadalafil as needed for erectile dysfunction which he reports is working. He denies any lightheadedness, dizziness, or chest  pain.  A/P: Tadalafil has been effective for his erectile dysfunction. Refilled Tadalafil.  Health care maintenance He reports receiving the flu shot through work several months ago.    Past Medical History:  Diagnosis Date  . Cocaine abuse ([place])    quit  . ETOH abuse    quit  . Hyperlipidemia   . Hypertension   . Stroke [facility])    posterior limb, R internal capsule stroke 07/2012   Review of Systems:   Review of Systems  Constitutional: Negative for chills, diaphoresis and fever.  Respiratory: Negative for cough and shortness of breath.   Cardiovascular: Negative for chest pain, palpitations and leg swelling.  Genitourinary: Negative for dysuria.  Musculoskeletal: Negative for falls.  Neurological: Negative for loss of consciousness.     Physical Exam:  Vitals:   07/24/17 1501  BP: 135/79  Pulse: 80  Temp: 97.6 F (36.4 C)  TempSrc: Oral  SpO2: 96%  Weight: 165 lb (74.8 kg)   Physical Exam  Constitutional: He is oriented to person, place, and time. He appears well-developed and well-nourished. No distress.  HENT:  Head: Normocephalic and atraumatic.  Cardiovascular: Normal rate and regular rhythm.  No murmur heard. Pulmonary/Chest: Effort normal. No respiratory distress. He has no wheezes. He has no rales.  Musculoskeletal: He exhibits no edema or tenderness.  Neurological: He is alert and oriented to person, place, and time. He has normal strength.  Skin: Skin is warm. He is not diaphoretic.    Assessment & Plan:   See Encounters Tab for problem  based charting.  Patient discussed with Dr. Dareen Piano

## 2017-07-24 NOTE — Assessment & Plan Note (Signed)
He has been intermittently using Tadalafil as needed for erectile dysfunction which he reports is working. He denies any lightheadedness, dizziness, or chest pain.  A/P: Tadalafil has been effective for his erectile dysfunction. Refilled Tadalafil.

## 2017-07-24 NOTE — Assessment & Plan Note (Signed)
He reports receiving the flu shot through work several months ago.

## 2017-07-24 NOTE — Assessment & Plan Note (Addendum)
He has a history of CVA in January 2014 at which time was found to have an acute lacunar type infarct in the posterior limb of the right internal capsule. He reports no residual deficits. He is taking a low dose aspirin daily. He was previously taking Pravastatin daily and was switched to Atorvastatin but did not tolerate it (reflux symptoms). He was to switch back to Pravastatin but did not do so. He works for the city of [employer] work and has no difficulty completing his work.  A/P: Patient has a history of CVA and would benefit from continuing daily low dose Aspirin and starting a high-intensity statin. He is agreeable to start Rosuvastatin 20 mg daily. - Continue Aspirin 81 mg daily - Start Rosuvastatin 20 mg daily - if not tolerating can switch back to Pravastatin which he did tolerate - He is congratulated on quitting smoking, alcohol, recreational drug use several years ago and advised to continue with cessation

## 2017-07-24 NOTE — Assessment & Plan Note (Signed)
He has been taking Amlodipine 5 mg daily and HCTZ 25 mg daily. He reports adherence until he ran out about 2 weeks ago. His BP this visit is 135/79. BP Readings from Last 3 Encounters:  07/24/17 135/79  11/28/16 127/81  10/02/16 138/78   A/P: His BP is slightly elevated in the setting of running out of his medications. Will refill and continue Amlodipine 5 mg daily and HCTZ 25 mg daily. Repeat BMET on follow up.

## 2017-07-25 NOTE — Progress Notes (Signed)
Internal Medicine Clinic Attending  Case discussed with Dr. Patel at the time of the visit.  We reviewed the resident's history and exam and pertinent patient test results.  I agree with the assessment, diagnosis, and plan of care documented in the resident's note.  

## 2017-10-11 ENCOUNTER — Other Ambulatory Visit: Payer: Self-pay | Admitting: Internal Medicine

## 2017-10-11 DIAGNOSIS — I10 Essential (primary) hypertension: Secondary | ICD-10-CM

## 2017-10-11 NOTE — Telephone Encounter (Signed)
Patient said amlodipine prescription is expired, pls contact [facility] on [address]

## 2017-10-12 ENCOUNTER — Encounter: Payer: Self-pay | Admitting: Internal Medicine

## 2017-10-12 MED ORDER — AMLODIPINE BESYLATE 5 MG PO TABS: 5.0000 mg | ORAL_TABLET | Freq: Every day | ORAL | 1 refills | Status: DC

## 2017-11-13 ENCOUNTER — Telehealth: Payer: Self-pay | Admitting: Internal Medicine

## 2017-11-13 NOTE — Telephone Encounter (Signed)
Patient is requesting refills on amlodipine 5mg  tablet, he already got an emergency supply from the pharmacy. Pls send to [facility] [address]

## 2017-11-13 NOTE — Telephone Encounter (Signed)
Spoke to [facility], pt has refills on all his meds, they have not issued an emergency refill on any meds, attempted to speak w/ pt, vmail id'ed as pt, left an explanation of discussion w/ [facility] and went over each med and when and how many refills were given, ask him to call back for questions

## 2017-11-14 NOTE — Telephone Encounter (Signed)
Received faxed refill request from [facility] [facility] for amlodipine 5mg  tabs.  Attempted to contact pt to confirm that he had picked up rx from [facility] (see previous note) no answer, HIPPA compliant message left on recorder for return call.Despina Hidden Cassady4/30/20191:02 PM

## 2017-12-12 ENCOUNTER — Ambulatory Visit: Payer: 59 | Admitting: Internal Medicine

## 2017-12-12 ENCOUNTER — Other Ambulatory Visit: Payer: Self-pay

## 2017-12-12 ENCOUNTER — Encounter: Payer: Self-pay | Admitting: Internal Medicine

## 2017-12-12 VITALS — BP 144/92 | HR 87 | Temp 97.6°F | Ht 68.0 in | Wt 164.2 lb

## 2017-12-12 DIAGNOSIS — S90425A Blister (nonthermal), left lesser toe(s), initial encounter: Secondary | ICD-10-CM | POA: Insufficient documentation

## 2017-12-12 DIAGNOSIS — X58XXXA Exposure to other specified factors, initial encounter: Secondary | ICD-10-CM | POA: Diagnosis not present

## 2017-12-12 MED ORDER — MUPIROCIN 2 % EX OINT: 1.0000 "application " | TOPICAL_OINTMENT | Freq: Two times a day (BID) | CUTANEOUS | 0 refills | Status: DC

## 2017-12-12 NOTE — Patient Instructions (Signed)
You have a blister on the left great toe that does not seem actively infected at this time.  This is usually a self-limited problem I recommend treatment with topical Bactroban or other equivalent antibiotic ointment and covering the affected area with an adhesive bandage to prevent reinjury from friction with sock or shoe.  This should be closed within 5 days and can stop the antibiotic ointment after that.  Call us back if this becomes red, more swollen, or more painful as these could be evidence of an abscess forming that might need a definitive drainage and treatment.

## 2017-12-12 NOTE — Progress Notes (Signed)
   CC: Draining foot lesion  HPI:  [name] is a 62 y.o. male with PMHx detailed below presenting with a spot on the dorsum of his left great toe that he noticed about 4 days ago.  He was concerned for a blister versus infection so punctured this with a heated pin expressing clear fluid.  He has since been applying a topical antibiotic from the pharmacy but is concerned that this has not resolved after several days.  He denies any associated erythema or pain around the site.  He denies any significant previous history of foot infections or blisters.  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  See problem based assessment and plan below for additional details.  Blister of toe of left foot without infection This toe has the appearance of a lanced blister with scant clear drainage and no active signs of infection.  It will likely improve on its own with conservative management at this time. Plan: Apply topical Bactroban ointment twice daily for 5 days Recommended covering with adhesive bandage while wearing socks and/or shoes   Past Medical History:  Diagnosis Date  . Cocaine abuse ([place])    quit  . ETOH abuse    quit  . Hyperlipidemia   . Hypertension   . Stroke [facility])    posterior limb, R internal capsule stroke 07/2012    Review of Systems: Review of Systems  Constitutional: Negative for fever.  Musculoskeletal: Negative for joint pain.  Skin: Negative for itching and rash.  Neurological: Negative for sensory change.     Physical Exam: Vitals:   12/12/17 1454  BP: (!) 144/92  Pulse: 87  Temp: 97.6 F (36.4 C)  TempSrc: Oral  SpO2: 97%  Weight: 164 lb 3.2 oz (74.5 kg)  Height: 5\' 8"  (1.727 m)   GENERAL- alert, co-operative, NAD CARDIAC- RRR, no murmurs, rubs or gallops. NEURO- Sensation intact globally EXTREMITIES- pulse 2+, symmetric, no pedal edema (image below)   SKIN- Warm, dry, erythema or  warmth   Assessment & Plan:   See encounters tab for problem based medical decision making.   Patient discussed with Dr. Dareen Piano

## 2017-12-12 NOTE — Assessment & Plan Note (Signed)
This toe has the appearance of a lanced blister with scant clear drainage and no active signs of infection.  It will likely improve on its own with conservative management at this time. Plan: Apply topical Bactroban ointment twice daily for 5 days Recommended covering with adhesive bandage while wearing socks and/or shoes

## 2017-12-13 ENCOUNTER — Ambulatory Visit: Payer: 59

## 2017-12-14 NOTE — Progress Notes (Signed)
Internal Medicine Clinic Attending  Case discussed with Dr. Rice at the time of the visit.  We reviewed the resident's history and exam and pertinent patient test results.  I agree with the assessment, diagnosis, and plan of care documented in the resident's note.  

## 2017-12-15 ENCOUNTER — Other Ambulatory Visit: Payer: Self-pay

## 2017-12-15 ENCOUNTER — Ambulatory Visit: Payer: 59 | Admitting: Internal Medicine

## 2017-12-15 ENCOUNTER — Encounter: Payer: Self-pay | Admitting: Internal Medicine

## 2017-12-15 VITALS — BP 132/83 | HR 76 | Temp 98.0°F | Ht 68.0 in | Wt 162.1 lb

## 2017-12-15 DIAGNOSIS — S90425A Blister (nonthermal), left lesser toe(s), initial encounter: Secondary | ICD-10-CM

## 2017-12-15 DIAGNOSIS — L739 Follicular disorder, unspecified: Secondary | ICD-10-CM | POA: Diagnosis not present

## 2017-12-15 MED ORDER — CEPHALEXIN 250 MG PO CAPS: 250.0000 mg | ORAL_CAPSULE | Freq: Four times a day (QID) | ORAL | 0 refills | Status: DC

## 2017-12-15 NOTE — Patient Instructions (Addendum)
FOLLOW-UP INSTRUCTIONS When: if symptoms worsen or fail to improve  Thank you for your visit to the [facility] today.   For your folliculitis I have prescribed an oral antibiotic called cephalexin four times daily for four days. It is important that you complete this treatment course.    Continue the mupirocin ointment for your nose as previously prescribed.  Please complete a bleach bath as below: Add 1/4 cup to 1/2 cup of bleach to a 40-gallon bathtub filled with warm water. Soak from the neck down or just the affected areas of skin for about 10 minutes. Rinse if your skin doesn't tolerate the bleach bath well.  Gently pat dry with a towel. Immediately apply moisturizer generously. Take a bleach bath no more than three times a week.

## 2017-12-15 NOTE — Progress Notes (Signed)
   CC: rash  HPI:[name] is a 62 y.o. male who presents today for evaluation of a rash.  Folliculitis: There are at least 9 small follicular lesions consistent with infection of the hair follicle. They are pustular, consisting of a 2-101mm erythematous base and rupture once they have developed. There are associated with mild pruritis.  They are distributed throughout the belt line at the waist, inner thighs, axilla, and pretibial area consistent with high friction areas, most closely resembling folliculitis.  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  He denies fever, chills, nausea, vomiting, diarrhea, constipation, muscle aches, visual changes, headaches or other concerning symptoms today.  There is no evidence of oral or genital lesions. I am not convinced that these are MRSA but will treat the skin and nares prophylactically for MRSA and the current lesions actively for MSSA. I believe the bleach baths will also assist with decreasing the rate of recurrence of the lesions.   Plan: Bleach baths as described in the AVS. Continue the mupirocin ointment previously prescribed, patient stated that he was not using this for his toe but will now use it for his nares.  Cephalexin 250mg  QID for his current infection.   Past Medical History:  Diagnosis Date  . Cocaine abuse ([place])    quit  . ETOH abuse    quit  . Hyperlipidemia   . Hypertension   . Stroke [facility])    posterior limb, R internal capsule stroke 07/2012   Review of Systems:  ROS negative except as per HPI.  Physical Exam:  Vitals:   12/15/17 0956  BP: 132/83  Pulse: 76  Temp: 98 F (36.7 C)  TempSrc: Oral  SpO2: 95%  Weight: 162 lb 1.6 oz (73.5 kg)  Height: 5\' 8"  (1.727 m)   Physical Exam  Constitutional: He is oriented to person, place, and time. He appears well-developed and well-nourished. No distress.    Cardiovascular: Normal rate and normal heart sounds.  No murmur heard. Pulmonary/Chest: Effort normal and breath sounds normal. He has no wheezes.  Musculoskeletal: He exhibits no edema or tenderness.  Neurological: He is alert and oriented to person, place, and time.  Skin: Skin is warm and dry. Capillary refill takes less than 2 seconds. Rash noted. He is not diaphoretic.  Psychiatric: He has a normal mood and affect.   Assessment & Plan:   See Encounters Tab for problem based charting.  Patient discussed with Dr. Beryle Beams

## 2017-12-15 NOTE — Assessment & Plan Note (Signed)
Folliculitis: There are at least 9 small follicular lesions consistent with infection of the hair follicle. They are pustular, consisting of a 2-3mm erythematous base and rupture once they have developed. There are associated with mild pruritis.  They are distributed throughout the belt line at the waist, inner thighs, axilla, and pretibial area consistent with high friction areas, most closely resembling folliculitis.  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  He denies fever, chills, nausea, vomiting, diarrhea, constipation, muscle aches, visual changes, headaches or other concerning symptoms today.  There is no evidence of oral or genital lesions. I am not convinced that these are MRSA but will treat the skin and nares prophylactically for MRSA and the current lesions actively for MSSA. I believe the bleach baths will also assist with decreasing the rate of recurrence of the lesions.   Plan: Bleach baths as described in the AVS. Continue the mupirocin ointment previously prescribed, patient stated that he was not using this for his toe but will now use it for his nares.  Cephalexin 250mg  QID for his current infection.

## 2017-12-15 NOTE — Progress Notes (Signed)
Medicine attending: Medical history, presenting problems, physical findings, and medications, reviewed with resident physician Dr Lawrence Harbrecht on the day of the patient visit and I concur with his evaluation and management plan. 

## 2018-05-03 ENCOUNTER — Other Ambulatory Visit: Payer: Self-pay | Admitting: Internal Medicine

## 2018-05-03 DIAGNOSIS — I10 Essential (primary) hypertension: Secondary | ICD-10-CM

## 2018-05-07 NOTE — Telephone Encounter (Signed)
sch PCP Nov / Dec / Jan HTN F/U

## 2018-05-08 NOTE — Telephone Encounter (Signed)
Appt has sch for June 25, 2018 with Dr. Tarri Abernethy.

## 2018-06-25 ENCOUNTER — Ambulatory Visit: Payer: 59 | Admitting: Internal Medicine

## 2018-06-25 ENCOUNTER — Other Ambulatory Visit: Payer: Self-pay

## 2018-06-25 ENCOUNTER — Encounter: Payer: Self-pay | Admitting: Internal Medicine

## 2018-06-25 VITALS — BP 130/72 | HR 73 | Temp 98.0°F | Ht 68.5 in | Wt 165.7 lb

## 2018-06-25 DIAGNOSIS — Z0000 Encounter for general adult medical examination without abnormal findings: Secondary | ICD-10-CM

## 2018-06-25 DIAGNOSIS — I10 Essential (primary) hypertension: Secondary | ICD-10-CM | POA: Diagnosis not present

## 2018-06-25 DIAGNOSIS — L720 Epidermal cyst: Secondary | ICD-10-CM | POA: Diagnosis not present

## 2018-06-25 DIAGNOSIS — Z8673 Personal history of transient ischemic attack (TIA), and cerebral infarction without residual deficits: Secondary | ICD-10-CM | POA: Diagnosis not present

## 2018-06-25 DIAGNOSIS — L299 Pruritus, unspecified: Secondary | ICD-10-CM

## 2018-06-25 DIAGNOSIS — Z79899 Other long term (current) drug therapy: Secondary | ICD-10-CM

## 2018-06-25 DIAGNOSIS — Z86010 Personal history of colonic polyps: Secondary | ICD-10-CM

## 2018-06-25 NOTE — Assessment & Plan Note (Signed)
Patient describes episodes of pruritus primarily occurring on his back. These episodes occur at night and are not daily. He states that it has been going on for several months. He is not tried anything for this sensation. He does not notice any correlations. He denies constipation, diarrhea, visual changes, changes in taste, heat or cold intolerance, flushing, dizziness. Physical exam there are no diffuse notable lesions on the skin. We discussed trying Benadryl for symptomatic relief and this may help [name] determine whether this is a histamine mediated process or not. Depending on whether he gets relief with Benadryl or not will determine next steps in workup.

## 2018-06-25 NOTE — Assessment & Plan Note (Addendum)
Colonoscopy done in 2014 with one polyp. Pathology reviewed, but nine. Next colonoscopy in 2024. Patient received his influenza vaccine at work.

## 2018-06-25 NOTE — Assessment & Plan Note (Signed)
Patient with well-controlled hypertension on amlodipine 5 mg and hydrochlorothiazide 25 mg daily. Not experiencing any adverse effects secondary to these medications. No issue obtaining or taking these medications. We will continue these medications and check BMP today. No changes to medical management at this point.

## 2018-06-25 NOTE — Patient Instructions (Signed)
Thank you for allowing me to provide your care. Today we're not making any changes to her medications. We will check some blood work and I will call you if we need to do anything different or change any of your medications. I will put in a referral for you to get your colonoscopy.  In regards to the itching on your back next time this occurs please try some Benadryl and if that alleviates the itching please let me know. If it does not it will tell us what we need to do next.  The bump on your back is likely a epidermoid cyst which is very similar to a boil. Please do not pop it. It should resolve on its own. If it becomes bothersome or enlarges please call our office and we can get you scheduled to have it removed.

## 2018-06-25 NOTE — Assessment & Plan Note (Signed)
Patient with a history of CVA in 2014. Currently on Crestor for cholesterol control and his blood pressure is well controlled. We will recheck lipid panel today. Otherwise continuing medical management.

## 2018-06-25 NOTE — Progress Notes (Signed)
   CC: F/U HTN  HPI:  [name] is a 62 y.o. male who presented to the clinic for continued evaluation and management of his chronic medical illnesses. For a detailed assessment and plan please refer to problem based charting below.   Past Medical History:  Diagnosis Date  . Cocaine abuse ([place])    quit  . ETOH abuse    quit  . Hyperlipidemia   . Hypertension   . Stroke [facility])    posterior limb, R internal capsule stroke 07/2012   Review of Systems:  12 point ROS preformed. All negative aside from those mentioned in the HPI.  Physical Exam: Vitals:   06/25/18 1435  BP: 130/72  Pulse: 73  Temp: 98 F (36.7 C)  TempSrc: Oral  SpO2: 96%  Weight: 165 lb 11.2 oz (75.2 kg)  Height: 5' 8.5" (1.74 m)   General: Well nourished male in no acute distress Pulm: Good air movement with no wheezing or crackles  CV: RRR, no murmurs, no rubs  Abdomen: Active bowel sounds, soft, non-distended, no tenderness to palpation  Skin: Well circumscribed, mobile nodule on the superior border of the right scapula with black head, no other rashes or lesions.   Assessment & Plan:   See Encounters Tab for problem based charting.  Patient discussed with Dr. Rebeca Alert

## 2018-06-25 NOTE — Assessment & Plan Note (Signed)
Patient has noticed a nodule on his back. States that he has been there for several weeks. Has had similar lesions in the past. It is not bothersome. Physical exam is significant for well demarcated mobile nodule with apparent track/blackhead. This is consistent with an epidermoid cyst. Education given that these should resolve on their own however if becomes bothersome or persistent can remove with bedside procedure. Patient voices understanding. No intervention at this point.

## 2018-06-26 LAB — BMP8+ANION GAP
Anion Gap: 17 mmol/L (ref 10.0–18.0)
BUN/Creatinine Ratio: 17 (ref 10–24)
BUN: 14 mg/dL (ref 8–27)
CO2: 23 mmol/L (ref 20–29)
Calcium: 9.2 mg/dL (ref 8.6–10.2)
Chloride: 98 mmol/L (ref 96–106)
Creatinine, Ser: 0.81 mg/dL (ref 0.76–1.27)
GFR calc Af Amer: 109 mL/min/{1.73_m2} (ref >59)
GFR calc non Af Amer: 95 mL/min/{1.73_m2} (ref >59)
Glucose: 95 mg/dL (ref 65–99)
Potassium: 3.6 mmol/L (ref 3.5–5.2)
Sodium: 138 mmol/L (ref 134–144)

## 2018-06-26 LAB — LIPID PANEL
Chol/HDL Ratio: 2.9 ratio (ref 0.0–5.0)
Cholesterol, Total: 142 mg/dL (ref 100–199)
HDL: 49 mg/dL (ref >39)
LDL Calculated: 74 mg/dL (ref 0–99)
Triglycerides: 93 mg/dL (ref 0–149)
VLDL Cholesterol Cal: 19 mg/dL (ref 5–40)

## 2018-06-26 NOTE — Progress Notes (Signed)
Internal Medicine Clinic Attending  Case discussed with Dr. Helberg at the time of the visit.  We reviewed the resident's history and exam and pertinent patient test results.  I agree with the assessment, diagnosis, and plan of care documented in the resident's note.  Alexander Raines, M.D., Ph.D.  

## 2018-08-08 ENCOUNTER — Other Ambulatory Visit: Payer: Self-pay

## 2018-08-08 DIAGNOSIS — I10 Essential (primary) hypertension: Secondary | ICD-10-CM

## 2018-08-08 DIAGNOSIS — Z8673 Personal history of transient ischemic attack (TIA), and cerebral infarction without residual deficits: Secondary | ICD-10-CM

## 2018-08-08 MED ORDER — ROSUVASTATIN CALCIUM 20 MG PO TABS: 20.0000 mg | ORAL_TABLET | Freq: Every day | ORAL | 1 refills | Status: DC

## 2018-08-08 MED ORDER — HYDROCHLOROTHIAZIDE 25 MG PO TABS: 25.0000 mg | ORAL_TABLET | Freq: Every day | ORAL | 1 refills | Status: DC

## 2018-08-08 MED ORDER — AMLODIPINE BESYLATE 5 MG PO TABS: 5.0000 mg | ORAL_TABLET | Freq: Every day | ORAL | 1 refills | Status: DC

## 2018-08-08 NOTE — Telephone Encounter (Signed)
amLODipine (NORVASC) 5 MG tablet,  hydrochlorothiazide (HYDRODIURIL) 25 MG tablet,  rosuvastatin (CRESTOR) 20 MG tablet, refill request @  [place], [place] - [address] [phone] (Phone) [phone] (Fax)   Pt states the pharmacy is still waiting for the doctor to reply back.

## 2018-08-09 ENCOUNTER — Other Ambulatory Visit: Payer: Self-pay | Admitting: Internal Medicine

## 2018-08-09 NOTE — Telephone Encounter (Signed)
Needs a refill on all his medicine at [place], [place] - [address] ; pt contact [phone]

## 2018-08-09 NOTE — Telephone Encounter (Signed)
Pt stated he needs refills on amLODipine, hydrochlorothiazide, and rosuvastatin - informed they were refilled yesterday and sent to [facility]. Instructed to call the pharmacy; stated he will.

## 2018-08-13 ENCOUNTER — Other Ambulatory Visit: Payer: Self-pay

## 2018-08-13 DIAGNOSIS — N529 Male erectile dysfunction, unspecified: Secondary | ICD-10-CM

## 2018-08-13 NOTE — Telephone Encounter (Signed)
   tadalafil (CIALIS) 10 MG tablet(Expired)   Refill request @  [place], [place] - [address] [phone] (Phone) [phone] (Fax)

## 2018-08-14 MED ORDER — TADALAFIL 10 MG PO TABS: 10.0000 mg | ORAL_TABLET | ORAL | 6 refills | Status: DC | PRN

## 2019-01-31 ENCOUNTER — Ambulatory Visit: Payer: 59 | Admitting: Internal Medicine

## 2019-01-31 ENCOUNTER — Encounter: Payer: Self-pay | Admitting: Internal Medicine

## 2019-01-31 ENCOUNTER — Other Ambulatory Visit: Payer: Self-pay

## 2019-01-31 VITALS — BP 134/82 | HR 72 | Temp 98.0°F | Wt 167.2 lb

## 2019-01-31 DIAGNOSIS — I10 Essential (primary) hypertension: Secondary | ICD-10-CM | POA: Diagnosis not present

## 2019-01-31 DIAGNOSIS — Z79899 Other long term (current) drug therapy: Secondary | ICD-10-CM

## 2019-01-31 DIAGNOSIS — Z86010 Personal history of colonic polyps: Secondary | ICD-10-CM

## 2019-01-31 DIAGNOSIS — Z8673 Personal history of transient ischemic attack (TIA), and cerebral infarction without residual deficits: Secondary | ICD-10-CM

## 2019-01-31 DIAGNOSIS — L720 Epidermal cyst: Secondary | ICD-10-CM | POA: Diagnosis not present

## 2019-01-31 DIAGNOSIS — N529 Male erectile dysfunction, unspecified: Secondary | ICD-10-CM | POA: Diagnosis not present

## 2019-01-31 MED ORDER — TADALAFIL 20 MG PO TABS: 20.0000 mg | ORAL_TABLET | ORAL | 0 refills | Status: DC | PRN

## 2019-01-31 MED ORDER — ROSUVASTATIN CALCIUM 20 MG PO TABS: 20.0000 mg | ORAL_TABLET | Freq: Every day | ORAL | 3 refills | Status: DC

## 2019-01-31 NOTE — Patient Instructions (Signed)
Thank you for allowing me to provide your care. Today we are increasing one of your medications. Please let me know if it does not help. Continue all other medications as prescribed.   I would like to see you back in 6 months or sooner if anything arises.

## 2019-01-31 NOTE — Progress Notes (Signed)
   CC: F/u HTN, epidermoid cyst, colon polyps, ED  HPI:  Mr.Aum A Fessel is a 63 y.o. male with PMHx listed below presenting for F/u HTN, epidermoid cyst, colon polyps, ED. Please see the A&P for the status of the patient's chronic medical problems.  Past Medical History:  Diagnosis Date  . Cocaine abuse ([place])    quit  . ETOH abuse    quit  . Hyperlipidemia   . Hypertension   . Stroke [facility])    posterior limb, R internal capsule stroke 07/2012   Review of Systems:  Performed and all others negative.  Physical Exam: Vitals:   01/31/19 1432  BP: 134/82  Pulse: 72  Temp: 98 F (36.7 C)  TempSrc: Oral  SpO2: 98%  Weight: 167 lb 3.2 oz (75.8 kg)   General: Well nourished male in no acute distress CV: RRR, no murmurs, no rubs  GU: Normal testicular volume, no masses or nodules appreciated, no lesions on the penis  Extremities: Pulses palpable in all extremities  Assessment & Plan:   See Encounters Tab for problem based charting.  Patient discussed with Dr. Rebeca Alert

## 2019-01-31 NOTE — Assessment & Plan Note (Signed)
Refilled patient's Rosuvastatin.

## 2019-01-31 NOTE — Assessment & Plan Note (Addendum)
Issues with getting and maintaining an erection. He states that he does not achieve the desired firmness and has difficulty with penetration. He continues to have morning erections. He has had no change in desire. Denies increased life stressor. He is not on any medications that could be causing his ED. Does not use EtOH or marijuana. He denies LE claudication symptoms.   PE he has palpable posterior tibial and dorsalis pedis pulses bilaterally. He does have decrease air growth of he BLEs. GU exam is unremarkable.   A/P: - Do not suspect neurogenic or hormonal causes given hx.  - Medication list reviewed and discuss other substances that could contribute.  - At his next visit we will need to check an A1c as he has had a previously abnormal result - Suspect vascular given risk factors and hx. Will try to increase tadalafil to 20 mg PRN. Discuss risk/benefits.

## 2019-01-31 NOTE — Assessment & Plan Note (Signed)
History of 3 mm adenomatous polyp in 2014. Needs follow-up colonoscopy. Referral sent.

## 2019-01-31 NOTE — Assessment & Plan Note (Signed)
Well controlled HTN. On Amlodipine 5 mg and HCTZ 25 mg QD. He is tolerating the medications without issues. He is able to afford the medications. He had a BMP checked within the past 6 months with normal renal function and potassium.  A/P: - Continue Amlodipine 5 mg and HCTZ 25 mg QD. - Check BMP at next visit

## 2019-01-31 NOTE — Assessment & Plan Note (Signed)
>>  ASSESSMENT AND PLAN FOR HISTORY OF COLONIC POLYPS WRITTEN ON 01/31/2019  2:30 PM BY HELBERG, JUSTIN, MD  History of 3 mm adenomatous polyp in 2014. Needs follow-up colonoscopy. Referral sent.

## 2019-02-01 NOTE — Progress Notes (Signed)
Internal Medicine Clinic Attending  Case discussed with Dr. Helberg at the time of the visit.  We reviewed the resident's history and exam and pertinent patient test results.  I agree with the assessment, diagnosis, and plan of care documented in the resident's note.  Alexander Raines, M.D., Ph.D.  

## 2019-02-16 ENCOUNTER — Other Ambulatory Visit: Payer: Self-pay | Admitting: Internal Medicine

## 2019-02-16 DIAGNOSIS — I10 Essential (primary) hypertension: Secondary | ICD-10-CM

## 2019-02-25 ENCOUNTER — Encounter: Payer: Self-pay | Admitting: Internal Medicine

## 2019-03-14 ENCOUNTER — Other Ambulatory Visit: Payer: Self-pay

## 2019-03-14 ENCOUNTER — Ambulatory Visit (AMBULATORY_SURGERY_CENTER): Payer: Self-pay

## 2019-03-14 VITALS — Temp 96.9°F | Ht 68.5 in | Wt 161.0 lb

## 2019-03-14 DIAGNOSIS — Z86010 Personal history of colonic polyps: Secondary | ICD-10-CM

## 2019-03-14 MED ORDER — SUPREP BOWEL PREP KIT 17.5-3.13-1.6 GM/177ML PO SOLN: 1.0000 | Freq: Once | ORAL | 0 refills | Status: AC

## 2019-03-14 NOTE — Progress Notes (Signed)
Per pt, no allergies to soy or egg products.Pt not taking any weight loss meds or using  O2 at home.  Pt denies sedation problem.  Pt refused emmi video. 

## 2019-03-18 ENCOUNTER — Encounter: Payer: Self-pay | Admitting: Internal Medicine

## 2019-03-27 ENCOUNTER — Telehealth: Payer: Self-pay

## 2019-03-27 NOTE — Telephone Encounter (Signed)
Covid-19 screening questions   Do you now or have you had a fever in the last 14 days?  Do you have any respiratory symptoms of shortness of breath or cough now or in the last 14 days?  Do you have any family members or close contacts with diagnosed or suspected Covid-19 in the past 14 days?  Have you been tested for Covid-19 and found to be positive?       

## 2019-03-27 NOTE — Telephone Encounter (Signed)
Patient called back answered "no" to all questions.

## 2019-03-28 ENCOUNTER — Ambulatory Visit (AMBULATORY_SURGERY_CENTER): Payer: 59 | Admitting: Internal Medicine

## 2019-03-28 ENCOUNTER — Encounter: Payer: Self-pay | Admitting: Internal Medicine

## 2019-03-28 ENCOUNTER — Other Ambulatory Visit: Payer: Self-pay

## 2019-03-28 ENCOUNTER — Encounter: Payer: 59 | Admitting: Internal Medicine

## 2019-03-28 VITALS — BP 126/83 | HR 69 | Temp 98.1°F | Resp 13 | Ht 68.5 in | Wt 161.0 lb

## 2019-03-28 DIAGNOSIS — D125 Benign neoplasm of sigmoid colon: Secondary | ICD-10-CM

## 2019-03-28 DIAGNOSIS — K635 Polyp of colon: Secondary | ICD-10-CM

## 2019-03-28 DIAGNOSIS — Z86010 Personal history of colonic polyps: Secondary | ICD-10-CM | POA: Diagnosis present

## 2019-03-28 DIAGNOSIS — D120 Benign neoplasm of cecum: Secondary | ICD-10-CM

## 2019-03-28 MED ORDER — SODIUM CHLORIDE 0.9 % IV SOLN: 500.0000 mL | Freq: Once | INTRAVENOUS | Status: DC

## 2019-03-28 NOTE — Progress Notes (Signed)
Called to room to assist during endoscopic procedure.  Patient ID and intended procedure confirmed with present staff. Received instructions for my participation in the procedure from the performing physician.  

## 2019-03-28 NOTE — Patient Instructions (Addendum)
Read all of the handouts given to you by your recovery room nurse.  I found and removed 2 polyps.  I will let you know pathology results and when to have another routine colonoscopy by mail and/or My Chart.  I appreciate the opportunity to care for you. Gatha Mayer, MD, FACG YOU HAD AN ENDOSCOPIC PROCEDURE TODAY AT [facility]:   Refer to the procedure report that was given to you for any specific questions about what was found during the examination.  If the procedure report does not answer your questions, please call your gastroenterologist to clarify.  If you requested that your care partner not be given the details of your procedure findings, then the procedure report has been included in a sealed envelope for you to review at your convenience later.  YOU SHOULD EXPECT: Some feelings of bloating in the abdomen. Passage of more gas than usual.  Walking can help get rid of the air that was put into your GI tract during the procedure and reduce the bloating. If you had a lower endoscopy (such as a colonoscopy or flexible sigmoidoscopy) you may notice spotting of blood in your stool or on the toilet paper. If you underwent a bowel prep for your procedure, you may not have a normal bowel movement for a few days.  Please Note:  You might notice some irritation and congestion in your nose or some drainage.  This is from the oxygen used during your procedure.  There is no need for concern and it should clear up in a day or so.  SYMPTOMS TO REPORT IMMEDIATELY:   Following lower endoscopy (colonoscopy or flexible sigmoidoscopy):  Excessive amounts of blood in the stool  Significant tenderness or worsening of abdominal pains  Swelling of the abdomen that is new, acute  Fever of 100F or higher   For urgent or emergent issues, a gastroenterologist can be reached at any hour by calling [phone].   DIET:  We do recommend a small meal at first, but then you may proceed to  your regular diet.  Drink plenty of fluids but you should avoid alcoholic beverages for 24 hours.  ACTIVITY:  You should plan to take it easy for the rest of today and you should NOT DRIVE or use heavy machinery until tomorrow (because of the sedation medicines used during the test).    FOLLOW UP: Our staff will call the number listed on your records 48-72 hours following your procedure to check on you and address any questions or concerns that you may have regarding the information given to you following your procedure. If we do not reach you, we will leave a message.  We will attempt to reach you two times.  During this call, we will ask if you have developed any symptoms of COVID 19. If you develop any symptoms (ie: fever, flu-like symptoms, shortness of breath, cough etc.) before then, please call [phone].  If you test positive for Covid 19 in the 2 weeks post procedure, please call and report this information to [name].    If any biopsies were taken you will be contacted by phone or by letter within the next 1-3 weeks.  Please call us at [phone] if you have not heard about the biopsies in 3 weeks.    SIGNATURES/CONFIDENTIALITY: You and/or your care partner have signed paperwork which will be entered into your electronic medical record.  These signatures attest to the fact that that the information above  on your After Visit Summary has been reviewed and is understood.  Full responsibility of the confidentiality of this discharge information lies with you and/or your care-partner.

## 2019-03-28 NOTE — Progress Notes (Signed)
A and O x3. Report to RN. Tolerated MAC anesthesia well.

## 2019-03-28 NOTE — Op Note (Signed)
[place] Patient Name: [name] Procedure Date: 03/28/2019 11:37 AM MRN: [id number] Endoscopist: Gatha Mayer , MD Age: 63 Referring MD:  Date of Birth: [birth year] Gender: Male Account #: [id number] Procedure:                Colonoscopy Indications:              Surveillance: Personal history of adenomatous                            polyps on last colonoscopy > 5 years ago Medicines:                Propofol per Anesthesia, Monitored Anesthesia Care Procedure:                Pre-Anesthesia Assessment:                           - Prior to the procedure, a History and Physical                            was performed, and patient medications and                            allergies were reviewed. The patient's tolerance of                            previous anesthesia was also reviewed. The risks                            and benefits of the procedure and the sedation                            options and risks were discussed with the patient.                            All questions were answered, and informed consent                            was obtained. Prior Anticoagulants: The patient has                            taken no previous anticoagulant or antiplatelet                            agents. ASA Grade Assessment: II - A patient with                            mild systemic disease. After reviewing the risks                            and benefits, the patient was deemed in                            satisfactory condition to undergo the procedure.  After obtaining informed consent, the colonoscope                            was passed under direct vision. Throughout the                            procedure, the patient's blood pressure, pulse, and                            oxygen saturations were monitored continuously. The                            Colonoscope was introduced through the anus and   advanced to the the cecum, identified by                            appendiceal orifice and ileocecal valve. The                            colonoscopy was performed without difficulty. The                            patient tolerated the procedure well. The bowel                            preparation used was SUPREP via split dose                            instruction. The quality of the bowel preparation                            was excellent. The ileocecal valve, appendiceal                            orifice, and rectum were photographed. Scope In: 11:45:27 AM Scope Out: 11:57:55 AM Scope Withdrawal Time: 0 hours 10 minutes 6 seconds  Total Procedure Duration: 0 hours 12 minutes 28 seconds  Findings:                 The perianal and digital rectal examinations were                            normal. Pertinent negatives include normal prostate                            (size, shape, and consistency).                           Two sessile polyps were found in the sigmoid colon                            and ascending colon. The polyps were diminutive in                            size. These polyps were  removed with a cold snare.                            Resection and retrieval were complete. Verification                            of patient identification for the specimen was                            done. Estimated blood loss was minimal.                           Scattered diverticula were found in the entire                            colon.                           The exam was otherwise without abnormality on                            direct and retroflexion views. Complications:            No immediate complications. Estimated Blood Loss:     Estimated blood loss was minimal. Impression:               - Two diminutive polyps in the sigmoid colon and in                            the ascending colon, removed with a cold snare.                            Resected and  retrieved.                           - Diverticulosis in the entire examined colon.                           - The examination was otherwise normal on direct                            and retroflexion views.                           - Personal history of colonic polyp - diminutive                            adenoma removed 2019. Recommendation:           - Patient has a contact number available for                            emergencies. The signs and symptoms of potential                            delayed complications were discussed with the  patient. Return to normal activities tomorrow.                            Written discharge instructions were provided to the                            patient.                           - Resume previous diet.                           - Continue present medications.                           - Repeat colonoscopy is recommended. The                            colonoscopy date will be determined after pathology                            results from today's exam become available for                            review. Gatha Mayer, MD 03/28/2019 12:08:19 PM This report has been signed electronically.

## 2019-03-28 NOTE — Progress Notes (Signed)
Pt's states no medical or surgical changes since previsit or office visit. 

## 2019-04-01 ENCOUNTER — Telehealth: Payer: Self-pay

## 2019-04-01 NOTE — Telephone Encounter (Signed)
Left message on follow up call. 

## 2019-04-01 NOTE — Telephone Encounter (Signed)
  Follow up Call-  Call back number 03/28/2019  Post procedure Call Back phone  # [phone]  Permission to leave phone message Yes  Some recent data might be hidden     Patient questions:  Do you have a fever, pain , or abdominal swelling? No. Pain Score  0 *  Have you tolerated food without any problems? Yes.    Have you been able to return to your normal activities? Yes.    Do you have any questions about your discharge instructions: Diet   No. Medications  No. Follow up visit  No.  Do you have questions or concerns about your Care? No.  Actions: * If pain score is 4 or above: No action needed, pain <4. 1. Have you developed a fever since your procedure? no  2.   Have you had an respiratory symptoms (SOB or cough) since your procedure? no  3.   Have you tested positive for COVID 19 since your procedure no  4.   Have you had any family members/close contacts diagnosed with the COVID 19 since your procedure?  no   If yes to any of these questions please route to Joylene John, RN and Alphonsa Gin, Therapist, sports.

## 2019-04-05 ENCOUNTER — Encounter: Payer: Self-pay | Admitting: Internal Medicine

## 2019-04-05 NOTE — Progress Notes (Signed)
Diminutive adenoma + sigmoid hyperplastic polyp Recall 2027 Dr. Hilarie Fredrickson

## 2019-04-16 ENCOUNTER — Ambulatory Visit: Payer: 59 | Admitting: Internal Medicine

## 2019-04-16 ENCOUNTER — Other Ambulatory Visit: Payer: Self-pay

## 2019-04-16 VITALS — BP 136/95 | HR 70 | Temp 98.0°F | Ht 68.5 in | Wt 165.0 lb

## 2019-04-16 DIAGNOSIS — N529 Male erectile dysfunction, unspecified: Secondary | ICD-10-CM | POA: Diagnosis not present

## 2019-04-16 DIAGNOSIS — E785 Hyperlipidemia, unspecified: Secondary | ICD-10-CM

## 2019-04-16 DIAGNOSIS — Z8673 Personal history of transient ischemic attack (TIA), and cerebral infarction without residual deficits: Secondary | ICD-10-CM

## 2019-04-16 DIAGNOSIS — L299 Pruritus, unspecified: Secondary | ICD-10-CM

## 2019-04-16 DIAGNOSIS — I10 Essential (primary) hypertension: Secondary | ICD-10-CM

## 2019-04-16 DIAGNOSIS — Z79899 Other long term (current) drug therapy: Secondary | ICD-10-CM

## 2019-04-16 LAB — POCT GLYCOSYLATED HEMOGLOBIN (HGB A1C): Hemoglobin A1C: 6.2 % — AB (ref 4.0–5.6)

## 2019-04-16 LAB — GLUCOSE, CAPILLARY: Glucose-Capillary: 104 mg/dL — ABNORMAL HIGH (ref 70–99)

## 2019-04-16 MED ORDER — SILDENAFIL CITRATE 50 MG PO TABS: 50.0000 mg | ORAL_TABLET | Freq: Every day | ORAL | 0 refills | Status: DC | PRN

## 2019-04-16 NOTE — Assessment & Plan Note (Signed)
[name] reports itching all over his back for the past 1 month that is intermittent.  The itching is worsened by getting out of the shower but does occur occasionally randomly throughout the day.  This does not occur daily often enough to really aggravate him.  He asked someone recently you to look at his back when it is itchy and they reported seeing flesh-colored bumps that were raised that went away after a while, consistent with possible hives.  He has tried putting on oil after showering without alleviation of itching.  He has not tried any antihistamines, including Benadryl and Claritin as discussed previously with his PCP.  Physical examination was negative.  No evidence of any erythema, hives, or rash.  We discussed that this could be a histamine reaction and the best way to narrow the differential would be to try taking Benadryl on pruritus onset.  Discussed that if he is at work or operating heavy machinery, he could take Claritin instead as it is nondrowsy.  Patient understands and is willing to do so.  He will follow-up with his PCP if no improvement.  Plan: - Benadryl or Claritin on pruritis onset.

## 2019-04-16 NOTE — Progress Notes (Signed)
   CC: Rash  HPI:  [name] is a 63 y.o. male with a PMHx of HTN, HLD, CVA in 2014 who presents to the clinic for evaluation of a rash.   Please see the Encounters tab forproblem-based Assessment&Plan for status of patients chronic conditions.  Past Medical History:  Diagnosis Date  . Arthritis    due to age  . Cocaine abuse ([place])    quit  . ETOH abuse    quit  . Hx of adenomatous polyp of colon   . Hyperlipidemia   . Hypertension   . Stroke [facility])    posterior limb, R internal capsule stroke 07/2012   Review of Systems:  Review of Systems  Constitutional: Negative for chills and fever.  Respiratory: Negative for cough and shortness of breath.   Cardiovascular: Negative for chest pain, claudication and leg swelling.  Gastrointestinal: Negative for abdominal pain, diarrhea, nausea and vomiting.  Genitourinary:       Endorses erectile dysfunction  Musculoskeletal: Negative for joint pain and myalgias.  Skin: Positive for itching and rash.  Neurological: Negative for dizziness and headaches.   Physical Exam:  Vitals:   04/16/19 0957 04/16/19 1005  BP: (!) 164/104 (!) 136/95  Pulse: 67 70  Temp: 98 F (36.7 C)   TempSrc: Oral   SpO2: 100%   Weight: 165 lb (74.8 kg)   Height: 5' 8.5" (1.74 m)    Physical Exam Vitals signs and nursing note reviewed.  Constitutional:      Appearance: He is normal weight.  Musculoskeletal:        General: No swelling or deformity.     Right lower leg: No edema.     Left lower leg: No edema.  Skin:    General: Skin is warm and dry.     Comments: Decreased hair bilaterally starting at mid-calf area  Neurological:     General: No focal deficit present.     Mental Status: He is alert and oriented to person, place, and time. Mental status is at baseline.  Psychiatric:        Mood and Affect: Mood normal.        Behavior: Behavior normal.    Assessment & Plan:   See Encounters Tab for problem based charting.  Patient  seen with Dr. Lynnae January

## 2019-04-16 NOTE — Assessment & Plan Note (Signed)
[name] notes that he continues to have erectile dysfunction despite Cialis use.  He states that he continues to be able to obtain an erection but not maintain.  His erection is usually gone by the time he is able to put on a condom.  This has begun to affect his relationship with his partner and would like to try other options.  He denies any nausea, vomiting, abdominal pain, leg pain consistent with claudication, polydipsia or polyuria.    Previous note by PCP notes differential includes vascular causes, and this is supported by his decreased leg year.  His last A1c was 6 years ago and was elevated at 6.2%.  Recheck today returned at 6.2% again.  Unlikely blood glucose to be contributing.  ABIs obtained during office visit showed normal results with left being 1.15 and right being 1.19.  For symptom relief, Cialis was discontinued day and patient was started on sildenafil 50 mg. We discussed possible side effects and risks.  Strongly recommended he never take more than 2 during any sexual encounter as his likelihood of serious side effects would increase.  Patient expressed understanding and would like to continue with sildenafil.  Plan: - Sildenafil 50 mg as needed for ED - Discontinue Cialis

## 2019-04-16 NOTE — Patient Instructions (Signed)
It was nice seeing you today! Thank you for choosing [facility] for your Primary Care.    Today we talked about:   1. Itching on your back: Try taking one benadryl when the itching starts. If Benadryl causes you to be sleepy, please try Claritin instead.  2. Erectile Dysfunction: We have switched your medication from Cialis to Viagra. Take 1 tablet, about 1 hour beforehand. Works best on an Systems developer. If you experience at chest pain, please stop using and call the clinic. Do not take more than 2 at once.

## 2019-04-20 NOTE — Progress Notes (Signed)
Internal Medicine Clinic Attending  I saw and evaluated the patient.  I personally confirmed the key portions of the history and exam documented by Dr. Basaraba and I reviewed pertinent patient test results.  The assessment, diagnosis, and plan were formulated together and I agree with the documentation in the resident's note.    

## 2019-05-07 ENCOUNTER — Telehealth: Payer: Self-pay

## 2019-05-07 DIAGNOSIS — N529 Male erectile dysfunction, unspecified: Secondary | ICD-10-CM

## 2019-05-07 NOTE — Telephone Encounter (Signed)
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 Would like 100mg  tablets and needs more because he has run out. Offered an appt this week w/ dr Tarri Abernethy and he refused. Cautioned him that he needs to schedule an appt for November since he is to f/u in 3 months, he stated he would.

## 2019-05-07 NOTE — Telephone Encounter (Signed)
Requesting to speak with a nurse about sildenafil (VIAGRA) 50 MG tablet. Please call pt back.

## 2019-05-09 MED ORDER — SILDENAFIL CITRATE 100 MG PO TABS: 100.0000 mg | ORAL_TABLET | Freq: Every day | ORAL | 0 refills | Status: DC | PRN

## 2019-05-22 ENCOUNTER — Encounter: Payer: Self-pay | Admitting: Internal Medicine

## 2019-05-22 ENCOUNTER — Other Ambulatory Visit: Payer: Self-pay

## 2019-05-22 ENCOUNTER — Ambulatory Visit: Payer: 59 | Admitting: Internal Medicine

## 2019-05-22 DIAGNOSIS — M5136 Other intervertebral disc degeneration, lumbar region: Secondary | ICD-10-CM | POA: Diagnosis not present

## 2019-05-22 DIAGNOSIS — M545 Low back pain, unspecified: Secondary | ICD-10-CM

## 2019-05-22 DIAGNOSIS — M549 Dorsalgia, unspecified: Secondary | ICD-10-CM | POA: Insufficient documentation

## 2019-05-22 DIAGNOSIS — G8929 Other chronic pain: Secondary | ICD-10-CM | POA: Diagnosis not present

## 2019-05-22 HISTORY — DX: Dorsalgia, unspecified: M54.9

## 2019-05-22 MED ORDER — CYCLOBENZAPRINE HCL 10 MG PO TABS: 10.0000 mg | ORAL_TABLET | Freq: Three times a day (TID) | ORAL | 0 refills | Status: AC | PRN

## 2019-05-22 MED ORDER — NAPROXEN 500 MG PO TABS: 500.0000 mg | ORAL_TABLET | Freq: Two times a day (BID) | ORAL | 0 refills | Status: DC | PRN

## 2019-05-22 NOTE — Assessment & Plan Note (Signed)
[name] has a history of chronic low back pain secondary to lumbar degenerative disc disease and presents today for evaluation of acute on chronic back pain.  He was in his usual state of health until this weekend when he woke up in the morning with acute lower back pain after exerting himself at work the day prior as well as sleeping uncomfortably. No red flag symptoms.  States his back pain usually flares once a year and resolves with NSAIDs and muscle relaxants.  Suspect likely lumbar strain in the setting of overexertion at work. Prescribed naproxen 500mg  BID PRN + flexeril 10 mg q8h PRN. Also recommended heating pad. Advised increased risk of bleeding with naproxen and aspirin and to call if he notices any signs/symptoms  of bleeding. Also advised to call us if pain does not improve within the next few weeks. Has appt with PCP in 4 weeks.

## 2019-05-22 NOTE — Progress Notes (Signed)
Internal Medicine Clinic Attending  Case discussed with Dr. Santos-Sanchez at the time of the visit.  We reviewed the resident's history and exam and pertinent patient test results.  I agree with the assessment, diagnosis, and plan of care documented in the resident's note.    

## 2019-05-22 NOTE — Patient Instructions (Signed)
Mr. Grasley,   I sent naproxen 500 mg that you can take twice a day as needed for back pain.  I also sent a prescription for Flexeril. This is a muscle relaxant.  You can take 1 tablet every 8 hours as needed.  Please avoid driving while on this medication as it can cause dizziness and sleepiness.  Please call us if you notice any signs of bleeding.   Let us know if the pain does not improve within the next week or so.  -Dr. Frederico Hamman

## 2019-05-22 NOTE — Progress Notes (Signed)
   CC: Acute on chronic back pain   HPI:  [name] is a 63 y.o. year-old male with PMH listed below who presents to clinic for acute on chronic back pain. Please see problem based assessment and plan for further details.   Past Medical History:  Diagnosis Date  . Arthritis    due to age  . Cocaine abuse ([place])    quit  . ETOH abuse    quit  . Hx of adenomatous polyp of colon   . Hyperlipidemia   . Hypertension   . Stroke [facility])    posterior limb, R internal capsule stroke 07/2012   Review of Systems:   Review of Systems  Constitutional: Negative for chills, fever, malaise/fatigue and weight loss.  Musculoskeletal: Positive for back pain. Negative for falls.  Neurological: Negative for dizziness.     Physical Exam:  Vitals:   05/22/19 1043  BP: (!) 141/81  Pulse: 63  Temp: 98.1 F (36.7 C)  TempSrc: Oral  SpO2: 98%  Weight: 169 lb 11.2 oz (77 kg)  Height: 5\' 8"  (1.727 m)    General: well appearing male in no acute distress  Neuro: A&Ox3, TTP on bilateral paraspinal muscles on lower back, no sensory or motor deficits noted, normal range of motion    Assessment & Plan:   See Encounters Tab for problem based charting.  Patient discussed with Dr. Dareen Piano

## 2019-05-28 ENCOUNTER — Other Ambulatory Visit: Payer: Self-pay | Admitting: Internal Medicine

## 2019-05-28 DIAGNOSIS — I10 Essential (primary) hypertension: Secondary | ICD-10-CM

## 2019-05-29 ENCOUNTER — Ambulatory Visit: Payer: 59 | Admitting: Internal Medicine

## 2019-05-29 ENCOUNTER — Encounter: Payer: Self-pay | Admitting: Internal Medicine

## 2019-05-29 ENCOUNTER — Other Ambulatory Visit: Payer: Self-pay

## 2019-05-29 ENCOUNTER — Other Ambulatory Visit: Payer: Self-pay | Admitting: *Deleted

## 2019-05-29 VITALS — BP 166/89 | HR 66 | Temp 98.7°F | Wt 166.2 lb

## 2019-05-29 DIAGNOSIS — M79601 Pain in right arm: Secondary | ICD-10-CM

## 2019-05-29 DIAGNOSIS — I10 Essential (primary) hypertension: Secondary | ICD-10-CM

## 2019-05-29 DIAGNOSIS — Z79899 Other long term (current) drug therapy: Secondary | ICD-10-CM

## 2019-05-29 DIAGNOSIS — M25521 Pain in right elbow: Secondary | ICD-10-CM | POA: Diagnosis not present

## 2019-05-29 HISTORY — DX: Pain in right arm: M79.601

## 2019-05-29 MED ORDER — HYDROCHLOROTHIAZIDE 25 MG PO TABS: 25.0000 mg | ORAL_TABLET | Freq: Every day | ORAL | 1 refills | Status: DC

## 2019-05-29 MED ORDER — AMLODIPINE BESYLATE 5 MG PO TABS: 5.0000 mg | ORAL_TABLET | Freq: Every day | ORAL | 1 refills | Status: DC

## 2019-05-29 MED ORDER — NAPROXEN 500 MG PO TABS: 500.0000 mg | ORAL_TABLET | Freq: Two times a day (BID) | ORAL | 0 refills | Status: AC | PRN

## 2019-05-29 NOTE — Progress Notes (Signed)
   CC: R elbow pain   HPI:  [name] is a 63 y.o. year-old male with PMH listed below who presents to clinic for arm pain. Please see problem based assessment and plan for further details.   Past Medical History:  Diagnosis Date  . Arthritis    due to age  . Cocaine abuse ([place])    quit  . ETOH abuse    quit  . Hx of adenomatous polyp of colon   . Hyperlipidemia   . Hypertension   . Stroke [facility])    posterior limb, R internal capsule stroke 07/2012   Review of Systems:   Review of Systems  Constitutional: Negative for chills, fever and malaise/fatigue.  Musculoskeletal: Positive for joint pain (R elbow).    Physical Exam:  Vitals:   05/29/19 0922  BP: (!) 166/89  Pulse: 66  Temp: 98.7 F (37.1 C)  TempSrc: Oral  SpO2: 100%  Weight: 166 lb 3.2 oz (75.4 kg)    General: Well-appearing male in no acute distress MSK: Right elbow joint has full range of motion.  Patient is tender to palpation over olecranon bursa but no signs of infection noted.    Assessment & Plan:   See Encounters Tab for problem based charting.  Patient discussed with Dr. Dareen Piano

## 2019-05-29 NOTE — Patient Instructions (Addendum)
[name],   The pain in your arm is due to bursitis. This is an inflammation of the fat pad in that joint. You can take anti inflammatories for this such as ibuprofen, aleve, advil, or naprosyn. Pain should go away in the next couple of weeks. You can also wear a protective pad for comfort.   I sent a refill of hydrochlorothiazide and amlodipine to your pharmacy.   Call us if the pain is not improving within 2 weeks or if is getting worse.   - Dr. Frederico Hamman

## 2019-05-29 NOTE — Assessment & Plan Note (Signed)
Hypertensive today after running out of blood pressure medication 3 days ago.  Refilled HCTZ 25 mg daily and amlodipine 5 mg daily.

## 2019-05-29 NOTE — Telephone Encounter (Signed)
Refilled

## 2019-05-29 NOTE — Assessment & Plan Note (Addendum)
Patient is presenting with intermittent R arm pain at the posterior elbow that started about 1 week ago. He describes it as tenderness over the area, but otherwise has a hard time describing it. He has noticed that is worse when he is driving. No issues with range of motion. No recent trauma or signs/symptoms of infection. He does recall having to exert excessive force with that arm recently at work. No issues with range of motion. On exam, he is very tender to palpation over olecranon bursa.  No erythema or swelling, or concerns for septic joint.  Suspect this is olecranon bursitis and recommended anti-inflammatory therapy with NSAIDs every 8 hours as needed for pain. Also recommended padding to protect joint while pain resolves.

## 2019-05-30 NOTE — Telephone Encounter (Signed)
Filled 11/11, closed encounter

## 2019-06-03 NOTE — Progress Notes (Signed)
Internal Medicine Clinic Attending  Case discussed with Dr. Santos-Sanchez at the time of the visit.  We reviewed the resident's history and exam and pertinent patient test results.  I agree with the assessment, diagnosis, and plan of care documented in the resident's note.    

## 2019-06-27 ENCOUNTER — Encounter: Payer: 59 | Admitting: Internal Medicine

## 2019-07-25 ENCOUNTER — Ambulatory Visit
Admission: RE | Admit: 2019-07-25 | Discharge: 2019-07-25 | Disposition: A | Discharge status: Home / Self Care | Payer: Self-pay | Source: Ambulatory Visit | Attending: Nurse Practitioner | Admitting: Nurse Practitioner

## 2019-07-25 ENCOUNTER — Other Ambulatory Visit: Payer: Self-pay | Admitting: Nurse Practitioner

## 2019-07-25 ENCOUNTER — Encounter: Payer: Self-pay | Admitting: Internal Medicine

## 2019-07-25 ENCOUNTER — Other Ambulatory Visit: Payer: Self-pay

## 2019-07-25 ENCOUNTER — Ambulatory Visit: Payer: 59 | Admitting: Internal Medicine

## 2019-07-25 ENCOUNTER — Encounter: Payer: 59 | Admitting: Internal Medicine

## 2019-07-25 VITALS — BP 139/94 | HR 86 | Temp 98.0°F | Ht 68.0 in | Wt 172.6 lb

## 2019-07-25 DIAGNOSIS — N529 Male erectile dysfunction, unspecified: Secondary | ICD-10-CM

## 2019-07-25 DIAGNOSIS — I10 Essential (primary) hypertension: Secondary | ICD-10-CM | POA: Diagnosis not present

## 2019-07-25 DIAGNOSIS — R52 Pain, unspecified: Secondary | ICD-10-CM

## 2019-07-25 DIAGNOSIS — E785 Hyperlipidemia, unspecified: Secondary | ICD-10-CM | POA: Diagnosis not present

## 2019-07-25 DIAGNOSIS — I6529 Occlusion and stenosis of unspecified carotid artery: Secondary | ICD-10-CM | POA: Diagnosis not present

## 2019-07-25 DIAGNOSIS — Z79899 Other long term (current) drug therapy: Secondary | ICD-10-CM

## 2019-07-25 MED ORDER — SILDENAFIL CITRATE 100 MG PO TABS: 100.0000 mg | ORAL_TABLET | ORAL | 1 refills | Status: DC | PRN

## 2019-07-25 MED ORDER — SILDENAFIL CITRATE 100 MG PO TABS: 100.0000 mg | ORAL_TABLET | Freq: Every day | ORAL | 0 refills | Status: DC | PRN

## 2019-07-25 MED ORDER — AMLODIPINE BESYLATE 10 MG PO TABS: 10.0000 mg | ORAL_TABLET | Freq: Every day | ORAL | 3 refills | Status: DC

## 2019-07-25 NOTE — Assessment & Plan Note (Signed)
Patient with uncontrolled hypertension. He is currently on amlodipine 5 mg daily and hydrochlorothiazide 25 mg daily. He is tolerating both these medications well without any apparent side effects. He denies orthostatic symptoms. I reviewed his last BMP should stable renal function and electrolytes.  A/P: - Above goal of 130/80  - Increase amlodipine to 10 mg QD  - Continue hydrochlorothiazide 25 mg QD

## 2019-07-25 NOTE — Assessment & Plan Note (Signed)
>>  ASSESSMENT AND PLAN FOR DYSLIPIDEMIA WRITTEN ON 07/25/2019  2:58 PM BY HELBERG, JUSTIN, MD  Patient with known hyperlipidemia. He is currently on rosuvastatin  20 mg once daily. His LDL is less than 100. We will continue rosuvastatin  20 mg once daily.

## 2019-07-25 NOTE — Progress Notes (Signed)
   CC: HTN, carotid artery atherosclerosis, HLD  HPI:  [name] is a 64 y.o. male with PMHx listed below presenting for HTN, carotid artery atherosclerosis, HLD. Please see the A&P for the status of the patient's chronic medical problems.  Past Medical History:  Diagnosis Date  . Arthritis    due to age  . Cocaine abuse ([place])    quit  . ETOH abuse    quit  . Hx of adenomatous polyp of colon   . Hyperlipidemia   . Hypertension   . Stroke [facility])    posterior limb, R internal capsule stroke 07/2012   Review of Systems:  Performed and all others negative.  Physical Exam: Vitals:   07/25/19 1342  BP: (!) 139/94  Pulse: 86  Temp: 98 F (36.7 C)  TempSrc: Oral  SpO2: 96%  Weight: 172 lb 9.6 oz (78.3 kg)  Height: 5\' 8"  (1.727 m)   General: Well nourished male in no acute distress HENT: No carotid bruits Pulm: Good air movement with no wheezing or crackles  CV: RRR, no murmurs, no rubs   Assessment & Plan:   See Encounters Tab for problem based charting.  Patient discussed with Dr. Philipp Ovens

## 2019-07-25 NOTE — Patient Instructions (Signed)
Thank you for allowing me to provide your care. I hope that your neck and arm start to feel better. Today we are increasing your amlodipine to 10 mg once a day. You can take two of your current tablets until you run out. Continue all your other medications as prescribed.  Please come back to see me in six months or sooner if any issues arise.

## 2019-07-25 NOTE — Assessment & Plan Note (Signed)
Patient with known erectile dysfunction. He is on sildenafil 100 mg PRN. He is tolerating this medication well. He needs a refill. We will send out a refill today.

## 2019-07-25 NOTE — Assessment & Plan Note (Signed)
Patient with known hyperlipidemia. He is currently on rosuvastatin 20 mg once daily. His LDL is less than 100. We will continue rosuvastatin 20 mg once daily.

## 2019-08-02 ENCOUNTER — Telehealth: Payer: Self-pay | Admitting: Internal Medicine

## 2019-08-02 NOTE — Telephone Encounter (Signed)
Went over COVID information Hands clean, away from face Surfaces clean Stay in away from others for 10 days Drink plenty of fluids Rest asap Wash linens well Wash dishes in dishwasher on sanitize Open windows a few times a day for circulation of fresh air Call 911 for chest pain or shortness of breath

## 2019-08-02 NOTE — Telephone Encounter (Signed)
Patient is not feeling well.  Was tested for COVID yesterday, but test results are not back.  Still feeling bad would like a call back on what to do.

## 2019-08-08 ENCOUNTER — Other Ambulatory Visit: Payer: Self-pay

## 2019-08-08 ENCOUNTER — Encounter: Payer: Self-pay | Admitting: Internal Medicine

## 2019-08-08 ENCOUNTER — Ambulatory Visit (INDEPENDENT_AMBULATORY_CARE_PROVIDER_SITE_OTHER): Payer: 59 | Admitting: Internal Medicine

## 2019-08-08 ENCOUNTER — Telehealth: Payer: Self-pay | Admitting: Internal Medicine

## 2019-08-08 DIAGNOSIS — J069 Acute upper respiratory infection, unspecified: Secondary | ICD-10-CM | POA: Diagnosis not present

## 2019-08-08 NOTE — Telephone Encounter (Signed)
Pt said he had a virus, pt has a very bad headache and he needs something for the pain [phone]

## 2019-08-08 NOTE — Patient Instructions (Signed)
Tele visit

## 2019-08-08 NOTE — Telephone Encounter (Signed)
Called pt - state he had a virus and had a fever for 3 - 4 days. Stated covid test was negative. Now having h/a's. Stated he's unsure if test was correct. Suggested/explained telehealth visit - agreeable. Telehealth appt scheduled this am @ 1015 AM.

## 2019-08-08 NOTE — Assessment & Plan Note (Signed)
Patient presents today for a telehealth visit for upper respiratory symptoms.  He states that his symptoms began last week.  Started with a fever for 4 days with a temperature as high as somewhere in the 100s.  He states that he has had chills and hot flashes, decreased appetite, malaise, headache rhinorrhea/congestion.  Patient states that he has not tried any medications to improve his symptoms.  He states that he got a Covid test that was negative but is still concerned that he may be positive for coronavirus.  He states that a lot of his symptoms have subsided at this point but continues to have some chills, head pain/pressure, and decreased appetite.  Considering his symptom course the patient likely has an upper respiratory viral infection that is resolving.  Plan: - Counseled the patient regarding conservative management of his upper respiratory infection.  I told him to take scheduled extra strength OTC Tylenol every 6 hours for pain and fever, I discussed using Flonase and Nettie pot for his sinus pain and pressure/congestion. -Discussed using oral meal replacement such as boost to help get calories as he is recovering.  Did have stressed the importance of staying hydrated and getting sufficient nutrition during this time. -I counseled him on the importance of staying quarantine during this time to prevent spread of possible COVID-19 virus.

## 2019-08-08 NOTE — Progress Notes (Signed)
I connected with  [name] on 08/08/19 by a video enabled telemedicine application and verified that I am speaking with the correct person using two identifiers.   I discussed the limitations of evaluation and management by telemedicine. The patient expressed understanding and agreed to proceed.    CC: URI Symptoms  This is a telephone encounter between [employer] and [name] on 08/08/2019 for URI symptoms. The visit was conducted with the patient located at home and [name] at [facility]. The patient's identity was confirmed using their DOB and current address. The patient has consented to being evaluated through a telephone encounter and understands the associated risks (an examination cannot be done and the patient may need to come in for an appointment) / benefits (allows the patient to remain at home, decreasing exposure to coronavirus). I personally spent 15 minutes on medical discussion.    HPI:  [name] is a 64 y.o. with PMH as below.   Please see A&P for assessment of the patient's acute and chronic medical conditions.   Past Medical History:  Diagnosis Date  . Arthritis    due to age  . Cocaine abuse ([place])    quit  . ETOH abuse    quit  . Hx of adenomatous polyp of colon   . Hyperlipidemia   . Hypertension   . 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 Negative for diaphoresis.  HENT: Positive for congestion. Negative for sore throat.   Respiratory: Negative for cough, sputum production and shortness of breath.   Cardiovascular: Negative for chest pain, palpitations and leg swelling.  Gastrointestinal: Positive for nausea. Negative for abdominal pain and vomiting.  Genitourinary: Negative for dysuria.  Musculoskeletal: Negative for joint pain and myalgias.  Neurological: Positive for weakness.       Assessment &  Plan:   See Encounters Tab for problem based charting.  Patient discussed with Dr. Dareen Piano

## 2019-08-08 NOTE — Progress Notes (Signed)
Internal Medicine Clinic Attending  Case discussed with Dr. Helberg at the time of the visit.  We reviewed the resident's history and exam and pertinent patient test results.  I agree with the assessment, diagnosis, and plan of care documented in the resident's note.    

## 2019-08-13 ENCOUNTER — Ambulatory Visit (INDEPENDENT_AMBULATORY_CARE_PROVIDER_SITE_OTHER): Payer: 59 | Admitting: Internal Medicine

## 2019-08-13 ENCOUNTER — Other Ambulatory Visit: Payer: Self-pay

## 2019-08-13 DIAGNOSIS — J069 Acute upper respiratory infection, unspecified: Secondary | ICD-10-CM | POA: Diagnosis not present

## 2019-08-13 NOTE — Telephone Encounter (Signed)
Return call to pt - stated since his last telelhealth appt on 1/21 he has been having problems with his stomach and wondering if the doctor could prescribe something. Stated at first he could not eat and was taking medicines (Theraflu and Ibuprofen) Now he's eating a little better but his stomach "does not feel right". Telehealth appt scheduled for today @ 1515PM.

## 2019-08-13 NOTE — Telephone Encounter (Signed)
Pls contact pt regarding stomach virus- medicine; pt contact [phone]

## 2019-08-13 NOTE — Assessment & Plan Note (Signed)
Patient previously called the clinic on 1/21 for telehealth visit. At the time he was experiencing four days of fevers, chills, decreased appetite, malaise, headaches, rhinorrhea/congestion. He states that he went to get tested for COVID and it was negative. Since his initial telehealth visit his symptoms have improved and he is tolerating some PO intake. He continues to have diarrhea but states that it is starting to form. His primary concern today was intermittent abdominal pain. He states that he has never experienced anything like this before. He is not tried anything over-the-counter. He is wondering what he can take for symptomatic relief.  A/P: - Discussed that overall it is reassuring that his symptoms are improving. Recommended continue symptomatic management. He can use Pepto-Bismol PRN for abdominal pain and diarrhea. He voices understanding.

## 2019-08-13 NOTE — Progress Notes (Signed)
   CC: Viral infection  This is a telephone encounter between [employer] and [employer] on 08/13/2019 for viral infection. The visit was conducted with the patient located at home and [facility] at [facility]. The patient's identity was confirmed using their DOB and current address. The patient has consented to being evaluated through a telephone encounter and understands the associated risks (an examination cannot be done and the patient may need to come in for an appointment) / benefits (allows the patient to remain at home, decreasing exposure to coronavirus). I personally spent 10 minutes on medical discussion.   HPI:  [name] is a 64 y.o. with PMH as below.   Please see A&P for assessment of the patient's acute and chronic medical conditions.   Past Medical History:  Diagnosis Date  . Arthritis    due to age  . Cocaine abuse ([place])    quit  . ETOH abuse    quit  . Hx of adenomatous polyp of colon   . Hyperlipidemia   . Hypertension   . Stroke [facility])    posterior limb, R internal capsule stroke 07/2012   Review of Systems:  Performed and all others negative.  Assessment & Plan:   See Encounters Tab for problem based charting.  Patient discussed with Dr. Lynnae January

## 2019-08-14 NOTE — Progress Notes (Signed)
Internal Medicine Clinic Attending  Case discussed with Dr. Helberg at the time of the visit.  We reviewed the resident's history and exam and pertinent patient test results.  I agree with the assessment, diagnosis, and plan of care documented in the resident's note.    

## 2019-08-15 NOTE — Progress Notes (Signed)
Internal Medicine Clinic Attending  Case discussed with Dr. Coe at the time of the visit.  We reviewed the resident's history and exam and pertinent patient test results.  I agree with the assessment, diagnosis, and plan of care documented in the resident's note.    

## 2019-09-19 ENCOUNTER — Other Ambulatory Visit: Payer: Self-pay | Admitting: Internal Medicine

## 2019-09-19 DIAGNOSIS — N529 Male erectile dysfunction, unspecified: Secondary | ICD-10-CM

## 2019-09-19 MED ORDER — SILDENAFIL CITRATE 100 MG PO TABS: 100.0000 mg | ORAL_TABLET | ORAL | 1 refills | Status: DC | PRN

## 2019-09-19 NOTE — Telephone Encounter (Signed)
Need refill on sildenafil (VIAGRA) 100 MG tablet  ;pt contact [place] ([place]), [place] - [address]

## 2019-10-16 ENCOUNTER — Telehealth: Payer: Self-pay | Admitting: Internal Medicine

## 2019-10-16 DIAGNOSIS — N529 Male erectile dysfunction, unspecified: Secondary | ICD-10-CM

## 2019-10-16 NOTE — Telephone Encounter (Signed)
Needs refill on sildenafil (VIAGRA) 100 MG tablet  ;pt contact [place] ([place]),  - [address]

## 2019-10-16 NOTE — Telephone Encounter (Signed)
Pt just picked up 20 viagra on the 09/20/2019, 20 is supposed to last 30 days, right? Please advise if he can pick up early, you may want to call him.

## 2019-10-17 NOTE — Telephone Encounter (Signed)
Requesting to speak with a nurse about sildenafil (VIAGRA) 100 MG tablet. Please call pt back.

## 2019-10-17 NOTE — Telephone Encounter (Signed)
RTC, patient is currently taking 100mg  of Viagra and wants to know if he can try the Viagra 50mg .  He is insisting the 100mg  is giving him acid reflux.   Pt is requesting the new RX at [facility]. Thanks, SChaplin, RN,BSN

## 2019-10-22 MED ORDER — SILDENAFIL CITRATE 50 MG PO TABS: 50.0000 mg | ORAL_TABLET | ORAL | 1 refills | Status: DC | PRN

## 2019-10-22 NOTE — Telephone Encounter (Signed)
Done. Thanks.

## 2019-11-21 ENCOUNTER — Encounter: Payer: Self-pay | Admitting: *Deleted

## 2019-12-03 ENCOUNTER — Other Ambulatory Visit: Payer: Self-pay | Admitting: Internal Medicine

## 2019-12-03 DIAGNOSIS — I10 Essential (primary) hypertension: Secondary | ICD-10-CM

## 2019-12-04 ENCOUNTER — Other Ambulatory Visit: Payer: Self-pay | Admitting: Internal Medicine

## 2019-12-04 DIAGNOSIS — N529 Male erectile dysfunction, unspecified: Secondary | ICD-10-CM

## 2020-01-01 ENCOUNTER — Other Ambulatory Visit: Payer: Self-pay | Admitting: Internal Medicine

## 2020-01-01 DIAGNOSIS — N529 Male erectile dysfunction, unspecified: Secondary | ICD-10-CM

## 2020-01-21 ENCOUNTER — Other Ambulatory Visit: Payer: Self-pay | Admitting: Student

## 2020-01-21 DIAGNOSIS — N529 Male erectile dysfunction, unspecified: Secondary | ICD-10-CM

## 2020-01-21 MED ORDER — SILDENAFIL CITRATE 50 MG PO TABS: 100.0000 mg | ORAL_TABLET | ORAL | 0 refills | Status: DC | PRN

## 2020-01-21 NOTE — Telephone Encounter (Signed)
Need refill on sildenafil (VIAGRA) 50 MG tablet  Pt wants the 100mg    ;pt contact [place] ([place]), [place] - [address]

## 2020-02-04 ENCOUNTER — Other Ambulatory Visit: Payer: Self-pay

## 2020-02-04 DIAGNOSIS — Z8673 Personal history of transient ischemic attack (TIA), and cerebral infarction without residual deficits: Secondary | ICD-10-CM

## 2020-02-04 MED ORDER — ROSUVASTATIN CALCIUM 20 MG PO TABS: 20.0000 mg | ORAL_TABLET | Freq: Every day | ORAL | 3 refills | Status: DC

## 2020-02-04 NOTE — Telephone Encounter (Signed)
Rtc, lm for rtc 

## 2020-02-04 NOTE — Telephone Encounter (Signed)
Requesting to speak with a nurse about cholesterol med. Please call pt back.

## 2020-02-04 NOTE — Telephone Encounter (Signed)
Pt states pharmacy unable to refill rosuvastatin-will send refill to appropriate team for review.Despina Hidden Cassady7/20/20212:30 PM

## 2020-02-11 ENCOUNTER — Telehealth: Payer: Self-pay | Admitting: *Deleted

## 2020-02-11 NOTE — Telephone Encounter (Signed)
Information was sent through [employer] for [place] for Rosuvastation.  Used [employer] which does not cover meds.  Call to [facility] spoke to Pharmacist.  Will run under [facility] as patient's has recently gotten other meds with this card. Sander Nephew, RN 02/11/2020 9:46 AM.

## 2020-02-21 ENCOUNTER — Encounter: Payer: 59 | Admitting: Student

## 2020-02-27 ENCOUNTER — Encounter: Payer: Self-pay | Admitting: Student

## 2020-02-27 ENCOUNTER — Other Ambulatory Visit: Payer: Self-pay

## 2020-02-27 ENCOUNTER — Ambulatory Visit: Payer: 59 | Admitting: Student

## 2020-02-27 VITALS — BP 127/83 | HR 58 | Temp 98.4°F | Wt 169.1 lb

## 2020-02-27 DIAGNOSIS — M542 Cervicalgia: Secondary | ICD-10-CM | POA: Insufficient documentation

## 2020-02-27 DIAGNOSIS — Z0000 Encounter for general adult medical examination without abnormal findings: Secondary | ICD-10-CM | POA: Diagnosis not present

## 2020-02-27 DIAGNOSIS — N529 Male erectile dysfunction, unspecified: Secondary | ICD-10-CM | POA: Diagnosis not present

## 2020-02-27 DIAGNOSIS — R7303 Prediabetes: Secondary | ICD-10-CM | POA: Diagnosis not present

## 2020-02-27 DIAGNOSIS — I10 Essential (primary) hypertension: Secondary | ICD-10-CM | POA: Diagnosis not present

## 2020-02-27 LAB — POCT GLYCOSYLATED HEMOGLOBIN (HGB A1C): Hemoglobin A1C: 6 % — AB (ref 4.0–5.6)

## 2020-02-27 LAB — GLUCOSE, CAPILLARY: Glucose-Capillary: 111 mg/dL — ABNORMAL HIGH (ref 70–99)

## 2020-02-27 MED ORDER — HYDROCHLOROTHIAZIDE 25 MG PO TABS: 25.0000 mg | ORAL_TABLET | Freq: Every day | ORAL | 0 refills | Status: DC

## 2020-02-27 MED ORDER — SILDENAFIL CITRATE 50 MG PO TABS: 100.0000 mg | ORAL_TABLET | ORAL | 0 refills | Status: DC | PRN

## 2020-02-27 NOTE — Assessment & Plan Note (Signed)
Patient with known erectile dysfunction. Tolerating sildenafil 50 mg PRN. He needs a refill, will send out today.   A/P  Continue PRN Sildenafil 50 mg, 2 doses PRN.

## 2020-02-27 NOTE — Assessment & Plan Note (Signed)
Patient notes intermittent right sided neck pain. Patient states notices that this pain occurs more after working out or heavy lifting at his job. He notes improvement with rest. Denies any radiation of pain down the right arm. Suspected musculoskeletal pain. Low suspicion for cervical radiculopathy.   A/P - Suspect MSK pain - Discussed with patient the importance of stretching prior to exercising - Discussed conservative treatment of rest, warm compresses, and tylenol for pain.  - Will ask if he is still having pain or worsening pain at next visit.

## 2020-02-27 NOTE — Progress Notes (Addendum)
   CC: New PCP Visit - Management of HTN, HLD  HPI:  Mr.Shelia A Guin is a 64 y.o. with a PMHx of HTN, HLD, and CVA who presents for new PCP visit. He states he is taking his medications regularly. He states he works outside often and is trying to stay hydrated by drinking water. He denies any recent illness. He does note right sided intermittent posterior neck pain. He describes the pain as a "crick in his neck." He denies any radiation of pain down his neck and into his arms. He states the pain is worse with movement. He states it improves with rest. He denies any issues passing his bowels or urinating. He notes he is out of his Viagra and needs a refill.   Diet wise, he states he drinks a lot of ginger ale and eats a lot of [place] food, rice, and chips.  Mr. Mcmeen has no other complaints at this time.  Past Medical History:  Diagnosis Date  . Arthritis    due to age  . Cocaine abuse ([place])    quit  . ETOH abuse    quit  . Hx of adenomatous polyp of colon   . Hyperlipidemia   . Hypertension   . Stroke [facility])    posterior limb, R internal capsule stroke 07/2012   Review of Systems:  Review of Systems  Constitutional: Negative for fever.  Respiratory: Negative for shortness of breath.   Cardiovascular: Negative for chest pain.  Musculoskeletal: Positive for neck pain.  All other systems reviewed and are negative.    Physical Exam:  Vitals:   02/27/20 1059  BP: 127/83  Pulse: (!) 58  Temp: 98.4 F (36.9 C)  TempSrc: Oral  SpO2: 98%  Weight: 169 lb 1.6 oz (76.7 kg)   Physical Exam Vitals and nursing note reviewed.  Constitutional:      General: He is not in acute distress.    Appearance: Normal appearance. He is not ill-appearing or toxic-appearing.  HENT:     Head: Atraumatic.  Eyes:     Extraocular Movements: Extraocular movements intact.  Cardiovascular:     Rate and Rhythm: Normal rate and regular rhythm.     Pulses: Normal pulses.     Heart sounds: Normal  heart sounds. No murmur heard.  No gallop.   Pulmonary:     Effort: Pulmonary effort is normal. No respiratory distress.     Breath sounds: Normal breath sounds. No stridor. No wheezing, rhonchi or rales.  Musculoskeletal:     Cervical back: Normal range of motion. No tenderness.  Skin:    General: Skin is warm and dry.  Neurological:     General: No focal deficit present.     Mental Status: He is alert and oriented to person, place, and time.  Psychiatric:        Mood and Affect: Mood normal.        Behavior: Behavior normal.    Assessment & Plan:   See Encounters Tab for problem based charting.  Patient discussed with and seen by Dr. Philipp Ovens

## 2020-02-27 NOTE — Assessment & Plan Note (Signed)
Patient has received both of his COVID vaccines, updated in computer. Will discuss PNA vaccine during patients next visit.

## 2020-02-27 NOTE — Assessment & Plan Note (Addendum)
A1C of 6.2 on 03/2019. Will check A1C today and call patient if worsening A1C. Discussed with patient need to adjust diet and lower carbohydrate as well as sugar intake. Dr. Philipp Ovens and I discussed with the patient the need to decrease the amount of soda's he consumes and that alone can improve A1C significantly.   A/P - Instructed patient to focus on improving diet with decrease in amount of carbohydrates and sugars. Gave patient diabetic diet instructions in discharge paperwork. - A1C of 6.0 today - Will continue to monitor A1C and conservative treatment by improving diet.  - If A1C worsens in the future will consider medication

## 2020-02-27 NOTE — Patient Instructions (Signed)
Thank you for allowing [name] to assist in your care today!  Today we discussed  1) High blood pressure - Continue to take your blood pressure medication and make sure you stay hydrated at work  2) Prediabetes - We will recheck your labs today and I will call and update you.  - Below I have attached a list of foods to avoid as a prediabetic  3) Neck Pain - Please make sure to stretch prior to exercising and working outside.  - Rest, warm compresses and tylenol for any pain.   If you have any questions or concerns please call our office!   Diabetes Mellitus and Nutrition, Adult When you have diabetes (diabetes mellitus), it is very important to have healthy eating habits because your blood sugar (glucose) levels are greatly affected by what you eat and drink. Eating healthy foods in the appropriate amounts, at about the same times every day, can help you:  Control your blood glucose.  Lower your risk of heart disease.  Improve your blood pressure.  Reach or maintain a healthy weight. Every person with diabetes is different, and each person has different needs for a meal plan. Your health care provider may recommend that you work with a diet and nutrition specialist (dietitian) to make a meal plan that is best for you. Your meal plan may vary depending on factors such as:  The calories you need.  The medicines you take.  Your weight.  Your blood glucose, blood pressure, and cholesterol levels.  Your activity level.  Other health conditions you have, such as heart or kidney disease. How do carbohydrates affect me? Carbohydrates, also called carbs, affect your blood glucose level more than any other type of food. Eating carbs naturally raises the amount of glucose in your blood. Carb counting is a method for keeping track of how many carbs you eat. Counting carbs is important to keep your blood glucose at a healthy level, especially if you use insulin or take certain oral diabetes  medicines. It is important to know how many carbs you can safely have in each meal. This is different for every person. Your dietitian can help you calculate how many carbs you should have at each meal and for each snack. Foods that contain carbs include:  Bread, cereal, rice, pasta, and crackers.  Potatoes and corn.  Peas, beans, and lentils.  Milk and yogurt.  Fruit and juice.  Desserts, such as cakes, cookies, ice cream, and candy. How does alcohol affect me? Alcohol can cause a sudden decrease in blood glucose (hypoglycemia), especially if you use insulin or take certain oral diabetes medicines. Hypoglycemia can be a life-threatening condition. Symptoms of hypoglycemia (sleepiness, dizziness, and confusion) are similar to symptoms of having too much alcohol. If your health care provider says that alcohol is safe for you, follow these guidelines:  Limit alcohol intake to no more than 1 drink per day for nonpregnant women and 2 drinks per day for men. One drink equals 12 oz of beer, 5 oz of wine, or 1 oz of hard liquor.  Do not drink on an empty stomach.  Keep yourself hydrated with water, diet soda, or unsweetened iced tea.  Keep in mind that regular soda, juice, and other mixers may contain a lot of sugar and must be counted as carbs. What are tips for following this plan?  Reading food labels  Start by checking the serving size on the "Nutrition Facts" label of packaged foods and drinks. The amount  of calories, carbs, fats, and other nutrients listed on the label is based on one serving of the item. Many items contain more than one serving per package.  Check the total grams (g) of carbs in one serving. You can calculate the number of servings of carbs in one serving by dividing the total carbs by 15. For example, if a food has 30 g of total carbs, it would be equal to 2 servings of carbs.  Check the number of grams (g) of saturated and trans fats in one serving. Choose foods  that have low or no amount of these fats.  Check the number of milligrams (mg) of salt (sodium) in one serving. Most people should limit total sodium intake to less than 2,300 mg per day.  Always check the nutrition information of foods labeled as "low-fat" or "nonfat". These foods may be higher in added sugar or refined carbs and should be avoided.  Talk to your dietitian to identify your daily goals for nutrients listed on the label. Shopping  Avoid buying canned, premade, or processed foods. These foods tend to be high in fat, sodium, and added sugar.  Shop around the outside edge of the grocery store. This includes fresh fruits and vegetables, bulk grains, fresh meats, and fresh dairy. Cooking  Use low-heat cooking methods, such as baking, instead of high-heat cooking methods like deep frying.  Cook using healthy oils, such as olive, canola, or sunflower oil.  Avoid cooking with butter, cream, or high-fat meats. Meal planning  Eat meals and snacks regularly, preferably at the same times every day. Avoid going long periods of time without eating.  Eat foods high in fiber, such as fresh fruits, vegetables, beans, and whole grains. Talk to your dietitian about how many servings of carbs you can eat at each meal.  Eat 4-6 ounces (oz) of lean protein each day, such as lean meat, chicken, fish, eggs, or tofu. One oz of lean protein is equal to: ? 1 oz of meat, chicken, or fish. ? 1 egg. ?  cup of tofu.  Eat some foods each day that contain healthy fats, such as avocado, nuts, seeds, and fish. Lifestyle  Check your blood glucose regularly.  Exercise regularly as told by your health care provider. This may include: ? 150 minutes of moderate-intensity or vigorous-intensity exercise each week. This could be brisk walking, biking, or water aerobics. ? Stretching and doing strength exercises, such as yoga or weightlifting, at least 2 times a week.  Take medicines as told by your  health care provider.  Do not use any products that contain nicotine or tobacco, such as cigarettes and e-cigarettes. If you need help quitting, ask your health care provider.  Work with a Social worker or diabetes educator to identify strategies to manage stress and any emotional and social challenges. Questions to ask a health care provider  Do I need to meet with a diabetes educator?  Do I need to meet with a dietitian?  What number can I call if I have questions?  When are the best times to check my blood glucose? Where to find more information:  American Diabetes Association: diabetes.org  Academy of Nutrition and Dietetics: www.eatright.[employer] of Diabetes and Digestive and Kidney Diseases (NIH): [employer] Summary  A healthy meal plan will help you control your blood glucose and maintain a healthy lifestyle.  Working with a diet and nutrition specialist (dietitian) can help you make a meal plan that is best for you.  Keep in mind that carbohydrates (carbs) and alcohol have immediate effects on your blood glucose levels. It is important to count carbs and to use alcohol carefully. This information is not intended to replace advice given to you by your health care provider. Make sure you discuss any questions you have with your health care provider. Document Revised: 06/16/2017 Document Reviewed: 08/08/2016 Elsevier Patient Education  2020 [employer].

## 2020-02-27 NOTE — Assessment & Plan Note (Addendum)
Blood pressure of 127/83 today. Patient compliant with medication regiemn of HCTZ and Amlodipine  A/P - BMP repeated today, will update patient of abnormal results - Continue current regiment of amlodipine 10 mg daily and HCTZ 25 mg daily.  - Refilled HCTZ during his visit

## 2020-02-28 LAB — BMP8+ANION GAP
Anion Gap: 16 mmol/L (ref 10.0–18.0)
BUN/Creatinine Ratio: 15 (ref 10–24)
BUN: 11 mg/dL (ref 8–27)
CO2: 25 mmol/L (ref 20–29)
Calcium: 9.6 mg/dL (ref 8.6–10.2)
Chloride: 99 mmol/L (ref 96–106)
Creatinine, Ser: 0.73 mg/dL — ABNORMAL LOW (ref 0.76–1.27)
GFR calc Af Amer: 113 mL/min/{1.73_m2} (ref >59)
GFR calc non Af Amer: 97 mL/min/{1.73_m2} (ref >59)
Glucose: 103 mg/dL — ABNORMAL HIGH (ref 65–99)
Potassium: 3.4 mmol/L — ABNORMAL LOW (ref 3.5–5.2)
Sodium: 140 mmol/L (ref 134–144)

## 2020-03-03 NOTE — Progress Notes (Signed)
Internal Medicine Clinic Attending  I saw and evaluated the patient.  I personally confirmed the key portions of the history and exam documented by Dr. Katsadouros and I reviewed pertinent patient test results.  The assessment, diagnosis, and plan were formulated together and I agree with the documentation in the resident's note.  

## 2020-04-28 ENCOUNTER — Other Ambulatory Visit: Payer: Self-pay

## 2020-04-28 DIAGNOSIS — N529 Male erectile dysfunction, unspecified: Secondary | ICD-10-CM

## 2020-04-28 MED ORDER — SILDENAFIL CITRATE 50 MG PO TABS: 100.0000 mg | ORAL_TABLET | ORAL | 0 refills | Status: DC | PRN

## 2020-06-03 ENCOUNTER — Other Ambulatory Visit: Payer: Self-pay | Admitting: Internal Medicine

## 2020-06-03 DIAGNOSIS — N529 Male erectile dysfunction, unspecified: Secondary | ICD-10-CM

## 2020-06-29 ENCOUNTER — Other Ambulatory Visit: Payer: Self-pay | Admitting: Student

## 2020-06-29 DIAGNOSIS — N529 Male erectile dysfunction, unspecified: Secondary | ICD-10-CM

## 2020-07-18 ENCOUNTER — Other Ambulatory Visit: Payer: Self-pay | Admitting: Internal Medicine

## 2020-07-18 DIAGNOSIS — N529 Male erectile dysfunction, unspecified: Secondary | ICD-10-CM

## 2020-07-20 ENCOUNTER — Telehealth: Payer: Self-pay

## 2020-07-20 NOTE — Telephone Encounter (Signed)
sildenafil (VIAGRA) 50 MG tablet, REFILL REQUEST @  [id number] - [place] ([place]), [place] - [address] Phone:  [phone]  Fax:  [phone]

## 2020-07-20 NOTE — Telephone Encounter (Signed)
Rx for 20 tabs sent today. Kinnie Feil, BSN, RN-BC

## 2020-08-13 ENCOUNTER — Other Ambulatory Visit: Payer: Self-pay | Admitting: Internal Medicine

## 2020-08-13 ENCOUNTER — Other Ambulatory Visit: Payer: Self-pay

## 2020-08-13 DIAGNOSIS — N529 Male erectile dysfunction, unspecified: Secondary | ICD-10-CM

## 2020-08-13 MED ORDER — SILDENAFIL CITRATE 50 MG PO TABS: ORAL_TABLET | ORAL | 0 refills | Status: DC

## 2020-08-13 NOTE — Telephone Encounter (Signed)
Pt is requesting  His sildenafil (VIAGRA) 50 MG tablet sen to [place] ([place]), [place] - [address] Phone:  [phone]  Fax:  [phone]

## 2020-08-17 ENCOUNTER — Ambulatory Visit
Admission: RE | Admit: 2020-08-17 | Discharge: 2020-08-17 | Disposition: A | Discharge status: Home / Self Care | Payer: No Typology Code available for payment source | Source: Ambulatory Visit | Attending: Nurse Practitioner | Admitting: Nurse Practitioner

## 2020-08-17 ENCOUNTER — Other Ambulatory Visit: Payer: Self-pay

## 2020-08-17 ENCOUNTER — Other Ambulatory Visit: Payer: Self-pay | Admitting: Nurse Practitioner

## 2020-08-17 DIAGNOSIS — M25511 Pain in right shoulder: Secondary | ICD-10-CM

## 2020-08-17 DIAGNOSIS — I10 Essential (primary) hypertension: Secondary | ICD-10-CM

## 2020-08-17 MED ORDER — AMLODIPINE BESYLATE 10 MG PO TABS: 10.0000 mg | ORAL_TABLET | Freq: Every day | ORAL | 3 refills | Status: DC

## 2020-08-17 NOTE — Telephone Encounter (Signed)
Need refill on amLODipine (NORVASC) 10 MG tablet ;pt contact [place] ([place]), [place] - [address]   Patient is completely out since Friday

## 2020-09-01 ENCOUNTER — Ambulatory Visit: Payer: 59 | Admitting: Family Medicine

## 2020-09-02 ENCOUNTER — Other Ambulatory Visit: Payer: Self-pay | Admitting: Student

## 2020-09-02 DIAGNOSIS — I10 Essential (primary) hypertension: Secondary | ICD-10-CM

## 2020-09-03 ENCOUNTER — Other Ambulatory Visit: Payer: Self-pay

## 2020-09-03 DIAGNOSIS — N529 Male erectile dysfunction, unspecified: Secondary | ICD-10-CM

## 2020-09-03 NOTE — Telephone Encounter (Signed)
Pt is requesting his sildenafil (VIAGRA) 50 MG tablet sent to  [id number] - [place] ([place]), [place] - [address] Phone:  [phone]  Fax:  [phone]     

## 2020-09-03 NOTE — Telephone Encounter (Signed)
Will hold off on refill until he has his appointment with me to discuss this further. Thank you  Dr. Raliegh Ip.

## 2020-09-04 ENCOUNTER — Other Ambulatory Visit: Payer: Self-pay | Admitting: Internal Medicine

## 2020-09-04 DIAGNOSIS — N529 Male erectile dysfunction, unspecified: Secondary | ICD-10-CM

## 2020-09-07 ENCOUNTER — Encounter: Payer: Self-pay | Admitting: Student

## 2020-09-07 ENCOUNTER — Ambulatory Visit: Payer: 59 | Admitting: Student

## 2020-09-07 ENCOUNTER — Other Ambulatory Visit: Payer: Self-pay

## 2020-09-07 DIAGNOSIS — K219 Gastro-esophageal reflux disease without esophagitis: Secondary | ICD-10-CM

## 2020-09-07 DIAGNOSIS — I10 Essential (primary) hypertension: Secondary | ICD-10-CM | POA: Diagnosis not present

## 2020-09-07 DIAGNOSIS — N529 Male erectile dysfunction, unspecified: Secondary | ICD-10-CM | POA: Diagnosis not present

## 2020-09-07 MED ORDER — HYDROCHLOROTHIAZIDE 25 MG PO TABS: 25.0000 mg | ORAL_TABLET | Freq: Every day | ORAL | 3 refills | Status: DC

## 2020-09-07 MED ORDER — SILDENAFIL CITRATE 50 MG PO TABS: ORAL_TABLET | ORAL | 0 refills | Status: DC

## 2020-09-07 MED ORDER — OMEPRAZOLE 20 MG PO CPDR: 20.0000 mg | DELAYED_RELEASE_CAPSULE | Freq: Two times a day (BID) | ORAL | 1 refills | Status: DC

## 2020-09-07 NOTE — Progress Notes (Signed)
CC: acid reflux, erectile dysfunction, medication refills  HPI:  [name] is a 65 y.o. male with a past medical history stated below and presents today for acid reflux, erectile dysfunction, medication refills. Please see problem based assessment and plan for additional details.  Past Medical History:  Diagnosis Date  . Arthritis    due to age  . Cocaine abuse ([place])    quit  . ETOH abuse    quit  . Hx of adenomatous polyp of colon   . Hyperlipidemia   . Hypertension   . Stroke [facility])    posterior limb, R internal capsule stroke 07/2012    Current Outpatient Medications on File Prior to Visit  Medication Sig Dispense Refill  . amLODipine (NORVASC) 10 MG tablet Take 1 tablet (10 mg total) by mouth daily. 90 tablet 3  . aspirin (EQ ASPIRIN ADULT LOW DOSE) 81 MG EC tablet TAKE ONE  BY MOUTH EVERY DAY 30 tablet 11  . Misc Natural Products (GINSENG COMPLEX PO) Take by mouth at bedtime.    . mupirocin ointment (BACTROBAN) 2 % Place 1 application into the nose 2 (two) times daily. For 5 days (Patient not taking: Reported on 03/14/2019) 5 g 0  . Omega-3 Fatty Acids (FISH OIL) 1000 MG CAPS Take by mouth at bedtime.    . rosuvastatin (CRESTOR) 20 MG tablet Take 1 tablet (20 mg total) by mouth daily. 90 tablet 3   Current Facility-Administered Medications on File Prior to Visit  Medication Dose Route Frequency Provider Last Rate Last Admin  . 0.9 %  sodium chloride infusion  500 mL Intravenous Once Gatha Mayer, MD        Family History  Problem Relation Age of Onset  . Pancreatic cancer Father   . Colon cancer Neg Hx   . Esophageal cancer Neg Hx   . Rectal cancer Neg Hx   . Stomach cancer Neg Hx     Social History   Socioeconomic History  . Marital status: Single    Spouse name: Not on file  . Number of children: Not on file  . Years of education: Not on file  . Highest education level: Not on file  Occupational History  . Not on file  Tobacco Use  . Smoking  status: Former Smoker    Quit date: 07/18/2012    Years since quitting: 8.1  . Smokeless tobacco: Never Used  Vaping Use  . Vaping Use: Never used  Substance and Sexual Activity  . Alcohol use: No    Alcohol/week: 0.0 standard drinks    Comment: quit 2011  . Drug use: No    Comment: h/o cocaine abuse  . Sexual activity: Not on file  Other Topics Concern  . Not on file  Social History Narrative  . Not on file   Social Determinants of Health   Financial Resource Strain: Not on file  Food Insecurity: Not on file  Transportation Needs: Not on file  Physical Activity: Not on file  Stress: Not on file  Social Connections: Not on file  Intimate Partner Violence: Not on file    Review of Systems: ROS negative except for what is noted on the assessment and plan.  Vitals:   09/07/20 1507  BP: 129/78  Pulse: 66  Temp: 98.1 F (36.7 C)  TempSrc: Oral  SpO2: 99%  Weight: 169 lb 6.4 oz (76.8 kg)  Height: 5\' 8"  (1.727 m)     Physical Exam: Constitutional: well-appearing, sitting  in bed comfortably, in no acute distress HENT: normocephalic atraumatic Eyes: conjunctiva non-erythematous Neck: supple Cardiovascular: regular rate Pulmonary/Chest: normal work of breathing on room air MSK: normal bulk and tone Neurological: alert & oriented x 3 Skin: warm and dry Psych: normal mood   Assessment & Plan:   See Encounters Tab for problem based charting.  Patient discussed with Dr. Caffie Damme, D.O. [facility], PGY-1 Pager: [phone], Phone: [phone] Date 09/07/2020 Time 7:16 PM

## 2020-09-07 NOTE — Assessment & Plan Note (Signed)
Assessment: Patient endorses symptoms of GERD similar to symptoms he had when treated prior. Does not appear to have been addressed at his visits since 2015. Patient notes his symptoms improved and he no longer felt as though he needed the medication at that time. He denies chest pain, shortness of breath at rest or with exertion. Denies weight loss, night sweats. While recommendations are further GI evaluation for patient's over the age of 60, his symptoms started prior to this and he states the symptoms are the same as before. Will start omeprazole 40 mg daily.  Instructed patient if no improvement with PPI or new symptoms occur (chest pain, weight loss, night sweats) he should schedule an appointment to be seen in clinic with intention of further work up for his GERD symptoms.   Plan: -omeprazole 20 mg BID -if no improvement or new symptoms as described above, further GI evaluation including EGD.

## 2020-09-07 NOTE — Assessment & Plan Note (Addendum)
Assessment: Patient continues to use sildenafil 100 mg, he states high sexual drive and uses every other day. He would like more frequent refills as it can be difficult to refill every other month.   Discussed with him we will increase to 60 tabs (4 months supply). Also discussed with him importance of informing us if side effects occur such as chest pain. Patient has no history of CAD. Is not on nitroglycerin. If patient does become symptomatic, will plan to decrease dose to 50 mg.  Plan: -continue viagra 100 mg PRN for ED. Refilled for 60 tabs

## 2020-09-07 NOTE — Patient Instructions (Signed)
Thank you, [name] for allowing [name] to provide your care today. Today we discussed  Acid Reflux - At one point you were taking medications for this, we will be restarting you on a acid reducing medication to help with this. If you continue to have these symptoms despite starting this medication please call our office. If you notice yourself having weight loss, night sweats, please give our office a call.   I have written you for your medication refills  I have ordered the following labs for you:  Lab Orders  No laboratory test(s) ordered today   Referrals ordered today:   Referral Orders  No referral(s) requested today     I have ordered the following medication/changed the following medications:   Stop the following medications: Medications Discontinued During This Encounter  Medication Reason  . hydrochlorothiazide (HYDRODIURIL) 25 MG tablet Reorder  . sildenafil (VIAGRA) 50 MG tablet      Start the following medications: Meds ordered this encounter  Medications  . hydrochlorothiazide (HYDRODIURIL) 25 MG tablet    Sig: Take 1 tablet (25 mg total) by mouth daily.    Dispense:  90 tablet    Refill:  3    Replace old script  . sildenafil (VIAGRA) 50 MG tablet    Sig: TAKE 2 TABLETS BY MOUTH AS NEEDED FOR ERECTILE DYSFUNCTION (DO NOT TAKE MORE THAN 100 MG PER DAY)    Dispense:  60 tablet    Refill:  0  . omeprazole (PRILOSEC) 20 MG capsule    Sig: Take 1 capsule (20 mg total) by mouth 2 (two) times daily.    Dispense:  90 capsule    Refill:  1     Follow up: 6 months    Remember: If you have any worsening acid reflux or notice new symptoms please call our office and schedule an appointment  Should you have any questions or concerns please call the internal medicine clinic at [phone].     Sanjuana Letters, D.O. [facility]

## 2020-09-10 NOTE — Progress Notes (Signed)
Internal Medicine Clinic Attending  Case discussed with Dr. Katsadouros  At the time of the visit.  We reviewed the resident's history and exam and pertinent patient test results.  I agree with the assessment, diagnosis, and plan of care documented in the resident's note.  

## 2020-09-11 NOTE — Addendum Note (Signed)
Addended by: Riesa Pope on: 09/11/2020 08:29 AM   Modules accepted: Level of Service

## 2020-10-05 ENCOUNTER — Other Ambulatory Visit: Payer: Self-pay

## 2020-10-05 ENCOUNTER — Ambulatory Visit: Payer: 59 | Admitting: Student

## 2020-10-05 ENCOUNTER — Encounter: Payer: Self-pay | Admitting: Student

## 2020-10-05 DIAGNOSIS — I10 Essential (primary) hypertension: Secondary | ICD-10-CM | POA: Diagnosis not present

## 2020-10-05 DIAGNOSIS — G4486 Cervicogenic headache: Secondary | ICD-10-CM

## 2020-10-05 DIAGNOSIS — H1132 Conjunctival hemorrhage, left eye: Secondary | ICD-10-CM

## 2020-10-05 DIAGNOSIS — M542 Cervicalgia: Secondary | ICD-10-CM | POA: Diagnosis not present

## 2020-10-05 DIAGNOSIS — R519 Headache, unspecified: Secondary | ICD-10-CM | POA: Insufficient documentation

## 2020-10-05 NOTE — Progress Notes (Signed)
   CC: Left eye redness  HPI:  [name] is a 65 y.o. with past medical history of hypertension, past CVA, who presented to the clinic for 1 week of left eye redness.  Please see problem based charting for further detail  Past Medical History:  Diagnosis Date  . Arthritis    due to age  . Cocaine abuse ([place])    quit  . ETOH abuse    quit  . Hx of adenomatous polyp of colon   . Hyperlipidemia   . Hypertension   . Stroke [facility])    posterior limb, R internal capsule stroke 07/2012   Review of Systems:  Review of Systems  Eyes: Positive for redness. Negative for photophobia, pain and discharge.    Physical Exam:  Vitals:   10/05/20 1453 10/05/20 1535  BP: (!) 160/91 136/90  Pulse: 75 70  Temp: 98 F (36.7 C)   SpO2: 99%   Weight: 163 lb 1.6 oz (74 kg)   Height: 5\' 7"  (1.702 m)    Physical Exam Constitutional:      General: He is not in acute distress. HENT:     Head: Normocephalic.  Eyes:     Pupils: Pupils are equal, round, and reactive to light.     Comments: PERRLA Left conjunctival erythema A small hematoma noted in the superior aspect of eyeball. No hypopyon or hyphema   Pulmonary:     Effort: Pulmonary effort is normal. No respiratory distress.  Musculoskeletal:     Cervical back: Normal range of motion. Tenderness present. No rigidity.  Neurological:     Mental Status: He is alert.  Psychiatric:        Mood and Affect: Mood normal.       Assessment & Plan:   See Encounters Tab for problem based charting.  Patient discussed with Dr. Evette Doffing

## 2020-10-05 NOTE — Assessment & Plan Note (Addendum)
Initial blood pressure 160/90, repeat 136/90.  This could be related to his ongoing headache.  His blood pressure was well controlled based on the last 2 visits.  Would not make any adjustment to his medication right now.  We will recheck his blood pressure in a few months

## 2020-10-05 NOTE — Assessment & Plan Note (Addendum)
Patient reports a work-related injury in January.  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  He then saw [facility] orthopedics.  States that he has imaging done but was not sure the results.  He states that the orthopedics thought this is likely muscle strain and recommended physical therapy.  States that PT helps.  They also prescribed muscle relaxant and Tylenol.  Patient reports headache associated with his neck pain.  States that his headache locates in the back of the head.  His orthopedics thought that the headache is related to his neck muscle tightness.   -Advised patient to continue follow-up with Ortho and physical therapy. -Continue methocarbamol and Tylenol -Patient signed the release of information form to get records from his orthopedic

## 2020-10-05 NOTE — Patient Instructions (Addendum)
Mr. Fleet,  It is a pleasure seeing you in the clinic today.   Your left eye redness is likely due to a subconjunctival hematoma, which is a broken vessel in the eye.  This should resolve in the next 2-4 weeks.  If your symptoms are not getting better, please let us know and we will send you to an eye doctor.  Continue to follow-up with your orthopedic for your muscle strain.  Continue physical therapy as scheduled.  Please let us know or go to the ED if you have worsening headache, dizziness, nausea, vomiting, or change in your vision.  Take care  Return when

## 2020-10-05 NOTE — Assessment & Plan Note (Addendum)
Patient reports left conjunctival erythema 1 week ago.  He denies trauma, vision change, foreign body sensation, photophobia, discharge, pain or nausea.  He is not wearing any contact lenses.  He denies any allergy.  Assessment and plan Physical exam and history consistent with subconjunctival hematoma.  This should resolve in the next 2-4 weeks.  Advised patient to call the clinic if symptoms not better, he will then need an ophthalmologist referral.

## 2020-10-06 NOTE — Progress Notes (Signed)
Internal Medicine Clinic Attending  I saw and evaluated the patient.  I personally confirmed the key portions of the history and exam documented by Dr. Alfonse Spruce and I reviewed pertinent patient test results.  The assessment, diagnosis, and plan were formulated together and I agree with the documentation in the resident's note.   Left eye subconjunctival hemorrhage with an associated small clot on the sclera in the area of the hemorrhage. I was able to get good visualization of the sclera all around the clot. I do not see any sign of sharp trauma, no damage to the sclera, the globe, and no retained foreign body. I anticipate this will improve with supportive care. He can use artificial tears for any future mild discomfort.

## 2020-10-16 ENCOUNTER — Other Ambulatory Visit: Payer: Self-pay | Admitting: Student

## 2020-10-16 DIAGNOSIS — N529 Male erectile dysfunction, unspecified: Secondary | ICD-10-CM

## 2020-10-16 NOTE — Telephone Encounter (Signed)
MEDICATION REFILL  sildenafil (VIAGRA) 50 MG tablet   [place] ([place]), [place] - [address] Phone:  [phone]  Fax:  [phone]

## 2020-10-18 MED ORDER — SILDENAFIL CITRATE 50 MG PO TABS: ORAL_TABLET | ORAL | 0 refills | Status: DC

## 2020-11-02 ENCOUNTER — Other Ambulatory Visit: Payer: Self-pay | Admitting: Internal Medicine

## 2020-11-02 DIAGNOSIS — Z8673 Personal history of transient ischemic attack (TIA), and cerebral infarction without residual deficits: Secondary | ICD-10-CM

## 2020-11-10 ENCOUNTER — Other Ambulatory Visit: Payer: Self-pay | Admitting: Internal Medicine

## 2020-11-10 DIAGNOSIS — Z8673 Personal history of transient ischemic attack (TIA), and cerebral infarction without residual deficits: Secondary | ICD-10-CM

## 2020-11-10 NOTE — Telephone Encounter (Signed)
Need refill on rosuvastatin (CRESTOR) 20 MG tablet  ,pt contact [place] ([place]), [place] - [address]

## 2020-12-03 ENCOUNTER — Other Ambulatory Visit: Payer: Self-pay

## 2020-12-03 ENCOUNTER — Ambulatory Visit: Payer: 59 | Admitting: Student

## 2020-12-03 ENCOUNTER — Encounter: Payer: Self-pay | Admitting: Student

## 2020-12-03 VITALS — BP 136/96 | HR 87 | Temp 98.3°F | Ht 68.0 in | Wt 164.3 lb

## 2020-12-03 DIAGNOSIS — M545 Low back pain, unspecified: Secondary | ICD-10-CM

## 2020-12-03 MED ORDER — MELOXICAM 15 MG PO TABS: 15.0000 mg | ORAL_TABLET | Freq: Every day | ORAL | 0 refills | Status: AC

## 2020-12-03 MED ORDER — METHOCARBAMOL 500 MG PO TABS: 500.0000 mg | ORAL_TABLET | Freq: Three times a day (TID) | ORAL | 0 refills | Status: AC | PRN

## 2020-12-03 NOTE — Patient Instructions (Addendum)
[name],  It was nice seeing you in the clinic today.  Your lower back pain is likely due to muscle spasm.  I sent a short course of muscle relaxant and pain medication to your pharmacy.  Your pain will take at least a few days to get better.  Please call the clinic if your pain does not get better next week.  Take care  Dr. Alfonse Spruce

## 2020-12-03 NOTE — Progress Notes (Signed)
   CC: Right lower back pain  HPI:  [name] is a 65 y.o. with past medical history of hypertension, hyperlipidemia, CVA, who presented to the clinic for 1 day of severe lower back pain.  Please see problem based charting for details  Past Medical History:  Diagnosis Date  . Arthritis    due to age  . Cocaine abuse ([place])    quit  . ETOH abuse    quit  . Hx of adenomatous polyp of colon   . Hyperlipidemia   . Hypertension   . Stroke [facility])    posterior limb, R internal capsule stroke 07/2012   Review of Systems: As per HPI  Physical Exam:  Vitals:   12/03/20 1450  BP: (!) 136/96  Pulse: 87  Temp: 98.3 F (36.8 C)  TempSrc: Oral  SpO2: 97%  Weight: 164 lb 4.8 oz (74.5 kg)  Height: 5\' 8"  (1.727 m)   Physical Exam Constitutional:      General: He is in acute distress.     Appearance: He is not toxic-appearing.  HENT:     Head: Normocephalic.  Eyes:     General: No scleral icterus.       Right eye: No discharge.        Left eye: No discharge.     Conjunctiva/sclera: Conjunctivae normal.  Musculoskeletal:     Comments: Pain to palpation of the right lower back, around the quadratus lumborum muscle.  There is hypertonicity of the quadratus muscle palpated.  No change of the overlying skin, no bruises or hematoma.  No pain to palpation at the spinous process.  No pain to palpation around the ribs area anterior and posterior.  Left lower back is nontender to palpation.  No muscle hypertonicity.  Neurological:     Mental Status: He is alert.  Psychiatric:        Mood and Affect: Mood normal.        Thought Content: Thought content normal.        Judgment: Judgment normal.     Assessment & Plan:   See Encounters Tab for problem based charting.  Patient discussed with Dr. Philipp Ovens

## 2020-12-03 NOTE — Assessment & Plan Note (Addendum)
Patient presents to clinic for an acute right lower back pain that started 1 day ago.  States that he was pulling a large trailer today before and starting to feel pain on his right lower back the next day.  Denies any trauma, or fall.  Described pain as sharp and throbbing, rated 10/10, denies radiation, better with Biofreeze, worse with laying on the right side or movement.  States that heat pad and Tylenol did not help much.  Denies fevers or chills.  Assessment and plan Physical exam revealed muscle hypertonicity around the quadratus lumborum on the right.  No red flag such as fever, chills, bruising, hematoma, midline tenderness, or urinary issue.  His back pain is likely due to muscle spasm.  -Start meloxicam 15 mg daily for 7 days -Methocarbamol 500 mg 3 times daily as needed for 7 days -He can also heat pad or ice as needed. -Patient will call clinic if pain does not get better in the next few days.

## 2020-12-07 NOTE — Progress Notes (Signed)
Internal Medicine Clinic Attending  Case discussed with Dr. Nguyen  At the time of the visit.  We reviewed the resident's history and exam and pertinent patient test results.  I agree with the assessment, diagnosis, and plan of care documented in the resident's note. 

## 2021-01-13 ENCOUNTER — Other Ambulatory Visit: Payer: Self-pay | Admitting: Orthopedic Surgery

## 2021-01-21 ENCOUNTER — Telehealth: Payer: Self-pay

## 2021-01-21 NOTE — Telephone Encounter (Signed)
I agree with your plan, we will see him on the 11th!

## 2021-01-21 NOTE — Telephone Encounter (Signed)
Patient called back. C/o left sided groin pain x 2-3 days. No changes in urination, he does not feel any bulging at site. No recent heavy lifting. First available appt given with PCP for 7/11 at 2:15.

## 2021-01-21 NOTE — Telephone Encounter (Signed)
Requesting an appt for groin pain. No appt available to today.

## 2021-01-21 NOTE — Telephone Encounter (Signed)
RTC, VM obtained and message left that triage nurse was returning his call [place], RN,BSN

## 2021-01-25 ENCOUNTER — Ambulatory Visit: Payer: 59 | Admitting: Student

## 2021-01-25 DIAGNOSIS — R1032 Left lower quadrant pain: Secondary | ICD-10-CM | POA: Diagnosis not present

## 2021-01-25 NOTE — Progress Notes (Signed)
CC: Left Groin Pain  HPI:  [name] is a 65 y.o. male with a past medical history stated below and presents today for left groin pain. Please see problem based assessment and plan for additional details.  Past Medical History:  Diagnosis Date   Arthritis    due to age   Cocaine abuse ([place])    quit   ETOH abuse    quit   Hx of adenomatous polyp of colon    Hyperlipidemia    Hypertension    Stroke [facility])    posterior limb, R internal capsule stroke 07/2012    Current Outpatient Medications on File Prior to Visit  Medication Sig Dispense Refill   amLODipine (NORVASC) 10 MG tablet Take 1 tablet (10 mg total) by mouth daily. 90 tablet 3   aspirin (EQ ASPIRIN ADULT LOW DOSE) 81 MG EC tablet TAKE ONE  BY MOUTH EVERY DAY 30 tablet 11   hydrochlorothiazide (HYDRODIURIL) 25 MG tablet Take 1 tablet (25 mg total) by mouth daily. 90 tablet 3   Misc Natural Products (GINSENG COMPLEX PO) Take by mouth at bedtime.     Omega-3 Fatty Acids (FISH OIL) 1000 MG CAPS Take by mouth at bedtime.     omeprazole (PRILOSEC) 20 MG capsule Take 1 capsule (20 mg total) by mouth 2 (two) times daily. 90 capsule 1   rosuvastatin (CRESTOR) 20 MG tablet Take 1 tablet by mouth daily 90 tablet 0   sildenafil (VIAGRA) 50 MG tablet TAKE 2 TABLETS BY MOUTH AS NEEDED FOR ERECTILE DYSFUNCTION (DO NOT TAKE MORE THAN 100 MG PER DAY) 60 tablet 0   Current Facility-Administered Medications on File Prior to Visit  Medication Dose Route Frequency Provider Last Rate Last Admin   0.9 %  sodium chloride infusion  500 mL Intravenous Once Gatha Mayer, MD       Family History  Problem Relation Age of Onset   Pancreatic cancer Father    Colon cancer Neg Hx    Esophageal cancer Neg Hx    Rectal cancer Neg Hx    Stomach cancer Neg Hx    Social History   Socioeconomic History   Marital status: Single    Spouse name: Not on file   Number of children: Not on file   Years of education: Not on file   Highest  education level: Not on file  Occupational History   Not on file  Tobacco Use   Smoking status: Former    Pack years: 0.00    Types: Cigarettes    Quit date: 07/18/2012    Years since quitting: 8.5   Smokeless tobacco: Never  Vaping Use   Vaping Use: Never used  Substance and Sexual Activity   Alcohol use: No    Alcohol/week: 0.0 standard drinks    Comment: quit 2011   Drug use: No    Comment: h/o cocaine abuse   Sexual activity: Not on file  Other Topics Concern   Not on file  Social History Narrative   Not on file   Social Determinants of Health   Financial Resource Strain: Not on file  Food Insecurity: Not on file  Transportation Needs: Not on file  Physical Activity: Not on file  Stress: Not on file  Social Connections: Not on file  Intimate Partner Violence: Not on file   Review of Systems: ROS negative except for what is noted on the assessment and plan.  Vitals:   01/25/21 1421  BP: Marland Kitchen)  146/95  Pulse: 74  Temp: 98.1 F (36.7 C)  TempSrc: Oral  SpO2: 99%  Weight: 168 lb 12.8 oz (76.6 kg)  Height: 5\' 8"  (1.727 m)   Physical Exam: Constitutional: well appearing, in no acute distress. HENT: normocephalic atraumatic Eyes: conjunctiva non-erythematous Neck: supple Cardiovascular: regular rate and rhythm, no m/r/g Pulmonary/Chest: normal work of breathing on room air Abdominal: soft, non-tender, non-distended MSK: normal bulk and tone.  Left inguinal region exposed.  No erythema no bulge.  Area was palpated with patient's supine as well as standing.  No evidence of hernia or masses palpated.  Palpable inguinal lymph node, nontender.  Musculature in this area palpated, without tenderness. Neurological: alert & oriented x 3, normal gait Skin: warm and dry Psych: Normal mood and thought process  chaperone present during this examination  Assessment & Plan:   See Encounters Tab for problem based charting.  Patient discussed with Dr. Laurena Slimmer, D.O. [facility], PGY-2 Pager: [phone], Phone: [phone] Date 01/25/2021 Time 5:50 PM

## 2021-01-25 NOTE — Patient Instructions (Signed)
Thank you, Mr.Yuvan A Zettel for allowing [name] to provide your care today. Today we discussed   Left-sided groin pain I am glad to see that the groin pain has improved.  Please continue to take ibuprofen or Tylenol for the pain.  Continue to rest the area and limit the amount that you lift at work.  If you notice that the pain returns please call our office and we will schedule you to have an x-ray of the left hip.  If you feel as though the pain is unbearable and you are having a medical emergency please go to the closest emergency department  I have ordered the following labs for you:  Lab Orders  No laboratory test(s) ordered today     Referrals ordered today:   Referral Orders  No referral(s) requested today     I have ordered the following medication/changed the following medications:   Stop the following medications: There are no discontinued medications.   Start the following medications: No orders of the defined types were placed in this encounter.    Follow up:  As needed.      Remember: If the pain returns please call our office  Should you have any questions or concerns please call the internal medicine clinic at [phone].     Sanjuana Letters, D.O. [place]

## 2021-01-25 NOTE — Assessment & Plan Note (Signed)
Assessment: Patient with left groin pain that onset 6 days ago.  Patient states he awoke with the pain.  The day prior he was at work moving lots of brush, the patient works as a Development worker, international aid.  States that the following days, the pain limited his movement and ability to work.  Today my presentation patient states that the pain has significantly improved but that has been bothering in the past few days and he wanted to have it evaluated.  The pain is localized into his left groin.  Denies noticing any bulge.  Denies the pain occurring with bearing down.  He notes that the pain mostly occurs when he is walking.  It improves with rest and ibuprofen.  Denies any trauma prior to the pain onset.  Denies any systemic symptoms, fever or chills.  He denies ever having this before.  Denies difficulty ambulating or history of hip pain.  On my exam, patient was supine.  The area was palpated.  It was nontender.  No hernia was appreciated.  Patient also stood up and was examined.  No hernia when patient performed Valsalva maneuver or coughed.  Inguinal lymph node was palpable, nontender.  Differential includes muscle strain, tendon strain, ligament strain.  Low suspicion for arthritic etiology, however it can present with groin pain.  If pain does persist will have low threshold to order x-ray of the left hip.  Plan: -Continue conservative management, rest ibuprofen/Tylenol, heat or ice. -If no improvement in the next 1 to 2 weeks, can perform left hip x-ray to evaluate for arthritis.

## 2021-01-28 NOTE — Progress Notes (Signed)
Internal Medicine Clinic Attending  Case discussed with Dr. Katsadouros  At the time of the visit.  We reviewed the resident's history and exam and pertinent patient test results.  I agree with the assessment, diagnosis, and plan of care documented in the resident's note.  

## 2021-02-03 ENCOUNTER — Other Ambulatory Visit: Payer: Self-pay

## 2021-02-03 DIAGNOSIS — N529 Male erectile dysfunction, unspecified: Secondary | ICD-10-CM

## 2021-02-03 MED ORDER — SILDENAFIL CITRATE 50 MG PO TABS: ORAL_TABLET | ORAL | 0 refills | Status: DC

## 2021-02-03 NOTE — Telephone Encounter (Signed)
Pt is requesting his sildenafil (VIAGRA) 50 MG tablet sent to  [place] ([place]), [place] - [address] Phone:  [phone]  Fax:  [phone]

## 2021-02-08 NOTE — Progress Notes (Signed)
Surgical Instructions    Your procedure is scheduled on 02/18/21.  Report to [facility] Main Entrance "A" at 08:00 A.M., then check in with the Admitting office.  Call this number if you have problems the morning of surgery:  [phone]   If you have any questions prior to your surgery date call [phone]: Open Monday-Friday 8am-4pm    Remember:  Do not eat after midnight the night before your surgery  You may drink clear liquids until 08:00am the morning of your surgery.   Clear liquids allowed are: Water, Non-Citrus Juices (without pulp), Carbonated Beverages, Clear Tea, Black Coffee Only, and Gatorade  Patient Instructions  The night before surgery:  No food after midnight. ONLY clear liquids after midnight  The day of surgery (if you do NOT have diabetes):  Drink ONE (1) Pre-Surgery Clear Ensure by 08:00am the morning of surgery. Drink in one sitting. Do not sip.  This drink was given to you during your hospital  pre-op appointment visit. Nothing else to drink after completing the  Pre-Surgery Clear Ensure.         If you have questions, please contact your surgeon's office.     Take these medicines the morning of surgery with A SIP OF WATER  acetaminophen (TYLENOL) if needed amLODipine (NORVASC) HYDROcodone-acetaminophen (NORCO/VICODIN) if needed rosuvastatin (CRESTOR)    As of today, STOP taking any Aspirin (unless otherwise instructed by your surgeon) Aleve, Naproxen, Ibuprofen, Motrin, Advil, Goody's, BC's, all herbal medications, fish oil, and all vitamins.          Do not wear jewelry or makeup Do not wear lotions, powders, perfumes/colognes, or deodorant. Men may shave face and neck. Do not bring valuables to the hospital.  DO Not wear nail polish, gel polish, artificial nails, or any other type of covering on natural nails  including finger and toenails. If patients have artificial nails, gel coating, etc. that need to be removed by a nail salon please  have this removed prior to surgery or surgery may need to be canceled/delayed if the surgeon/ anesthesia feels like the patient is unable to be adequately monitored.             [facility] is not responsible for any belongings or valuables.  Do NOT Smoke (Tobacco/Vaping) or drink Alcohol 24 hours prior to your procedure If you use a CPAP at night, you may bring all equipment for your overnight stay.   Contacts, glasses, dentures or bridgework may not be worn into surgery, please bring cases for these belongings   For patients admitted to the hospital, discharge time will be determined by your treatment team.   Patients discharged the day of surgery will not be allowed to drive home, and someone needs to stay with them for 24 hours.  ONLY 1 SUPPORT PERSON MAY BE PRESENT WHILE YOU ARE IN SURGERY. IF YOU ARE TO BE ADMITTED ONCE YOU ARE IN YOUR ROOM YOU WILL BE ALLOWED TWO (2) VISITORS.  Minor children may have two parents present. Special consideration for safety and communication needs will be reviewed on a case by case basis.  Special instructions:    Oral Hygiene is also important to reduce your risk of infection.  Remember - BRUSH YOUR TEETH THE MORNING OF SURGERY WITH YOUR REGULAR TOOTHPASTE   [facility]- Preparing For Surgery  Before surgery, you can play an important role. Because skin is not sterile, your skin needs to be as free of germs as possible. You can reduce  the number of germs on your skin by washing with CHG (chlorahexidine gluconate) Soap before surgery.  CHG is an antiseptic cleaner which kills germs and bonds with the skin to continue killing germs even after washing.     Please do not use if you have an allergy to CHG or antibacterial soaps. If your skin becomes reddened/irritated stop using the CHG.  Do not shave (including legs and underarms) for at least 48 hours prior to first CHG shower. It is OK to shave your face.  Please follow these instructions  carefully.     Shower the NIGHT BEFORE SURGERY and the MORNING OF SURGERY with CHG Soap.   If you chose to wash your hair, wash your hair first as usual with your normal shampoo. After you shampoo, rinse your hair and body thoroughly to remove the shampoo.  Then [employer] and genitals (private parts) with your normal soap and rinse thoroughly to remove soap.  After that Use CHG Soap as you would any other liquid soap. You can apply CHG directly to the skin and wash gently with a scrungie or a clean washcloth.   Apply the CHG Soap to your body ONLY FROM THE NECK DOWN.  Do not use on open wounds or open sores. Avoid contact with your eyes, ears, mouth and genitals (private parts). Wash Face and genitals (private parts)  with your normal soap.   Wash thoroughly, paying special attention to the area where your surgery will be performed.  Thoroughly rinse your body with warm water from the neck down.  DO NOT shower/wash with your normal soap after using and rinsing off the CHG Soap.  Pat yourself dry with a CLEAN TOWEL.  Wear CLEAN PAJAMAS to bed the night before surgery  Place CLEAN SHEETS on your bed the night before your surgery  DO NOT SLEEP WITH PETS.   Day of Surgery: Take a shower with CHG soap. Wear Clean/Comfortable clothing the morning of surgery Do not apply any deodorants/lotions.   Remember to brush your teeth WITH YOUR REGULAR TOOTHPASTE.   Please read over the following fact sheets that you were given.

## 2021-02-09 ENCOUNTER — Encounter (HOSPITAL_COMMUNITY): Payer: Self-pay

## 2021-02-09 ENCOUNTER — Other Ambulatory Visit: Payer: Self-pay

## 2021-02-09 ENCOUNTER — Encounter (HOSPITAL_COMMUNITY)
Admission: RE | Admit: 2021-02-09 | Discharge: 2021-02-09 | Disposition: A | Discharge status: Home / Self Care | Payer: 59 | Source: Ambulatory Visit | Attending: Orthopedic Surgery | Admitting: Orthopedic Surgery

## 2021-02-09 DIAGNOSIS — Z79899 Other long term (current) drug therapy: Secondary | ICD-10-CM | POA: Diagnosis not present

## 2021-02-09 DIAGNOSIS — Z8673 Personal history of transient ischemic attack (TIA), and cerebral infarction without residual deficits: Secondary | ICD-10-CM | POA: Insufficient documentation

## 2021-02-09 DIAGNOSIS — Z7901 Long term (current) use of anticoagulants: Secondary | ICD-10-CM | POA: Diagnosis not present

## 2021-02-09 DIAGNOSIS — I10 Essential (primary) hypertension: Secondary | ICD-10-CM | POA: Diagnosis not present

## 2021-02-09 DIAGNOSIS — Z7982 Long term (current) use of aspirin: Secondary | ICD-10-CM | POA: Diagnosis not present

## 2021-02-09 DIAGNOSIS — Z87891 Personal history of nicotine dependence: Secondary | ICD-10-CM | POA: Diagnosis not present

## 2021-02-09 DIAGNOSIS — Z01818 Encounter for other preprocedural examination: Secondary | ICD-10-CM | POA: Insufficient documentation

## 2021-02-09 DIAGNOSIS — M5412 Radiculopathy, cervical region: Secondary | ICD-10-CM | POA: Diagnosis not present

## 2021-02-09 DIAGNOSIS — E785 Hyperlipidemia, unspecified: Secondary | ICD-10-CM | POA: Diagnosis not present

## 2021-02-09 HISTORY — DX: Other specified postprocedural states: Z98.890

## 2021-02-09 HISTORY — DX: Other specified postprocedural states: R11.2

## 2021-02-09 HISTORY — DX: Headache, unspecified: R51.9

## 2021-02-09 LAB — TYPE AND SCREEN
ABO/RH(D): O POS
Antibody Screen: NEGATIVE

## 2021-02-09 LAB — CBC WITH DIFFERENTIAL/PLATELET
Abs Immature Granulocytes: 0 10*3/uL (ref 0.00–0.07)
Basophils Absolute: 0 10*3/uL (ref 0.0–0.1)
Basophils Relative: 1 %
Eosinophils Absolute: 0.1 10*3/uL (ref 0.0–0.5)
Eosinophils Relative: 3 %
HCT: 44.6 % (ref 39.0–52.0)
Hemoglobin: 14.9 g/dL (ref 13.0–17.0)
Immature Granulocytes: 0 %
Lymphocytes Relative: 42 %
Lymphs Abs: 1.8 10*3/uL (ref 0.7–4.0)
MCH: 29.3 pg (ref 26.0–34.0)
MCHC: 33.4 g/dL (ref 30.0–36.0)
MCV: 87.6 fL (ref 80.0–100.0)
Monocytes Absolute: 0.3 10*3/uL (ref 0.1–1.0)
Monocytes Relative: 7 %
Neutro Abs: 2 10*3/uL (ref 1.7–7.7)
Neutrophils Relative %: 47 %
Platelets: 290 10*3/uL (ref 150–400)
RBC: 5.09 MIL/uL (ref 4.22–5.81)
RDW: 11.7 % (ref 11.5–15.5)
WBC: 4.3 10*3/uL (ref 4.0–10.5)
nRBC: 0 % (ref 0.0–0.2)

## 2021-02-09 LAB — URINALYSIS, ROUTINE W REFLEX MICROSCOPIC
Bilirubin Urine: NEGATIVE
Glucose, UA: NEGATIVE mg/dL
Hgb urine dipstick: NEGATIVE
Ketones, ur: 5 mg/dL — AB
Leukocytes,Ua: NEGATIVE
Nitrite: NEGATIVE
Protein, ur: NEGATIVE mg/dL
Specific Gravity, Urine: 1.029 (ref 1.005–1.030)
pH: 5 (ref 5.0–8.0)

## 2021-02-09 LAB — COMPREHENSIVE METABOLIC PANEL
ALT: 22 U/L (ref 0–44)
AST: 23 U/L (ref 15–41)
Albumin: 4.2 g/dL (ref 3.5–5.0)
Alkaline Phosphatase: 44 U/L (ref 38–126)
Anion gap: 9 (ref 5–15)
BUN: 13 mg/dL (ref 8–23)
CO2: 30 mmol/L (ref 22–32)
Calcium: 9.1 mg/dL (ref 8.9–10.3)
Chloride: 100 mmol/L (ref 98–111)
Creatinine, Ser: 0.86 mg/dL (ref 0.61–1.24)
GFR, Estimated: >60 mL/min (ref >60)
Glucose, Bld: 130 mg/dL — ABNORMAL HIGH (ref 70–99)
Potassium: 2.7 mmol/L — CL (ref 3.5–5.1)
Sodium: 139 mmol/L (ref 135–145)
Total Bilirubin: 0.4 mg/dL (ref 0.3–1.2)
Total Protein: 6.8 g/dL (ref 6.5–8.1)

## 2021-02-09 LAB — SURGICAL PCR SCREEN
MRSA, PCR: NEGATIVE
Staphylococcus aureus: NEGATIVE

## 2021-02-09 LAB — APTT: aPTT: 31 seconds (ref 24–36)

## 2021-02-09 LAB — PROTIME-INR
INR: 1 (ref 0.8–1.2)
Prothrombin Time: 13.5 seconds (ref 11.4–15.2)

## 2021-02-09 NOTE — Progress Notes (Addendum)
Notified Dr. Lynann Bologna of ketones in urine as well as K 2.7 via staff message and voicemail left for scheduler.

## 2021-02-09 NOTE — Progress Notes (Signed)
PCP - Tour manager - denies  PPM/ICD - denies   Chest x-ray - n/a EKG - 02/09/21 Stress Test - 02/12/06 ECHO - 08/01/12 Cardiac Cath - denies  Sleep Study - denies   No diabetes  As of today, STOP taking any Aspirin (unless otherwise instructed by your surgeon) Aleve, Naproxen, Ibuprofen, Motrin, Advil, Goody's, BC's, all herbal medications, fish oil, and all vitamins.  ERAS Protcol -yes PRE-SURGERY Ensure or G2- ensure given  COVID TEST- insrtucted patient to go to [facility] and get tested 8/1, 8/2 or  8/3   Anesthesia review: yes, cardiac history  Patient denies shortness of breath, fever, cough and chest pain at PAT appointment   All instructions explained to the patient, with a verbal understanding of the material. Patient agrees to go over the instructions while at home for a better understanding. Patient also instructed to self quarantine after being tested for COVID-19. The opportunity to ask questions was provided.

## 2021-02-10 NOTE — Progress Notes (Signed)
Anesthesia Chart Review:  Case: [id number] Date/Time: 02/18/21 1045   Procedure: ANTERIOR CERVICAL DECOMPRESSION FUSION CERVIAL 4- CERVICAL 5, CERVICAL 5- CERVICAL 6, CERVICAL 6- CERVICAL 7 WITH INSTRUMENTATION AND ALLOGRAFT   Anesthesia type: General   Pre-op diagnosis: BILATERAL CERVICAL RADICULOPATHY   Location: [facility] OR ROOM 05 / [facility]   Surgeons: Phylliss Bob, MD       DISCUSSION: [name] is a 65 year old male scheduled for Christopher above procedure.  History includes former smoker (quit 07/2012), post-operative N/V, HTN, HLD, CVA (08/01/12), former cocaine and alcohol abuse (quit ~2012).   Preoperative labs revealed hypokalemia with potassium 2.7.  He is on HCTZ 25 mg which could be contributing but does not appear to be a new medication.  BUN 13, creatinine 0.86.  LFTs and CBC normal.  Butch Penny at Dr. Laurena Bering office is aware of K 2.7 and advised that [name] would need supplemented prior to surgery with goal of 3-5 range for surgery. Will order repeat ISTAT or BMET for Christopher day of surgery.   EKG with NSR, septal infarct (old, cited on 08/01/12), non-specific T wave abnormality. He denied shortness of breath, cough, fever, chest pain at PAT RN visit. Normal LVEF with mild LVH on echo at Christopher time of 07/2012 CVA.   He was instructed to have preoperative COVID testing between 02/15/21-02/17/21.   Anesthesia team to evaluate on Christopher day of surgery.   VS: BP (!) 131/93   Pulse 67   Temp 36.7 C   Resp 18   Ht '5\' 8"'$  (1.727 m)   Wt 75 kg   SpO2 97%   BMI 25.15 kg/m   PROVIDERS: Riesa Pope, MD is PCP [facility]). Last evaluation 01/25/21.    LABS: Labs reviewed: Repeat K on Christopher day of surgery.  (all labs ordered are listed, but only abnormal results are displayed)  Labs Reviewed  COMPREHENSIVE METABOLIC PANEL - Abnormal; Notable for Christopher following components:      Result Value   Potassium 2.7 (*)    Glucose, Bld 130 (*)    All other components within normal limits   URINALYSIS, ROUTINE W REFLEX MICROSCOPIC - Abnormal; Notable for Christopher following components:   Color, Urine AMBER (*)    APPearance HAZY (*)    Ketones, ur 5 (*)    All other components within normal limits  SURGICAL PCR SCREEN  CBC WITH DIFFERENTIAL/PLATELET  PROTIME-INR  APTT  TYPE AND SCREEN     IMAGES: MRI C-spine 11/05/20 (report in [facility]/PACS): IMPRESSION: 1. Degenerative spondylosis from C3-4 through C7-T1. No compressive central canal stenosis. No evidence definitely acute or post traumatic pathology. 2. Foraminal narrowing that could possibly affect Christopher exiting nerve roots at C4-5, C5-6, C6-7 and C7-T1, particularly Christopher right at C6-7 and C7-T1. Question of a small disc fragment in Christopher foramen on Christopher right at C6-7.    EKG: 02/09/21:  Normal sinus rhythm Septal infarct , age undetermined Nonspecific T wave abnormality Abnormal ECG Since last tracing Nonspecific T wave abnormality is now Present Confirmed by Nelva Bush [phone]) on 02/09/2021 9:47:36 PM   CV: Carotid US 08/02/12: Summary:  Bilateral: mild intimal wall thickening CCA. Mild mixed  plaque origin ICA. No significant ICA stenosis. Bilateral  tortuous vessels.    Echo 08/01/12: Study Conclusions  Left ventricle: Christopher cavity size was normal. Wall thickness  was increased in a pattern of mild LVH. Systolic function  was normal. Christopher estimated ejection fraction was in Christopher range  of 60% to  65%.            Nonischemic stress test in 2007.  Past Medical History:  Diagnosis Date   Arthritis    due to age   Cocaine abuse ([place])    quit   ETOH abuse    quit   Headache    Hx of adenomatous polyp of colon    Hyperlipidemia    Hypertension    PONV (postoperative nausea and vomiting)    Stroke [facility])    posterior limb, R internal capsule stroke 07/2012    Past Surgical History:  Procedure Laterality Date   bone spur repair Left    COLONOSCOPY     HAND SURGERY     left hand    MEDICATIONS:   acetaminophen (TYLENOL) 500 MG tablet   amLODipine (NORVASC) 10 MG tablet   aspirin (EQ ASPIRIN ADULT LOW DOSE) 81 MG EC tablet   docusate sodium (COLACE) 50 MG capsule   Ginseng 250 MG CAPS   hydrochlorothiazide (HYDRODIURIL) 25 MG tablet   HYDROcodone-acetaminophen (NORCO/VICODIN) 5-325 MG tablet   ibuprofen (ADVIL) 200 MG tablet   Menthol, Topical Analgesic, (BIOFREEZE EX)   Multiple Vitamins-Minerals (ADULT GUMMY PO)   Omega-3 Fatty Acids (FISH OIL) 1000 MG CAPS   omeprazole (PRILOSEC) 20 MG capsule   OVER Christopher COUNTER MEDICATION   rosuvastatin (CRESTOR) 20 MG tablet   sildenafil (VIAGRA) 50 MG tablet    0.9 %  sodium chloride infusion    Myra Gianotti, PA-C Surgical Short Stay/Anesthesiology [facility] Phone [phone] [facility] Phone [phone] 02/10/2021 6:06 PM

## 2021-02-15 ENCOUNTER — Telehealth: Payer: Self-pay | Admitting: Student

## 2021-02-15 DIAGNOSIS — E876 Hypokalemia: Secondary | ICD-10-CM

## 2021-02-15 MED ORDER — POTASSIUM CHLORIDE CRYS ER 20 MEQ PO TBCR: 40.0000 meq | EXTENDED_RELEASE_TABLET | Freq: Three times a day (TID) | ORAL | 0 refills | Status: DC

## 2021-02-15 NOTE — Telephone Encounter (Signed)
Pt reporting he has surgery sch on 02/18/2021. Pt states after his pre admission work up on 02/09/2021, he rec'd a phone call stating he needs to contact his PCP about his Potassium Level, Blood Sugar and Urine Lab work up before his sch surgery.

## 2021-02-15 NOTE — Addendum Note (Signed)
Addended by: Riesa Pope on: 02/15/2021 03:15 PM   Modules accepted: Orders

## 2021-02-15 NOTE — Telephone Encounter (Addendum)
Patient called and did not answer. Voicemail left, informed patient of low potassium levels (found on labs performed for patients pre-op appointment). It does not appear as though this was rechecked  Will try calling him again later this afternoon. He will need urgent PO supplementation and if symptomatic, will need to be seen in ED. He has had hypokalemia in the past, however, does not appear to have been this low. He is on HCTZ 25 mg daily. If persistent, may also benefit from hyperaldosteronism workup.   Will reach out to front desk staff to have lab only appointment scheduled at the end of this week to repeat his potassium levels. PO Klorcon 40 meq TID for 5 days ordered.   Addendum:  Patient presented to clinic today for repeat lab work and potassium was found to be over 4.3. Will continue patient on potassium supplementation and plan to repeat lab work later this month. If still having episodes of hypokalemia will need to work up further.

## 2021-02-16 ENCOUNTER — Other Ambulatory Visit (INDEPENDENT_AMBULATORY_CARE_PROVIDER_SITE_OTHER): Payer: 59

## 2021-02-16 DIAGNOSIS — E876 Hypokalemia: Secondary | ICD-10-CM | POA: Diagnosis not present

## 2021-02-16 LAB — BASIC METABOLIC PANEL
Anion gap: 7 (ref 5–15)
BUN: 9 mg/dL (ref 8–23)
CO2: 27 mmol/L (ref 22–32)
Calcium: 9.1 mg/dL (ref 8.9–10.3)
Chloride: 102 mmol/L (ref 98–111)
Creatinine, Ser: 0.71 mg/dL (ref 0.61–1.24)
GFR, Estimated: >60 mL/min (ref >60)
Glucose, Bld: 92 mg/dL (ref 70–99)
Potassium: 4.3 mmol/L (ref 3.5–5.1)
Sodium: 136 mmol/L (ref 135–145)

## 2021-02-17 ENCOUNTER — Other Ambulatory Visit: Payer: Self-pay | Admitting: Orthopedic Surgery

## 2021-02-17 DIAGNOSIS — E876 Hypokalemia: Secondary | ICD-10-CM | POA: Insufficient documentation

## 2021-02-17 LAB — SARS CORONAVIRUS 2 (TAT 6-24 HRS): SARS Coronavirus 2: NEGATIVE

## 2021-02-17 NOTE — Assessment & Plan Note (Signed)
Assessment: Patient with preoperative labs performed last week and found to have potassium of 2.7. Patient contacted and started on Klor-Con 40 meq TID for 5 days. Repeat K of above 4.0. Will call patient with lab results and inform him to decrease to 51mq daily until he follows up with me in clinic later this month.   On past notes it appears patient has had hypokalemia in the past. He is on a thiazide diuretic. Will continue to monitor and repeat at follow up visit.   Plan: -Continue potassium supplementation -Follow up in clinic later this month for repeat labs

## 2021-02-18 ENCOUNTER — Ambulatory Visit (HOSPITAL_COMMUNITY): Payer: 59

## 2021-02-18 ENCOUNTER — Ambulatory Visit (HOSPITAL_COMMUNITY)
Admission: RE | Disposition: A | Discharge status: Home / Self Care | Payer: Self-pay | Source: Home / Self Care | Attending: Orthopedic Surgery

## 2021-02-18 ENCOUNTER — Ambulatory Visit (HOSPITAL_COMMUNITY)
Admission: RE | Admit: 2021-02-18 | Discharge: 2021-02-18 | Disposition: A | Discharge status: Home / Self Care | Payer: 59 | Attending: Orthopedic Surgery | Admitting: Orthopedic Surgery

## 2021-02-18 ENCOUNTER — Ambulatory Visit (HOSPITAL_COMMUNITY): Payer: 59 | Admitting: Vascular Surgery

## 2021-02-18 ENCOUNTER — Encounter (HOSPITAL_COMMUNITY): Payer: Self-pay | Admitting: Orthopedic Surgery

## 2021-02-18 DIAGNOSIS — Z791 Long term (current) use of non-steroidal anti-inflammatories (NSAID): Secondary | ICD-10-CM | POA: Diagnosis not present

## 2021-02-18 DIAGNOSIS — Z87891 Personal history of nicotine dependence: Secondary | ICD-10-CM | POA: Diagnosis not present

## 2021-02-18 DIAGNOSIS — Z79899 Other long term (current) drug therapy: Secondary | ICD-10-CM | POA: Diagnosis not present

## 2021-02-18 DIAGNOSIS — Z800 Family history of malignant neoplasm of digestive organs: Secondary | ICD-10-CM | POA: Diagnosis not present

## 2021-02-18 DIAGNOSIS — Z880 Allergy status to penicillin: Secondary | ICD-10-CM | POA: Insufficient documentation

## 2021-02-18 DIAGNOSIS — M5412 Radiculopathy, cervical region: Secondary | ICD-10-CM | POA: Insufficient documentation

## 2021-02-18 DIAGNOSIS — Z7982 Long term (current) use of aspirin: Secondary | ICD-10-CM | POA: Insufficient documentation

## 2021-02-18 DIAGNOSIS — M4802 Spinal stenosis, cervical region: Secondary | ICD-10-CM | POA: Insufficient documentation

## 2021-02-18 DIAGNOSIS — Z888 Allergy status to other drugs, medicaments and biological substances status: Secondary | ICD-10-CM | POA: Diagnosis not present

## 2021-02-18 DIAGNOSIS — Z419 Encounter for procedure for purposes other than remedying health state, unspecified: Secondary | ICD-10-CM

## 2021-02-18 HISTORY — PX: ANTERIOR CERVICAL DECOMPRESSION/DISCECTOMY FUSION 4 LEVELS: SHX5556

## 2021-02-18 LAB — POCT I-STAT, CHEM 8
BUN: 11 mg/dL (ref 8–23)
Calcium, Ion: 1.12 mmol/L — ABNORMAL LOW (ref 1.15–1.40)
Chloride: 102 mmol/L (ref 98–111)
Creatinine, Ser: 0.7 mg/dL (ref 0.61–1.24)
Glucose, Bld: 144 mg/dL — ABNORMAL HIGH (ref 70–99)
HCT: 45 % (ref 39.0–52.0)
Hemoglobin: 15.3 g/dL (ref 13.0–17.0)
Potassium: 3.9 mmol/L (ref 3.5–5.1)
Sodium: 138 mmol/L (ref 135–145)
TCO2: 27 mmol/L (ref 22–32)

## 2021-02-18 SURGERY — ANTERIOR CERVICAL DECOMPRESSION/DISCECTOMY FUSION 4 LEVELS
Anesthesia: General | Site: Spine Cervical

## 2021-02-18 MED ORDER — METHOCARBAMOL 500 MG PO TABS: 500.0000 mg | ORAL_TABLET | Freq: Four times a day (QID) | ORAL | 0 refills | Status: DC | PRN

## 2021-02-18 MED ORDER — MIDAZOLAM HCL 5 MG/5ML IJ SOLN
INTRAMUSCULAR | Status: DC | PRN
  Administered 2021-02-18: 2 mg via INTRAVENOUS

## 2021-02-18 MED ORDER — DEXAMETHASONE SODIUM PHOSPHATE 10 MG/ML IJ SOLN
INTRAMUSCULAR | Status: DC | PRN
  Administered 2021-02-18: 10 mg via INTRAVENOUS

## 2021-02-18 MED ORDER — FENTANYL CITRATE (PF) 100 MCG/2ML IJ SOLN
INTRAMUSCULAR | Status: AC
  Administered 2021-02-18: 100 ug via INTRAVENOUS
  Filled 2021-02-18: qty 2

## 2021-02-18 MED ORDER — THROMBIN 20000 UNITS EX KIT
PACK | CUTANEOUS | Status: AC
  Filled 2021-02-18: qty 1

## 2021-02-18 MED ORDER — BUPIVACAINE HCL (PF) 0.25 % IJ SOLN
INTRAMUSCULAR | Status: AC
  Filled 2021-02-18: qty 10

## 2021-02-18 MED ORDER — LIDOCAINE 2% (20 MG/ML) 5 ML SYRINGE
INTRAMUSCULAR | Status: DC | PRN
  Administered 2021-02-18: 100 mg via INTRAVENOUS

## 2021-02-18 MED ORDER — HEMOSTATIC AGENTS (NO CHARGE) OPTIME
TOPICAL | Status: DC | PRN
  Administered 2021-02-18: 1 via TOPICAL

## 2021-02-18 MED ORDER — VANCOMYCIN HCL IN DEXTROSE 1-5 GM/200ML-% IV SOLN
1000.0000 mg | INTRAVENOUS | Status: AC
  Administered 2021-02-18: 1000 mg via INTRAVENOUS
  Filled 2021-02-18: qty 200

## 2021-02-18 MED ORDER — THROMBIN 20000 UNITS EX SOLR
CUTANEOUS | Status: DC | PRN
  Administered 2021-02-18: 20000 [IU] via TOPICAL

## 2021-02-18 MED ORDER — BUPIVACAINE-EPINEPHRINE 0.25% -1:200000 IJ SOLN
INTRAMUSCULAR | Status: DC | PRN
  Administered 2021-02-18: 4 mL

## 2021-02-18 MED ORDER — PHENYLEPHRINE HCL-NACL 20-0.9 MG/250ML-% IV SOLN
INTRAVENOUS | Status: DC | PRN
  Administered 2021-02-18: 30 ug/min via INTRAVENOUS

## 2021-02-18 MED ORDER — PROPOFOL 10 MG/ML IV BOLUS
INTRAVENOUS | Status: AC
  Filled 2021-02-18: qty 20

## 2021-02-18 MED ORDER — THROMBIN 5000 UNITS EX SOLR
CUTANEOUS | Status: AC
  Filled 2021-02-18: qty 5000

## 2021-02-18 MED ORDER — ONDANSETRON HCL 4 MG/2ML IJ SOLN: 4.0000 mg | Freq: Once | INTRAMUSCULAR | Status: DC | PRN

## 2021-02-18 MED ORDER — MIDAZOLAM HCL 2 MG/2ML IJ SOLN
INTRAMUSCULAR | Status: AC
  Filled 2021-02-18: qty 2

## 2021-02-18 MED ORDER — LACTATED RINGERS IV SOLN: INTRAVENOUS | Status: DC

## 2021-02-18 MED ORDER — FENTANYL CITRATE (PF) 100 MCG/2ML IJ SOLN
25.0000 ug | INTRAMUSCULAR | Status: DC | PRN
  Administered 2021-02-18: 25 ug via INTRAVENOUS
  Administered 2021-02-18: 50 ug via INTRAVENOUS
  Administered 2021-02-18: 25 ug via INTRAVENOUS

## 2021-02-18 MED ORDER — SUGAMMADEX SODIUM 200 MG/2ML IV SOLN
INTRAVENOUS | Status: DC | PRN
  Administered 2021-02-18: 200 mg via INTRAVENOUS

## 2021-02-18 MED ORDER — BUPIVACAINE-EPINEPHRINE (PF) 0.25% -1:200000 IJ SOLN
INTRAMUSCULAR | Status: AC
  Filled 2021-02-18: qty 30

## 2021-02-18 MED ORDER — DEXMEDETOMIDINE HCL IN NACL 200 MCG/50ML IV SOLN
INTRAVENOUS | Status: DC | PRN
  Administered 2021-02-18: 12 ug via INTRAVENOUS

## 2021-02-18 MED ORDER — ONDANSETRON HCL 4 MG/2ML IJ SOLN
INTRAMUSCULAR | Status: DC | PRN
Start: 2021-02-18 — End: 2021-02-18
  Administered 2021-02-18: 4 mg via INTRAVENOUS

## 2021-02-18 MED ORDER — HYDROCODONE-ACETAMINOPHEN 5-325 MG PO TABS: 1.0000 | ORAL_TABLET | Freq: Four times a day (QID) | ORAL | 0 refills | Status: DC | PRN

## 2021-02-18 MED ORDER — CHLORHEXIDINE GLUCONATE 0.12 % MT SOLN
15.0000 mL | Freq: Once | OROMUCOSAL | Status: AC
  Administered 2021-02-18: 15 mL via OROMUCOSAL
  Filled 2021-02-18: qty 15

## 2021-02-18 MED ORDER — OXYCODONE HCL 5 MG/5ML PO SOLN: 5.0000 mg | Freq: Once | ORAL | Status: DC | PRN

## 2021-02-18 MED ORDER — FENTANYL CITRATE (PF) 100 MCG/2ML IJ SOLN: 100.0000 ug | Freq: Once | INTRAMUSCULAR | Status: AC

## 2021-02-18 MED ORDER — ACETAMINOPHEN 160 MG/5ML PO SOLN: 325.0000 mg | ORAL | Status: DC | PRN

## 2021-02-18 MED ORDER — PROPOFOL 10 MG/ML IV BOLUS
INTRAVENOUS | Status: DC | PRN
  Administered 2021-02-18: 120 mg via INTRAVENOUS

## 2021-02-18 MED ORDER — ORAL CARE MOUTH RINSE: 15.0000 mL | Freq: Once | OROMUCOSAL | Status: AC

## 2021-02-18 MED ORDER — ACETAMINOPHEN 325 MG PO TABS: 325.0000 mg | ORAL_TABLET | ORAL | Status: DC | PRN

## 2021-02-18 MED ORDER — FENTANYL CITRATE (PF) 100 MCG/2ML IJ SOLN
INTRAMUSCULAR | Status: DC | PRN
  Administered 2021-02-18 (×3): 50 ug via INTRAVENOUS

## 2021-02-18 MED ORDER — 0.9 % SODIUM CHLORIDE (POUR BTL) OPTIME
TOPICAL | Status: DC | PRN
  Administered 2021-02-18: 1000 mL

## 2021-02-18 MED ORDER — OXYCODONE HCL 5 MG PO TABS: 5.0000 mg | ORAL_TABLET | Freq: Once | ORAL | Status: DC | PRN

## 2021-02-18 MED ORDER — FENTANYL CITRATE (PF) 250 MCG/5ML IJ SOLN
INTRAMUSCULAR | Status: AC
  Filled 2021-02-18: qty 5

## 2021-02-18 MED ORDER — ROCURONIUM BROMIDE 10 MG/ML (PF) SYRINGE
PREFILLED_SYRINGE | INTRAVENOUS | Status: DC | PRN
  Administered 2021-02-18: 20 mg via INTRAVENOUS
  Administered 2021-02-18 (×2): 50 mg via INTRAVENOUS
  Administered 2021-02-18: 20 mg via INTRAVENOUS

## 2021-02-18 MED ORDER — MEPERIDINE HCL 25 MG/ML IJ SOLN: 6.2500 mg | INTRAMUSCULAR | Status: DC | PRN

## 2021-02-18 MED ORDER — FENTANYL CITRATE (PF) 100 MCG/2ML IJ SOLN
INTRAMUSCULAR | Status: AC
  Filled 2021-02-18: qty 2

## 2021-02-18 MED ORDER — POVIDONE-IODINE 7.5 % EX SOLN
Freq: Once | CUTANEOUS | Status: DC
  Filled 2021-02-18: qty 118

## 2021-02-18 SURGICAL SUPPLY — 81 items
AGENT HMST KT MTR STRL THRMB (HEMOSTASIS)
APL SKNCLS STERI-STRIP NONHPOA (GAUZE/BANDAGES/DRESSINGS) ×1
BAG COUNTER SPONGE SURGICOUNT (BAG) ×2 IMPLANT
BAG SPNG CNTER NS LX DISP (BAG) ×1
BENZOIN TINCTURE PRP APPL 2/3 (GAUZE/BANDAGES/DRESSINGS) ×2 IMPLANT
BIT DRILL NEURO 2X3.1 SFT TUCH (MISCELLANEOUS) ×1 IMPLANT
BIT DRILL SRG 14X2.2XFLT CHK (BIT) IMPLANT
BIT DRL SRG 14X2.2XFLT CHK (BIT) ×1
BLADE CLIPPER SURG (BLADE) ×2 IMPLANT
BLADE SURG 15 STRL LF DISP TIS (BLADE) ×1 IMPLANT
BLADE SURG 15 STRL SS (BLADE) ×2
BONE VIVIGEN FORMABLE 1.3CC (Bone Implant) ×4 IMPLANT
BONE VIVIGEN FORMABLE 5.4CC (Bone Implant) ×2 IMPLANT
BUR MATCHSTICK NEURO 3.0 LAGG (BURR) IMPLANT
CARTRIDGE OIL MAESTRO DRILL (MISCELLANEOUS) ×1 IMPLANT
COVER SURGICAL LIGHT HANDLE (MISCELLANEOUS) ×2 IMPLANT
DECANTER SPIKE VIAL GLASS SM (MISCELLANEOUS) ×2 IMPLANT
DEVICE ENDSKLTN IMPLNT MED 6MM (Orthopedic Implant) IMPLANT
DIFFUSER DRILL AIR PNEUMATIC (MISCELLANEOUS) ×2 IMPLANT
DRAIN JACKSON RD 7FR 3/32 (WOUND CARE) IMPLANT
DRAPE C-ARM 42X72 X-RAY (DRAPES) ×2 IMPLANT
DRAPE POUCH INSTRU U-SHP 10X18 (DRAPES) ×2 IMPLANT
DRAPE SURG 17X23 STRL (DRAPES) ×6 IMPLANT
DRILL BIT SKYLINE 14MM (BIT) ×2
DRILL NEURO 2X3.1 SOFT TOUCH (MISCELLANEOUS) ×2
DURAPREP 26ML APPLICATOR (WOUND CARE) ×2 IMPLANT
ELECT COATED BLADE 2.86 ST (ELECTRODE) ×2 IMPLANT
ELECT REM PT RETURN 9FT ADLT (ELECTROSURGICAL) ×2
ELECTRODE REM PT RTRN 9FT ADLT (ELECTROSURGICAL) ×1 IMPLANT
ENDOSKELETON IMPLANT MED 6MM (Orthopedic Implant) ×2 IMPLANT
EVACUATOR SILICONE 100CC (DRAIN) IMPLANT
GAUZE 4X4 16PLY ~~LOC~~+RFID DBL (SPONGE) ×2 IMPLANT
GAUZE SPONGE 4X4 12PLY STRL (GAUZE/BANDAGES/DRESSINGS) ×2 IMPLANT
GLOVE SRG 8 PF TXTR STRL LF DI (GLOVE) ×1 IMPLANT
GLOVE SURG ENC MOIS LTX SZ7 (GLOVE) ×2 IMPLANT
GLOVE SURG ENC MOIS LTX SZ8 (GLOVE) ×2 IMPLANT
GLOVE SURG UNDER POLY LF SZ7 (GLOVE) ×4 IMPLANT
GLOVE SURG UNDER POLY LF SZ8 (GLOVE) ×2
GOWN STRL REUS W/ TWL LRG LVL3 (GOWN DISPOSABLE) ×1 IMPLANT
GOWN STRL REUS W/ TWL XL LVL3 (GOWN DISPOSABLE) ×1 IMPLANT
GOWN STRL REUS W/TWL LRG LVL3 (GOWN DISPOSABLE) ×2
GOWN STRL REUS W/TWL XL LVL3 (GOWN DISPOSABLE) ×2
GRAFT BNE MATRIX VG FRMBL MD 5 (Bone Implant) IMPLANT
GRAFT BNE MATRIX VG FRMBL SM 1 (Bone Implant) IMPLANT
INTERLOCK LRDTC CRVCL VBR 6MM (Bone Implant) IMPLANT
INTERLOCK LRDTC CRVCL VBR 7MM (Bone Implant) IMPLANT
IV CATH 14GX2 1/4 (CATHETERS) ×2 IMPLANT
KIT BASIN OR (CUSTOM PROCEDURE TRAY) ×2 IMPLANT
KIT TURNOVER KIT B (KITS) ×2 IMPLANT
LORDOTIC CERVICAL VBR 6MM SM (Bone Implant) ×2 IMPLANT
LORDOTIC CERVICAL VBR 7MM SM (Bone Implant) ×2 IMPLANT
MANIFOLD NEPTUNE II (INSTRUMENTS) ×2 IMPLANT
NDL PRECISIONGLIDE 27X1.5 (NEEDLE) ×1 IMPLANT
NDL SPNL 20GX3.5 QUINCKE YW (NEEDLE) ×1 IMPLANT
NEEDLE PRECISIONGLIDE 27X1.5 (NEEDLE) ×2 IMPLANT
NEEDLE SPNL 20GX3.5 QUINCKE YW (NEEDLE) ×2 IMPLANT
NS IRRIG 1000ML POUR BTL (IV SOLUTION) ×2 IMPLANT
OIL CARTRIDGE MAESTRO DRILL (MISCELLANEOUS) ×2
PACK ORTHO CERVICAL (CUSTOM PROCEDURE TRAY) ×2 IMPLANT
PAD ARMBOARD 7.5X6 YLW CONV (MISCELLANEOUS) ×4 IMPLANT
PATTIES SURGICAL .5 X.5 (GAUZE/BANDAGES/DRESSINGS) IMPLANT
PATTIES SURGICAL .5 X1 (DISPOSABLE) IMPLANT
PIN DISTRACTION 14 (PIN) ×2 IMPLANT
PLATE SKYLINE 3LVL 45MM CERV (Plate) ×1 IMPLANT
POSITIONER HEAD DONUT 9IN (MISCELLANEOUS) ×2 IMPLANT
SCREW SKYLINE VAR OS 14MM (Screw) ×8 IMPLANT
SPONGE INTESTINAL PEANUT (DISPOSABLE) ×4 IMPLANT
SPONGE SURGIFOAM ABS GEL 100 (HEMOSTASIS) ×2 IMPLANT
STRIP CLOSURE SKIN 1/2X4 (GAUZE/BANDAGES/DRESSINGS) ×2 IMPLANT
SURGIFLO W/THROMBIN 8M KIT (HEMOSTASIS) IMPLANT
SUT MNCRL AB 4-0 PS2 18 (SUTURE) ×2 IMPLANT
SUT VIC AB 2-0 CT2 18 VCP726D (SUTURE) ×2 IMPLANT
SYR BULB IRRIG 60ML STRL (SYRINGE) ×2 IMPLANT
SYR CONTROL 10ML LL (SYRINGE) ×4 IMPLANT
TAPE CLOTH 4X10 WHT NS (GAUZE/BANDAGES/DRESSINGS) ×2 IMPLANT
TAPE UMBILICAL COTTON 1/8X30 (MISCELLANEOUS) ×2 IMPLANT
TOWEL GREEN STERILE (TOWEL DISPOSABLE) ×2 IMPLANT
TOWEL GREEN STERILE FF (TOWEL DISPOSABLE) ×2 IMPLANT
TRAY FOLEY MTR SLVR 16FR STAT (SET/KITS/TRAYS/PACK) ×2 IMPLANT
WATER STERILE IRR 1000ML POUR (IV SOLUTION) ×2 IMPLANT
YANKAUER SUCT BULB TIP NO VENT (SUCTIONS) ×2 IMPLANT

## 2021-02-18 NOTE — Anesthesia Procedure Notes (Signed)
Procedure Name: Intubation Date/Time: 02/18/2021 11:20 AM Performed by: Annamary Carolin, CRNA Pre-anesthesia Checklist: Patient identified, Emergency Drugs available, Suction available and Patient being monitored Patient Re-evaluated:Patient Re-evaluated prior to induction Oxygen Delivery Method: Circle System Utilized Preoxygenation: Pre-oxygenation with 100% oxygen Induction Type: IV induction Ventilation: Mask ventilation without difficulty Laryngoscope Size: Glidescope and 3 Grade View: Grade I Tube type: Oral Number of attempts: 1 Airway Equipment and Method: Stylet and Oral airway Placement Confirmation: ETT inserted through vocal cords under direct vision, positive ETCO2 and breath sounds checked- equal and bilateral Tube secured with: Tape Dental Injury: Teeth and Oropharynx as per pre-operative assessment  Comments: By Missy Sabins

## 2021-02-18 NOTE — Transfer of Care (Signed)
Immediate Anesthesia Transfer of Care Note  Patient: [name]  Procedure(s) Performed: ANTERIOR CERVICAL DECOMPRESSION FUSION CERVIAL 4- CERVICAL 5, CERVICAL 5- CERVICAL 6, CERVICAL 6- CERVICAL 7 WITH INSTRUMENTATION AND ALLOGRAFT (Spine Cervical)  Patient Location: PACU  Anesthesia Type:General  Level of Consciousness: awake, alert , oriented and patient cooperative  Airway & Oxygen Therapy: Patient Spontanous Breathing and Patient connected to face mask oxygen  Post-op Assessment: Report given to RN, Post -op Vital signs reviewed and stable and Patient moving all extremities X 4  Post vital signs: Reviewed and stable  Last Vitals:  Vitals Value Taken Time  BP 142/84 02/18/21 1421  Temp 36.4 C 02/18/21 1421  Pulse 90 02/18/21 1424  Resp 17 02/18/21 1424  SpO2 99 % 02/18/21 1424  Vitals shown include unvalidated device data.  Last Pain:  Vitals:   02/18/21 1020  TempSrc:   PainSc: 3       Patients Stated Pain Goal: 3 ([id number])  Complications: No notable events documented.

## 2021-02-18 NOTE — Op Note (Signed)
PATIENT NAME: [name]   MEDICAL RECORD NO.:   [id number]    DATE OF BIRTH: 1955-07-29   DATE OF PROCEDURE: 02/18/2021                               OPERATIVE REPORT     PREOPERATIVE DIAGNOSES: 1. Bilateral cervical radiculopathy. 2. Spinal stenosis spanning C4-C7.   POSTOPERATIVE DIAGNOSES: 1. Bilateral cervical radiculopathy. 2. Spinal stenosis spanning C4-C7.   PROCEDURE: 1. Anterior cervical decompression and fusion C4/5, C5/6, C6/7. 2. Placement of anterior instrumentation, C4-C7. 3. Insertion of interbody device x 3 (Titan intervertebral spacers). 4. Intraoperative use of fluoroscopy. 5. Use of morselized allograft - ViviGen.   SURGEON:  Phylliss Bob, MD   ASSISTANT:  Pricilla Holm, PA-C.   ANESTHESIA:  General endotracheal anesthesia.   COMPLICATIONS:  None.   DISPOSITION:  Stable.   ESTIMATED BLOOD LOSS:  Minimal.   INDICATIONS FOR SURGERY:  Briefly, [name] is a pleasant 65 y.o. year- old patient, who did present to me with severe pain in the neck and bilateral Arms.  The patient's MRI did reveal the findings noted above.  Given the patient's ongoing rather debilitating pain and lack of improvement with appropriate treatment measures, we did discuss proceeding with the procedure noted above.  The patient was fully aware of the risks and limitations of surgery as outlined in my preoperative note.   OPERATIVE DETAILS:  On 02/18/2021,  the patient was brought to surgery and general endotracheal anesthesia was administered.  The patient was placed supine on the hospital bed. The neck was gently extended.  All bony prominences were meticulously padded.  The neck was prepped and draped in the usual sterile fashion.  At this point, I did make a left-sided transverse incision.  The platysma was incised.  A Smith-Robinson approach was used and the anterior spine was identified. A self-retaining retractor was placed.  I then subperiosteally exposed the  vertebral bodies from C4-C7.  Caspar pins were then placed into the C6 and C7 vertebral bodies and distraction was applied.  A thorough and complete C6-7 intervertebral diskectomy was performed.  The posterior longitudinal ligament was identified and entered using a nerve hook.  I then used #1 followed by #2 Kerrison to perform a thorough and complete intervertebral diskectomy.  The spinal canal was thoroughly decompressed, as was the right and left neuroforamen.  The endplates were then prepared and the appropriate-sized intervertebral spacer was then packed with ViviGen and tamped into position in the usual fashion.  The lower Caspar pin was then removed and placed into the C5 vertebral body and once again, distraction was applied across the C5-6 intervertebral space.  I then again performed a thorough and complete diskectomy, thoroughly decompressing the spinal canal and bilateral neuroforamena.  After preparing the endplates, the appropriate-sized intervertebral spacer was packed with ViviGen and tamped into position.  The lower Caspar pin was then removed and placed into the C4 vertebral body and once again, distraction was applied across the C4-5 intervertebral space.  I then again performed a thorough and complete diskectomy, thoroughly decompressing the spinal canal and bilateral neuroforamena.  After preparing the endplates, the appropriate-sized intervertebral spacer was packed with ViviGen and tamped into position.  The Caspar pins then were removed and bone wax was placed in their place.  The appropriate-sized anterior cervical plate was placed over the anterior spine.  14 mm variable angle screws were placed, 2  in each vertebral body from C4-C7 for a total of 8 vertebral body screws.  The screws were then locked to the plate using the Cam locking mechanism.  I was very pleased with the final fluoroscopic images.  The wound was then irrigated.  The wound was then explored for  any undue bleeding and there was no bleeding noted. The wound was then closed in layers using 2-0 Vicryl, followed by 4-0 Monocryl.  Benzoin and Steri-Strips were applied, followed by sterile dressing.  All instrument counts were correct at the termination of the procedure.   Of note, Pricilla Holm, PA-C, was my assistant throughout surgery, and did aid in retraction, suctioning, placement of the hardware, and closure from start to finish.         Phylliss Bob, MD

## 2021-02-18 NOTE — H&P (Signed)
PREOPERATIVE H&P  Chief Complaint: Bilateral arm pain  HPI: [name] is a 65 y.o. male who presents with ongoing pain in the bilateral arms  MRI reveals severe spinal stenosis spanning C4-C7  Patient has failed multiple forms of conservative care and continues to have pain (see office notes for additional details regarding the patient's full course of treatment)  Past Medical History:  Diagnosis Date   Arthritis    due to age   Cocaine abuse ([place])    quit   ETOH abuse    quit   Headache    Hx of adenomatous polyp of colon    Hyperlipidemia    Hypertension    PONV (postoperative nausea and vomiting)    Stroke ([place])    posterior limb, R internal capsule stroke 07/2012   Past Surgical History:  Procedure Laterality Date   bone spur repair Left    COLONOSCOPY     HAND SURGERY     left hand   Social History   Socioeconomic History   Marital status: Single    Spouse name: Not on file   Number of children: Not on file   Years of education: Not on file   Highest education level: Not on file  Occupational History   Not on file  Tobacco Use   Smoking status: Former    Types: Cigarettes    Quit date: 07/18/2012    Years since quitting: 8.5   Smokeless tobacco: Never  Vaping Use   Vaping Use: Never used  Substance and Sexual Activity   Alcohol use: No    Alcohol/week: 0.0 standard drinks    Comment: quit 2011   Drug use: No    Comment: h/o cocaine abuse   Sexual activity: Not on file  Other Topics Concern   Not on file  Social History Narrative   Not on file   Social Determinants of Health   Financial Resource Strain: Not on file  Food Insecurity: Not on file  Transportation Needs: Not on file  Physical Activity: Not on file  Stress: Not on file  Social Connections: Not on file   Family History  Problem Relation Age of Onset   Pancreatic cancer Father    Colon cancer Neg Hx    Esophageal cancer Neg Hx    Rectal cancer Neg Hx    Stomach  cancer Neg Hx    Allergies  Allergen Reactions   Ace Inhibitors Cough   Penicillins Hives and Rash   Prior to Admission medications   Medication Sig Start Date End Date Taking? Authorizing Provider  acetaminophen (TYLENOL) 500 MG tablet Take 1,000 mg by mouth every 6 (six) hours as needed for moderate pain or headache.   Yes [provider]  amLODipine (NORVASC) 10 MG tablet Take 1 tablet (10 mg total) by mouth daily. 08/17/20  Yes Jeralyn Bennett, MD  aspirin (EQ ASPIRIN ADULT LOW DOSE) 81 MG EC tablet TAKE ONE  BY MOUTH EVERY DAY Patient taking differently: Take 81 mg by mouth daily. 11/19/15  Yes Liberty Handy, MD  docusate sodium (COLACE) 50 MG capsule Take 100 mg by mouth daily as needed for mild constipation.   Yes [provider]  Ginseng 250 MG CAPS Take 250 mg by mouth daily.   Yes [provider]  hydrochlorothiazide (HYDRODIURIL) 25 MG tablet Take 1 tablet (25 mg total) by mouth daily. 09/07/20  Yes Katsadouros, Vasilios, MD  HYDROcodone-acetaminophen (NORCO/VICODIN) 5-325 MG tablet Take 1-2 tablets  by mouth every 6 (six) hours as needed for moderate pain.   Yes [provider]  ibuprofen (ADVIL) 200 MG tablet Take 400-600 mg by mouth every 6 (six) hours as needed for moderate pain or headache.   Yes [provider]  Menthol, Topical Analgesic, (BIOFREEZE EX) Apply 1 application topically daily as needed (neck pain).   Yes [provider]  Multiple Vitamins-Minerals (ADULT GUMMY PO) Take 2 capsules by mouth daily.   Yes [provider]  Omega-3 Fatty Acids (FISH OIL) 1000 MG CAPS Take 1,000 mg by mouth daily.   Yes [provider]  OVER THE COUNTER MEDICATION Take 3 tablets by mouth daily as needed (before sex). Nugenix testosterone booster otc supplement   Yes [provider]  rosuvastatin (CRESTOR) 20 MG tablet Take 1 tablet by mouth daily Patient taking differently: Take 20 mg by mouth daily. 11/10/20   Yes Mosetta Anis, MD  sildenafil (VIAGRA) 50 MG tablet TAKE 2 TABLETS BY MOUTH AS NEEDED FOR ERECTILE DYSFUNCTION (DO NOT TAKE MORE THAN 100 MG PER DAY) Patient taking differently: Take 100 mg by mouth as needed for erectile dysfunction. (DO NOT TAKE MORE THAN 100 MG PER DAY) 02/03/21  Yes Katsadouros, Vasilios, MD  omeprazole (PRILOSEC) 20 MG capsule Take 1 capsule (20 mg total) by mouth 2 (two) times daily. Patient not taking: No sig reported 09/07/20 09/07/21  Riesa Pope, MD  potassium chloride SA (KLOR-CON) 20 MEQ tablet Take 2 tablets (40 mEq total) by mouth 3 (three) times daily for 5 days. 02/15/21 02/20/21  Riesa Pope, MD     All other systems have been reviewed and were otherwise negative with the exception of those mentioned in the HPI and as above.  Physical Exam: There were no vitals filed for this visit.  There is no height or weight on file to calculate BMI.  General: Alert, no acute distress Cardiovascular: No pedal edema Respiratory: No cyanosis, no use of accessory musculature Skin: No lesions in the area of chief complaint Neurologic: Sensation intact distally Psychiatric: Patient is competent for consent with normal mood and affect Lymphatic: No axillary or cervical lymphadenopathy  Assessment/Plan: BILATERAL CERVICAL RADICULOPATHY Plan for Procedure(s): ANTERIOR CERVICAL DECOMPRESSION FUSION CERVIAL 4- CERVICAL 5, CERVICAL 5- CERVICAL 6, CERVICAL 6- CERVICAL 7 WITH INSTRUMENTATION AND ALLOGRAFT   Norva Karvonen, MD 02/18/2021 7:52 AM

## 2021-02-18 NOTE — Anesthesia Preprocedure Evaluation (Addendum)
Anesthesia Evaluation  Patient identified by MRN, date of birth, ID band Patient awake    Reviewed: Allergy & Precautions, H&P , NPO status , Patient's Chart, lab work & pertinent test results, reviewed documented beta blocker date and time   Airway Mallampati: I  TM Distance: >3 FB Neck ROM: full    Dental no notable dental hx. (+) Partial Upper, Poor Dentition, Missing Missing many:   Pulmonary neg pulmonary ROS, former smoker,    Pulmonary exam normal breath sounds clear to auscultation       Cardiovascular Exercise Tolerance: Good hypertension, Pt. on medications  Rhythm:regular Rate:Normal     Neuro/Psych  Headaches, PSYCHIATRIC DISORDERS CVA, No Residual Symptoms    GI/Hepatic Neg liver ROS, GERD  Medicated and Controlled,  Endo/Other  negative endocrine ROS  Renal/GU negative Renal ROS  negative genitourinary   Musculoskeletal  (+) Arthritis , Osteoarthritis,    Abdominal   Peds  Hematology negative hematology ROS (+)   Anesthesia Other Findings   Reproductive/Obstetrics negative OB ROS                             Anesthesia Physical Anesthesia Plan  ASA: 3  Anesthesia Plan: General   Post-op Pain Management:    Induction: Intravenous  PONV Risk Score and Plan: 2 and Ondansetron and Treatment may vary due to age or medical condition  Airway Management Planned: Oral ETT and Video Laryngoscope Planned  Additional Equipment: None  Intra-op Plan:   Post-operative Plan: Extubation in OR  Informed Consent: I have reviewed the patients History and Physical, chart, labs and discussed the procedure including the risks, benefits and alternatives for the proposed anesthesia with the patient or authorized representative who has indicated his/her understanding and acceptance.     Dental Advisory Given  Plan Discussed with: CRNA and Anesthesiologist  Anesthesia Plan Comments:         Anesthesia Quick Evaluation

## 2021-02-19 ENCOUNTER — Encounter (HOSPITAL_COMMUNITY): Payer: Self-pay | Admitting: Orthopedic Surgery

## 2021-02-19 MED FILL — Thrombin For Soln Kit 20000 Unit: CUTANEOUS | Qty: 1 | Status: AC

## 2021-02-19 NOTE — Anesthesia Postprocedure Evaluation (Signed)
Anesthesia Post Note  Patient: [name]  Procedure(s) Performed: ANTERIOR CERVICAL DECOMPRESSION FUSION CERVIAL 4- CERVICAL 5, CERVICAL 5- CERVICAL 6, CERVICAL 6- CERVICAL 7 WITH INSTRUMENTATION AND ALLOGRAFT (Spine Cervical)     Patient location during evaluation: PACU Anesthesia Type: General Level of consciousness: sedated and patient cooperative Pain management: pain level controlled Vital Signs Assessment: post-procedure vital signs reviewed and stable Respiratory status: spontaneous breathing Cardiovascular status: stable Anesthetic complications: no   No notable events documented.  Last Vitals:  Vitals:   02/18/21 1505 02/18/21 1520  BP: 132/86 (!) 136/97  Pulse: 91 92  Resp: (!) 23 20  Temp:  36.4 C  SpO2: 97% 94%    Last Pain:  Vitals:   02/18/21 1450  TempSrc:   PainSc: [facility]

## 2021-03-11 ENCOUNTER — Ambulatory Visit (INDEPENDENT_AMBULATORY_CARE_PROVIDER_SITE_OTHER): Payer: 59 | Admitting: Internal Medicine

## 2021-03-11 ENCOUNTER — Encounter: Payer: Self-pay | Admitting: Internal Medicine

## 2021-03-11 ENCOUNTER — Other Ambulatory Visit: Payer: Self-pay

## 2021-03-11 VITALS — BP 122/90 | HR 69 | Temp 97.5°F | Ht 67.0 in | Wt 159.1 lb

## 2021-03-11 DIAGNOSIS — N529 Male erectile dysfunction, unspecified: Secondary | ICD-10-CM

## 2021-03-11 MED ORDER — TADALAFIL 20 MG PO TABS: 20.0000 mg | ORAL_TABLET | Freq: Every day | ORAL | 2 refills | Status: DC | PRN

## 2021-03-11 NOTE — Progress Notes (Signed)
   CC: Erectile dysfunction  HPI:  [name] is a 65 y.o. with a past medical history listed below presenting for erectile dysfunction. For details of today's visit and the status of his chronic medical issues please refer to the assessment and plan.   Past Medical History:  Diagnosis Date   Arthritis    due to age   Cocaine abuse ([place])    quit   ETOH abuse    quit   Headache    Hx of adenomatous polyp of colon    Hyperlipidemia    Hypertension    PONV (postoperative nausea and vomiting)    Stroke [facility])    posterior limb, R internal capsule stroke 07/2012   Review of Systems: Negative except as per assessment plan  Physical Exam:  Vitals:   03/11/21 1408  BP: 122/90  Pulse: 69  Temp: (!) 97.5 F (36.4 C)  TempSrc: Oral  SpO2: 99%  Weight: 159 lb 1.6 oz (72.2 kg)  Height: '5\' 7"'$  (1.702 m)    Physical Exam General: alert, appears stated age, in no acute distress HEENT: Normocephalic, atraumatic, EOM intact, conjunctiva normal CV: Regular rate and rhythm, no murmurs rubs or gallops Pulm: Clear to auscultation bilaterally, normal work of breathing Abdomen: Soft, nondistended, bowel sounds present, no tenderness to palpation MSK: No lower extremity edema Skin: Warm and dry Neuro: Alert and oriented x3   Assessment & Plan:   See Encounters Tab for problem based charting.  Patient discussed with Dr. Evette Doffing

## 2021-03-11 NOTE — Assessment & Plan Note (Signed)
Patient presents today to discuss switching his medication from sildenafil to tadalafil.  States that he is looking for better performance.  He is not having any issues with erection on Viagra.  He was previously on tadalafil 10 mg and this was increased when he was having trouble with erections up to 20 mg.  This was still not working for him and that was the reason he was switched to sildenafil.  He is still interested in transitioning back to tadalafil.  -Switch sildenafil 100 mg to tadalafil 20 mg as needed -Patient will follow-up if this does not work for him

## 2021-03-11 NOTE — Patient Instructions (Signed)
I have sent in a prescription for Cialis 20 mg as needed.  If you have any difficulties with performance on this medication please give [name] a call.

## 2021-03-16 NOTE — Progress Notes (Signed)
Internal Medicine Clinic Attending ° °Case discussed with Dr. Rehman  At the time of the visit.  We reviewed the resident’s history and exam and pertinent patient test results.  I agree with the assessment, diagnosis, and plan of care documented in the resident’s note.  ° °

## 2021-05-04 ENCOUNTER — Other Ambulatory Visit: Payer: Self-pay | Admitting: Internal Medicine

## 2021-05-04 DIAGNOSIS — Z8673 Personal history of transient ischemic attack (TIA), and cerebral infarction without residual deficits: Secondary | ICD-10-CM

## 2021-08-04 ENCOUNTER — Encounter: Payer: Self-pay | Admitting: Internal Medicine

## 2021-08-04 ENCOUNTER — Other Ambulatory Visit: Payer: Self-pay

## 2021-08-04 ENCOUNTER — Ambulatory Visit (INDEPENDENT_AMBULATORY_CARE_PROVIDER_SITE_OTHER): Payer: Medicare Other | Admitting: Internal Medicine

## 2021-08-04 DIAGNOSIS — I10 Essential (primary) hypertension: Secondary | ICD-10-CM | POA: Diagnosis not present

## 2021-08-04 DIAGNOSIS — Z8673 Personal history of transient ischemic attack (TIA), and cerebral infarction without residual deficits: Secondary | ICD-10-CM | POA: Diagnosis not present

## 2021-08-04 DIAGNOSIS — R0989 Other specified symptoms and signs involving the circulatory and respiratory systems: Secondary | ICD-10-CM

## 2021-08-04 DIAGNOSIS — R051 Acute cough: Secondary | ICD-10-CM

## 2021-08-04 DIAGNOSIS — N529 Male erectile dysfunction, unspecified: Secondary | ICD-10-CM | POA: Diagnosis not present

## 2021-08-04 MED ORDER — ROSUVASTATIN CALCIUM 20 MG PO TABS: 20.0000 mg | ORAL_TABLET | Freq: Every day | ORAL | 3 refills | Status: DC

## 2021-08-04 MED ORDER — PANTOPRAZOLE SODIUM 40 MG PO TBEC: 40.0000 mg | DELAYED_RELEASE_TABLET | Freq: Every day | ORAL | 0 refills | Status: DC

## 2021-08-04 MED ORDER — TADALAFIL 20 MG PO TABS: 20.0000 mg | ORAL_TABLET | Freq: Every day | ORAL | 2 refills | Status: DC | PRN

## 2021-08-04 MED ORDER — HYDROCHLOROTHIAZIDE 25 MG PO TABS: 25.0000 mg | ORAL_TABLET | Freq: Every day | ORAL | 3 refills | Status: DC

## 2021-08-04 MED ORDER — AMLODIPINE BESYLATE 10 MG PO TABS: 10.0000 mg | ORAL_TABLET | Freq: Every day | ORAL | 3 refills | Status: DC

## 2021-08-04 NOTE — Patient Instructions (Addendum)
To Mr. Dinapoli,   It was a pleasure meeting you today! Today we talked about your throat irritation with cough. Your symptoms could be due to several things including reflux or a viral respiratory condition.   We are going to trial two medications for viral respiratory condition. Fluticasone nasal spray (2 sprays per nostril daily) and Claritin. Both are over the counter. We will trial this for 2 weeks, and if there is no improvement in your symptoms, we will have you pick up your pantoprazole (Protonix) and take one pill daily for 2 weeks for reflux. Please reach back out to the clinic if your symptoms do not improve.  Have a good day!  Maudie Mercury, MD

## 2021-08-05 LAB — SARS-COV-2, NAA 2 DAY TAT

## 2021-08-05 LAB — NOVEL CORONAVIRUS, NAA: SARS-CoV-2, NAA: NOT DETECTED

## 2021-08-05 NOTE — Progress Notes (Signed)
° °  CC: itching throat and cough  HPI:  [name] is a 66 y.o. person, with a PMH noted below, who presents to the clinic itching throat and cough. To see the management of their acute and chronic conditions, please see the A&P note under the Encounters tab.   Past Medical History:  Diagnosis Date   Arthritis    due to age   Cocaine abuse ([place])    quit   ETOH abuse    quit   Headache    Hx of adenomatous polyp of colon    Hyperlipidemia    Hypertension    PONV (postoperative nausea and vomiting)    Stroke [facility])    posterior limb, R internal capsule stroke 07/2012   Review of Systems:   Review of Systems  Constitutional:  Negative for chills, fever and weight loss.  HENT:  Positive for sore throat. Negative for congestion, ear discharge, ear pain, hearing loss, nosebleeds, sinus pain and tinnitus.   Respiratory:  Positive for cough. Negative for hemoptysis, sputum production, shortness of breath and wheezing.        Itchy throat  Gastrointestinal:  Negative for abdominal pain, diarrhea, nausea and vomiting.  Genitourinary:  Negative for dysuria, frequency and urgency.  Skin:  Negative for rash.  Neurological:  Negative for dizziness, tingling and headaches.    Physical Exam:  Vitals:   08/04/21 1034  BP: 130/81  Pulse: 68  Temp: 98.3 F (36.8 C)  TempSrc: Oral  SpO2: 98%  Weight: 167 lb 1.6 oz (75.8 kg)   Physical Exam Constitutional:      General: He is not in acute distress.    Appearance: Normal appearance. He is not ill-appearing, toxic-appearing or diaphoretic.  HENT:     Head: Normocephalic and atraumatic.     Right Ear: Tympanic membrane and external ear normal.     Left Ear: Tympanic membrane and external ear normal.     Nose: Nose normal. No congestion or rhinorrhea.     Mouth/Throat:     Mouth: Mucous membranes are moist.     Pharynx: No oropharyngeal exudate or posterior oropharyngeal erythema.     Comments: No cobblestoning  present Cardiovascular:     Rate and Rhythm: Normal rate and regular rhythm.     Pulses: Normal pulses.     Heart sounds: Normal heart sounds. No murmur heard.   No friction rub. No gallop.  Musculoskeletal:     Cervical back: Normal range of motion and neck supple. No tenderness.  Lymphadenopathy:     Cervical: No cervical adenopathy.  Neurological:     Mental Status: He is alert.     Assessment & Plan:   See Encounters Tab for problem based charting.  Patient discussed with Dr. Jimmye Norman

## 2021-08-06 DIAGNOSIS — R059 Cough, unspecified: Secondary | ICD-10-CM | POA: Insufficient documentation

## 2021-08-06 NOTE — Assessment & Plan Note (Addendum)
Patient presents with 10 days of cough and a "itchy, sore throat."  He states that he may have some nasal congestion, but denies any fevers, productive cough, nausea, vomiting, chest pain, shortness of breath, abdominal pain, myalgias, arthralgias, or rhinorrhea.  He states that his cough appears to be worse at night, is alleviated when he lays on extra pillows. He does not notice any reflux symptoms.  He is taking over-the-counter cough medicines that have not been working.  He states that he had family members see him over the holiday seasons, and the left 2 weeks ago.  He denies sick contacts.  A/P: Patient presents with 10 days of an itchy, sore throat with associated cough.  Physical exam there was no appreciable cobblestoning or erythema of the throat, has no discharge no signs of congestion, which does not suggest a viral URI.  Lungs are clear to auscultation bilaterally, and his cardiac exam is unremarkable.  Given the "itchiness" this could be likely due to eustachian tube dysfunction.  Another common cause of irritation that is worse at night is reflux.  We will treat stepwise manner.  He has undergone COVID testing and given his symptoms and close contact with his family, it is appropriate. - COVID testing today - Fluticasone 2 sprays per nare for 2 weeks with daily antihistamine - If symptoms do not improve with initial 2 weeks, Protonix 20 mg daily for 2 weeks - Symptoms do not improve after 4 weeks  Addendum:  Updated patient on negative COVID result

## 2021-08-10 ENCOUNTER — Other Ambulatory Visit: Payer: Self-pay

## 2021-08-10 ENCOUNTER — Ambulatory Visit (INDEPENDENT_AMBULATORY_CARE_PROVIDER_SITE_OTHER): Payer: Medicare Other | Admitting: Internal Medicine

## 2021-08-10 DIAGNOSIS — R051 Acute cough: Secondary | ICD-10-CM | POA: Diagnosis not present

## 2021-08-10 MED ORDER — GUAIFENESIN-CODEINE 100-10 MG/5ML PO SYRP
5.0000 mL | ORAL_SOLUTION | Freq: Four times a day (QID) | ORAL | 0 refills | Status: DC | PRN
  Filled 2021-08-10: qty 473, fill #0
  Filled 2021-08-10: qty 473, 23d supply, fill #0

## 2021-08-10 MED ORDER — IPRATROPIUM BROMIDE 0.03 % NA SOLN
2.0000 | Freq: Two times a day (BID) | NASAL | 12 refills | Status: DC
  Filled 2021-08-10: qty 30, 75d supply, fill #0

## 2021-08-10 MED ORDER — PANTOPRAZOLE SODIUM 40 MG PO TBEC
40.0000 mg | DELAYED_RELEASE_TABLET | Freq: Every day | ORAL | 0 refills | Status: DC
  Filled 2021-08-10: qty 30, 30d supply, fill #0

## 2021-08-10 NOTE — Patient Instructions (Addendum)
[name],   It was a pleasure seeing you again, today we discussed your worsening cough. We are going to start you on another nasal spray called Atrovent, please take two sprays per nostril every 12 hours as needed. Additionally, I am going to give you a cough suppressant, please take this every 6 hours as needed. Lastly, I have prescribed pantoprazole for reflux. Please take this daily.  Please call for a follow up if your cough does not improve in 2 weeks.  Have a good day,  Maudie Mercury, MD

## 2021-08-11 NOTE — Assessment & Plan Note (Addendum)
Patient present for follow up on his cough. He states that he took Flonase for several days with little effect and threw out the medication. Unfortunately, he is in between insurances at the moment and was unable to fill the pantoprazole as it was $60.00. He states that his cough has gotten worse and keeps him up at night. He denies fevers, nausea, vomiting, or systemic signs of infection. His COVID test was negative. On physical examination he has some cobblestoning present today, his nose has signs of congestion and is pale, which suggests an allergic component, but has has not had any changes in his daily life or home setting. His ears are clear of cerumen, no effusions present, lungs are clear bilaterally. His symptoms have continued for close to 3 weeks. He does have an increase in cough, which sounds congested, but without production. While his symptoms may be secondary to viral URI, he has no associated concerns. He has some nasal congestion and cobblestoning present. Given the voice hoarseness as well concerned for possible GERD or LPR, but the patient was unable to afford PPI therapy. He was given dextromethorphan by his pharmacist that he takes regularly. Will treat symptomatically today, will additionally fill out Protonix at  [facility] as he is in between insurance, Protonix is $10.00 for a 30 day supply. For nasal congestion we will start Atrovent. Will additionally treat with a stronger cough suppressant today.  - Protonix 40 mg daily - Robitussin AC 100-10 mg/62mL Q6H PRN for cough  - Atrovent 2 spray per nares as needed

## 2021-08-11 NOTE — Progress Notes (Signed)
CC: Follow up cough and sore throat  HPI:  [name] is a 66 y.o. person, with a PMH noted below, who presents to the clinic Follow up cough and sore throat. To see the management of their acute and chronic conditions, please see the A&P note under the Encounters tab.   Past Medical History:  Diagnosis Date   Arthritis    due to age   Cocaine abuse ([place])    quit   ETOH abuse    quit   Headache    Hx of adenomatous polyp of colon    Hyperlipidemia    Hypertension    PONV (postoperative nausea and vomiting)    Stroke [facility])    posterior limb, R internal capsule stroke 07/2012   Review of Systems:   Review of Systems  Constitutional:  Negative for chills, fever, malaise/fatigue and weight loss.  HENT:  Positive for congestion and sore throat. Negative for ear discharge, ear pain, hearing loss, nosebleeds, sinus pain and tinnitus.   Eyes:  Negative for blurred vision, double vision, photophobia and pain.  Respiratory:  Positive for cough. Negative for hemoptysis, shortness of breath, wheezing and stridor.        Coughing up clear mucus at night  Cardiovascular:  Negative for chest pain and palpitations.  Gastrointestinal:  Negative for abdominal pain, diarrhea, heartburn, nausea and vomiting.  Musculoskeletal:  Negative for myalgias and neck pain.  Skin:  Negative for rash.  Neurological:  Negative for dizziness and headaches.    Physical Exam:  Vitals:   08/10/21 1505  BP: (!) 166/99  Pulse: 78  Temp: 98.1 F (36.7 C)  TempSrc: Oral  Weight: 164 lb 8 oz (74.6 kg)   Physical Exam Constitutional:      General: He is not in acute distress.    Appearance: He is not ill-appearing, toxic-appearing or diaphoretic.     Comments: Answers questions appropriately, NAD,   HENT:     Head: Normocephalic and atraumatic.     Comments: No tenderness to the maxillary or frontal sinus    Right Ear: Tympanic membrane, ear canal and external ear normal. There is no impacted  cerumen.     Left Ear: Tympanic membrane, ear canal and external ear normal. There is no impacted cerumen.     Nose: Congestion present.     Comments: Congestion with pale turbinates.     Mouth/Throat:     Mouth: Mucous membranes are moist.     Pharynx: No oropharyngeal exudate or posterior oropharyngeal erythema.     Comments: Cobblestoning present Eyes:     General:        Right eye: No discharge.        Left eye: No discharge.     Conjunctiva/sclera: Conjunctivae normal.     Pupils: Pupils are equal, round, and reactive to light.  Cardiovascular:     Rate and Rhythm: Normal rate and regular rhythm.     Pulses: Normal pulses.     Heart sounds: Normal heart sounds.  Pulmonary:     Effort: Pulmonary effort is normal. No respiratory distress.     Breath sounds: Normal breath sounds. No stridor. No wheezing, rhonchi or rales.  Musculoskeletal:     Cervical back: No rigidity or tenderness.  Neurological:     Mental Status: He is alert and oriented to person, place, and time.  Psychiatric:        Mood and Affect: Mood normal.  Behavior: Behavior normal.     Assessment & Plan:   See Encounters Tab for problem based charting.  Patient discussed with Dr. Jimmye Norman

## 2021-08-12 ENCOUNTER — Other Ambulatory Visit: Payer: Self-pay

## 2021-08-12 ENCOUNTER — Other Ambulatory Visit (HOSPITAL_COMMUNITY)
Admission: RE | Admit: 2021-08-12 | Discharge: 2021-08-12 | Disposition: A | Discharge status: Home / Self Care | Payer: Medicare Other | Source: Ambulatory Visit | Attending: Internal Medicine | Admitting: Internal Medicine

## 2021-08-12 ENCOUNTER — Ambulatory Visit (HOSPITAL_COMMUNITY)
Admission: RE | Admit: 2021-08-12 | Discharge: 2021-08-12 | Disposition: A | Discharge status: Home / Self Care | Payer: Medicare Other | Source: Ambulatory Visit | Attending: Internal Medicine | Admitting: Internal Medicine

## 2021-08-12 ENCOUNTER — Ambulatory Visit (INDEPENDENT_AMBULATORY_CARE_PROVIDER_SITE_OTHER): Payer: Medicare Other | Admitting: Internal Medicine

## 2021-08-12 ENCOUNTER — Telehealth: Payer: Self-pay | Admitting: Student

## 2021-08-12 ENCOUNTER — Encounter: Payer: Self-pay | Admitting: Internal Medicine

## 2021-08-12 VITALS — BP 137/97 | HR 79

## 2021-08-12 DIAGNOSIS — Z113 Encounter for screening for infections with a predominantly sexual mode of transmission: Secondary | ICD-10-CM | POA: Diagnosis not present

## 2021-08-12 DIAGNOSIS — R918 Other nonspecific abnormal finding of lung field: Secondary | ICD-10-CM | POA: Diagnosis not present

## 2021-08-12 DIAGNOSIS — R051 Acute cough: Secondary | ICD-10-CM | POA: Diagnosis not present

## 2021-08-12 DIAGNOSIS — R059 Cough, unspecified: Secondary | ICD-10-CM | POA: Diagnosis not present

## 2021-08-12 NOTE — Telephone Encounter (Signed)
Called pt who stated he was seen 2 days ago and not "getting any relief". He continues to cough to the point his chest hurts.  He's agreeable for a telehealth appt  - appt schedule with Dr Gilford Rile @ 0915 AM this morning.

## 2021-08-12 NOTE — Patient Instructions (Signed)
[name],   It was a pleasure seeing you again. Today we are doing further testing for your cough. Upon further discussion, we will have a screening for sexually transmitted illness. We will obtain a throat swab, urine analysis, and blood work to test for the most common sexually transmitted illnesses. II am also going to get an xray of your lungs to ensure there is no lung involvement. I will call you with the results of your lab work, chest imaging, and our next steps. Please continue taking your current medications that have been prescribed.  Maudie Mercury, MD

## 2021-08-12 NOTE — Progress Notes (Signed)
° °  CC: Cough and STI screening  HPI:  [name] is a 66 y.o. person, with a PMH noted below, who presents to the clinic for follow up and STI screening. To see the management of their acute and chronic conditions, please see the A&P note under the Encounters tab.   Past Medical History:  Diagnosis Date   Arthritis    due to age   Cocaine abuse ([place])    quit   ETOH abuse    quit   Headache    Hx of adenomatous polyp of colon    Hyperlipidemia    Hypertension    PONV (postoperative nausea and vomiting)    Stroke [facility])    posterior limb, R internal capsule stroke 07/2012   Review of Systems:   Review of Systems  Constitutional:  Negative for chills, diaphoresis, fever, malaise/fatigue and weight loss.  HENT:  Positive for sore throat. Negative for ear discharge, ear pain, nosebleeds and sinus pain.   Respiratory:  Positive for cough. Negative for hemoptysis, sputum production, shortness of breath and wheezing.   Gastrointestinal:  Negative for abdominal pain, diarrhea, nausea and vomiting.  Genitourinary:  Negative for dysuria, frequency, hematuria and urgency.       No urethral discharge  Musculoskeletal:  Negative for back pain, falls, joint pain, myalgias and neck pain.  Skin:  Negative for itching and rash.  Neurological:  Negative for dizziness and headaches.    Physical Exam:  Vitals:   08/12/21 1048 08/12/21 1052  BP: (!) 139/103 (!) 137/97  Pulse: 84 79  SpO2: 98%    Physical Exam Constitutional:      General: He is not in acute distress.    Appearance: He is well-developed. He is not ill-appearing, toxic-appearing or diaphoretic.  HENT:     Head: Normocephalic and atraumatic.     Nose: Congestion present.     Mouth/Throat:     Pharynx: Uvula midline. Posterior oropharyngeal erythema present. No oropharyngeal exudate or uvula swelling.  Cardiovascular:     Rate and Rhythm: Normal rate and regular rhythm.     Heart sounds: Normal heart sounds.   Pulmonary:     Effort: Pulmonary effort is normal.     Breath sounds: Normal breath sounds. No wheezing.  Skin:    General: Skin is warm.     Findings: No erythema or rash.  Neurological:     Mental Status: He is alert and oriented to person, place, and time.  Psychiatric:        Mood and Affect: Mood normal.        Behavior: Behavior normal.     Assessment & Plan:   See Encounters Tab for problem based charting.  Patient discussed with Dr. Dareen Piano

## 2021-08-12 NOTE — Telephone Encounter (Signed)
Pt states the medications are not working listed below.  Patient states his cough isnot getting any better.  Patient reporting soreness and chest when coughing.    guaiFENesin-codeine (ROBITUSSIN AC) 100-10 MG/5ML syrup  ipratropium (ATROVENT) 0.03 % nasal spray  pantoprazole (PROTONIX) 40 MG tablet

## 2021-08-12 NOTE — Progress Notes (Signed)
Internal Medicine Clinic Attending ? ?Case discussed with Dr. Winters  At the time of the visit.  We reviewed the resident?s history and exam and pertinent patient test results.  I agree with the assessment, diagnosis, and plan of care documented in the resident?s note.  ?

## 2021-08-13 DIAGNOSIS — Z113 Encounter for screening for infections with a predominantly sexual mode of transmission: Secondary | ICD-10-CM | POA: Insufficient documentation

## 2021-08-13 LAB — CYTOLOGY, (ORAL, ANAL, URETHRAL) ANCILLARY ONLY
Chlamydia: NEGATIVE
Comment: NEGATIVE
Comment: NORMAL
Neisseria Gonorrhea: NEGATIVE

## 2021-08-13 LAB — URINE CYTOLOGY ANCILLARY ONLY
Chlamydia: NEGATIVE
Comment: NEGATIVE
Comment: NORMAL
Neisseria Gonorrhea: NEGATIVE

## 2021-08-13 MED ORDER — AZITHROMYCIN 250 MG PO TABS: ORAL_TABLET | ORAL | 0 refills | Status: AC

## 2021-08-13 NOTE — Assessment & Plan Note (Addendum)
Mr. Geraci initially presented this morning for a tele health visit to further discuss his cough. He admits that he was not being completely forthright when initially discussed his symptoms. His symptoms started several days after having unprotected oral and vaginal intercourse with a male partner. He has known this partner in the past but this is the first time they have had intercourse. He denies any urethral discharge, dysuria, urinary frequency, or hesitency. Mr. Lerette states taht he was told she had herpes and is concerned that his symptoms may be related to their meeting.   A/P:  Patient instructed to come into clinic for further testing. We discussed sexually transmitted illnesses in the office. Patient is willing to undergo STI screening at this time. Will obtain oropharyngeal swab for gonococcal pharyngitis, will additionally collect a urine sample and blood work.  - HIV w/ reflex - RPR - Hep B surface antibody - oropharyngeal swab - urine cytology with G&C - CXR  Addendum:  CXR showing Reticulonodular opacities at the base of the R lung. Spoke with the patient and will treat for atypical pneumonia. - Azithromycin 500 mg for 1 day followed by 250 mg daily for 4 days  Addendum: 08/16/21:  - tried to call patient for pan negative results, sent to VM.

## 2021-08-13 NOTE — Assessment & Plan Note (Signed)
Please see cough on 08/12/2021

## 2021-08-14 LAB — RPR: RPR Ser Ql: NONREACTIVE

## 2021-08-14 LAB — HIV ANTIBODY (ROUTINE TESTING W REFLEX): HIV Screen 4th Generation wRfx: NONREACTIVE

## 2021-08-14 LAB — HEPATITIS B SURFACE ANTIBODY,QUALITATIVE: Hep B Surface Ab, Qual: NONREACTIVE

## 2021-08-16 NOTE — Progress Notes (Signed)
Internal Medicine Clinic Attending ? ?Case discussed with Dr. Winters  At the time of the visit.  We reviewed the resident?s history and exam and pertinent patient test results.  I agree with the assessment, diagnosis, and plan of care documented in the resident?s note.  ?

## 2021-08-17 NOTE — Progress Notes (Signed)
Internal Medicine Clinic Attending ? ?Case discussed with Dr. Winters  At the time of the visit.  We reviewed the resident?s history and exam and pertinent patient test results.  I agree with the assessment, diagnosis, and plan of care documented in the resident?s note.  ?

## 2021-09-21 ENCOUNTER — Encounter: Payer: Self-pay | Admitting: Internal Medicine

## 2021-09-21 ENCOUNTER — Telehealth: Payer: Self-pay | Admitting: *Deleted

## 2021-09-21 ENCOUNTER — Ambulatory Visit (INDEPENDENT_AMBULATORY_CARE_PROVIDER_SITE_OTHER): Payer: Medicare Other | Admitting: Internal Medicine

## 2021-09-21 DIAGNOSIS — Z0000 Encounter for general adult medical examination without abnormal findings: Secondary | ICD-10-CM | POA: Diagnosis not present

## 2021-09-21 DIAGNOSIS — K219 Gastro-esophageal reflux disease without esophagitis: Secondary | ICD-10-CM | POA: Diagnosis not present

## 2021-09-21 DIAGNOSIS — M545 Low back pain, unspecified: Secondary | ICD-10-CM | POA: Diagnosis not present

## 2021-09-21 IMAGING — RF DG CERVICAL SPINE 1V
1 series · 2 of 2 positions shown · non-contrast
Comparison: Cervical spine radiographs 07/25/2019. Cervical spine
MRI 11/05/2020.

CLINICAL DATA: Surgery, elective L28.H (C6A-R5-CM). Additional
history provided by [HOSPITAL]-C7 ACDF. Provided fluoroscopy
time 12 seconds (2.04 mGy).

EXAM:
DG C-ARM 1-60 MIN; DG CERVICAL SPINE - 1 VIEW

[Series 1: run · 2 of 2 slices shown]
[im 1/2]
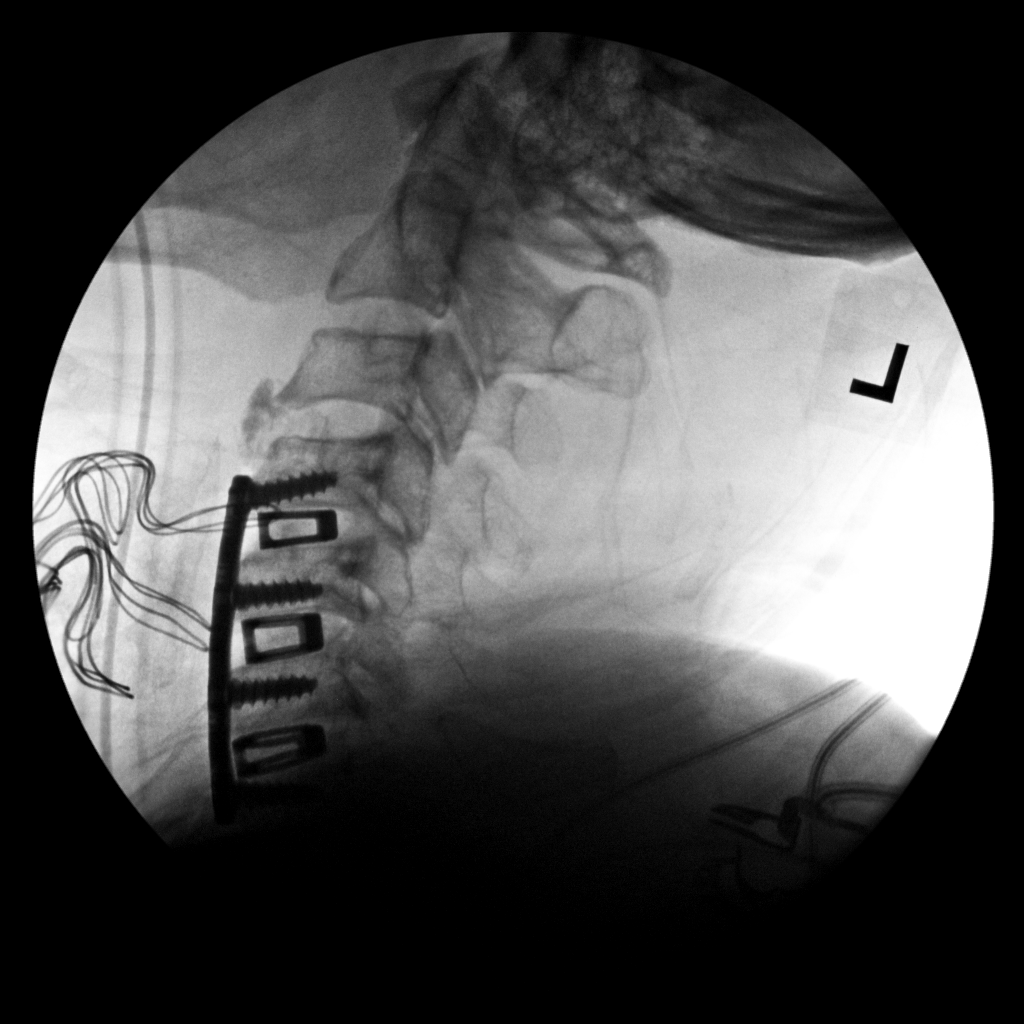
[im 2/2]
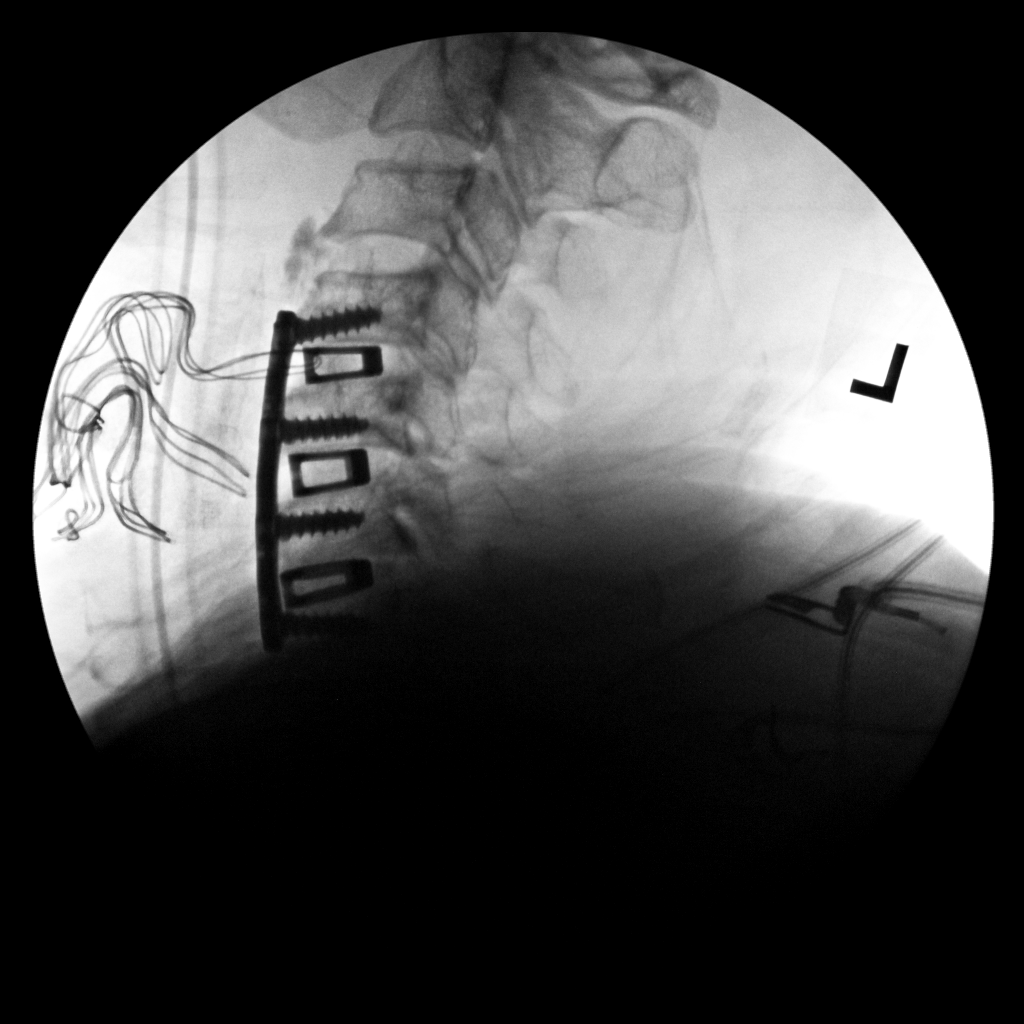

[2 of 2 positions shown; findings below may reference images not displayed]

FINDINGS: Two intraoperative lateral view fluoroscopic images of the cervical
spine are submitted. On the provided images, ACDF hardware spans the
C4-C7 levels (ventral plate and screws, as well as interbody
devices). Curvilinear hyperdensity projects ventral to the cervical
spine at the operative levels, likely reflecting surgical
sponges/packing material. Partially visualized ET tube.
IMPRESSION: Two lateral view intraoperative fluoroscopic images of the cervical
spine from C4-C7 ACDF, as described.

Curvilinear hyperdensity projects ventral to the cervical spine at
the operative levels, likely reflecting surgical sponges/packing
material. Correlate with the operative history.

## 2021-09-21 MED ORDER — DICLOFENAC SODIUM 1 % EX GEL: 2.0000 g | Freq: Four times a day (QID) | CUTANEOUS | 0 refills | Status: AC

## 2021-09-21 MED ORDER — PANTOPRAZOLE SODIUM 40 MG PO TBEC: 40.0000 mg | DELAYED_RELEASE_TABLET | Freq: Every day | ORAL | 0 refills | Status: DC

## 2021-09-21 MED ORDER — METHOCARBAMOL 500 MG PO TABS: 500.0000 mg | ORAL_TABLET | Freq: Four times a day (QID) | ORAL | 0 refills | Status: AC

## 2021-09-21 MED ORDER — LIDOCAINE 5 % EX PTCH: 1.0000 | MEDICATED_PATCH | Freq: Two times a day (BID) | CUTANEOUS | 0 refills | Status: DC

## 2021-09-21 NOTE — Progress Notes (Addendum)
? ?  CC: right sided chest pain ? ?HPI:[name] is a 66 y.o. male who presents for evaluation of back pain. Please see individual problem based A/P for details. ? ? ? ?Depression, PHQ-9: ?Based on the patients  ?[place] Office Visit from 03/11/2021 in [place]  ?PHQ-9 Total Score 0  ? ?  ? score we have 0. ? ?Past Medical History:  ?Diagnosis Date  ? Arthritis   ? due to age  ? Cocaine abuse ([place])   ? quit  ? ETOH abuse   ? quit  ? Headache   ? Hx of adenomatous polyp of colon   ? Hyperlipidemia   ? Hypertension   ? PONV (postoperative nausea and vomiting)   ? Stroke [facility])   ? posterior limb, R internal capsule stroke 07/2012  ? ?Review of Systems:   ?Review of Systems  ?Constitutional: Negative.   ?HENT: Negative.    ?Eyes: Negative.   ?Respiratory: Negative.    ?Cardiovascular: Negative.   ?Gastrointestinal: Negative.   ?Genitourinary: Negative.   ?Musculoskeletal:  Positive for back pain.  ?Skin: Negative.   ?Neurological: Negative.   ?Psychiatric/Behavioral: Negative.     ? ?Physical Exam: ?Vitals:  ? 09/21/21 1020  ?BP: 135/84  ?Pulse: 74  ?Temp: 97.8 ?F (36.6 ?C)  ?TempSrc: Oral  ?SpO2: 98%  ?Weight: 167 lb 9.6 oz (76 kg)  ?Height: '5\' 8"'$  (1.727 m)  ? ? ? ?General: alert and oriented ?HEENT: Conjunctiva nl , antiicteric sclerae, moist mucous membranes, no exudate or erythema ?Cardiovascular: Normal rate, regular rhythm.  No murmurs, rubs, or gallops ?Pulmonary : Equal breath sounds, No wheezes, rales, or rhonchi ?Abdominal: soft, nontender,  bowel sounds present ?Ext: No edema in lower extremities, no tenderness to palpation of lower extremities.  ? ?Assessment & Plan:  ? ?See Encounters Tab for problem based charting. ? ?Patient discussed with Dr.  Saverio Danker ? ?

## 2021-09-21 NOTE — Assessment & Plan Note (Addendum)
Patient reports receiving flu shot sometime around December 2022 through [facility]. ?Updated chart. ?

## 2021-09-21 NOTE — Assessment & Plan Note (Signed)
Patient reporting symptoms of reflux. Previously prescribed protonix. No trouble swallowing. ?Refilled prescription.  ?

## 2021-09-21 NOTE — Telephone Encounter (Signed)
Agree with plan. Thank you

## 2021-09-21 NOTE — Assessment & Plan Note (Signed)
has been doing a lot of lifting, does yard work, had not done anything until recently due to neck surgery back in august. 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 Tyelnol is not helping, heating pad does help. No weakness or changes in sensationPain with deep inspiration. No SOB, cough, diaphoresis, or nausea or vomiting. No fevers.  ?Tender to palpation along paraspinal msk right side. Slight pain with deep palpation of right ribs. No overlying skin changes. ?- robaxin 1.'5mg'$  TID 3 days. Voltaren genl. Lidocaine patches.Scheduled tylenol. ?

## 2021-09-21 NOTE — Patient Instructions (Addendum)
Dear [name], ? ?Thank you for trusting [name] with your care today. We discussed your back pain.  ? ?For your back pain: ?- I have sent in a prescription for Robaxin, a muscle relaxer.  ?- topical voltaren gel and lidocaine patches will also be beneficial.  ?- you may take up to '1000mg'$  of Tylenol 4 times a day. ? ?You have available refills for your blood pressure and cholesterol medications. ?I refilled your protonix for reflux. ?

## 2021-09-21 NOTE — Telephone Encounter (Signed)
Call from patient c/o of right sided chest pain .Marland Kitchen  States has been doing a lot of lifting.  Has just stated to move around and do things after surgery in August.  No shortness of breath .  Pain when he talks a deep breath .  Has had recent surgery on his neck.  No c/o fevers. Given appointment to come in this am. ?

## 2021-09-23 ENCOUNTER — Telehealth: Payer: Self-pay | Admitting: Student

## 2021-09-23 ENCOUNTER — Telehealth: Payer: Self-pay | Admitting: Internal Medicine

## 2021-09-23 NOTE — Telephone Encounter (Signed)
Spoke to patient about his symptoms,. He states that he is still having pain in hi back and chest when he take a deep breath. He states that the muscle relaxer is not helping. He is concerned about his symptoms and would like to be reevaluated. He does not have any red flag symptoms at this time. Gave strict precautions regarding progression of his symptoms and when to go to the ED.  ? ?Lawerance Cruel, D.O.  ?Internal Medicine Resident, PGY-3 ?[facility]  ?Pager: [phone] (If no response, then contact on call pager) ?4:08 PM, 09/23/2021  ? ?

## 2021-09-23 NOTE — Telephone Encounter (Signed)
Attempted to call this patient back regarding his back pain. I tried his home and cell phone without response. I left a VM to call out clinic back.  ? ? ?Lawerance Cruel, D.O.  ?Internal Medicine Resident, PGY-3 ?[facility]  ?Pager: [phone] (If no response, then contact on call pager) ?2:18 PM, 09/23/2021  ? ? ?

## 2021-09-23 NOTE — Telephone Encounter (Signed)
Pt seen on 09/21/2021 and states when he sneezes he is continuing to have chest pain and still feels the same. ?

## 2021-09-23 NOTE — Telephone Encounter (Signed)
Return pt's call; no answer - left message to call the office. 

## 2021-09-27 NOTE — Progress Notes (Signed)
Internal Medicine Clinic Attending ? ?I saw and evaluated the patient.  I personally confirmed the key portions of the history and exam documented by Dr. Elliot Gurney and I reviewed pertinent patient test results.  The assessment, diagnosis, and plan were formulated together and I agree with the documentation in the resident?s note. Patient reporting increasing right, mid-back pain since increasing physical activity recently. On exam, pain over right thoracic paraspinal muscles on palpation, no significant pain with right flank palpation although patient notes his pain wraps around the side towards the front when it occurs. We discussed conservative treatment with return precautions if not improving or is worsening. Of note, CXR obtained 08/13/21 showing reticulonodular opacities of the right lung base, recommended follow-up in 3-4 weeks for resolution. These imaging findings were noted following the visit, will discuss with resident and contact the patient for f/u. ?

## 2021-09-28 NOTE — Telephone Encounter (Signed)
Please refer to message below.  Attempted to contact patient, but no answer.  Left detailed message asking to call us back to schedule an appointment at his earliest convenience. ?

## 2021-09-29 ENCOUNTER — Encounter: Payer: Medicare Other | Admitting: Internal Medicine

## 2021-09-29 ENCOUNTER — Encounter: Payer: Self-pay | Admitting: Internal Medicine

## 2021-09-29 ENCOUNTER — Other Ambulatory Visit: Payer: Self-pay

## 2021-10-01 ENCOUNTER — Other Ambulatory Visit: Payer: Self-pay | Admitting: Student

## 2021-10-01 DIAGNOSIS — M545 Low back pain, unspecified: Secondary | ICD-10-CM

## 2021-10-01 NOTE — Progress Notes (Signed)
Patient with ongoing right sided chest wall pain. Chest x-ray 01/26 showed reticulonodular opacities at the right lung base, bronchitis vs atypical infection with repeat images in 4-6 weeks. Because he is having ongoing pain will repeat images. Patient did not answer phone call, left voicemail and asked he call clinic back to discuss further. Orders placed for 2 view chest xray and right sided rib xray.  ?

## 2021-10-04 ENCOUNTER — Other Ambulatory Visit: Payer: Self-pay | Admitting: Student

## 2021-10-04 ENCOUNTER — Telehealth: Payer: Self-pay | Admitting: Internal Medicine

## 2021-10-04 NOTE — Telephone Encounter (Signed)
10/04/21: Called patient to inform him of orders for CXR and rib xray to further work up ongoing back/rib pain. Unable to reach patient via telephone. Will continue attempts to contact patient.  ?

## 2021-10-07 ENCOUNTER — Ambulatory Visit (HOSPITAL_COMMUNITY)
Admission: RE | Admit: 2021-10-07 | Discharge: 2021-10-07 | Disposition: A | Discharge status: Home / Self Care | Payer: Medicare Other | Source: Ambulatory Visit | Attending: Internal Medicine | Admitting: Internal Medicine

## 2021-10-07 ENCOUNTER — Other Ambulatory Visit: Payer: Self-pay

## 2021-10-07 DIAGNOSIS — M545 Low back pain, unspecified: Secondary | ICD-10-CM | POA: Insufficient documentation

## 2021-10-07 DIAGNOSIS — R079 Chest pain, unspecified: Secondary | ICD-10-CM | POA: Diagnosis not present

## 2021-10-19 ENCOUNTER — Ambulatory Visit (INDEPENDENT_AMBULATORY_CARE_PROVIDER_SITE_OTHER): Payer: Medicare Other | Admitting: Internal Medicine

## 2021-10-19 ENCOUNTER — Encounter: Payer: Self-pay | Admitting: Internal Medicine

## 2021-10-19 DIAGNOSIS — M545 Low back pain, unspecified: Secondary | ICD-10-CM | POA: Diagnosis not present

## 2021-10-19 DIAGNOSIS — I10 Essential (primary) hypertension: Secondary | ICD-10-CM | POA: Diagnosis not present

## 2021-10-19 MED ORDER — METHOCARBAMOL 500 MG PO TABS: 500.0000 mg | ORAL_TABLET | Freq: Three times a day (TID) | ORAL | 0 refills | Status: AC | PRN

## 2021-10-19 MED ORDER — NAPROXEN 500 MG PO TABS: 500.0000 mg | ORAL_TABLET | Freq: Two times a day (BID) | ORAL | 0 refills | Status: AC

## 2021-10-19 NOTE — Assessment & Plan Note (Signed)
Patient has new low back pain that began yesterday morning after waking up. The patient had difficulty getting out of bed because the pain in the middle of his lower back was so sharp and severe. He has had back pain previously, but it has never been this low or felt sharp. He did not injure himself or experience recent trauma, but he was using a weed wacker on 4/1. He tried to use heat and take Tylenol Arthritis every 8 hours starting yesterday, but these provided minimal relief. Pain does not radiate or shoot down either leg, but patient says it hurts to move and believe his posture is less straight since the pain began. He denied urinary or fecal incontinence, loss of strength, or loss of sensation, making Cauda Equina Syndrome less likely.  ? ?On exam, pain in lower back elicited as patient slowly bent over to touch his toes. There was tenderness in lumbar paraspinal muscles bilaterally, but given lack of concern with presentation and lack of bony tenderness, a fracture is unlikely. He walked with normal gait and was able to flex hip and extend knee against resistance in seated position. Sensation of both lower extremities was equal. Most likely a lumbar muscle strain. He has had muscular back pain previously higher in his spine that was treated with Robaxin and Naproxen. Today, prescribing Naproxen for 7 days and Robaxin to take every 8 hours as needed. Patient has hypertension, but should not have any issues with short course of Naproxen, and he has no documented reason to believe there is increased bleeding risk, decline in kidney function, or heart failure.  ? ?Plan ?- Start Robaxin '500mg'$  A3F PRN tonight ?- Start Naproxen '500mg'$  BID with meals for 7 days ?- Patient given stretches to begin daily ?- Given strict return precautions for changes in bowel or bladder function, sensation changes, worsening pain ?

## 2021-10-19 NOTE — Progress Notes (Signed)
? ?This is a Careers information officer Note.  The care of the patient was discussed with Dr. Harvie Heck, MD and the assessment and plan was formulated with their assistance.  Please see their note for official documentation of the patient encounter.  ? ?Subjective:  ? ?Patient ID: [name] male   DOB: 1956-01-05 66 y.o.   MRN: [id number] ? ?HPI: ?[name] is a 66 y.o. with PMHx of HTN, hyperlipidemia, and anterior cervical decompression fusion surgery in 02/2021 who presents with low back pain. Please see problem based charting for complete assessment and plan.  ? ? ?Past Medical History:  ?Diagnosis Date  ? Arthritis   ? due to age  ? Cocaine abuse ([place])   ? quit  ? ETOH abuse   ? quit  ? Headache   ? Hx of adenomatous polyp of colon   ? Hyperlipidemia   ? Hypertension   ? PONV (postoperative nausea and vomiting)   ? Stroke [facility]   ? posterior limb, R internal capsule stroke 07/2012  ? ?Current Outpatient Medications  ?Medication Sig Dispense Refill  ? methocarbamol (ROBAXIN) 500 MG tablet Take 1 tablet (500 mg total) by mouth every 8 (eight) hours as needed for muscle spasms. 90 tablet 0  ? naproxen (NAPROSYN) 500 MG tablet Take 1 tablet (500 mg total) by mouth 2 (two) times daily with a meal for 7 days. 14 tablet 0  ? acetaminophen (TYLENOL) 500 MG tablet Take 1,000 mg by mouth every 6 (six) hours as needed for moderate pain or headache.    ? amLODipine (NORVASC) 10 MG tablet Take 1 tablet (10 mg total) by mouth daily. 90 tablet 3  ? docusate sodium (COLACE) 50 MG capsule Take 100 mg by mouth daily as needed for mild constipation.    ? Ginseng 250 MG CAPS Take 250 mg by mouth daily.    ? guaiFENesin-codeine (ROBITUSSIN AC) 100-10 MG/5ML syrup Take 5 mLs by mouth every 6 (six) hours as needed (cough). 473 mL 0  ? hydrochlorothiazide (HYDRODIURIL) 25 MG tablet Take 1 tablet (25 mg total) by mouth daily. 90 tablet 3  ? ipratropium (ATROVENT) 0.03 % nasal spray Place 2 sprays into both nostrils every 12 (twelve)  hours. 30 mL 12  ? lidocaine (LIDODERM) 5 % Place 1 patch onto the skin every 12 (twelve) hours. Remove & Discard patch within 12 hours or as directed by MD 10 patch 0  ? Menthol, Topical Analgesic, (BIOFREEZE EX) Apply 1 application topically daily as needed (neck pain).    ? Multiple Vitamins-Minerals (ADULT GUMMY PO) Take 2 capsules by mouth daily.    ? Omega-3 Fatty Acids (FISH OIL) 1000 MG CAPS Take 1,000 mg by mouth daily.    ? OVER THE COUNTER MEDICATION Take 3 tablets by mouth daily as needed (before sex). Nugenix testosterone booster otc supplement    ? pantoprazole (PROTONIX) 40 MG tablet Take 1 tablet (40 mg total) by mouth daily. 30 tablet 0  ? potassium chloride SA (KLOR-CON) 20 MEQ tablet Take 2 tablets (40 mEq total) by mouth 3 (three) times daily for 5 days. 30 tablet 0  ? rosuvastatin (CRESTOR) 20 MG tablet Take 1 tablet (20 mg total) by mouth daily. 90 tablet 3  ? tadalafil (CIALIS) 20 MG tablet Take 1 tablet (20 mg total) by mouth daily as needed for erectile dysfunction. 30 tablet 2  ? ?Current Facility-Administered Medications  ?Medication Dose Route Frequency Provider Last Rate Last Admin  ? 0.9 %  sodium chloride infusion  500 mL Intravenous Once Gatha Mayer, MD      ? ?Family History  ?Problem Relation Age of Onset  ? Pancreatic cancer Father   ? Colon cancer Neg Hx   ? Esophageal cancer Neg Hx   ? Rectal cancer Neg Hx   ? Stomach cancer Neg Hx   ? ?Social History  ? ?Socioeconomic History  ? Marital status: Single  ?  Spouse name: Not on file  ? Number of children: Not on file  ? Years of education: Not on file  ? Highest education level: Not on file  ?Occupational History  ? Not on file  ?Tobacco Use  ? Smoking status: Former  ?  Types: Cigarettes  ?  Quit date: 07/18/2012  ?  Years since quitting: 9.2  ? Smokeless tobacco: Never  ?Vaping Use  ? Vaping Use: Never used  ?Substance and Sexual Activity  ? Alcohol use: No  ?  Alcohol/week: 0.0 standard drinks  ?  Comment: quit 2011  ? Drug  use: No  ?  Comment: h/o cocaine abuse  ? Sexual activity: Not on file  ?Other Topics Concern  ? Not on file  ?Social History Narrative  ? Not on file  ? ?Social Determinants of Health  ? ?Financial Resource Strain: Not on file  ?Food Insecurity: Not on file  ?Transportation Needs: Not on file  ?Physical Activity: Not on file  ?Stress: Not on file  ?Social Connections: Not on file  ? ?Review of Systems: ?Pertinent items are noted in HPI. ?Objective:  ?Physical Exam: ?Vitals:  ? 10/19/21 0910  ?BP: 140/78  ?Pulse: 91  ?Temp: 98.3 ?F (36.8 ?C)  ?TempSrc: Oral  ?SpO2: 95%  ?Weight: 167 lb 1.6 oz (75.8 kg)  ?Height: '5\' 7"'$  (1.702 m)  ? ?BP 140/78 (BP Location: Left Arm, Patient Position: Sitting, Cuff Size: Normal)   Pulse 91   Temp 98.3 ?F (36.8 ?C) (Oral)   Ht '5\' 7"'$  (1.702 m)   Wt 167 lb 1.6 oz (75.8 kg)   SpO2 95%   BMI 26.17 kg/m?  ? ?General Appearance:    Alert, cooperative, no distress  ?Back:     Symmetric, no curvature, ROM normal, some pain in lower back elicited as he slowly bent over to touch his toes, tenderness of paraspinal muscles bilaterally, no bony tenderness  ?Lungs:     Clear to auscultation bilaterally, respirations unlabored  ?Heart:    Regular rate and rhythm, S1 and S2 normal, no murmur, rub   or gallop  ?Abdomen:     Soft, non-tender, bowel sounds active all four quadrants  ?Neurologic:  Normal strength, sensation in bilateral lower extremities. Able to flex hip against resistance in seated position, normal gait with no evidence of lordosis   ? ?Assessment & Plan:  ? ?See encounters tab for problem-based charting. ? ?Low back pain ?Patient has new low back pain that began yesterday morning after waking up. The patient had difficulty getting out of bed because the pain in the middle of his lower back was so sharp and severe. He has had back pain previously, but it has never been this low or felt sharp. He did not injure himself or experience recent trauma, but he was using a weed wacker on  4/1. He tried to use heat and take Tylenol Arthritis every 8 hours starting yesterday, but these provided minimal relief. Pain does not radiate or shoot down either leg, but patient says it hurts to  move and believe his posture is less straight since the pain began. He denied urinary or fecal incontinence, loss of strength, or loss of sensation, making Cauda Equina Syndrome less likely.  ? ?On exam, pain in lower back elicited as patient slowly bent over to touch his toes. There was tenderness in lumbar paraspinal muscles bilaterally, but given lack of concern with presentation and lack of bony tenderness, a fracture is unlikely. He walked with normal gait and was able to flex hip and extend knee against resistance in seated position. Sensation of both lower extremities was equal. Most likely a lumbar muscle strain. He has had muscular back pain previously higher in his spine that was treated with Robaxin and Naproxen. Today, prescribing Naproxen for 7 days and Robaxin to take every 8 hours as needed. Patient has hypertension, but should not have any issues with short course of Naproxen, and he has no documented reason to believe there is increased bleeding risk, decline in kidney function, or heart failure.  ? ?Plan ?- Start Robaxin '500mg'$  q8h PRN tonight ?- Start Naproxen '500mg'$  BID with meals for 7 days ?- Patient given stretches to begin daily ?- Given strict return precautions for changes in bowel or bladder function, sensation changes, worsening pain ? ?Essential hypertension ?Blood pressure slightly elevated today 140/78. Patient did not take his medications this morning and has been in increased pain over past two days. Prescribing Naproxen for back pain, bu given short course, this should not put him at risk or alter his BP significantly. Will continue to monitor at a future visit.   ? ?

## 2021-10-19 NOTE — Patient Instructions (Addendum)
It was nice to see you in clinic today. Here are a few reminders from what we discussed: ? ?We believe you have strained the muscles in your back. We are prescribing Naproxen for pain. Take 1 tablet with meals twice a day for the next 7 days. ?We are also prescribing Robaxin to relax your muscles. Take 1 pill every 8 hours as needed. This pill can make some sleepy or cause balance issues, so we recommend you wait to start taking this until tonight to see how it makes you feel. ?Continue with your daily activities. This will prevent your back from getting stiff. We are including a few stretches for you to try. ? ?If you have any questions or concerns, please do not hesitate to call us. If you start to notice any changes in urination, uncontrolled bowel movements, or weakness in your legs, let us know immediately! ? ?

## 2021-10-19 NOTE — Progress Notes (Signed)
Internal Medicine Clinic Attending ° °Case discussed with Dr. Aslam  At the time of the visit.  We reviewed the resident’s history and exam and pertinent patient test results.  I agree with the assessment, diagnosis, and plan of care documented in the resident’s note.  °

## 2021-10-19 NOTE — Assessment & Plan Note (Signed)
>>  ASSESSMENT AND PLAN FOR LOW BACK PAIN WRITTEN ON 10/19/2021  1:27 PM BY SMITH, JONATHAN R, MEDICAL STUDENT  Patient has new low back pain that began yesterday morning after waking up. The patient had difficulty getting out of bed because the pain in the middle of his lower back was so sharp and severe. He has had back pain previously, but it has never been this low or felt sharp. He did not injure himself or experience recent trauma, but he was using a weed wacker on 4/1. He tried to use heat and take Tylenol  Arthritis every 8 hours starting yesterday, but these provided minimal relief. Pain does not radiate or shoot down either leg, but patient says it hurts to move and believe his posture is less straight since the pain began. He denied urinary or fecal incontinence, loss of strength, or loss of sensation, making Cauda Equina Syndrome less likely.   On exam, pain in lower back elicited as patient slowly bent over to touch his toes. There was tenderness in lumbar paraspinal muscles bilaterally, but given lack of concern with presentation and lack of bony tenderness, a fracture is unlikely. He walked with normal gait and was able to flex hip and extend knee against resistance in seated position. Sensation of both lower extremities was equal. Most likely a lumbar muscle strain. He has had muscular back pain previously higher in his spine that was treated with Robaxin  and Naproxen . Today, prescribing Naproxen  for 7 days and Robaxin  to take every 8 hours as needed. Patient has hypertension, but should not have any issues with short course of Naproxen , and he has no documented reason to believe there is increased bleeding risk, decline in kidney function, or heart failure.   Plan - Start Robaxin  500mg  q8h PRN tonight - Start Naproxen  500mg  BID with meals for 7 days - Patient given stretches to begin daily - Given strict return precautions for changes in bowel or bladder function, sensation changes, worsening  pain

## 2021-10-19 NOTE — Assessment & Plan Note (Signed)
Blood pressure slightly elevated today 140/78. Patient did not take his medications this morning and has been in increased pain over past two days. Prescribing Naproxen for back pain, bu given short course, this should not put him at risk or alter his BP significantly. Will continue to monitor at a future visit.  ?

## 2021-12-27 ENCOUNTER — Encounter: Payer: Medicare Other | Admitting: Internal Medicine

## 2021-12-28 ENCOUNTER — Encounter: Payer: Self-pay | Admitting: Student

## 2021-12-28 ENCOUNTER — Ambulatory Visit (INDEPENDENT_AMBULATORY_CARE_PROVIDER_SITE_OTHER): Payer: Medicare Other | Admitting: Student

## 2021-12-28 DIAGNOSIS — R0789 Other chest pain: Secondary | ICD-10-CM | POA: Diagnosis not present

## 2021-12-28 DIAGNOSIS — I10 Essential (primary) hypertension: Secondary | ICD-10-CM | POA: Diagnosis not present

## 2021-12-28 DIAGNOSIS — Z87891 Personal history of nicotine dependence: Secondary | ICD-10-CM | POA: Diagnosis not present

## 2021-12-28 NOTE — Progress Notes (Signed)
   CC: right-sided chest pain  HPI:  [name] is a 66 y.o. male with history listed below presenting to the [facility] for right-sided chest pain. Please see individualized problem based charting for full HPI.  Past Medical History:  Diagnosis Date   Arthritis    due to age   Cocaine abuse ([place])    quit   ETOH abuse    quit   Headache    Hx of adenomatous polyp of colon    Hyperlipidemia    Hypertension    PONV (postoperative nausea and vomiting)    Stroke [facility])    posterior limb, R internal capsule stroke 07/2012    Review of Systems:  Negative aside from that listed in individualized problem based charting.  Physical Exam:  Vitals:   12/28/21 1429  BP: 129/89  Pulse: 75  Temp: 98 F (36.7 C)  TempSrc: Oral  SpO2: 98%  Weight: 162 lb 8 oz (73.7 kg)  Height: '5\' 7"'$  (1.702 m)   Physical Exam Constitutional:      Appearance: He is well-developed. He is not ill-appearing.  Eyes:     Extraocular Movements: Extraocular movements intact.     Pupils: Pupils are equal, round, and reactive to light.  Cardiovascular:     Rate and Rhythm: Normal rate and regular rhythm.     Heart sounds: Normal heart sounds. No murmur heard.    No friction rub. No gallop.  Pulmonary:     Effort: Pulmonary effort is normal. No respiratory distress.     Breath sounds: Normal breath sounds.  Chest:     Chest wall: No tenderness.  Abdominal:     General: Bowel sounds are normal.     Palpations: Abdomen is soft.     Tenderness: There is no abdominal tenderness.  Musculoskeletal:        General: Normal range of motion.     Cervical back: Normal range of motion.  Skin:    General: Skin is warm and dry.  Neurological:     General: No focal deficit present.     Mental Status: He is alert and oriented to person, place, and time.  Psychiatric:        Mood and Affect: Mood normal.        Behavior: Behavior normal.      Assessment & Plan:   See Encounters Tab for problem based  charting.  Patient discussed with Dr. Heber Holgate

## 2021-12-28 NOTE — Assessment & Plan Note (Signed)
Today's Vitals   12/28/21 1429  BP: 129/89  Pulse: 75  Temp: 98 F (36.7 C)  TempSrc: Oral  SpO2: 98%  Weight: 162 lb 8 oz (73.7 kg)  Height: '5\' 7"'$  (1.702 m)  PainSc: 8    Body mass index is 25.45 kg/m.  Patient's BP at goal with norvasc '10mg'$  daily and HCTZ '25mg'$  daily. Continue current medications.

## 2021-12-28 NOTE — Assessment & Plan Note (Signed)
Patient reports 3-4 day history of right sided chest pain, on the superolateral region of his right chest. Pain does not radiate anywhere and feels more like soreness. Pain is worse with palpation of the affected area. He states that he has tried heating pad and tylenol but only has had mild relief. He works in [employer]ical [employer] and uses some heavy tools to complete his tasks. He denies any nausea, vomiting, left sided chest pain, fevers, chills, radiation of pain, diaphoresis.   During my encounter, patient did not have any pain at this time. On my exam, palpation of his right chest did not elicit tenderness although he was able to point to the general location of where his pain typically is (superolateral region of right chest above nipple). He is not ill-appearing or in any distress at this time.  This appears to be MSK in origin. Advised patient to trial alternating tylenol and ibuprofen prn along with alternating ice and heat. Also advised patient to perform stretching exercises with right shoulder/chest to help ease discomfort.   Plan: -conservative management of MSK chest pain as outlined above -f/u in 2 weeks to assess for resolution, or sooner if worsening or change in quality/location.

## 2021-12-28 NOTE — Patient Instructions (Signed)
Mr. Massman,  It was a pleasure seeing you in the clinic today.   I think your chest pain is muscle-related. Please use tylenol and ibuprofen as needed like we discussed and also use alternating ice and heat to help with the soreness. Please also do the stretching exercises like we discussed. For your hemorrhoids, you can get Miralax over-the-counter to help you have regular bowel movements. Like we discussed, do not sit on the toilet for more than 5-10 minutes. You can also soak in warm baths to help with your symptoms. Please come back in 2 weeks if your chest pain has not gone away, or come back to see [name] sooner if your chest pain worsens.  Please call our clinic at [phone] if you have any questions or concerns. The best time to call is Monday-Friday from 9am-4pm, but there is someone available 24/7 at the same number. If you need medication refills, please notify your pharmacy one week in advance and they will send [name] a request.   Thank you for letting us take part in your care. We look forward to seeing you next time!

## 2021-12-31 NOTE — Progress Notes (Signed)
Internal Medicine Clinic Attending  Case discussed with the resident at the time of the visit.  We reviewed the resident's history and exam and pertinent patient test results.  I agree with the assessment, diagnosis, and plan of care documented in the resident's note.  

## 2022-01-12 ENCOUNTER — Encounter: Payer: Self-pay | Admitting: Student

## 2022-01-12 ENCOUNTER — Ambulatory Visit (INDEPENDENT_AMBULATORY_CARE_PROVIDER_SITE_OTHER): Payer: Medicare Other | Admitting: Student

## 2022-01-12 VITALS — BP 122/88 | HR 75 | Temp 97.5°F | Ht 68.0 in | Wt 160.6 lb

## 2022-01-12 DIAGNOSIS — Z87891 Personal history of nicotine dependence: Secondary | ICD-10-CM | POA: Diagnosis not present

## 2022-01-12 DIAGNOSIS — I10 Essential (primary) hypertension: Secondary | ICD-10-CM

## 2022-01-12 DIAGNOSIS — R7303 Prediabetes: Secondary | ICD-10-CM

## 2022-01-12 DIAGNOSIS — Z0000 Encounter for general adult medical examination without abnormal findings: Secondary | ICD-10-CM

## 2022-01-12 DIAGNOSIS — N529 Male erectile dysfunction, unspecified: Secondary | ICD-10-CM

## 2022-01-12 DIAGNOSIS — K5900 Constipation, unspecified: Secondary | ICD-10-CM

## 2022-01-12 DIAGNOSIS — K219 Gastro-esophageal reflux disease without esophagitis: Secondary | ICD-10-CM

## 2022-01-12 DIAGNOSIS — Z0001 Encounter for general adult medical examination with abnormal findings: Secondary | ICD-10-CM | POA: Diagnosis not present

## 2022-01-12 DIAGNOSIS — Z8673 Personal history of transient ischemic attack (TIA), and cerebral infarction without residual deficits: Secondary | ICD-10-CM

## 2022-01-12 DIAGNOSIS — E876 Hypokalemia: Secondary | ICD-10-CM

## 2022-01-12 DIAGNOSIS — R0789 Other chest pain: Secondary | ICD-10-CM | POA: Diagnosis not present

## 2022-01-12 LAB — POCT GLYCOSYLATED HEMOGLOBIN (HGB A1C): Hemoglobin A1C: 6.2 % — AB (ref 4.0–5.6)

## 2022-01-12 LAB — GLUCOSE, CAPILLARY: Glucose-Capillary: 131 mg/dL — ABNORMAL HIGH (ref 70–99)

## 2022-01-12 MED ORDER — AMLODIPINE BESYLATE 10 MG PO TABS: 10.0000 mg | ORAL_TABLET | Freq: Every day | ORAL | 3 refills | Status: DC

## 2022-01-12 MED ORDER — ROSUVASTATIN CALCIUM 20 MG PO TABS: 20.0000 mg | ORAL_TABLET | Freq: Every day | ORAL | 3 refills | Status: DC

## 2022-01-12 MED ORDER — ZOSTER VAC RECOMB ADJUVANTED 50 MCG/0.5ML IM SUSR: 0.5000 mL | Freq: Once | INTRAMUSCULAR | 0 refills | Status: AC

## 2022-01-12 MED ORDER — DOCUSATE SODIUM 50 MG PO CAPS: 100.0000 mg | ORAL_CAPSULE | Freq: Every day | ORAL | 1 refills | Status: DC | PRN

## 2022-01-12 MED ORDER — TADALAFIL 20 MG PO TABS: 20.0000 mg | ORAL_TABLET | Freq: Every day | ORAL | 2 refills | Status: DC | PRN

## 2022-01-12 MED ORDER — PANTOPRAZOLE SODIUM 40 MG PO TBEC: 40.0000 mg | DELAYED_RELEASE_TABLET | Freq: Every day | ORAL | 0 refills | Status: DC

## 2022-01-12 NOTE — Assessment & Plan Note (Addendum)
Assessment: BP within goal. Denies lightheadedness or dizziness. Continue norvasc and HCTZ  Plan: -Continue norvasc 10 mg daily & HCTZ 25 mg daily  Addendum: K returned back at 3.3. Continues to have hypokalemia on HCTZ. Will discontinue and start Triamterene-HCTZ to negate this.

## 2022-01-12 NOTE — Assessment & Plan Note (Addendum)
Assessment: Repeat A1c today, prior 6.0% a year ago. Discussed continuing active lifestyle and healthy diet. Avoiding sugary drinks.   Plan: - A1c 6.2%, repeat annualy

## 2022-01-12 NOTE — Patient Instructions (Addendum)
Thank you, [name] for allowing [name] to provide your care today. Today we discussed.  Erectile Dysfunction Please continue to take the cialis as needed.   High Blood Pressure Please continue to take your medications. Call the clinic if you find yourself feeling lightheaded or dizzy. Remember to stay hydrated when working outside.   Pre-diabetes We will be checking your A1c which looks at your sugar levels over the last 3 months. Avoid sugary drinks such as sodas and gatorade. If you are going to drink these, please make them diet or no sugar.    Chest Pain Please decrease the amount of exercise to see if the pain resolves, I believe this is muscular pain.   I have ordered the following labs for you:   Lab Orders         BMP8+Anion Gap         POC Hbg A1C       Follow up: 1 year or sooner if need be   Should you have any questions or concerns please call the internal medicine clinic at [phone].    Sanjuana Letters, D.O. [place]

## 2022-01-12 NOTE — Assessment & Plan Note (Signed)
Assessment: Continues to have dull chest pain that is lateral to the right chest wall. When asked to tell me where pain is located, he acknowledges area lateral to chest wall, under pectoralis muscle. No masses appreciate on palpation. Denies nipple changes or discharge. Denies FHx of breast cancer.   Overall appears to still be consistent with chest wall MSK pain. He does yard work frequently and exercises regularly. Recommended taking a break from exercising with work and seeing if he has improvement. Discussed return precautions if pain worsens or changes or becomes associated with shortness of breath or other symptoms.   Plan: -continue conservative management -if no improvement, consider further imaging, [name] vs MRI.

## 2022-01-12 NOTE — Assessment & Plan Note (Signed)
Second shingrix vaccine ordered

## 2022-01-12 NOTE — Progress Notes (Signed)
CC: Annual Visit  HPI:  [name] is a 66 y.o. male living with a history stated below and presents today for wellness visit and management of his chronic medical conditions. Please see problem based assessment and plan for additional details.  Past Medical History:  Diagnosis Date   Arthritis    due to age   Back pain 05/22/2019   Cocaine abuse ([place])    quit   ETOH abuse    quit   Headache    Hx of adenomatous polyp of colon    Hyperlipidemia    Hypertension    PONV (postoperative nausea and vomiting)    Right arm pain 05/29/2019   Stroke [facility])    posterior limb, R internal capsule stroke 07/2012    Current Outpatient Medications on File Prior to Visit  Medication Sig Dispense Refill   acetaminophen (TYLENOL) 500 MG tablet Take 1,000 mg by mouth every 6 (six) hours as needed for moderate pain or headache.     Ginseng 250 MG CAPS Take 250 mg by mouth daily.     guaiFENesin-codeine (ROBITUSSIN AC) 100-10 MG/5ML syrup Take 5 mLs by mouth every 6 (six) hours as needed (cough). 473 mL 0   hydrochlorothiazide (HYDRODIURIL) 25 MG tablet Take 1 tablet (25 mg total) by mouth daily. 90 tablet 3   ipratropium (ATROVENT) 0.03 % nasal spray Place 2 sprays into both nostrils every 12 (twelve) hours. 30 mL 12   lidocaine (LIDODERM) 5 % Place 1 patch onto the skin every 12 (twelve) hours. Remove & Discard patch within 12 hours or as directed by MD 10 patch 0   Menthol, Topical Analgesic, (BIOFREEZE EX) Apply 1 application topically daily as needed (neck pain).     Multiple Vitamins-Minerals (ADULT GUMMY PO) Take 2 capsules by mouth daily.     Omega-3 Fatty Acids (FISH OIL) 1000 MG CAPS Take 1,000 mg by mouth daily.     OVER THE COUNTER MEDICATION Take 3 tablets by mouth daily as needed (before sex). Nugenix testosterone booster otc supplement     potassium chloride SA (KLOR-CON) 20 MEQ tablet Take 2 tablets (40 mEq total) by mouth 3 (three) times daily for 5 days. 30 tablet 0   No  current facility-administered medications on file prior to visit.    Family History  Problem Relation Age of Onset   Pancreatic cancer Father    Colon cancer Neg Hx    Esophageal cancer Neg Hx    Rectal cancer Neg Hx    Stomach cancer Neg Hx     Social History   Socioeconomic History   Marital status: Single    Spouse name: Not on file   Number of children: Not on file   Years of education: Not on file   Highest education level: Not on file  Occupational History   Not on file  Tobacco Use   Smoking status: Former    Types: Cigarettes    Quit date: 07/18/2012    Years since quitting: 9.4   Smokeless tobacco: Never  Vaping Use   Vaping Use: Never used  Substance and Sexual Activity   Alcohol use: No    Alcohol/week: 0.0 standard drinks of alcohol    Comment: quit 2011   Drug use: No    Comment: h/o cocaine abuse   Sexual activity: Not on file  Other Topics Concern   Not on file  Social History Narrative   Not on file   Social Determinants of Health  Financial Resource Strain: Not on file  Food Insecurity: Not on file  Transportation Needs: Not on file  Physical Activity: Not on file  Stress: Not on file  Social Connections: Not on file  Intimate Partner Violence: Not on file    Review of Systems: ROS negative except for what is noted on the assessment and plan.  Vitals:   01/12/22 1333  BP: 122/88  Pulse: 75  Temp: (!) 97.5 F (36.4 C)  TempSrc: Oral  SpO2: 97%  Weight: 160 lb 9.9 oz (72.9 kg)  Height: '5\' 8"'$  (1.727 m)    Physical Exam: Constitutional: well-appearing, no acute distress HENT: normocephalic atraumatic Eyes: conjunctiva non-erythematous Neck: supple Cardiovascular: regular rate and rhythm, no m/r/g Pulmonary/Chest: normal work of breathing on room air MSK: normal bulk and tone Neurological: alert & oriented x 3 Skin: warm and dry Psych: normal mood and thought process  Assessment & Plan:   Essential  hypertension Assessment: BP within goal. Denies lightheadedness or dizziness. Continue norvasc and HCTZ  Plan: -Continue norvasc 10 mg daily & HCTZ 25 mg daily   Erectile dysfunction Assessment: Patient doing well with cialis, with adequate erections. He notes that they only last for 15-20 minutes. Is not interested in trying other ED medicine's. Will continue on cialis for now. Discussed taking medication only once a day as needed   Plan: -continue cialis  Health care maintenance Second shingrix vaccine ordered  Prediabetes Assessment: Repeat A1c today, prior 6.0% a year ago. Discussed continuing active lifestyle and healthy diet. Avoiding sugary drinks.   Plan: - Check A1c today  Musculoskeletal chest pain Assessment: Continues to have dull chest pain that is lateral to the right chest wall. When asked to tell me where pain is located, he acknowledges area lateral to chest wall, under pectoralis muscle. No masses appreciate on palpation. Denies nipple changes or discharge. Denies FHx of breast cancer.   Overall appears to still be consistent with chest wall MSK pain. He does yard work frequently and exercises regularly. Recommended taking a break from exercising with work and seeing if he has improvement. Discussed return precautions if pain worsens or changes or becomes associated with shortness of breath or other symptoms.   Plan: -continue conservative management -if no improvement, consider further imaging, [name] vs MRI.  Hypokalemia Assessment: Will recheck potassium levels today with Hx of hypokalemia and being on diuretic.   Plan: -repeat CMP today  Mediations reviewed with patient and refilled  Patient discussed with Dr. Delano Metz, D.O. [facility], PGY-2 Pager: [phone], Phone: [phone] Date 01/12/2022 Time 2:18 PM

## 2022-01-12 NOTE — Assessment & Plan Note (Addendum)
Assessment: Will recheck potassium levels today with Hx of hypokalemia and being on diuretic.   Plan: -repeat CMP today  K of 3.3. Will DC HCTZ and send in script for triamterene-HCTZ.

## 2022-01-12 NOTE — Assessment & Plan Note (Addendum)
Assessment: Patient doing well with cialis, with adequate erections. He notes that they only last for 15-20 minutes. Is not interested in trying other ED medicine's. Will continue on cialis for now. Discussed taking medication only once a day as needed.  Plan: -continue cialis

## 2022-01-13 LAB — CMP14 + ANION GAP
ALT: 18 IU/L (ref 0–44)
AST: 22 IU/L (ref 0–40)
Albumin/Globulin Ratio: 1.9 (ref 1.2–2.2)
Albumin: 4.7 g/dL (ref 3.8–4.8)
Alkaline Phosphatase: 68 IU/L (ref 44–121)
Anion Gap: 14 mmol/L (ref 10.0–18.0)
BUN/Creatinine Ratio: 15 (ref 10–24)
BUN: 13 mg/dL (ref 8–27)
Bilirubin Total: 0.5 mg/dL (ref 0.0–1.2)
CO2: 25 mmol/L (ref 20–29)
Calcium: 9.7 mg/dL (ref 8.6–10.2)
Chloride: 99 mmol/L (ref 96–106)
Creatinine, Ser: 0.88 mg/dL (ref 0.76–1.27)
Globulin, Total: 2.5 g/dL (ref 1.5–4.5)
Glucose: 126 mg/dL — ABNORMAL HIGH (ref 70–99)
Potassium: 3.3 mmol/L — ABNORMAL LOW (ref 3.5–5.2)
Sodium: 138 mmol/L (ref 134–144)
Total Protein: 7.2 g/dL (ref 6.0–8.5)
eGFR: 94 mL/min/{1.73_m2} (ref >59)

## 2022-01-13 MED ORDER — POTASSIUM CHLORIDE CRYS ER 20 MEQ PO TBCR: 20.0000 meq | EXTENDED_RELEASE_TABLET | Freq: Every day | ORAL | 0 refills | Status: DC

## 2022-01-13 MED ORDER — POTASSIUM CHLORIDE CRYS ER 20 MEQ PO TBCR
40.0000 meq | EXTENDED_RELEASE_TABLET | Freq: Three times a day (TID) | ORAL | 0 refills | Status: DC
Start: 2022-01-13 — End: 2022-01-13

## 2022-01-13 NOTE — Addendum Note (Signed)
Addended by: Riesa Pope on: 01/13/2022 09:35 PM   Modules accepted: Orders

## 2022-01-14 ENCOUNTER — Telehealth: Payer: Self-pay | Admitting: *Deleted

## 2022-01-14 MED ORDER — TRIAMTERENE-HCTZ 37.5-25 MG PO CAPS: 1.0000 | ORAL_CAPSULE | Freq: Every day | ORAL | 1 refills | Status: DC

## 2022-01-14 NOTE — Addendum Note (Signed)
Addended by: Riesa Pope on: 01/14/2022 11:31 AM   Modules accepted: Orders

## 2022-01-14 NOTE — Telephone Encounter (Signed)
Called and spoke with [facility]. Will DC potassium pills and send in prescription for triamterene HCTZ  Thanks

## 2022-01-14 NOTE — Telephone Encounter (Signed)
Threasa Beards would like to know if Rx should be 1 tab daily for 5 days or 2 tabs TID for 5 days.

## 2022-01-14 NOTE — Telephone Encounter (Signed)
Received call from [facility] at [facility] requesting clarification on Rx for potassium. States she received Rx for 30 tabs and one for 5 tabs. Please advise which one should be filled.

## 2022-01-14 NOTE — Telephone Encounter (Signed)
Please end in for 5 days. Thank you

## 2022-01-19 NOTE — Progress Notes (Signed)
Internal Medicine Clinic Attending  Case discussed with the resident at the time of the visit.  We reviewed the resident's history and exam and pertinent patient test results.  I agree with the assessment, diagnosis, and plan of care documented in the resident's note.  

## 2022-01-25 ENCOUNTER — Telehealth: Payer: Self-pay | Admitting: *Deleted

## 2022-01-25 ENCOUNTER — Encounter: Payer: Self-pay | Admitting: Internal Medicine

## 2022-01-25 ENCOUNTER — Other Ambulatory Visit: Payer: Self-pay

## 2022-01-25 ENCOUNTER — Ambulatory Visit (INDEPENDENT_AMBULATORY_CARE_PROVIDER_SITE_OTHER): Payer: Medicare Other | Admitting: Internal Medicine

## 2022-01-25 VITALS — BP 136/82 | HR 52 | Temp 97.6°F | Ht 68.5 in | Wt 161.3 lb

## 2022-01-25 DIAGNOSIS — K921 Melena: Secondary | ICD-10-CM | POA: Diagnosis not present

## 2022-01-25 DIAGNOSIS — K649 Unspecified hemorrhoids: Secondary | ICD-10-CM | POA: Diagnosis not present

## 2022-01-25 DIAGNOSIS — R634 Abnormal weight loss: Secondary | ICD-10-CM

## 2022-01-25 DIAGNOSIS — K641 Second degree hemorrhoids: Secondary | ICD-10-CM | POA: Diagnosis not present

## 2022-01-25 MED ORDER — HYDROCORTISONE 1 % EX CREA: TOPICAL_CREAM | CUTANEOUS | 1 refills | Status: AC

## 2022-01-25 MED ORDER — PHENYLEPHRINE IN HARD FAT 0.25 % RE SUPP: 1.0000 | Freq: Two times a day (BID) | RECTAL | 0 refills | Status: DC

## 2022-01-25 MED ORDER — HYDROCORTISONE 1 % EX CREA: TOPICAL_CREAM | CUTANEOUS | 1 refills | Status: DC

## 2022-01-25 NOTE — Patient Instructions (Signed)
Thank you, [name] for allowing [name] to provide your care today. Today we discussed:  Bleeding: You have internal hemorrhoids and a small external hemorrhoid. Please use phenylephrine suppository daily and hydrocortisone cream on rectum. These will help decrease size of hemorrhoids. I have placed referral to GI. If the suppository and cream help you can cancel GI appointment.    I have ordered the following labs for you:  Lab Orders  No laboratory test(s) ordered today     Referrals ordered today:    Referral Orders         Ambulatory referral to Gastroenterology      I have ordered the following medication/changed the following medications:   Stop the following medications: There are no discontinued medications.   Start the following medications: Meds ordered this encounter  Medications   phenylephrine (,USE FOR PREPARATION-H,) 0.25 % suppository    Sig: Place 1 suppository rectally 2 (two) times daily.    Dispense:  12 suppository    Refill:  0   hydrocortisone cream 1 %    Sig: Apply to affected area 1 times daily    Dispense:  30 g    Refill:  1     Follow up:  1 month    We look forward to seeing you next time. Please call our clinic at [phone] if you have any questions or concerns. The best time to call is Monday-Friday from 9am-4pm, but there is someone available 24/7. If after hours or the weekend, call the main hospital number and ask for the Internal Medicine Resident On-Call. If you need medication refills, please notify your pharmacy one week in advance and they will send [name] a request.   Thank you for trusting me with your care. Wishing you the best!   Christiana Fuchs, [place]

## 2022-01-25 NOTE — Assessment & Plan Note (Addendum)
Mr. Etchison presents due to 1 month history of blood from rectum. He states that he tried using miralax and warm water baths but he continues to have bleeding. He denies pain with bowel movements. He has a small amount of blood on toilet paper after defecating and at times he notices strange feeling while working and has to go to bathroom and finds blood on toilet paper when he wipes. No hematochezia or melena or incontinence. He denies fatigue, dizziness, or light headedness. Endorses itchiness. On exam, small external hemorrhoid on left side of rectum, internal hemorrhoid appreciated that appear to be stage 1 or 2. A/P: Presentation consistent with internal hemorrhoids. I talked with patient about trying phenylephrine suppository and hydrocortisone cream to see if they help decrease size of hemorrhoid. -phenylephrine suppository -hydrocortisone cream BID for 7 days -referral to GI, if symptoms improve with cream and suppository after 6-8 weeks then he will not need to see GI. -f/u in 1 month

## 2022-01-25 NOTE — Telephone Encounter (Signed)
Walk in this am.  C/O rectal bleeding has worsened over last 3 days.  Sees blood on tissue and has to pad bottom when out.  Now has some lower abdominal pain level 3.  No c/o of increased weakness. Bleeding mostly noted after having a bowel movement. States was told to soak his bottom.  Thought to have been hemorrhoids at visit last month.   Given appointment this am for further assessment .

## 2022-01-25 NOTE — Progress Notes (Signed)
Subjective:  CC: rectal bleeding  HPI:  [name] is a 66 y.o. male with a past medical history stated below and presents today for rectal bleeding. Please see problem based assessment and plan for additional details.  Past Medical History:  Diagnosis Date   Arthritis    due to age   Back pain 05/22/2019   Cocaine abuse ([place])    quit   ETOH abuse    quit   Headache    Hx of adenomatous polyp of colon    Hyperlipidemia    Hypertension    PONV (postoperative nausea and vomiting)    Right arm pain 05/29/2019   Stroke [facility])    posterior limb, R internal capsule stroke 07/2012    Current Outpatient Medications on File Prior to Visit  Medication Sig Dispense Refill   acetaminophen (TYLENOL) 500 MG tablet Take 1,000 mg by mouth every 6 (six) hours as needed for moderate pain or headache.     amLODipine (NORVASC) 10 MG tablet Take 1 tablet (10 mg total) by mouth daily. 90 tablet 3   docusate sodium (COLACE) 50 MG capsule Take 2 capsules (100 mg total) by mouth daily as needed for mild constipation. 10 capsule 1   Ginseng 250 MG CAPS Take 250 mg by mouth daily.     guaiFENesin-codeine (ROBITUSSIN AC) 100-10 MG/5ML syrup Take 5 mLs by mouth every 6 (six) hours as needed (cough). 473 mL 0   ipratropium (ATROVENT) 0.03 % nasal spray Place 2 sprays into both nostrils every 12 (twelve) hours. 30 mL 12   lidocaine (LIDODERM) 5 % Place 1 patch onto the skin every 12 (twelve) hours. Remove & Discard patch within 12 hours or as directed by MD 10 patch 0   Menthol, Topical Analgesic, (BIOFREEZE EX) Apply 1 application topically daily as needed (neck pain).     Multiple Vitamins-Minerals (ADULT GUMMY PO) Take 2 capsules by mouth daily.     Omega-3 Fatty Acids (FISH OIL) 1000 MG CAPS Take 1,000 mg by mouth daily.     OVER THE COUNTER MEDICATION Take 3 tablets by mouth daily as needed (before sex). Nugenix testosterone booster otc supplement     pantoprazole (PROTONIX) 40 MG tablet Take  1 tablet (40 mg total) by mouth daily. 30 tablet 0   rosuvastatin (CRESTOR) 20 MG tablet Take 1 tablet (20 mg total) by mouth daily. 90 tablet 3   tadalafil (CIALIS) 20 MG tablet Take 1 tablet (20 mg total) by mouth daily as needed for erectile dysfunction. 30 tablet 2   triamterene-hydrochlorothiazide (DYAZIDE) 37.5-25 MG capsule Take 1 each (1 capsule total) by mouth daily. 90 capsule 1   No current facility-administered medications on file prior to visit.    Family History  Problem Relation Age of Onset   Pancreatic cancer Father    Colon cancer Neg Hx    Esophageal cancer Neg Hx    Rectal cancer Neg Hx    Stomach cancer Neg Hx     Social History   Socioeconomic History   Marital status: Single    Spouse name: Not on file   Number of children: Not on file   Years of education: Not on file   Highest education level: Not on file  Occupational History   Not on file  Tobacco Use   Smoking status: Former    Types: Cigarettes    Quit date: 07/18/2012    Years since quitting: 9.5   Smokeless tobacco: Never  Vaping Use   Vaping Use: Never used  Substance and Sexual Activity   Alcohol use: No    Alcohol/week: 0.0 standard drinks of alcohol    Comment: quit 2011   Drug use: No    Comment: h/o cocaine abuse   Sexual activity: Not on file  Other Topics Concern   Not on file  Social History Narrative   Not on file   Social Determinants of Health   Financial Resource Strain: Not on file  Food Insecurity: Not on file  Transportation Needs: Not on file  Physical Activity: Not on file  Stress: Not on file  Social Connections: Not on file  Intimate Partner Violence: Not on file    Review of Systems: ROS negative except for what is noted on the assessment and plan.  Objective:   Vitals:   01/25/22 1221  BP: 136/82  Pulse: (!) 52  Temp: 97.6 F (36.4 C)  TempSrc: Oral  SpO2: 100%  Weight: 161 lb 4.8 oz (73.2 kg)  Height: 5' 8.5" (1.74 m)    Physical  Exam: Constitutional: well-appearing, in no acute distress Cardiovascular: regular rate and rhythm, no m/r/g Pulmonary/Chest: normal work of breathing on room air, lungs clear to auscultation bilaterally GI: soft, non-tender, non-distended,small external hemorrhoid on left side of rectum, internal hemorrhoid appreciated that appear to be stage 1 or 2. MSK: normal bulk and tone Skin: warm and dry    Assessment & Plan:  Hemorrhoids [name] presents due to 1 month history of blood from rectum. He states that he tried using miralax and warm water baths but he continues to have bleeding. He denies pain with bowel movements. He has a small amount of blood on toilet paper after defecating and at times he notices strange feeling while working and has to go to bathroom and finds blood on toilet paper when he wipes. No hematochezia or melena or incontinence. He denies fatigue, dizziness, or light headedness. Endorses itchiness. On exam, small external hemorrhoid on left side of rectum, internal hemorrhoid appreciated that appear to be stage 1 or 2. A/P: Presentation consistent with internal hemorrhoids. I talked with patient about trying phenylephrine suppository and hydrocortisone cream to see if they help decrease size of hemorrhoid. -phenylephrine suppository -hydrocortisone cream BID for 7 days -referral to GI, if symptoms improve with cream and suppository after 6-8 weeks then he will not need to see GI. -f/u in 1 month  Weight loss Patient presenting with rectal bleeding likely 2/2 to internal hemorrhoids. Prior colonoscopy was completed in 2020 and showed 2 polyps that were removed and scattered diverticula throughout the entire colon. Of note he has lost about 7 lbs since office visit in 3/23. 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 A/P: -trend weight at follow-up in 1 month.   Patient discussed with Dr. Juluis Rainier [name], D.O. [place]  Internal Medicine  PGY-2 Pager: [phone]  Phone: [phone] Date 01/25/2022  Time 9:03 PM ust 2022 after anterior cervical decompression fusion.

## 2022-01-25 NOTE — Assessment & Plan Note (Signed)
Patient presenting with rectal bleeding likely 2/2 to internal hemorrhoids. Prior colonoscopy was completed in 2020 and showed 2 polyps that were removed and scattered diverticula throughout the entire colon. Of note he has lost about 7 lbs since office visit in 3/23. 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 A/P: -trend weight at follow-up in 1 month.

## 2022-02-02 NOTE — Progress Notes (Signed)
Internal Medicine Clinic Attending  Case discussed with the resident at the time of the visit.  We reviewed the resident's history and exam and pertinent patient test results.  I agree with the assessment, diagnosis, and plan of care documented in the resident's note.  

## 2022-02-04 ENCOUNTER — Ambulatory Visit (INDEPENDENT_AMBULATORY_CARE_PROVIDER_SITE_OTHER): Payer: Medicare Other | Admitting: Student

## 2022-02-04 DIAGNOSIS — L03019 Cellulitis of unspecified finger: Secondary | ICD-10-CM | POA: Insufficient documentation

## 2022-02-04 DIAGNOSIS — L03012 Cellulitis of left finger: Secondary | ICD-10-CM

## 2022-02-04 NOTE — Progress Notes (Signed)
CC: pain of left middle index finger  HPI:  [name] is a 66 y.o. male living with a history stated below and presents today for pain of left middle index finger. Please see problem based assessment and plan for additional details.  Past Medical History:  Diagnosis Date   Arthritis    due to age   Back pain 05/22/2019   Cocaine abuse ([place])    quit   ETOH abuse    quit   Headache    Hx of adenomatous polyp of colon    Hyperlipidemia    Hypertension    PONV (postoperative nausea and vomiting)    Right arm pain 05/29/2019   Stroke [facility])    posterior limb, R internal capsule stroke 07/2012    Current Outpatient Medications on File Prior to Visit  Medication Sig Dispense Refill   acetaminophen (TYLENOL) 500 MG tablet Take 1,000 mg by mouth every 6 (six) hours as needed for moderate pain or headache.     amLODipine (NORVASC) 10 MG tablet Take 1 tablet (10 mg total) by mouth daily. 90 tablet 3   docusate sodium (COLACE) 50 MG capsule Take 2 capsules (100 mg total) by mouth daily as needed for mild constipation. 10 capsule 1   Ginseng 250 MG CAPS Take 250 mg by mouth daily.     guaiFENesin-codeine (ROBITUSSIN AC) 100-10 MG/5ML syrup Take 5 mLs by mouth every 6 (six) hours as needed (cough). 473 mL 0   ipratropium (ATROVENT) 0.03 % nasal spray Place 2 sprays into both nostrils every 12 (twelve) hours. 30 mL 12   lidocaine (LIDODERM) 5 % Place 1 patch onto the skin every 12 (twelve) hours. Remove & Discard patch within 12 hours or as directed by MD 10 patch 0   Menthol, Topical Analgesic, (BIOFREEZE EX) Apply 1 application topically daily as needed (neck pain).     Multiple Vitamins-Minerals (ADULT GUMMY PO) Take 2 capsules by mouth daily.     Omega-3 Fatty Acids (FISH OIL) 1000 MG CAPS Take 1,000 mg by mouth daily.     OVER THE COUNTER MEDICATION Take 3 tablets by mouth daily as needed (before sex). Nugenix testosterone booster otc supplement     pantoprazole (PROTONIX) 40 MG  tablet Take 1 tablet (40 mg total) by mouth daily. 30 tablet 0   phenylephrine (,USE FOR PREPARATION-H,) 0.25 % suppository Place 1 suppository rectally 2 (two) times daily. 12 suppository 0   rosuvastatin (CRESTOR) 20 MG tablet Take 1 tablet (20 mg total) by mouth daily. 90 tablet 3   tadalafil (CIALIS) 20 MG tablet Take 1 tablet (20 mg total) by mouth daily as needed for erectile dysfunction. 30 tablet 2   triamterene-hydrochlorothiazide (DYAZIDE) 37.5-25 MG capsule Take 1 each (1 capsule total) by mouth daily. 90 capsule 1   No current facility-administered medications on file prior to visit.    Family History  Problem Relation Age of Onset   Pancreatic cancer Father    Colon cancer Neg Hx    Esophageal cancer Neg Hx    Rectal cancer Neg Hx    Stomach cancer Neg Hx     Social History   Socioeconomic History   Marital status: Single    Spouse name: Not on file   Number of children: Not on file   Years of education: Not on file   Highest education level: Not on file  Occupational History   Not on file  Tobacco Use   Smoking status: Former  Types: Cigarettes    Quit date: 07/18/2012    Years since quitting: 9.5   Smokeless tobacco: Never  Vaping Use   Vaping Use: Never used  Substance and Sexual Activity   Alcohol use: No    Alcohol/week: 0.0 standard drinks of alcohol    Comment: quit 2011   Drug use: No    Comment: h/o cocaine abuse   Sexual activity: Not on file  Other Topics Concern   Not on file  Social History Narrative   Not on file   Social Determinants of Health   Financial Resource Strain: Not on file  Food Insecurity: Not on file  Transportation Needs: Not on file  Physical Activity: Not on file  Stress: Not on file  Social Connections: Not on file  Intimate Partner Violence: Not on file    Review of Systems: ROS negative except for what is noted on the assessment and plan.  Vitals:   02/04/22 1113  BP: 117/77  Pulse: 76  Temp: 98.7 F  (37.1 C)  TempSrc: Oral  SpO2: 99%  Weight: 163 lb (73.9 kg)  Height: '5\' 8"'$  (1.727 m)    Physical Exam: Constitutional: in no acute distress HENT: normocephalic atraumatic, mucous membranes moist Eyes: conjunctiva non-erythematous Neck: supple Pulmonary/Chest: normal work of breathing on room air MSK: normal bulk and tone. Tender to palpation of left lateral nailbed/distal aspect of 3rd digit. No deformity of digit or nail bed. No erythema or fluctuance. Normal ROM of the left hand.  Neurological: alert & oriented x 3 Skin: warm and dry Psych: normal mood  Assessment & Plan:   Paronychia of middle finger Patients presents with pain to the lateral nailbed of his left 3rd digit. The pain started three days ago. He denies injury to the area. Denies having this pain prior. On exam he has full range of motion of all his digits. There is no obvious deformity of the 3rd digit. There is tenderness to palpation at the lateral aspect of the nailbed of the 3rd digit. No erythema or fluctuance presence. DIP and PIP without tenderness.   Overall consistent with paronychia. No drainable abscess appreciated. Recommend warm compresses, warm water soaks, and neosporin ointment. Given strict return precatuions if no improvement by Monday to call the office to be re-evaluated. Also discussed if irritation noted with neosporin to stop using.   Patient discussed with Dr. Butch Penny, D.O. [facility], PGY-3 Phone: [phone] Date 02/04/2022 Time 9:55 PM

## 2022-02-04 NOTE — Patient Instructions (Addendum)
Thank you, Mr.Aundrea A Fultz for allowing [name] to provide your care today. Today we discussed .  Paronychia I believe you have infection/inflammatino of the skin around your nail. Please use warm compresses and warm water soaks. Please apply over the counter neosporin. If you notice the area getting red or irritated from the neosporin, please call our clinic.     I have ordered the following labs for you:  Lab Orders  No laboratory test(s) ordered today     Referrals ordered today:   Referral Orders  No referral(s) requested today     I have ordered the following medication/changed the following medications:   Stop the following medications: There are no discontinued medications.   Start the following medications: No orders of the defined types were placed in this encounter.    Follow up:  1 week if symptoms do not get better     Should you have any questions or concerns please call the internal medicine clinic at [phone].    Sanjuana Letters, D.O. [place]

## 2022-02-04 NOTE — Assessment & Plan Note (Signed)
Patients presents with pain to the lateral nailbed of his left 3rd digit. The pain started three days ago. He denies injury to the area. Denies having this pain prior. On exam he has full range of motion of all his digits. There is no obvious deformity of the 3rd digit. There is tenderness to palpation at the lateral aspect of the nailbed of the 3rd digit. No erythema or fluctuance presence. DIP and PIP without tenderness.   Overall consistent with paronychia. No drainable abscess appreciated. Recommend warm compresses, warm water soaks, and neosporin ointment. Given strict return precatuions if no improvement by Monday to call the office to be re-evaluated. Also discussed if irritation noted with neosporin to stop using.

## 2022-02-09 NOTE — Progress Notes (Signed)
Internal Medicine Clinic Attending  Case discussed with Dr. Katsadouros  At the time of the visit.  We reviewed the resident's history and exam and pertinent patient test results.  I agree with the assessment, diagnosis, and plan of care documented in the resident's note.  

## 2022-02-12 ENCOUNTER — Other Ambulatory Visit: Payer: Self-pay | Admitting: Internal Medicine

## 2022-02-12 DIAGNOSIS — K219 Gastro-esophageal reflux disease without esophagitis: Secondary | ICD-10-CM

## 2022-02-15 ENCOUNTER — Encounter: Payer: Medicare Other | Admitting: Student

## 2022-02-15 ENCOUNTER — Ambulatory Visit: Payer: Medicare Other

## 2022-04-01 DIAGNOSIS — M542 Cervicalgia: Secondary | ICD-10-CM | POA: Diagnosis not present

## 2022-04-13 ENCOUNTER — Ambulatory Visit: Payer: Self-pay | Admitting: *Deleted

## 2022-04-13 ENCOUNTER — Encounter: Payer: Self-pay | Admitting: Emergency Medicine

## 2022-04-13 ENCOUNTER — Ambulatory Visit
Admission: EM | Admit: 2022-04-13 | Discharge: 2022-04-13 | Disposition: A | Discharge status: Home / Self Care | Payer: Medicare Other | Attending: Physician Assistant | Admitting: Physician Assistant

## 2022-04-13 DIAGNOSIS — M25552 Pain in left hip: Secondary | ICD-10-CM | POA: Diagnosis not present

## 2022-04-13 DIAGNOSIS — M7062 Trochanteric bursitis, left hip: Secondary | ICD-10-CM

## 2022-04-13 MED ORDER — PREDNISONE 10 MG PO TABS: 10.0000 mg | ORAL_TABLET | Freq: Three times a day (TID) | ORAL | 0 refills | Status: DC

## 2022-04-13 MED ORDER — IBUPROFEN 600 MG PO TABS: 600.0000 mg | ORAL_TABLET | Freq: Three times a day (TID) | ORAL | 0 refills | Status: DC

## 2022-04-13 NOTE — Telephone Encounter (Signed)
  Chief Complaint: left hip pain severe Symptoms: left hip pain shoots down to knee. Reports he has been taking a lot of tylenol, suspect more than allotted amount in 24 hours for pain with no relief. Unable to sleep , limping.  Frequency: couple of weeks  Pertinent Negatives: Patient denies fever, no rash no back pain  Disposition: '[]'$ ED /'[x]'$ Urgent Care (no appt availability in office) / '[]'$ Appointment(In office/virtual)/ '[]'$  [facility]/ '[]'$ Home Care/ '[]'$ Refused Recommended Disposition /'[]'$ [facility]/ '[]'$  Follow-up with PCP Additional Notes:   Requesting [facility]. Recommended mobile bus but to far away today. Will go to [facility].    Reason for Disposition  [1] SEVERE pain (e.g., excruciating, unable to do any normal activities) AND [2] not improved after 2 hours of pain medicine  Answer Assessment - Initial Assessment Questions 1. LOCATION and RADIATION: "Where is the pain located?"      Left hip pain 2. QUALITY: "What does the pain feel like?"  (e.g., sharp, dull, aching, burning)     Pain  3. SEVERITY: "How bad is the pain?" "What does it keep you from doing?"   (Scale 1-10; or mild, moderate, severe)   -  MILD (1-3): doesn't interfere with normal activities    -  MODERATE (4-7): interferes with normal activities (e.g., work or school) or awakens from sleep, limping    -  SEVERE (8-10): excruciating pain, unable to do any normal activities, unable to walk     Moderate limp, not sleeping  4. ONSET: "When did the pain start?" "Does it come and go, or is it there all the time?"     Comes and goes and strong  5. WORK OR EXERCISE: "Has there been any recent work or exercise that involved this part of the body?"      na 6. CAUSE: "What do you think is causing the hip pain?"      Not sure  7. AGGRAVATING FACTORS: "What makes the hip pain worse?" (e.g., walking, climbing stairs, running)     Pain worse with walking and sitting  8. OTHER SYMPTOMS: "Do you have any other  symptoms?" (e.g., back pain, pain shooting down leg,  fever, rash)     No back pain pain shooting down to knee  Protocols used: Hip Pain-A-AH

## 2022-04-13 NOTE — ED Triage Notes (Signed)
Pt is present today with c/oi left hip pain radiating down his left knee. Pt sx started x2 weeks ago. Pt denies any injury

## 2022-04-13 NOTE — Discharge Instructions (Addendum)
Advised using ice therapy, 10 minutes on 20 minutes off, 3-4 times throughout the evening to help reduce pain and irritation. Advised take ibuprofen 600 mg every 8 hours with food on a regular basis to help reduce the pain. Advised take prednisone 10 mg 3 times a day for the next 5 days to help reduce acute inflammation and irritation. Advised follow-up PCP or return to urgent care if symptoms fail to improve.

## 2022-04-13 NOTE — ED Provider Notes (Signed)
[facility]    CSN: [id number] Arrival date & time: 04/13/22  1020      History   Chief Complaint Chief Complaint  Patient presents with   Hip Pain    HPI [name] is a 66 y.o. male.   27-year-old male presents with left hip pain.  She indicates that couple weeks ago he was spraying fertilizer with a walk behind spreader and he did several yards.  He indicates that after he started having some left hip pain and tenderness.  He indicates that it is worse when he gets up and walks.  He indicates the pain is down the outside of the left hip and the pain travels down to his left knee.  He relates he is not having any back pain, weakness, numbness or tingling of the left leg.  He indicates he has been taking some Tylenol and using some creams but these have not given him much relief from his symptoms.   Hip Pain    Past Medical History:  Diagnosis Date   Arthritis    due to age   Back pain 05/22/2019   Cocaine abuse ([place])    quit   ETOH abuse    quit   Headache    Hx of adenomatous polyp of colon    Hyperlipidemia    Hypertension    PONV (postoperative nausea and vomiting)    Right arm pain 05/29/2019   Stroke ([place])    posterior limb, R internal capsule stroke 07/2012    Patient Active Problem List   Diagnosis Date Noted   Paronychia of middle finger 02/04/2022   Hemorrhoids 01/25/2022   Weight loss 01/25/2022   Musculoskeletal chest pain 12/28/2021   Low back pain 10/19/2021   Routine screening for STI (sexually transmitted infection) 08/13/2021   Cough 08/06/2021   Upper respiratory symptom 08/04/2021   Hypokalemia 02/17/2021   Left groin pain 01/25/2021   Acute right-sided low back pain 12/03/2020   Prediabetes 02/27/2020   Neck pain on right side 02/27/2020   Upper respiratory infection, viral 08/08/2019   Hx of adenomatous polyp of colon    Epidermoid cyst 06/25/2018   Pruritus 06/25/2018   History of colonic polyps 07/19/2016   Lumbar  degenerative disc disease 08/05/2015   Erectile dysfunction 04/22/2015   Dyslipidemia 04/22/2015   Subconjunctival hematoma, left 05/28/2014   Health care maintenance 05/14/2014   GERD (gastroesophageal reflux disease) 09/19/2012   History of CVA (cerebrovascular accident) 08/02/2012   Essential hypertension 05/31/2007    Past Surgical History:  Procedure Laterality Date   ANTERIOR CERVICAL DECOMPRESSION/DISCECTOMY FUSION 4 LEVELS N/A 02/18/2021   Procedure: ANTERIOR CERVICAL DECOMPRESSION FUSION CERVIAL 4- CERVICAL 5, CERVICAL 5- CERVICAL 6, CERVICAL 6- CERVICAL 7 WITH INSTRUMENTATION AND ALLOGRAFT;  Surgeon: Phylliss Bob, MD;  Location: [place];  Service: Orthopedics;  Laterality: N/A;   bone spur repair Left    COLONOSCOPY     HAND SURGERY     left hand       Home Medications    Prior to Admission medications   Medication Sig Start Date End Date Taking? Authorizing Provider  ibuprofen (ADVIL) 600 MG tablet Take 1 tablet (600 mg total) by mouth 3 (three) times daily. 04/13/22  Yes Nyoka Lint, PA-C  predniSONE (DELTASONE) 10 MG tablet Take 1 tablet (10 mg total) by mouth in the morning, at noon, and at bedtime. 04/13/22  Yes Nyoka Lint, PA-C  acetaminophen (TYLENOL) 500 MG tablet Take 1,000 mg by  mouth every 6 (six) hours as needed for moderate pain or headache.    [provider]  amLODipine (NORVASC) 10 MG tablet Take 1 tablet (10 mg total) by mouth daily. 01/12/22   Riesa Pope, MD  docusate sodium (COLACE) 50 MG capsule Take 2 capsules (100 mg total) by mouth daily as needed for mild constipation. 01/12/22   Katsadouros, Vasilios, MD  Ginseng 250 MG CAPS Take 250 mg by mouth daily.    [provider]  guaiFENesin-codeine (ROBITUSSIN AC) 100-10 MG/5ML syrup Take 5 mLs by mouth every 6 (six) hours as needed (cough). 08/10/21   Maudie Mercury, MD  ipratropium (ATROVENT) 0.03 % nasal spray Place 2 sprays into both nostrils every 12 (twelve) hours. 08/10/21    Maudie Mercury, MD  lidocaine (LIDODERM) 5 % Place 1 patch onto the skin every 12 (twelve) hours. Remove & Discard patch within 12 hours or as directed by MD 09/21/21 09/21/22  Delene Ruffini, MD  Menthol, Topical Analgesic, (BIOFREEZE EX) Apply 1 application topically daily as needed (neck pain).    [provider]  Multiple Vitamins-Minerals (ADULT GUMMY PO) Take 2 capsules by mouth daily.    [provider]  Omega-3 Fatty Acids (FISH OIL) 1000 MG CAPS Take 1,000 mg by mouth daily.    [provider]  OVER THE COUNTER MEDICATION Take 3 tablets by mouth daily as needed (before sex). Nugenix testosterone booster otc supplement    [provider]  pantoprazole (PROTONIX) 40 MG tablet Take 1 tablet by mouth once daily 02/14/22   Katsadouros, Vasilios, MD  phenylephrine (,USE FOR PREPARATION-H,) 0.25 % suppository Place 1 suppository rectally 2 (two) times daily. 01/25/22   Masters, Katie, DO  rosuvastatin (CRESTOR) 20 MG tablet Take 1 tablet (20 mg total) by mouth daily. 01/12/22   Katsadouros, Vasilios, MD  tadalafil (CIALIS) 20 MG tablet Take 1 tablet (20 mg total) by mouth daily as needed for erectile dysfunction. 01/12/22 04/12/22  Riesa Pope, MD  triamterene-hydrochlorothiazide (DYAZIDE) 37.5-25 MG capsule Take 1 each (1 capsule total) by mouth daily. 01/14/22 01/14/23  Riesa Pope, MD    Family History Family History  Problem Relation Age of Onset   Pancreatic cancer Father    Colon cancer Neg Hx    Esophageal cancer Neg Hx    Rectal cancer Neg Hx    Stomach cancer Neg Hx     Social History Social History   Tobacco Use   Smoking status: Former    Types: Cigarettes    Quit date: 07/18/2012    Years since quitting: 9.7   Smokeless tobacco: Never  Vaping Use   Vaping Use: Never used  Substance Use Topics   Alcohol use: No    Alcohol/week: 0.0 standard drinks of alcohol    Comment: quit 2011   Drug use: No    Comment: h/o cocaine  abuse     Allergies   Ace inhibitors and Penicillins   Review of Systems Review of Systems  Musculoskeletal:  Positive for gait problem (left hip pain).     Physical Exam Triage Vital Signs ED Triage Vitals [04/13/22 1044]  Enc Vitals Group     BP 136/87     Pulse Rate 79     Resp 18     Temp 97.6 F (36.4 C)     Temp src      SpO2 95 %     Weight      Height      Head Circumference  Peak Flow      Pain Score 8     Pain Loc      Pain Edu?      Excl. in [place]?    No data found.  Updated Vital Signs BP 136/87   Pulse 79   Temp 97.6 F (36.4 C)   Resp 18   SpO2 95%   Visual Acuity Right Eye Distance:   Left Eye Distance:   Bilateral Distance:    Right Eye Near:   Left Eye Near:    Bilateral Near:     Physical Exam Constitutional:      Appearance: Normal appearance.  Musculoskeletal:       Legs:     Comments: Left hip: Pain is palpated along the posterior trochanteric area, pain with internal and external rotation.  Range of motion is normal without crepitus or restriction.  Strength is intact.  No unusual redness or swelling of the area.  Neurological:     Mental Status: He is alert.      UC Treatments / Results  Labs (all labs ordered are listed, but only abnormal results are displayed) Labs Reviewed - No data to display  EKG   Radiology No results found.  Procedures Procedures (including critical care time)  Medications Ordered in UC Medications - No data to display  Initial Impression / Assessment and Plan / UC Course  I have reviewed the triage vital signs and the nursing notes.  Pertinent labs & imaging results that were available during my care of the patient were reviewed by me and considered in my medical decision making (see chart for details).       Plan: 1.  Trochanteric bursitis will be treated with ibuprofen 600 mg every 8 hours with food. 2.  The trochanteric bursitis will be treated with prednisone 10 mg 3  times a day for 5 days only to reduce the inflammation and irritation. 3.  The patient is advised to use ice therapy, 10 minutes on 20 minutes off, 3-4 times throughout the evening to help reduce the pain and irritation from the bursitis. 4.  Patient is advised to follow-up PCP or return to urgent care if symptoms fail to improve. Final Clinical Impressions(s) / UC Diagnoses   Final diagnoses:  Trochanteric bursitis of left hip  Left hip pain     Discharge Instructions      Advised using ice therapy, 10 minutes on 20 minutes off, 3-4 times throughout the evening to help reduce pain and irritation. Advised take ibuprofen 600 mg every 8 hours with food on a regular basis to help reduce the pain. Advised take prednisone 10 mg 3 times a day for the next 5 days to help reduce acute inflammation and irritation. Advised follow-up PCP or return to urgent care if symptoms fail to improve.    ED Prescriptions     Medication Sig Dispense Auth. Provider   ibuprofen (ADVIL) 600 MG tablet Take 1 tablet (600 mg total) by mouth 3 (three) times daily. 30 tablet Nyoka Lint, PA-C   predniSONE (DELTASONE) 10 MG tablet Take 1 tablet (10 mg total) by mouth in the morning, at noon, and at bedtime. 15 tablet Nyoka Lint, PA-C      PDMP not reviewed this encounter.   Nyoka Lint, PA-C 04/13/22 1058

## 2022-04-19 ENCOUNTER — Encounter: Payer: Medicare Other | Admitting: Internal Medicine

## 2022-05-04 ENCOUNTER — Ambulatory Visit
Admission: EM | Admit: 2022-05-04 | Discharge: 2022-05-04 | Disposition: A | Discharge status: Home / Self Care | Payer: Medicare Other | Attending: Physician Assistant | Admitting: Physician Assistant

## 2022-05-04 DIAGNOSIS — M25552 Pain in left hip: Secondary | ICD-10-CM | POA: Diagnosis not present

## 2022-05-04 DIAGNOSIS — M25562 Pain in left knee: Secondary | ICD-10-CM | POA: Diagnosis not present

## 2022-05-04 MED ORDER — KETOROLAC TROMETHAMINE 30 MG/ML IJ SOLN
30.0000 mg | Freq: Once | INTRAMUSCULAR | Status: AC
  Administered 2022-05-04: 30 mg via INTRAMUSCULAR

## 2022-05-04 NOTE — ED Provider Notes (Signed)
[facility]    CSN: [id number] Arrival date & time: 05/04/22  5170      History   Chief Complaint Chief Complaint  Patient presents with   Leg Pain   Hip Pain    HPI [name] is a 66 y.o. male.   Patient here today for evaluation of recurrent left hip pain that radiates down his leg into his knee.  He reports that pain has been ongoing for over a month.  He was seen and treated for same several weeks ago and states he did have some improvement with symptoms however no resolution.  He denies any numbness or tingling.  He does not report treatment for pain at home.  He does not report any known injury.  The history is provided by the patient.  Leg Pain Associated symptoms: no fever   Hip Pain Pertinent negatives include no shortness of breath.    Past Medical History:  Diagnosis Date   Arthritis    due to age   Back pain 05/22/2019   Cocaine abuse ([place])    quit   ETOH abuse    quit   Headache    Hx of adenomatous polyp of colon    Hyperlipidemia    Hypertension    PONV (postoperative nausea and vomiting)    Right arm pain 05/29/2019   Stroke ([place])    posterior limb, R internal capsule stroke 07/2012    Patient Active Problem List   Diagnosis Date Noted   Paronychia of middle finger 02/04/2022   Hemorrhoids 01/25/2022   Weight loss 01/25/2022   Musculoskeletal chest pain 12/28/2021   Low back pain 10/19/2021   Routine screening for STI (sexually transmitted infection) 08/13/2021   Cough 08/06/2021   Upper respiratory symptom 08/04/2021   Hypokalemia 02/17/2021   Left groin pain 01/25/2021   Acute right-sided low back pain 12/03/2020   Prediabetes 02/27/2020   Neck pain on right side 02/27/2020   Upper respiratory infection, viral 08/08/2019   Hx of adenomatous polyp of colon    Epidermoid cyst 06/25/2018   Pruritus 06/25/2018   History of colonic polyps 07/19/2016   Lumbar degenerative disc disease 08/05/2015   Erectile dysfunction  04/22/2015   Dyslipidemia 04/22/2015   Subconjunctival hematoma, left 05/28/2014   Health care maintenance 05/14/2014   GERD (gastroesophageal reflux disease) 09/19/2012   History of CVA (cerebrovascular accident) 08/02/2012   Essential hypertension 05/31/2007    Past Surgical History:  Procedure Laterality Date   ANTERIOR CERVICAL DECOMPRESSION/DISCECTOMY FUSION 4 LEVELS N/A 02/18/2021   Procedure: ANTERIOR CERVICAL DECOMPRESSION FUSION CERVIAL 4- CERVICAL 5, CERVICAL 5- CERVICAL 6, CERVICAL 6- CERVICAL 7 WITH INSTRUMENTATION AND ALLOGRAFT;  Surgeon: Phylliss Bob, MD;  Location: [facility];  Service: Orthopedics;  Laterality: N/A;   bone spur repair Left    COLONOSCOPY     HAND SURGERY     left hand       Home Medications    Prior to Admission medications   Medication Sig Start Date End Date Taking? Authorizing Provider  acetaminophen (TYLENOL) 500 MG tablet Take 1,000 mg by mouth every 6 (six) hours as needed for moderate pain or headache.    [provider]  amLODipine (NORVASC) 10 MG tablet Take 1 tablet (10 mg total) by mouth daily. 01/12/22   Riesa Pope, MD  docusate sodium (COLACE) 50 MG capsule Take 2 capsules (100 mg total) by mouth daily as needed for mild constipation. 01/12/22   Riesa Pope, MD  Ginseng 250 MG CAPS Take 250 mg by mouth daily.    [provider]  guaiFENesin-codeine (ROBITUSSIN AC) 100-10 MG/5ML syrup Take 5 mLs by mouth every 6 (six) hours as needed (cough). 08/10/21   Maudie Mercury, MD  ibuprofen (ADVIL) 600 MG tablet Take 1 tablet (600 mg total) by mouth 3 (three) times daily. 04/13/22   Nyoka Lint, PA-C  ipratropium (ATROVENT) 0.03 % nasal spray Place 2 sprays into both nostrils every 12 (twelve) hours. 08/10/21   Maudie Mercury, MD  lidocaine (LIDODERM) 5 % Place 1 patch onto the skin every 12 (twelve) hours. Remove & Discard patch within 12 hours or as directed by MD 09/21/21 09/21/22  Delene Ruffini, MD  Menthol,  Topical Analgesic, (BIOFREEZE EX) Apply 1 application topically daily as needed (neck pain).    [provider]  Multiple Vitamins-Minerals (ADULT GUMMY PO) Take 2 capsules by mouth daily.    [provider]  Omega-3 Fatty Acids (FISH OIL) 1000 MG CAPS Take 1,000 mg by mouth daily.    [provider]  OVER THE COUNTER MEDICATION Take 3 tablets by mouth daily as needed (before sex). Nugenix testosterone booster otc supplement    [provider]  pantoprazole (PROTONIX) 40 MG tablet Take 1 tablet by mouth once daily 02/14/22   Katsadouros, Vasilios, MD  phenylephrine (,USE FOR PREPARATION-H,) 0.25 % suppository Place 1 suppository rectally 2 (two) times daily. 01/25/22   Masters, Joellen Jersey, DO  predniSONE (DELTASONE) 10 MG tablet Take 1 tablet (10 mg total) by mouth in the morning, at noon, and at bedtime. 04/13/22   Nyoka Lint, PA-C  rosuvastatin (CRESTOR) 20 MG tablet Take 1 tablet (20 mg total) by mouth daily. 01/12/22   Katsadouros, Vasilios, MD  tadalafil (CIALIS) 20 MG tablet Take 1 tablet (20 mg total) by mouth daily as needed for erectile dysfunction. 01/12/22 04/12/22  Riesa Pope, MD  triamterene-hydrochlorothiazide (DYAZIDE) 37.5-25 MG capsule Take 1 each (1 capsule total) by mouth daily. 01/14/22 01/14/23  Riesa Pope, MD    Family History Family History  Problem Relation Age of Onset   Pancreatic cancer Father    Colon cancer Neg Hx    Esophageal cancer Neg Hx    Rectal cancer Neg Hx    Stomach cancer Neg Hx     Social History Social History   Tobacco Use   Smoking status: Former    Types: Cigarettes    Quit date: 07/18/2012    Years since quitting: 9.8   Smokeless tobacco: Never  Vaping Use   Vaping Use: Never used  Substance Use Topics   Alcohol use: No    Alcohol/week: 0.0 standard drinks of alcohol    Comment: quit 2011   Drug use: No    Comment: h/o cocaine abuse     Allergies   Ace inhibitors and  Penicillins   Review of Systems Review of Systems  Constitutional:  Negative for chills and fever.  Eyes:  Negative for discharge and redness.  Respiratory:  Negative for shortness of breath.   Musculoskeletal:  Negative for arthralgias and myalgias.  Neurological:  Negative for numbness.     Physical Exam Triage Vital Signs ED Triage Vitals  Enc Vitals Group     BP 05/04/22 1022 120/79     Pulse Rate 05/04/22 1022 72     Resp 05/04/22 1022 17     Temp 05/04/22 1022 97.7 F (36.5 C)     Temp Source 05/04/22 1022 Oral  SpO2 05/04/22 1022 95 %     Weight --      Height --      Head Circumference --      Peak Flow --      Pain Score 05/04/22 1021 7     Pain Loc --      Pain Edu? --      Excl. in [place]? --    No data found.  Updated Vital Signs BP 120/79 (BP Location: Left Arm)   Pulse 72   Temp 97.7 F (36.5 C) (Oral)   Resp 17   SpO2 95%      Physical Exam Vitals and nursing note reviewed.  Constitutional:      General: He is not in acute distress.    Appearance: Normal appearance. He is not ill-appearing.  HENT:     Head: Normocephalic and atraumatic.  Eyes:     Conjunctiva/sclera: Conjunctivae normal.  Cardiovascular:     Rate and Rhythm: Normal rate.  Pulmonary:     Effort: Pulmonary effort is normal.  Neurological:     Mental Status: He is alert.  Psychiatric:        Mood and Affect: Mood normal.        Behavior: Behavior normal.        Thought Content: Thought content normal.      UC Treatments / Results  Labs (all labs ordered are listed, but only abnormal results are displayed) Labs Reviewed - No data to display  EKG   Radiology No results found.  Procedures Procedures (including critical care time)  Medications Ordered in UC Medications  ketorolac (TORADOL) 30 MG/ML injection 30 mg (30 mg Intramuscular Given 05/04/22 1038)    Initial Impression / Assessment and Plan / UC Course  I have reviewed the triage vital signs and the  nursing notes.  Pertinent labs & imaging results that were available during my care of the patient were reviewed by me and considered in my medical decision making (see chart for details).    Given ongoing problem recommended further evaluation with Ortho.  Will defer imaging to specialist.  Toradol injection administered in office today for hopeful pain relief, will avoid steroids at this time given recent steroid use.  Encouraged follow-up with any further concerns.  Final Clinical Impressions(s) / UC Diagnoses   Final diagnoses:  Left hip pain  Acute pain of left knee     Discharge Instructions       Please follow up with ortho for further evaluation.     ED Prescriptions   None    PDMP not reviewed this encounter.   Francene Finders, PA-C 05/04/22 1142

## 2022-05-04 NOTE — Discharge Instructions (Signed)
  Please follow up with ortho for further evaluation.

## 2022-05-04 NOTE — ED Triage Notes (Signed)
Pt presents with recurrent left hip pain that radiates down leg and knee fo rover a month.

## 2022-05-12 ENCOUNTER — Ambulatory Visit (INDEPENDENT_AMBULATORY_CARE_PROVIDER_SITE_OTHER): Payer: Medicare Other

## 2022-05-12 ENCOUNTER — Encounter: Payer: Self-pay | Admitting: Student

## 2022-05-12 ENCOUNTER — Ambulatory Visit (INDEPENDENT_AMBULATORY_CARE_PROVIDER_SITE_OTHER): Payer: Medicare Other | Admitting: Student

## 2022-05-12 VITALS — BP 158/102 | HR 70 | Temp 97.8°F | Ht 68.0 in | Wt 158.6 lb

## 2022-05-12 DIAGNOSIS — M5432 Sciatica, left side: Secondary | ICD-10-CM | POA: Insufficient documentation

## 2022-05-12 DIAGNOSIS — Z0000 Encounter for general adult medical examination without abnormal findings: Secondary | ICD-10-CM | POA: Diagnosis not present

## 2022-05-12 DIAGNOSIS — M25552 Pain in left hip: Secondary | ICD-10-CM | POA: Diagnosis not present

## 2022-05-12 MED ORDER — NAPROXEN 500 MG PO TABS: 500.0000 mg | ORAL_TABLET | Freq: Two times a day (BID) | ORAL | 0 refills | Status: AC

## 2022-05-12 NOTE — Assessment & Plan Note (Signed)
Patient presents with a 65-monthhistory of left hip pain.  He states that this pain is aching/throbbing in nature.  He states that he has had 2 urgent care visits for this.  Initially he was given ibuprofen and prednisone for trochanteric bursitis on 04/13/2022.  This initially helped, and then the pain came back.  He states he went back to urgent care on 05/04/2022.  In which at that visit he was told he may have arthritis and had a shot of Toradol given to him.  He presents today with the same pain.  He describes it as a throbbing/aching pain that gets better with standing, and worse with sitting.  He states that it worsens with walking as well.  Sometimes it is burning, and gets worse at night.  On exam, he has full range of motion of bilateral hips and knees in all ranges of motion.  He has 5/5 strength to bilateral lower extremities on hip flexion, hip extension, internal rotation, external rotation, and knee extension.  There is no tenderness at the greater trochanter.  No obvious deformities noted.  I am leaning either towards meralgia paresthetica versus IT band syndrome.  Plan: -Plan to refer to physical therapy -Short course of naproxen 500 mg twice daily with meals -Hip exercises provided for patient -Patient instructed to follow back up if does not resolve, as we may have to pursue imaging if this does not resolve

## 2022-05-12 NOTE — Patient Instructions (Addendum)
Savva, Beamer you for allowing me to take part in your care today.  Here are your instructions.  1.  Regarding your hip pain, I do not think this is arthritis, I think this is more of a muscular issue.  The 2 things that I am going between his IT band syndrome versus meralgia paresthetica.  Both ways, I will treat the same.  I have written for naproxen for you to take 2 times a day with meals.  Please take as prescribed.  I have also referred you to physical therapy.  Please await phone call for physical therapy scheduling.  2.  Please continue to ice the area for comfort.  Please return for follow-up if this does not self resolve.  3.  Your blood pressure was elevated today, please follow-up in 1 month for blood pressure check.  4.  I have written for some hip exercises, please try these at home.  Thank you, Dr. Posey Pronto  If you have any other questions please contact the internal medicine clinic at [phone]

## 2022-05-12 NOTE — Progress Notes (Signed)
CC: Left hip pain  HPI:  [name] is a 66 y.o. male with a past medical history of hypertension presenting with concerns of left hip pain.  Please see assessment and plan for full HPI  Past Medical History:  Diagnosis Date   Arthritis    due to age   Back pain 05/22/2019   Cocaine abuse ([place])    quit   ETOH abuse    quit   Headache    Hx of adenomatous polyp of colon    Hyperlipidemia    Hypertension    PONV (postoperative nausea and vomiting)    Right arm pain 05/29/2019   Stroke [facility])    posterior limb, R internal capsule stroke 07/2012     Current Outpatient Medications:    naproxen (NAPROSYN) 500 MG tablet, Take 1 tablet (500 mg total) by mouth 2 (two) times daily with a meal for 14 days., Disp: 28 tablet, Rfl: 0   acetaminophen (TYLENOL) 500 MG tablet, Take 1,000 mg by mouth every 6 (six) hours as needed for moderate pain or headache., Disp: , Rfl:    amLODipine (NORVASC) 10 MG tablet, Take 1 tablet (10 mg total) by mouth daily., Disp: 90 tablet, Rfl: 3   docusate sodium (COLACE) 50 MG capsule, Take 2 capsules (100 mg total) by mouth daily as needed for mild constipation., Disp: 10 capsule, Rfl: 1   Ginseng 250 MG CAPS, Take 250 mg by mouth daily., Disp: , Rfl:    guaiFENesin-codeine (ROBITUSSIN AC) 100-10 MG/5ML syrup, Take 5 mLs by mouth every 6 (six) hours as needed (cough)., Disp: 473 mL, Rfl: 0   ipratropium (ATROVENT) 0.03 % nasal spray, Place 2 sprays into both nostrils every 12 (twelve) hours., Disp: 30 mL, Rfl: 12   lidocaine (LIDODERM) 5 %, Place 1 patch onto the skin every 12 (twelve) hours. Remove & Discard patch within 12 hours or as directed by MD, Disp: 10 patch, Rfl: 0   Menthol, Topical Analgesic, (BIOFREEZE EX), Apply 1 application topically daily as needed (neck pain)., Disp: , Rfl:    Multiple Vitamins-Minerals (ADULT GUMMY PO), Take 2 capsules by mouth daily., Disp: , Rfl:    Omega-3 Fatty Acids (FISH OIL) 1000 MG CAPS, Take 1,000 mg by mouth  daily., Disp: , Rfl:    OVER THE COUNTER MEDICATION, Take 3 tablets by mouth daily as needed (before sex). Nugenix testosterone booster otc supplement, Disp: , Rfl:    pantoprazole (PROTONIX) 40 MG tablet, Take 1 tablet by mouth once daily, Disp: 90 tablet, Rfl: 1   phenylephrine (,USE FOR PREPARATION-H,) 0.25 % suppository, Place 1 suppository rectally 2 (two) times daily., Disp: 12 suppository, Rfl: 0   rosuvastatin (CRESTOR) 20 MG tablet, Take 1 tablet (20 mg total) by mouth daily., Disp: 90 tablet, Rfl: 3   tadalafil (CIALIS) 20 MG tablet, Take 1 tablet (20 mg total) by mouth daily as needed for erectile dysfunction., Disp: 30 tablet, Rfl: 2   triamterene-hydrochlorothiazide (DYAZIDE) 37.5-25 MG capsule, Take 1 each (1 capsule total) by mouth daily., Disp: 90 capsule, Rfl: 1  Review of Systems:  MSK: Patient endorses left hip pain and left knee pain   Physical Exam:  Vitals:   05/12/22 1434  BP: (!) 158/102  Pulse: 70  Temp: 97.8 F (36.6 C)  TempSrc: Oral  SpO2: 99%  Weight: 158 lb 9.6 oz (71.9 kg)  Height: '5\' 8"'$  (1.727 m)   General: Patient is sitting comfortably in the room  MSK: 5/5 strength  bilateral lower extremities.  Full active and passive range of motion of bilateral hips.  No tenderness noted to the left hip.  No bony abnormalities appreciated on left hip.   Assessment & Plan:   Pain of left hip Patient presents with a 72-monthhistory of left hip pain.  He states that this pain is aching/throbbing in nature.  He states that he has had 2 urgent care visits for this.  Initially he was given ibuprofen and prednisone for trochanteric bursitis on 04/13/2022.  This initially helped, and then the pain came back.  He states he went back to urgent care on 05/04/2022.  In which at that visit he was told he may have arthritis and had a shot of Toradol given to him.  He presents today with the same pain.  He describes it as a throbbing/aching pain that gets better with standing,  and worse with sitting.  He states that it worsens with walking as well.  Sometimes it is burning, and gets worse at night.  On exam, he has full range of motion of bilateral hips and knees in all ranges of motion.  He has 5/5 strength to bilateral lower extremities on hip flexion, hip extension, internal rotation, external rotation, and knee extension.  There is no tenderness at the greater trochanter.  No obvious deformities noted.  I am leaning either towards meralgia paresthetica versus IT band syndrome.  Plan: -Plan to refer to physical therapy -Short course of naproxen 500 mg twice daily with meals -Hip exercises provided for patient -Patient instructed to follow back up if does not resolve, as we may have to pursue imaging if this does not resolve   Patient seen with Dr. GWray Kearns DO PGY-1 Internal Medicine Resident  Pager: [phone]

## 2022-05-12 NOTE — Progress Notes (Signed)
Subjective:   [name] is a 66 y.o. male who presents for an Initial Medicare Annual Wellness Visit. I connected with  MATHHEW BUYSSE on 05/12/22 by a  Face-To-Face  enabled telemedicine application and verified that I am speaking with the correct person using two identifiers.  Patient Location: Other:  Office/Clinic  Provider Location: Office/Clinic  I discussed the limitations of evaluation and management by telemedicine. The patient expressed understanding and agreed to proceed.  Review of Systems    Defer to PCP       Objective:    Today's Vitals   05/12/22 1517 05/12/22 1518  BP: (!) 158/102   Pulse: 70   Temp: 97.8 F (36.6 C)   TempSrc: Oral   SpO2: 99%   Weight: 158 lb 9.6 oz (71.9 kg)   Height: '5\' 8"'$  (1.727 m)   PainSc:  8    Body mass index is 24.12 kg/m.     05/12/2022    3:20 PM 05/12/2022    2:41 PM 01/25/2022   12:26 PM 01/12/2022    1:32 PM 12/28/2021    2:36 PM 10/19/2021    9:16 AM 09/29/2021    2:35 PM  Advanced Directives  Does Patient Have a Medical Advance Directive? No No No No No No No  Would patient like information on creating a medical advance directive? No - Patient declined No - Patient declined No - Patient declined No - Patient declined No - Patient declined No - Patient declined No - Patient declined    Current Medications (verified) Outpatient Encounter Medications as of 05/12/2022  Medication Sig   acetaminophen (TYLENOL) 500 MG tablet Take 1,000 mg by mouth every 6 (six) hours as needed for moderate pain or headache.   amLODipine (NORVASC) 10 MG tablet Take 1 tablet (10 mg total) by mouth daily.   docusate sodium (COLACE) 50 MG capsule Take 2 capsules (100 mg total) by mouth daily as needed for mild constipation.   Ginseng 250 MG CAPS Take 250 mg by mouth daily.   guaiFENesin-codeine (ROBITUSSIN AC) 100-10 MG/5ML syrup Take 5 mLs by mouth every 6 (six) hours as needed (cough).   ipratropium (ATROVENT) 0.03 % nasal spray Place  2 sprays into both nostrils every 12 (twelve) hours.   lidocaine (LIDODERM) 5 % Place 1 patch onto the skin every 12 (twelve) hours. Remove & Discard patch within 12 hours or as directed by MD   Menthol, Topical Analgesic, (BIOFREEZE EX) Apply 1 application topically daily as needed (neck pain).   Multiple Vitamins-Minerals (ADULT GUMMY PO) Take 2 capsules by mouth daily.   Omega-3 Fatty Acids (FISH OIL) 1000 MG CAPS Take 1,000 mg by mouth daily.   OVER THE COUNTER MEDICATION Take 3 tablets by mouth daily as needed (before sex). Nugenix testosterone booster otc supplement   pantoprazole (PROTONIX) 40 MG tablet Take 1 tablet by mouth once daily   phenylephrine (,USE FOR PREPARATION-H,) 0.25 % suppository Place 1 suppository rectally 2 (two) times daily.   rosuvastatin (CRESTOR) 20 MG tablet Take 1 tablet (20 mg total) by mouth daily.   tadalafil (CIALIS) 20 MG tablet Take 1 tablet (20 mg total) by mouth daily as needed for erectile dysfunction.   triamterene-hydrochlorothiazide (DYAZIDE) 37.5-25 MG capsule Take 1 each (1 capsule total) by mouth daily.   No facility-administered encounter medications on file as of 05/12/2022.    Allergies (verified) Ace inhibitors and Penicillins   History: Past Medical History:  Diagnosis Date   Arthritis  due to age   Back pain 05/22/2019   Cocaine abuse ([place])    quit   ETOH abuse    quit   Headache    Hx of adenomatous polyp of colon    Hyperlipidemia    Hypertension    PONV (postoperative nausea and vomiting)    Right arm pain 05/29/2019   Stroke [facility])    posterior limb, R internal capsule stroke 07/2012   Past Surgical History:  Procedure Laterality Date   ANTERIOR CERVICAL DECOMPRESSION/DISCECTOMY FUSION 4 LEVELS N/A 02/18/2021   Procedure: ANTERIOR CERVICAL DECOMPRESSION FUSION CERVIAL 4- CERVICAL 5, CERVICAL 5- CERVICAL 6, CERVICAL 6- CERVICAL 7 WITH INSTRUMENTATION AND ALLOGRAFT;  Surgeon: Phylliss Bob, MD;  Location: [place];  Service:  Orthopedics;  Laterality: N/A;   bone spur repair Left    COLONOSCOPY     HAND SURGERY     left hand   Family History  Problem Relation Age of Onset   Pancreatic cancer Father    Colon cancer Neg Hx    Esophageal cancer Neg Hx    Rectal cancer Neg Hx    Stomach cancer Neg Hx    Social History   Socioeconomic History   Marital status: Single    Spouse name: Not on file   Number of children: Not on file   Years of education: Not on file   Highest education level: Not on file  Occupational History   Not on file  Tobacco Use   Smoking status: Former    Types: Cigarettes    Quit date: 07/18/2012    Years since quitting: 9.8   Smokeless tobacco: Never  Vaping Use   Vaping Use: Never used  Substance and Sexual Activity   Alcohol use: No    Alcohol/week: 0.0 standard drinks of alcohol    Comment: quit 2011   Drug use: No    Comment: h/o cocaine abuse   Sexual activity: Not on file  Other Topics Concern   Not on file  Social History Narrative   Not on file   Social Determinants of Health   Financial Resource Strain: Low Risk  (05/12/2022)   Overall Financial Resource Strain (CARDIA)    Difficulty of Paying Living Expenses: Not hard at all  Food Insecurity: No Food Insecurity (05/12/2022)   Hunger Vital Sign    Worried About Running Out of Food in the Last Year: Never true    Ran Out of Food in the Last Year: Never true  Transportation Needs: No Transportation Needs (05/12/2022)   PRAPARE - Hydrologist (Medical): No    Lack of Transportation (Non-Medical): No  Physical Activity: Unknown (05/12/2022)   Exercise Vital Sign    Days of Exercise per Week: 7 days    Minutes of Exercise per Session: Not on file  Stress: No Stress Concern Present (05/12/2022)   [place]    Feeling of Stress : Not at all  Social Connections: Moderately Isolated (05/12/2022)   Social Connection  and Isolation Panel [NHANES]    Frequency of Communication with Friends and Family: More than three times a week    Frequency of Social Gatherings with Friends and Family: More than three times a week    Attends Religious Services: More than 4 times per year    Active Member of [employer] or Organizations: No    Attends Archivist Meetings: Never    Marital Status: Separated  Tobacco Counseling Counseling given: Not Answered   Clinical Intake:  Pre-visit preparation completed: Yes  Pain : 0-10 Pain Score: 8  Pain Type: Acute pain Pain Location: Leg Pain Orientation: Left Pain Descriptors / Indicators: Aching Pain Onset: 1 to 4 weeks ago Pain Frequency: Constant Pain Relieving Factors: tylenol  Pain Relieving Factors: tylenol  Nutritional Risks: None Diabetes: No  How often do you need to have someone help you when you read instructions, pamphlets, or other written materials from your doctor or pharmacy?: 1 - Never What is the last grade level you completed in school?: 12th grade  Diabetic?No  Interpreter Needed?: No  Information entered by :: Corey Skains Irwin Toran,cma 05/12/22 3:19pm   Activities of Daily Living    05/12/2022    3:19 PM 05/12/2022    2:41 PM  In your present state of health, do you have any difficulty performing the following activities:  Hearing? 0 0  Vision? 1 1  Difficulty concentrating or making decisions? 0 0  Walking or climbing stairs? 0 0  Dressing or bathing? 0 0  Doing errands, shopping? 0 0    Patient Care Team: Riesa Pope, MD as PCP - General Juluis Mire, MD (Inactive) as PCP - Internal Medicine  Indicate any recent Medical Services you may have received from other than [facility] providers in the past year (date may be approximate).     Assessment:   This is a routine wellness examination for [name].  Hearing/Vision screen No results found.  Dietary issues and exercise activities discussed:     Goals Addressed    None   Depression Screen    05/12/2022    3:19 PM 05/12/2022    2:40 PM 02/04/2022   12:09 PM 01/25/2022   12:26 PM 01/12/2022    1:33 PM 12/28/2021    2:35 PM 10/19/2021    9:16 AM  PHQ 2/9 Scores  PHQ - 2 Score 0 0 0 0 0 0 0  PHQ- 9 Score       0    Fall Risk    05/12/2022    3:19 PM 05/12/2022    2:40 PM 02/04/2022   12:08 PM 01/25/2022   12:25 PM 01/12/2022    1:33 PM  [place] in the past year? 0 0 0 0 0  Number falls in past yr: 0 0 0 0   Injury with Fall? 0 0 0    Risk for fall due to : No Fall Risks No Fall Risks  No Fall Risks No Fall Risks  Follow up Falls evaluation completed;Falls prevention discussed Falls evaluation completed;Falls prevention discussed Falls evaluation completed Falls evaluation completed Falls evaluation completed    FALL RISK PREVENTION PERTAINING TO THE HOME:  Any stairs in or around the home? No  If so, are there any without handrails? No  Home free of loose throw rugs in walkways, pet beds, electrical cords, etc? Yes  Adequate lighting in your home to reduce risk of falls? Yes   ASSISTIVE DEVICES UTILIZED TO PREVENT FALLS:  Life alert? No  Use of a cane, walker or w/c? No  Grab bars in the bathroom? No  Shower chair or bench in shower? No  Elevated toilet seat or a handicapped toilet? No   TIMED UP AND GO:  Was the test performed? Yes .  Length of time to ambulate 10 feet: 1 min Gait slow and steady without use of assistive device  Cognitive Function:  05/12/2022    3:20 PM  6CIT Screen  What Year? 0 points  What month? 0 points  What time? 0 points  Count back from 20 0 points  Months in reverse 0 points  Repeat phrase 0 points  Total Score 0 points    Immunizations Immunization History  Administered Date(s) Administered   Influenza Whole 04/23/2018   Influenza,inj,Quad PF,6+ Mos 04/02/2013, 05/14/2014   Influenza,inj,quad, With Preservative 05/13/2019   Influenza-Unspecified 07/01/2021    PFIZER(Purple Top)SARS-COV-2 Vaccination 08/22/2019, 09/12/2019, 05/19/2020   Pneumococcal Polysaccharide-23 08/02/2005   Tdap 05/14/2014   Unspecified SARS-COV-2 Vaccination 08/22/2019    TDAP status: Up to date  Flu Vaccine status: Due, Education has been provided regarding the importance of this vaccine. Advised may receive this vaccine at local pharmacy or Health Dept. Aware to provide a copy of the vaccination record if obtained from local pharmacy or Health Dept. Verbalized acceptance and understanding.  Pneumococcal vaccine status: Due, Education has been provided regarding the importance of this vaccine. Advised may receive this vaccine at local pharmacy or Health Dept. Aware to provide a copy of the vaccination record if obtained from local pharmacy or Health Dept. Verbalized acceptance and understanding.  Covid-19 vaccine status: Completed vaccines  Qualifies for Shingles Vaccine? No   Zostavax completed No   Shingrix Completed?: No.    Education has been provided regarding the importance of this vaccine. Patient has been advised to call insurance company to determine out of pocket expense if they have not yet received this vaccine. Advised may also receive vaccine at local pharmacy or Health Dept. Verbalized acceptance and understanding.  Screening Tests Health Maintenance  Topic Date Due   Zoster Vaccines- Shingrix (1 of 2) Never done   Pneumonia Vaccine 75+ Years old (2 - PCV) 01/04/2020   INFLUENZA VACCINE  02/15/2022   Medicare Annual Wellness (AWV)  06/12/2023   TETANUS/TDAP  05/14/2024   COLONOSCOPY (Pts 45-71yr Insurance coverage will need to be confirmed)  03/27/2026   Hepatitis C Screening  Completed   HPV VACCINES  Aged Out   COVID-19 Vaccine  Discontinued    Health Maintenance  Health Maintenance Due  Topic Date Due   Zoster Vaccines- Shingrix (1 of 2) Never done   Pneumonia Vaccine 67 Years old (2 - PCV) 01/04/2020   INFLUENZA VACCINE  02/15/2022     Colorectal cancer screening: Type of screening: Colonoscopy. Completed [id number] Repeat every 7 years  Lung Cancer Screening: (Low Dose CT Chest recommended if Age 67-80years, 30 pack-year currently smoking OR have quit w/in 15years.) does not qualify.   Lung Cancer Screening Referral: N/A  Additional Screening:  Hepatitis C Screening: does not qualify; Completed 04/22/2015  Vision Screening: Recommended annual ophthalmology exams for early detection of glaucoma and other disorders of the eye. Is the patient up to date with their annual eye exam?  No  Who is the provider or what is the name of the office in which the patient attends annual eye exams? N/A If pt is not established with a provider, would they like to be referred to a provider to establish care? Yes .   Dental Screening: Recommended annual dental exams for proper oral hygiene  Community Resource Referral / Chronic Care Management: CRR required this visit?  No   CCM required this visit?  Yes      Plan:     I have personally reviewed and noted the following in the patient's chart:   Medical and social history Use of  alcohol, tobacco or illicit drugs  Current medications and supplements including opioid prescriptions. Patient is not currently taking opioid prescriptions. Functional ability and status Nutritional status Physical activity Advanced directives List of other physicians Hospitalizations, surgeries, and ER visits in previous 12 months Vitals Screenings to include cognitive, depression, and falls Referrals and appointments  In addition, I have reviewed and discussed with patient certain preventive protocols, quality metrics, and best practice recommendations. A written personalized care plan for preventive services as well as general preventive health recommendations were provided to patient.     Kerin Perna, [place]   05/12/2022   Nurse Notes: Face-To-Face visit.  [name] , Thank you for  taking time to come for your Medicare Wellness Visit. I appreciate your ongoing commitment to your health goals. Please review the following plan we discussed and let me know if I can assist you in the future.   These are the goals we discussed:  Goals      Blood Pressure < 140/90        This is a list of the screening recommended for you and due dates:  Health Maintenance  Topic Date Due   Zoster (Shingles) Vaccine (1 of 2) Never done   Pneumonia Vaccine (2 - PCV) 01/04/2020   Flu Shot  02/15/2022   Medicare Annual Wellness Visit  06/12/2023   Tetanus Vaccine  05/14/2024   Colon Cancer Screening  03/27/2026   Hepatitis C Screening: USPSTF Recommendation to screen - Ages 18-79 yo.  Completed   HPV Vaccine  Aged Out   COVID-19 Vaccine  Discontinued

## 2022-05-16 NOTE — Progress Notes (Signed)
Internal Medicine Clinic Attending  I saw and evaluated the patient.  I personally confirmed the key portions of the history and exam documented by Dr. Patel and I reviewed pertinent patient test results.  The assessment, diagnosis, and plan were formulated together and I agree with the documentation in the resident's note.  

## 2022-05-27 DIAGNOSIS — M25562 Pain in left knee: Secondary | ICD-10-CM | POA: Diagnosis not present

## 2022-05-27 DIAGNOSIS — M545 Low back pain, unspecified: Secondary | ICD-10-CM | POA: Diagnosis not present

## 2022-05-27 DIAGNOSIS — M25552 Pain in left hip: Secondary | ICD-10-CM | POA: Diagnosis not present

## 2022-06-01 NOTE — Addendum Note (Signed)
Addended by: Leigh Aurora on: 06/01/2022 04:41 PM   Modules accepted: Level of Service

## 2022-06-02 DIAGNOSIS — M5116 Intervertebral disc disorders with radiculopathy, lumbar region: Secondary | ICD-10-CM | POA: Diagnosis not present

## 2022-06-06 NOTE — Progress Notes (Signed)
Internal Medicine Clinic Attending  Case and documentation reviewed.  I reviewed the AWV findings.  I agree with the assessment, diagnosis, and plan of care documented in the AWV note.     

## 2022-06-08 DIAGNOSIS — M5116 Intervertebral disc disorders with radiculopathy, lumbar region: Secondary | ICD-10-CM | POA: Diagnosis not present

## 2022-06-16 DIAGNOSIS — M5116 Intervertebral disc disorders with radiculopathy, lumbar region: Secondary | ICD-10-CM | POA: Diagnosis not present

## 2022-06-23 DIAGNOSIS — M5116 Intervertebral disc disorders with radiculopathy, lumbar region: Secondary | ICD-10-CM | POA: Diagnosis not present

## 2022-06-30 DIAGNOSIS — M5116 Intervertebral disc disorders with radiculopathy, lumbar region: Secondary | ICD-10-CM | POA: Diagnosis not present

## 2022-07-07 DIAGNOSIS — M5116 Intervertebral disc disorders with radiculopathy, lumbar region: Secondary | ICD-10-CM | POA: Diagnosis not present

## 2022-07-08 DIAGNOSIS — M5116 Intervertebral disc disorders with radiculopathy, lumbar region: Secondary | ICD-10-CM | POA: Diagnosis not present

## 2022-07-20 ENCOUNTER — Ambulatory Visit (INDEPENDENT_AMBULATORY_CARE_PROVIDER_SITE_OTHER): Payer: Medicare Other | Admitting: Student

## 2022-07-20 VITALS — BP 129/85 | HR 68 | Temp 97.7°F | Ht 68.0 in | Wt 162.3 lb

## 2022-07-20 DIAGNOSIS — N529 Male erectile dysfunction, unspecified: Secondary | ICD-10-CM

## 2022-07-20 DIAGNOSIS — Z8673 Personal history of transient ischemic attack (TIA), and cerebral infarction without residual deficits: Secondary | ICD-10-CM | POA: Diagnosis not present

## 2022-07-20 DIAGNOSIS — M5432 Sciatica, left side: Secondary | ICD-10-CM

## 2022-07-20 DIAGNOSIS — R1032 Left lower quadrant pain: Secondary | ICD-10-CM | POA: Diagnosis not present

## 2022-07-20 DIAGNOSIS — M545 Low back pain, unspecified: Secondary | ICD-10-CM | POA: Diagnosis not present

## 2022-07-20 MED ORDER — VENLAFAXINE HCL ER 37.5 MG PO CP24: 37.5000 mg | ORAL_CAPSULE | Freq: Every day | ORAL | 1 refills | Status: DC

## 2022-07-20 MED ORDER — GABAPENTIN 100 MG PO CAPS: 100.0000 mg | ORAL_CAPSULE | Freq: Three times a day (TID) | ORAL | 0 refills | Status: DC

## 2022-07-20 NOTE — Assessment & Plan Note (Addendum)
Presents today for left sided groin pain. He had similar symptoms 6 months ago that resolved with conservative management and thought to be secondary to a muscular strain. He presents today for recurrent symptoms. The pain is worsened by movements, specifically walking. Denies changes in urinary symptoms or noticing swelling/bulging of the area. Denies fevers, chills, night sweats, or weight loss. Denies any inciting trauma to the area.   On exam he has no obvious bulge or hernia with supine or standing positions. There is no lymphadenopathy or skin changes present. The pain is not reproducible with deep palpation over the area. External and internal rotation difficult to assess pain as these movements exacerbate his sciatica.   With recurrence will plan for left hip xray to assess for arthritic changes. Do not suspect hernia, lymphadenopathy.   -left hip xray  -continue tylenol for pain control

## 2022-07-20 NOTE — Patient Instructions (Signed)
Thank you, [name] for allowing [name] to provide your care today. Today we discussed.  Left groin pain We will be getting an xray to check for any arthritis of the left hip. Continue tylenol   Low back pain Please follow up with orthopedics. We will try gabapentin to help with your nerve pain  I have ordered the following labs for you:  Lab Orders  No laboratory test(s) ordered today     Referrals ordered today:   Referral Orders  No referral(s) requested today     I have ordered the following medication/changed the following medications:     Start the following medications: Gabapentin   Follow up:  1 month follow up    Should you have any questions or concerns please call the internal medicine clinic at [phone].    Sanjuana Letters, D.O. [place]

## 2022-07-21 MED ORDER — GABAPENTIN 100 MG PO CAPS: 100.0000 mg | ORAL_CAPSULE | Freq: Three times a day (TID) | ORAL | 0 refills | Status: DC | PRN

## 2022-07-21 MED ORDER — TADALAFIL 20 MG PO TABS: 20.0000 mg | ORAL_TABLET | Freq: Every day | ORAL | 2 refills | Status: DC | PRN

## 2022-07-21 MED ORDER — ROSUVASTATIN CALCIUM 20 MG PO TABS: 20.0000 mg | ORAL_TABLET | Freq: Every day | ORAL | 3 refills | Status: DC

## 2022-07-21 MED ORDER — TRIAMTERENE-HCTZ 37.5-25 MG PO CAPS: 1.0000 | ORAL_CAPSULE | Freq: Every day | ORAL | 1 refills | Status: DC

## 2022-07-21 NOTE — Assessment & Plan Note (Signed)
Continues to have left sided hip pain that radiates down his leg consistent with sciatica. He is following with orthopedics and planning for an MRI of the lumbar spine 07/20/21. He notes tylenol is not helping his sciatic symptoms. Will trial gabapentin 100 mg TID PRN and have him continue to follow with orthopedics. May benefit from steroid injections.   -continue to follow with orthopedics -trial gabapentin, if no improvement can discontinue

## 2022-07-21 NOTE — Progress Notes (Signed)
Internal Medicine Clinic Attending  Case discussed with Dr. Katsadouros  At the time of the visit.  We reviewed the resident's history and exam and pertinent patient test results.  I agree with the assessment, diagnosis, and plan of care documented in the resident's note.  

## 2022-07-21 NOTE — Progress Notes (Signed)
CC: left groin pain  HPI:  [name] is a 67 y.o. male living with a history stated below and presents today for left groin pain. Please see problem based assessment and plan for additional details.  Past Medical History:  Diagnosis Date   Arthritis    due to age   Back pain 05/22/2019   Cocaine abuse ([place])    quit   ETOH abuse    quit   Headache    Hx of adenomatous polyp of colon    Hyperlipidemia    Hypertension    PONV (postoperative nausea and vomiting)    Right arm pain 05/29/2019   Stroke [name])    posterior limb, R internal capsule stroke 07/2012    Current Outpatient Medications on File Prior to Visit  Medication Sig Dispense Refill   acetaminophen (TYLENOL) 500 MG tablet Take 1,000 mg by mouth every 6 (six) hours as needed for moderate pain or headache.     amLODipine (NORVASC) 10 MG tablet Take 1 tablet (10 mg total) by mouth daily. 90 tablet 3   docusate sodium (COLACE) 50 MG capsule Take 2 capsules (100 mg total) by mouth daily as needed for mild constipation. 10 capsule 1   Ginseng 250 MG CAPS Take 250 mg by mouth daily.     guaiFENesin-codeine (ROBITUSSIN AC) 100-10 MG/5ML syrup Take 5 mLs by mouth every 6 (six) hours as needed (cough). 473 mL 0   ipratropium (ATROVENT) 0.03 % nasal spray Place 2 sprays into both nostrils every 12 (twelve) hours. 30 mL 12   Menthol, Topical Analgesic, (BIOFREEZE EX) Apply 1 application topically daily as needed (neck pain).     Multiple Vitamins-Minerals (ADULT GUMMY PO) Take 2 capsules by mouth daily.     Omega-3 Fatty Acids (FISH OIL) 1000 MG CAPS Take 1,000 mg by mouth daily.     OVER THE COUNTER MEDICATION Take 3 tablets by mouth daily as needed (before sex). Nugenix testosterone booster otc supplement     pantoprazole (PROTONIX) 40 MG tablet Take 1 tablet by mouth once daily 90 tablet 1   No current facility-administered medications on file prior to visit.    Review of Systems: ROS negative except for what is  noted on the assessment and plan.  Vitals:   07/20/22 1515  BP: 129/85  Pulse: 68  Temp: 97.7 F (36.5 C)  TempSrc: Oral  SpO2: 98%  Weight: 162 lb 4.8 oz (73.6 kg)  Height: '5\' 8"'$  (1.727 m)   Physical Exam: Constitutional: in no acute distress HENT: normocephalic atraumatic Eyes: conjunctiva non-erythematous Neck: supple Cardiovascular: regular rate and rhythm, no m/r/g Pulmonary/Chest: normal work of breathing on room air MSK: normal bulk and tone. Left inguinal area without masses or bulge. Overlying skin is intact without erythema. No focal tenderness in this region. Flexion, extension, ER/IR without tenderness in groin area.  Neurological: alert & oriented x 3 Skin: warm and dry Psych: normal mood  Assessment & Plan:   Left groin pain Presents today for left sided groin pain. He had similar symptoms 6 months ago that resolved with conservative management and thought to be secondary to a muscular strain. He presents today for recurrent symptoms. The pain is worsened by movements, specifically walking. Denies changes in urinary symptoms or noticing swelling/bulging of the area. Denies fevers, chills, night sweats, or weight loss. Denies any inciting trauma to the area.   On exam he has no obvious bulge or hernia with supine or standing positions. There  is no lymphadenopathy or skin changes present. The pain is not reproducible with deep palpation over the area. External and internal rotation difficult to assess pain as these movements exacerbate his sciatica.   With recurrence will plan for left hip xray to assess for arthritic changes. Do not suspect hernia, lymphadenopathy.   -left hip xray  -continue tylenol for pain control  Sciatica of left side Continues to have left sided hip pain that radiates down his leg consistent with sciatica. He is following with orthopedics and planning for an MRI of the lumbar spine 07/20/21. He notes tylenol is not helping his sciatic symptoms.  Will trial gabapentin 100 mg TID PRN and have him continue to follow with orthopedics. May benefit from steroid injections.   -continue to follow with orthopedics -trial gabapentin, if no improvement can discontinue  Patient discussed with Dr. Laurena Slimmer, D.O. [facility], PGY-3 Phone: [phone] Date 07/21/2022 Time 7:54 AM

## 2022-07-25 DIAGNOSIS — M545 Low back pain, unspecified: Secondary | ICD-10-CM | POA: Diagnosis not present

## 2022-08-09 ENCOUNTER — Other Ambulatory Visit: Payer: Self-pay | Admitting: Internal Medicine

## 2022-08-09 DIAGNOSIS — I10 Essential (primary) hypertension: Secondary | ICD-10-CM

## 2022-08-10 ENCOUNTER — Other Ambulatory Visit: Payer: Self-pay

## 2022-08-10 MED ORDER — TRIAMTERENE-HCTZ 37.5-25 MG PO CAPS: 1.0000 | ORAL_CAPSULE | Freq: Every day | ORAL | 1 refills | Status: DC

## 2022-08-18 DIAGNOSIS — M5416 Radiculopathy, lumbar region: Secondary | ICD-10-CM | POA: Diagnosis not present

## 2022-09-01 DIAGNOSIS — M5116 Intervertebral disc disorders with radiculopathy, lumbar region: Secondary | ICD-10-CM | POA: Diagnosis not present

## 2022-09-05 DIAGNOSIS — M5116 Intervertebral disc disorders with radiculopathy, lumbar region: Secondary | ICD-10-CM | POA: Diagnosis not present

## 2022-09-07 DIAGNOSIS — M5116 Intervertebral disc disorders with radiculopathy, lumbar region: Secondary | ICD-10-CM | POA: Diagnosis not present

## 2022-09-08 DIAGNOSIS — M5116 Intervertebral disc disorders with radiculopathy, lumbar region: Secondary | ICD-10-CM | POA: Diagnosis not present

## 2022-09-12 DIAGNOSIS — M5116 Intervertebral disc disorders with radiculopathy, lumbar region: Secondary | ICD-10-CM | POA: Diagnosis not present

## 2022-09-14 DIAGNOSIS — M5116 Intervertebral disc disorders with radiculopathy, lumbar region: Secondary | ICD-10-CM | POA: Diagnosis not present

## 2022-11-18 NOTE — Progress Notes (Deleted)
No notes entered for this encounter by resident physician.  Will close encounter.

## 2022-11-23 NOTE — Progress Notes (Signed)
error 

## 2022-12-19 ENCOUNTER — Encounter: Payer: Self-pay | Admitting: Student

## 2023-01-05 DIAGNOSIS — M5416 Radiculopathy, lumbar region: Secondary | ICD-10-CM | POA: Diagnosis not present

## 2023-01-31 DIAGNOSIS — M5416 Radiculopathy, lumbar region: Secondary | ICD-10-CM | POA: Diagnosis not present

## 2023-01-31 DIAGNOSIS — M5116 Intervertebral disc disorders with radiculopathy, lumbar region: Secondary | ICD-10-CM | POA: Diagnosis not present

## 2023-03-01 ENCOUNTER — Other Ambulatory Visit: Payer: Self-pay

## 2023-03-01 DIAGNOSIS — I10 Essential (primary) hypertension: Secondary | ICD-10-CM

## 2023-03-01 MED ORDER — AMLODIPINE BESYLATE 10 MG PO TABS
10.0000 mg | ORAL_TABLET | Freq: Every day | ORAL | 3 refills | Status: DC
Start: 2023-03-01 — End: 2023-07-13

## 2023-03-02 ENCOUNTER — Encounter: Payer: Medicare Other | Admitting: Student

## 2023-05-29 ENCOUNTER — Inpatient Hospital Stay (HOSPITAL_COMMUNITY)
Admission: EM | Admit: 2023-05-29 | Discharge: 2023-07-04 | DRG: 004 | Disposition: A | Discharge status: Rehabilitation Facility | Payer: Medicare Other

## 2023-05-29 ENCOUNTER — Emergency Department (HOSPITAL_COMMUNITY): Payer: Medicare Other

## 2023-05-29 ENCOUNTER — Inpatient Hospital Stay (HOSPITAL_COMMUNITY): Payer: Medicare Other

## 2023-05-29 DIAGNOSIS — R402362 Coma scale, best motor response, obeys commands, at arrival to emergency department: Secondary | ICD-10-CM | POA: Diagnosis present

## 2023-05-29 DIAGNOSIS — J9601 Acute respiratory failure with hypoxia: Secondary | ICD-10-CM | POA: Diagnosis not present

## 2023-05-29 DIAGNOSIS — S12100D Unspecified displaced fracture of second cervical vertebra, subsequent encounter for fracture with routine healing: Secondary | ICD-10-CM | POA: Diagnosis not present

## 2023-05-29 DIAGNOSIS — S270XXA Traumatic pneumothorax, initial encounter: Secondary | ICD-10-CM | POA: Diagnosis not present

## 2023-05-29 DIAGNOSIS — I82612 Acute embolism and thrombosis of superficial veins of left upper extremity: Secondary | ICD-10-CM | POA: Diagnosis not present

## 2023-05-29 DIAGNOSIS — S069XAA Unspecified intracranial injury with loss of consciousness status unknown, initial encounter: Secondary | ICD-10-CM | POA: Diagnosis not present

## 2023-05-29 DIAGNOSIS — J811 Chronic pulmonary edema: Secondary | ICD-10-CM | POA: Diagnosis not present

## 2023-05-29 DIAGNOSIS — R402242 Coma scale, best verbal response, confused conversation, at arrival to emergency department: Secondary | ICD-10-CM | POA: Diagnosis not present

## 2023-05-29 DIAGNOSIS — R402142 Coma scale, eyes open, spontaneous, at arrival to emergency department: Secondary | ICD-10-CM | POA: Diagnosis not present

## 2023-05-29 DIAGNOSIS — E861 Hypovolemia: Secondary | ICD-10-CM | POA: Diagnosis not present

## 2023-05-29 DIAGNOSIS — I517 Cardiomegaly: Secondary | ICD-10-CM | POA: Diagnosis not present

## 2023-05-29 DIAGNOSIS — J9503 Malfunction of tracheostomy stoma: Secondary | ICD-10-CM | POA: Diagnosis not present

## 2023-05-29 DIAGNOSIS — Z79899 Other long term (current) drug therapy: Secondary | ICD-10-CM

## 2023-05-29 DIAGNOSIS — I9589 Other hypotension: Secondary | ICD-10-CM | POA: Diagnosis not present

## 2023-05-29 DIAGNOSIS — J9600 Acute respiratory failure, unspecified whether with hypoxia or hypercapnia: Secondary | ICD-10-CM | POA: Diagnosis not present

## 2023-05-29 DIAGNOSIS — I611 Nontraumatic intracerebral hemorrhage in hemisphere, cortical: Secondary | ICD-10-CM | POA: Diagnosis not present

## 2023-05-29 DIAGNOSIS — G4700 Insomnia, unspecified: Secondary | ICD-10-CM | POA: Diagnosis not present

## 2023-05-29 DIAGNOSIS — E876 Hypokalemia: Secondary | ICD-10-CM | POA: Diagnosis not present

## 2023-05-29 DIAGNOSIS — J984 Other disorders of lung: Secondary | ICD-10-CM | POA: Diagnosis not present

## 2023-05-29 DIAGNOSIS — M25571 Pain in right ankle and joints of right foot: Secondary | ICD-10-CM | POA: Diagnosis not present

## 2023-05-29 DIAGNOSIS — Z4682 Encounter for fitting and adjustment of non-vascular catheter: Secondary | ICD-10-CM | POA: Diagnosis not present

## 2023-05-29 DIAGNOSIS — J939 Pneumothorax, unspecified: Secondary | ICD-10-CM | POA: Diagnosis not present

## 2023-05-29 DIAGNOSIS — I609 Nontraumatic subarachnoid hemorrhage, unspecified: Secondary | ICD-10-CM | POA: Diagnosis not present

## 2023-05-29 DIAGNOSIS — S12100A Unspecified displaced fracture of second cervical vertebra, initial encounter for closed fracture: Secondary | ICD-10-CM | POA: Diagnosis not present

## 2023-05-29 DIAGNOSIS — R7303 Prediabetes: Secondary | ICD-10-CM | POA: Diagnosis present

## 2023-05-29 DIAGNOSIS — T797XXA Traumatic subcutaneous emphysema, initial encounter: Secondary | ICD-10-CM | POA: Diagnosis not present

## 2023-05-29 DIAGNOSIS — K769 Liver disease, unspecified: Secondary | ICD-10-CM | POA: Diagnosis not present

## 2023-05-29 DIAGNOSIS — I6523 Occlusion and stenosis of bilateral carotid arteries: Secondary | ICD-10-CM | POA: Diagnosis not present

## 2023-05-29 DIAGNOSIS — Z4589 Encounter for adjustment and management of other implanted devices: Secondary | ICD-10-CM | POA: Diagnosis not present

## 2023-05-29 DIAGNOSIS — S32039D Unspecified fracture of third lumbar vertebra, subsequent encounter for fracture with routine healing: Secondary | ICD-10-CM | POA: Diagnosis not present

## 2023-05-29 DIAGNOSIS — J988 Other specified respiratory disorders: Secondary | ICD-10-CM | POA: Diagnosis not present

## 2023-05-29 DIAGNOSIS — K7689 Other specified diseases of liver: Secondary | ICD-10-CM | POA: Diagnosis present

## 2023-05-29 DIAGNOSIS — R451 Restlessness and agitation: Secondary | ICD-10-CM | POA: Diagnosis not present

## 2023-05-29 DIAGNOSIS — S062XAA Diffuse traumatic brain injury with loss of consciousness status unknown, initial encounter: Secondary | ICD-10-CM | POA: Diagnosis present

## 2023-05-29 DIAGNOSIS — Y93H2 Activity, gardening and landscaping: Secondary | ICD-10-CM

## 2023-05-29 DIAGNOSIS — S069X9A Unspecified intracranial injury with loss of consciousness of unspecified duration, initial encounter: Secondary | ICD-10-CM | POA: Diagnosis not present

## 2023-05-29 DIAGNOSIS — Z9911 Dependence on respirator [ventilator] status: Secondary | ICD-10-CM | POA: Diagnosis not present

## 2023-05-29 DIAGNOSIS — R339 Retention of urine, unspecified: Secondary | ICD-10-CM | POA: Diagnosis present

## 2023-05-29 DIAGNOSIS — Z888 Allergy status to other drugs, medicaments and biological substances status: Secondary | ICD-10-CM

## 2023-05-29 DIAGNOSIS — I700 Atherosclerosis of aorta: Secondary | ICD-10-CM | POA: Diagnosis not present

## 2023-05-29 DIAGNOSIS — S069XAD Unspecified intracranial injury with loss of consciousness status unknown, subsequent encounter: Secondary | ICD-10-CM | POA: Diagnosis not present

## 2023-05-29 DIAGNOSIS — R609 Edema, unspecified: Secondary | ICD-10-CM | POA: Diagnosis not present

## 2023-05-29 DIAGNOSIS — R1311 Dysphagia, oral phase: Secondary | ICD-10-CM | POA: Diagnosis not present

## 2023-05-29 DIAGNOSIS — J9382 Other air leak: Secondary | ICD-10-CM | POA: Diagnosis not present

## 2023-05-29 DIAGNOSIS — Z981 Arthrodesis status: Secondary | ICD-10-CM

## 2023-05-29 DIAGNOSIS — J9811 Atelectasis: Secondary | ICD-10-CM | POA: Diagnosis not present

## 2023-05-29 DIAGNOSIS — Z87891 Personal history of nicotine dependence: Secondary | ICD-10-CM

## 2023-05-29 DIAGNOSIS — I615 Nontraumatic intracerebral hemorrhage, intraventricular: Secondary | ICD-10-CM | POA: Diagnosis not present

## 2023-05-29 DIAGNOSIS — S12300A Unspecified displaced fracture of fourth cervical vertebra, initial encounter for closed fracture: Secondary | ICD-10-CM | POA: Diagnosis not present

## 2023-05-29 DIAGNOSIS — R0602 Shortness of breath: Secondary | ICD-10-CM | POA: Diagnosis not present

## 2023-05-29 DIAGNOSIS — I10 Essential (primary) hypertension: Secondary | ICD-10-CM | POA: Diagnosis present

## 2023-05-29 DIAGNOSIS — S066XAA Traumatic subarachnoid hemorrhage with loss of consciousness status unknown, initial encounter: Secondary | ICD-10-CM | POA: Diagnosis not present

## 2023-05-29 DIAGNOSIS — Z23 Encounter for immunization: Secondary | ICD-10-CM

## 2023-05-29 DIAGNOSIS — S32039A Unspecified fracture of third lumbar vertebra, initial encounter for closed fracture: Secondary | ICD-10-CM | POA: Diagnosis present

## 2023-05-29 DIAGNOSIS — R918 Other nonspecific abnormal finding of lung field: Secondary | ICD-10-CM | POA: Diagnosis not present

## 2023-05-29 DIAGNOSIS — Z930 Tracheostomy status: Secondary | ICD-10-CM | POA: Diagnosis not present

## 2023-05-29 DIAGNOSIS — S0101XA Laceration without foreign body of scalp, initial encounter: Secondary | ICD-10-CM | POA: Diagnosis present

## 2023-05-29 DIAGNOSIS — N401 Enlarged prostate with lower urinary tract symptoms: Secondary | ICD-10-CM | POA: Diagnosis present

## 2023-05-29 DIAGNOSIS — G40901 Epilepsy, unspecified, not intractable, with status epilepticus: Secondary | ICD-10-CM | POA: Diagnosis present

## 2023-05-29 DIAGNOSIS — S0990XA Unspecified injury of head, initial encounter: Secondary | ICD-10-CM | POA: Diagnosis not present

## 2023-05-29 DIAGNOSIS — S301XXA Contusion of abdominal wall, initial encounter: Secondary | ICD-10-CM | POA: Diagnosis not present

## 2023-05-29 DIAGNOSIS — M5416 Radiculopathy, lumbar region: Secondary | ICD-10-CM | POA: Diagnosis not present

## 2023-05-29 DIAGNOSIS — S12190A Other displaced fracture of second cervical vertebra, initial encounter for closed fracture: Secondary | ICD-10-CM | POA: Diagnosis not present

## 2023-05-29 DIAGNOSIS — Z800 Family history of malignant neoplasm of digestive organs: Secondary | ICD-10-CM

## 2023-05-29 DIAGNOSIS — J9690 Respiratory failure, unspecified, unspecified whether with hypoxia or hypercapnia: Secondary | ICD-10-CM | POA: Diagnosis not present

## 2023-05-29 DIAGNOSIS — E871 Hypo-osmolality and hyponatremia: Secondary | ICD-10-CM | POA: Diagnosis not present

## 2023-05-29 DIAGNOSIS — Z452 Encounter for adjustment and management of vascular access device: Secondary | ICD-10-CM | POA: Diagnosis not present

## 2023-05-29 DIAGNOSIS — R0989 Other specified symptoms and signs involving the circulatory and respiratory systems: Secondary | ICD-10-CM | POA: Diagnosis not present

## 2023-05-29 DIAGNOSIS — Z880 Allergy status to penicillin: Secondary | ICD-10-CM

## 2023-05-29 DIAGNOSIS — Z781 Physical restraint status: Secondary | ICD-10-CM

## 2023-05-29 DIAGNOSIS — E440 Moderate protein-calorie malnutrition: Secondary | ICD-10-CM | POA: Insufficient documentation

## 2023-05-29 DIAGNOSIS — S066XAD Traumatic subarachnoid hemorrhage with loss of consciousness status unknown, subsequent encounter: Secondary | ICD-10-CM | POA: Diagnosis not present

## 2023-05-29 DIAGNOSIS — R519 Headache, unspecified: Secondary | ICD-10-CM | POA: Diagnosis not present

## 2023-05-29 DIAGNOSIS — E785 Hyperlipidemia, unspecified: Secondary | ICD-10-CM | POA: Diagnosis not present

## 2023-05-29 DIAGNOSIS — R471 Dysarthria and anarthria: Secondary | ICD-10-CM | POA: Diagnosis not present

## 2023-05-29 DIAGNOSIS — R000 Tachycardia, unspecified: Secondary | ICD-10-CM | POA: Diagnosis not present

## 2023-05-29 DIAGNOSIS — S299XXA Unspecified injury of thorax, initial encounter: Secondary | ICD-10-CM | POA: Diagnosis not present

## 2023-05-29 DIAGNOSIS — M79602 Pain in left arm: Secondary | ICD-10-CM | POA: Diagnosis not present

## 2023-05-29 DIAGNOSIS — J90 Pleural effusion, not elsewhere classified: Secondary | ICD-10-CM | POA: Diagnosis not present

## 2023-05-29 DIAGNOSIS — S32059A Unspecified fracture of fifth lumbar vertebra, initial encounter for closed fracture: Secondary | ICD-10-CM | POA: Diagnosis present

## 2023-05-29 DIAGNOSIS — S12101A Unspecified nondisplaced fracture of second cervical vertebra, initial encounter for closed fracture: Secondary | ICD-10-CM | POA: Diagnosis not present

## 2023-05-29 DIAGNOSIS — S069XAS Unspecified intracranial injury with loss of consciousness status unknown, sequela: Secondary | ICD-10-CM | POA: Diagnosis not present

## 2023-05-29 DIAGNOSIS — Y92410 Unspecified street and highway as the place of occurrence of the external cause: Secondary | ICD-10-CM | POA: Diagnosis not present

## 2023-05-29 DIAGNOSIS — J439 Emphysema, unspecified: Secondary | ICD-10-CM | POA: Diagnosis not present

## 2023-05-29 DIAGNOSIS — E8770 Fluid overload, unspecified: Secondary | ICD-10-CM | POA: Diagnosis not present

## 2023-05-29 DIAGNOSIS — M19012 Primary osteoarthritis, left shoulder: Secondary | ICD-10-CM | POA: Diagnosis not present

## 2023-05-29 DIAGNOSIS — S06890A Other specified intracranial injury without loss of consciousness, initial encounter: Secondary | ICD-10-CM | POA: Diagnosis not present

## 2023-05-29 DIAGNOSIS — S32049A Unspecified fracture of fourth lumbar vertebra, initial encounter for closed fracture: Secondary | ICD-10-CM | POA: Diagnosis not present

## 2023-05-29 DIAGNOSIS — Z7982 Long term (current) use of aspirin: Secondary | ICD-10-CM

## 2023-05-29 DIAGNOSIS — G9349 Other encephalopathy: Secondary | ICD-10-CM | POA: Diagnosis not present

## 2023-05-29 DIAGNOSIS — M25512 Pain in left shoulder: Secondary | ICD-10-CM | POA: Diagnosis not present

## 2023-05-29 DIAGNOSIS — D75838 Other thrombocytosis: Secondary | ICD-10-CM | POA: Diagnosis present

## 2023-05-29 DIAGNOSIS — Z8673 Personal history of transient ischemic attack (TIA), and cerebral infarction without residual deficits: Secondary | ICD-10-CM

## 2023-05-29 DIAGNOSIS — S069X9S Unspecified intracranial injury with loss of consciousness of unspecified duration, sequela: Secondary | ICD-10-CM | POA: Diagnosis not present

## 2023-05-29 DIAGNOSIS — J989 Respiratory disorder, unspecified: Secondary | ICD-10-CM | POA: Diagnosis not present

## 2023-05-29 DIAGNOSIS — D6489 Other specified anemias: Secondary | ICD-10-CM | POA: Diagnosis not present

## 2023-05-29 DIAGNOSIS — R1314 Dysphagia, pharyngoesophageal phase: Secondary | ICD-10-CM | POA: Diagnosis not present

## 2023-05-29 DIAGNOSIS — Z041 Encounter for examination and observation following transport accident: Secondary | ICD-10-CM | POA: Diagnosis not present

## 2023-05-29 DIAGNOSIS — D62 Acute posthemorrhagic anemia: Secondary | ICD-10-CM | POA: Diagnosis not present

## 2023-05-29 DIAGNOSIS — S12600A Unspecified displaced fracture of seventh cervical vertebra, initial encounter for closed fracture: Secondary | ICD-10-CM | POA: Diagnosis not present

## 2023-05-29 DIAGNOSIS — S0003XA Contusion of scalp, initial encounter: Secondary | ICD-10-CM | POA: Diagnosis not present

## 2023-05-29 DIAGNOSIS — D171 Benign lipomatous neoplasm of skin and subcutaneous tissue of trunk: Secondary | ICD-10-CM | POA: Diagnosis not present

## 2023-05-29 DIAGNOSIS — R561 Post traumatic seizures: Secondary | ICD-10-CM | POA: Diagnosis not present

## 2023-05-29 LAB — CBC
HCT: 46.4 % (ref 39.0–52.0)
Hemoglobin: 15.4 g/dL (ref 13.0–17.0)
MCH: 28.4 pg (ref 26.0–34.0)
MCHC: 33.2 g/dL (ref 30.0–36.0)
MCV: 85.6 fL (ref 80.0–100.0)
Platelets: 292 10*3/uL (ref 150–400)
RBC: 5.42 MIL/uL (ref 4.22–5.81)
RDW: 12.5 % (ref 11.5–15.5)
WBC: 7.1 10*3/uL (ref 4.0–10.5)
nRBC: 0 % (ref 0.0–0.2)

## 2023-05-29 LAB — I-STAT CHEM 8, ED
BUN: 15 mg/dL (ref 8–23)
Calcium, Ion: 1.12 mmol/L — ABNORMAL LOW (ref 1.15–1.40)
Chloride: 99 mmol/L (ref 98–111)
Creatinine, Ser: 1.1 mg/dL (ref 0.61–1.24)
Glucose, Bld: 119 mg/dL — ABNORMAL HIGH (ref 70–99)
HCT: 47 % (ref 39.0–52.0)
Hemoglobin: 16 g/dL (ref 13.0–17.0)
Potassium: 3 mmol/L — ABNORMAL LOW (ref 3.5–5.1)
Sodium: 139 mmol/L (ref 135–145)
TCO2: 26 mmol/L (ref 22–32)

## 2023-05-29 LAB — POCT I-STAT 7, (LYTES, BLD GAS, ICA,H+H)
Acid-base deficit: 2 mmol/L (ref 0.0–2.0)
Bicarbonate: 23.7 mmol/L (ref 20.0–28.0)
Calcium, Ion: 1.11 mmol/L — ABNORMAL LOW (ref 1.15–1.40)
HCT: 40 % (ref 39.0–52.0)
Hemoglobin: 13.6 g/dL (ref 13.0–17.0)
O2 Saturation: 100 %
Patient temperature: 38.3
Potassium: 3.1 mmol/L — ABNORMAL LOW (ref 3.5–5.1)
Sodium: 136 mmol/L (ref 135–145)
TCO2: 25 mmol/L (ref 22–32)
pCO2 arterial: 43.9 mmHg (ref 32–48)
pH, Arterial: 7.347 — ABNORMAL LOW (ref 7.35–7.45)
pO2, Arterial: 275 mmHg — ABNORMAL HIGH (ref 83–108)

## 2023-05-29 LAB — COMPREHENSIVE METABOLIC PANEL
ALT: 22 U/L (ref 0–44)
AST: 23 U/L (ref 15–41)
Albumin: 4 g/dL (ref 3.5–5.0)
Alkaline Phosphatase: 52 U/L (ref 38–126)
Anion gap: 12 (ref 5–15)
BUN: 14 mg/dL (ref 8–23)
CO2: 27 mmol/L (ref 22–32)
Calcium: 9.3 mg/dL (ref 8.9–10.3)
Chloride: 100 mmol/L (ref 98–111)
Creatinine, Ser: 1.06 mg/dL (ref 0.61–1.24)
GFR, Estimated: >60 mL/min (ref >60)
Glucose, Bld: 119 mg/dL — ABNORMAL HIGH (ref 70–99)
Potassium: 3.1 mmol/L — ABNORMAL LOW (ref 3.5–5.1)
Sodium: 139 mmol/L (ref 135–145)
Total Bilirubin: 0.7 mg/dL (ref <1.2)
Total Protein: 7 g/dL (ref 6.5–8.1)

## 2023-05-29 LAB — ETHANOL: Alcohol, Ethyl (B): <10 mg/dL (ref <10)

## 2023-05-29 LAB — RAPID URINE DRUG SCREEN, HOSP PERFORMED
Amphetamines: NOT DETECTED
Barbiturates: NOT DETECTED
Benzodiazepines: NOT DETECTED
Cocaine: NOT DETECTED
Opiates: NOT DETECTED
Tetrahydrocannabinol: NOT DETECTED

## 2023-05-29 LAB — MRSA NEXT GEN BY PCR, NASAL: MRSA by PCR Next Gen: NOT DETECTED

## 2023-05-29 MED ORDER — SUCCINYLCHOLINE CHLORIDE 20 MG/ML IJ SOLN
INTRAMUSCULAR | Status: AC | PRN
  Administered 2023-05-29: 100 mg via INTRAVENOUS

## 2023-05-29 MED ORDER — POLYETHYLENE GLYCOL 3350 17 G PO PACK
17.0000 g | PACK | Freq: Every day | ORAL | Status: DC | PRN
  Administered 2023-06-15 – 2023-06-17 (×2): 17 g
  Filled 2023-05-29 (×4): qty 1

## 2023-05-29 MED ORDER — ORAL CARE MOUTH RINSE: 15.0000 mL | OROMUCOSAL | Status: DC | PRN

## 2023-05-29 MED ORDER — LEVETIRACETAM IN NACL 500 MG/100ML IV SOLN
500.0000 mg | Freq: Two times a day (BID) | INTRAVENOUS | Status: DC
  Administered 2023-05-30 – 2023-05-31 (×3): 500 mg via INTRAVENOUS
  Filled 2023-05-29 (×3): qty 100

## 2023-05-29 MED ORDER — ACETAMINOPHEN 500 MG PO TABS
1000.0000 mg | ORAL_TABLET | Freq: Four times a day (QID) | ORAL | Status: DC
  Administered 2023-05-29 – 2023-05-30 (×2): 1000 mg
  Filled 2023-05-29 (×2): qty 2

## 2023-05-29 MED ORDER — CHLORHEXIDINE GLUCONATE CLOTH 2 % EX PADS
6.0000 | MEDICATED_PAD | Freq: Every day | CUTANEOUS | Status: DC
  Administered 2023-05-29 – 2023-05-31 (×2): 6 via TOPICAL

## 2023-05-29 MED ORDER — FENTANYL BOLUS VIA INFUSION
25.0000 ug | INTRAVENOUS | Status: DC | PRN
Start: 2023-05-29 — End: 2023-05-30
  Administered 2023-05-29 – 2023-05-30 (×4): 50 ug via INTRAVENOUS

## 2023-05-29 MED ORDER — METHOCARBAMOL 500 MG PO TABS
500.0000 mg | ORAL_TABLET | Freq: Three times a day (TID) | ORAL | Status: DC
  Filled 2023-05-29: qty 1

## 2023-05-29 MED ORDER — TETANUS-DIPHTH-ACELL PERTUSSIS 5-2.5-18.5 LF-MCG/0.5 IM SUSY
0.5000 mL | PREFILLED_SYRINGE | Freq: Once | INTRAMUSCULAR | Status: AC
  Administered 2023-05-29: 0.5 mL via INTRAMUSCULAR

## 2023-05-29 MED ORDER — ACETAMINOPHEN 500 MG PO TABS
1000.0000 mg | ORAL_TABLET | Freq: Four times a day (QID) | ORAL | Status: DC
  Filled 2023-05-29: qty 2

## 2023-05-29 MED ORDER — LEVETIRACETAM IN NACL 1000 MG/100ML IV SOLN
1000.0000 mg | Freq: Once | INTRAVENOUS | Status: AC
  Administered 2023-05-29: 1000 mg via INTRAVENOUS
  Filled 2023-05-29: qty 100

## 2023-05-29 MED ORDER — ONDANSETRON 4 MG PO TBDP: 4.0000 mg | ORAL_TABLET | Freq: Four times a day (QID) | ORAL | Status: DC | PRN

## 2023-05-29 MED ORDER — ETOMIDATE 2 MG/ML IV SOLN
INTRAVENOUS | Status: AC | PRN
  Administered 2023-05-29: 20 mg via INTRAVENOUS

## 2023-05-29 MED ORDER — HYDRALAZINE HCL 20 MG/ML IJ SOLN
10.0000 mg | INTRAMUSCULAR | Status: DC | PRN
  Administered 2023-06-16 – 2023-06-17 (×3): 10 mg via INTRAVENOUS
  Filled 2023-05-29 (×4): qty 1

## 2023-05-29 MED ORDER — IOPAMIDOL (ISOVUE-370) INJECTION 76%
50.0000 mL | Freq: Once | INTRAVENOUS | Status: AC | PRN
  Administered 2023-05-29: 50 mL via INTRAVENOUS

## 2023-05-29 MED ORDER — DOCUSATE SODIUM 50 MG/5ML PO LIQD
100.0000 mg | Freq: Two times a day (BID) | ORAL | Status: DC
  Administered 2023-05-30 – 2023-06-07 (×13): 100 mg
  Filled 2023-05-29 (×15): qty 10

## 2023-05-29 MED ORDER — LORAZEPAM 2 MG/ML IJ SOLN
INTRAMUSCULAR | Status: AC
  Administered 2023-05-29: 2 mg via INTRAVENOUS
  Filled 2023-05-29: qty 1

## 2023-05-29 MED ORDER — SODIUM CHLORIDE 0.9 % IV SOLN: INTRAVENOUS | Status: AC

## 2023-05-29 MED ORDER — POLYETHYLENE GLYCOL 3350 17 G PO PACK
17.0000 g | PACK | Freq: Every day | ORAL | Status: DC | PRN
Start: 2023-05-29 — End: 2023-05-29

## 2023-05-29 MED ORDER — LORAZEPAM 2 MG/ML IJ SOLN: 2.0000 mg | Freq: Once | INTRAMUSCULAR | Status: AC

## 2023-05-29 MED ORDER — ORAL CARE MOUTH RINSE
15.0000 mL | OROMUCOSAL | Status: DC
  Administered 2023-05-30 (×10): 15 mL via OROMUCOSAL

## 2023-05-29 MED ORDER — POLYETHYLENE GLYCOL 3350 17 G PO PACK
17.0000 g | PACK | Freq: Every day | ORAL | Status: DC
  Administered 2023-05-30 – 2023-06-02 (×3): 17 g
  Filled 2023-05-29 (×3): qty 1

## 2023-05-29 MED ORDER — METOPROLOL TARTRATE 5 MG/5ML IV SOLN
5.0000 mg | Freq: Four times a day (QID) | INTRAVENOUS | Status: DC | PRN
  Administered 2023-06-16: 5 mg via INTRAVENOUS
  Filled 2023-05-29: qty 5

## 2023-05-29 MED ORDER — FENTANYL 2500MCG IN NS 250ML (10MCG/ML) PREMIX INFUSION
25.0000 ug/h | INTRAVENOUS | Status: DC
  Administered 2023-05-29: 25 ug/h via INTRAVENOUS
  Filled 2023-05-29: qty 250

## 2023-05-29 MED ORDER — PROPOFOL 1000 MG/100ML IV EMUL
0.0000 ug/kg/min | INTRAVENOUS | Status: DC
  Administered 2023-05-29: 30 ug/kg/min via INTRAVENOUS
  Administered 2023-05-29: 5 ug/kg/min via INTRAVENOUS
  Administered 2023-05-30: 25 ug/kg/min via INTRAVENOUS
  Filled 2023-05-29 (×3): qty 100

## 2023-05-29 MED ORDER — LEVETIRACETAM IN NACL 1000 MG/100ML IV SOLN
2000.0000 mg | Freq: Once | INTRAVENOUS | Status: AC
  Administered 2023-05-29: 2000 mg via INTRAVENOUS

## 2023-05-29 MED ORDER — ONDANSETRON HCL 4 MG/2ML IJ SOLN
4.0000 mg | Freq: Four times a day (QID) | INTRAMUSCULAR | Status: DC | PRN
  Administered 2023-05-30 – 2023-06-06 (×3): 4 mg via INTRAVENOUS
  Filled 2023-05-29 (×10): qty 2

## 2023-05-29 MED ORDER — METHOCARBAMOL 1000 MG/10ML IJ SOLN
500.0000 mg | Freq: Three times a day (TID) | INTRAMUSCULAR | Status: DC
Start: 2023-05-29 — End: 2023-06-01

## 2023-05-29 MED ORDER — IOPAMIDOL (ISOVUE-370) INJECTION 76%
75.0000 mL | Freq: Once | INTRAVENOUS | Status: AC | PRN
  Administered 2023-05-29: 75 mL via INTRAVENOUS

## 2023-05-29 MED ORDER — DOCUSATE SODIUM 100 MG PO CAPS: 100.0000 mg | ORAL_CAPSULE | Freq: Two times a day (BID) | ORAL | Status: DC

## 2023-05-29 MED ORDER — CEFAZOLIN SODIUM-DEXTROSE 2-4 GM/100ML-% IV SOLN
2.0000 g | Freq: Once | INTRAVENOUS | Status: AC
  Administered 2023-05-29: 2 g via INTRAVENOUS

## 2023-05-29 NOTE — ED Notes (Signed)
EEG tech currently in room
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
## 2023-05-29 NOTE — Progress Notes (Signed)
[facility] NOT monitoring ed hookup. HU charge captured

## 2023-05-29 NOTE — Progress Notes (Signed)
PT started on LTM as per Melynda Ripple. Pt being intubated with leads unglued on head. Wires to be maint and glued after intubation
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
## 2023-05-29 NOTE — ED Notes (Signed)
Family updated.

## 2023-05-29 NOTE — ED Notes (Signed)
X-ray present

## 2023-05-29 NOTE — ED Provider Notes (Signed)
Procedure Name: Intubation Date/Time: 05/29/2023 7:20 PM  Performed by: Karmen Stabs, MDOxygen Delivery Method: Ambu bag Preoxygenation: Pre-oxygenation with 100% oxygen Induction Type: Rapid sequence Laryngoscope Size: Glidescope and 4 Grade View: Grade I Tube size: 7.5 mm Number of attempts: 1 Airway Equipment and Method: Video-laryngoscopy and Rigid stylet Secured at: 24 cm Tube secured with: ETT holder Dental Injury: Teeth and Oropharynx as per pre-operative assessment        ETT placement confirmed by auscultation. Breath sounds symmetric and equal. CXR ordered. See Dr. Arlington Calix note for further detail and MDM.   Karmen Stabs, MD 05/29/23 Loreli Slot, MD 05/30/23 0001

## 2023-05-29 NOTE — ED Notes (Signed)
Neuro surgeon present
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
## 2023-05-29 NOTE — Progress Notes (Signed)
Pt transported from room 11 to Ct 1 and then to 4N15 with RNX2 and RT on 100% O2 without any complications.

## 2023-05-29 NOTE — Progress Notes (Signed)
LTM EEG running - no initial skin breakdown - push button tested - neuro notified.  

## 2023-05-29 NOTE — ED Notes (Addendum)
ED doctor present. RT present

## 2023-05-29 NOTE — ED Notes (Signed)
Patient transferred to CT by TRN and primary nurse.

## 2023-05-29 NOTE — ED Notes (Signed)
5 staples placed by Rubin Payor MD.

## 2023-05-29 NOTE — Progress Notes (Signed)
EEG complete - results pending 

## 2023-05-29 NOTE — Progress Notes (Signed)
ST read requested. Melynda Ripple reading
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
## 2023-05-29 NOTE — ED Notes (Addendum)
Patient currently following limited commands with Selena Batten NP neuro surgery

## 2023-05-29 NOTE — Progress Notes (Signed)
Leads glued, pt running on EEG. ATRIUM MONITORING. HU charge captured

## 2023-05-29 NOTE — ED Notes (Signed)
Patient continues to make snoring sounds, MD present

## 2023-05-29 NOTE — Progress Notes (Signed)
PT leads not glued on due to intubation and taken directly to ct. Leads to be reapplied after ct as schedule allows
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
## 2023-05-29 NOTE — ED Notes (Signed)
Patient having seizure activity, clonic and tonic movement. MD present
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
## 2023-05-29 NOTE — ED Notes (Signed)
Trauma Response Nurse Documentation   [name] is a 67 y.o. male arriving to [facility] ED via {Trauma ED/EMS:26864}  On {meds; anticoagulants:31417}. Trauma was activated as a {Trauma Level:26868} by *** based on the following trauma criteria {Trauma criteria:26865}.  Patient cleared for CT by Dr. Marland Kitchen Pt transported to {TRN Radiology:26861::"CT"} with trauma response nurse present to monitor. RN remained with the patient throughout their absence from the department for clinical observation.   [facility] ***.  History   Past Medical History:  Diagnosis Date   Arthritis    due to age   Back pain 05/22/2019   Cocaine abuse (HCC)    quit   ETOH abuse    quit   Headache    Hx of adenomatous polyp of colon    Hyperlipidemia    Hypertension    PONV (postoperative nausea and vomiting)    Right arm pain 05/29/2019   Stroke [facility])    posterior limb, R internal capsule stroke 07/2012     Past Surgical History:  Procedure Laterality Date   ANTERIOR CERVICAL DECOMPRESSION/DISCECTOMY FUSION 4 LEVELS N/A 02/18/2021   Procedure: ANTERIOR CERVICAL DECOMPRESSION FUSION CERVIAL 4- CERVICAL 5, CERVICAL 5- CERVICAL 6, CERVICAL 6- CERVICAL 7 WITH INSTRUMENTATION AND ALLOGRAFT;  Surgeon: Estill Bamberg, MD;  Location: [facility] OR;  Service: Orthopedics;  Laterality: N/A;   bone spur repair Left    COLONOSCOPY     HAND SURGERY     left hand       Initial Focused Assessment (If applicable, or please see trauma documentation):   CT's Completed:   {Trauma CT:26866}   Interventions:   Plan for disposition:  {Trauma Dispo:26867}   Consults completed:  {Trauma Consults:26862} at ***.  Event Summary:  MTP Summary (If applicable):   Bedside handoff with {Trauma handoff:26863::"ED RN"} ***.    [name]  Trauma Response RN  Please call TRN at [phone] for further assistance.

## 2023-05-29 NOTE — ED Notes (Signed)
Neuro NP present.

## 2023-05-29 NOTE — ED Notes (Signed)
Patient currently having uncontrolled tremors, not following verbal commands. Snoring sounds.

## 2023-05-29 NOTE — ED Notes (Signed)
Patient continues to have clonic/tonic movement, Trauma surgery doctor present.

## 2023-05-30 ENCOUNTER — Inpatient Hospital Stay (HOSPITAL_COMMUNITY): Payer: Medicare Other

## 2023-05-30 DIAGNOSIS — R561 Post traumatic seizures: Secondary | ICD-10-CM | POA: Diagnosis not present

## 2023-05-30 LAB — CBC
HCT: 35.9 % — ABNORMAL LOW (ref 39.0–52.0)
Hemoglobin: 12.1 g/dL — ABNORMAL LOW (ref 13.0–17.0)
MCH: 28.9 pg (ref 26.0–34.0)
MCHC: 33.7 g/dL (ref 30.0–36.0)
MCV: 85.7 fL (ref 80.0–100.0)
Platelets: 254 10*3/uL (ref 150–400)
RBC: 4.19 MIL/uL — ABNORMAL LOW (ref 4.22–5.81)
RDW: 12.7 % (ref 11.5–15.5)
WBC: 8.1 10*3/uL (ref 4.0–10.5)
nRBC: 0 % (ref 0.0–0.2)

## 2023-05-30 LAB — PHOSPHORUS: Phosphorus: 2.8 mg/dL (ref 2.5–4.6)

## 2023-05-30 LAB — BASIC METABOLIC PANEL
Anion gap: 6 (ref 5–15)
BUN: 11 mg/dL (ref 8–23)
CO2: 25 mmol/L (ref 22–32)
Calcium: 7.5 mg/dL — ABNORMAL LOW (ref 8.9–10.3)
Chloride: 108 mmol/L (ref 98–111)
Creatinine, Ser: 1.06 mg/dL (ref 0.61–1.24)
GFR, Estimated: >60 mL/min (ref >60)
Glucose, Bld: 134 mg/dL — ABNORMAL HIGH (ref 70–99)
Potassium: 3.2 mmol/L — ABNORMAL LOW (ref 3.5–5.1)
Sodium: 139 mmol/L (ref 135–145)

## 2023-05-30 LAB — GLUCOSE, CAPILLARY
Glucose-Capillary: 132 mg/dL — ABNORMAL HIGH (ref 70–99)
Glucose-Capillary: 105 mg/dL — ABNORMAL HIGH (ref 70–99)

## 2023-05-30 LAB — MAGNESIUM: Magnesium: 1.5 mg/dL — ABNORMAL LOW (ref 1.7–2.4)

## 2023-05-30 LAB — HIV ANTIBODY (ROUTINE TESTING W REFLEX): HIV Screen 4th Generation wRfx: NONREACTIVE

## 2023-05-30 LAB — TRIGLYCERIDES: Triglycerides: 91 mg/dL (ref <150)

## 2023-05-30 MED ORDER — NOREPINEPHRINE 4 MG/250ML-% IV SOLN
INTRAVENOUS | Status: AC
  Administered 2023-05-30: 2 ug/min via INTRAVENOUS
  Filled 2023-05-30: qty 250

## 2023-05-30 MED ORDER — NOREPINEPHRINE 4 MG/250ML-% IV SOLN
0.0000 ug/min | INTRAVENOUS | Status: DC
  Administered 2023-05-30: 13 ug/min via INTRAVENOUS
  Filled 2023-05-30: qty 250

## 2023-05-30 MED ORDER — SODIUM CHLORIDE 0.9 % IV SOLN: 250.0000 mL | INTRAVENOUS | Status: DC

## 2023-05-30 MED ORDER — NOREPINEPHRINE 4 MG/250ML-% IV SOLN: 0.0000 ug/min | INTRAVENOUS | Status: DC

## 2023-05-30 MED ORDER — MIDAZOLAM HCL 2 MG/2ML IJ SOLN
2.0000 mg | INTRAMUSCULAR | Status: DC | PRN
  Administered 2023-06-01 – 2023-06-16 (×5): 2 mg via INTRAVENOUS
  Filled 2023-05-30 (×9): qty 2

## 2023-05-30 MED ORDER — PANTOPRAZOLE SODIUM 40 MG IV SOLR
40.0000 mg | INTRAVENOUS | Status: DC
  Administered 2023-05-30 – 2023-05-31 (×2): 40 mg via INTRAVENOUS
  Filled 2023-05-30 (×2): qty 10

## 2023-05-30 MED ORDER — PROSOURCE TF20 ENFIT COMPATIBL EN LIQD
60.0000 mL | Freq: Every day | ENTERAL | Status: DC
  Administered 2023-05-30: 60 mL
  Filled 2023-05-30: qty 60

## 2023-05-30 MED ORDER — DEXMEDETOMIDINE HCL IN NACL 400 MCG/100ML IV SOLN
0.0000 ug/kg/h | INTRAVENOUS | Status: DC
Start: 2023-05-30 — End: 2023-06-02
  Administered 2023-05-30: 1.2 ug/kg/h via INTRAVENOUS
  Administered 2023-05-30: 0.4 ug/kg/h via INTRAVENOUS
  Administered 2023-05-30: 1.2 ug/kg/h via INTRAVENOUS
  Administered 2023-05-31: 1 ug/kg/h via INTRAVENOUS
  Administered 2023-05-31: 1.2 ug/kg/h via INTRAVENOUS
  Administered 2023-06-01: 0.8 ug/kg/h via INTRAVENOUS
  Administered 2023-06-01 (×2): 1 ug/kg/h via INTRAVENOUS
  Administered 2023-06-02: 0.9 ug/kg/h via INTRAVENOUS
  Filled 2023-05-30: qty 200
  Filled 2023-05-30 (×4): qty 100
  Filled 2023-05-30: qty 200
  Filled 2023-05-30 (×3): qty 100

## 2023-05-30 MED ORDER — POTASSIUM CHLORIDE 20 MEQ PO PACK
40.0000 meq | PACK | Freq: Two times a day (BID) | ORAL | Status: AC
  Administered 2023-05-30: 40 meq
  Filled 2023-05-30: qty 2

## 2023-05-30 MED ORDER — OXYCODONE HCL 5 MG PO TABS
5.0000 mg | ORAL_TABLET | ORAL | Status: DC | PRN
  Administered 2023-05-30 – 2023-06-03 (×13): 10 mg
  Administered 2023-06-04: 5 mg
  Administered 2023-06-09 – 2023-06-23 (×9): 10 mg
  Administered 2023-06-23: 5 mg
  Administered 2023-06-23: 10 mg
  Administered 2023-06-24: 5 mg
  Administered 2023-06-24: 10 mg
  Administered 2023-06-24: 5 mg
  Administered 2023-06-25 (×4): 10 mg
  Administered 2023-06-26: 5 mg
  Administered 2023-06-26 (×2): 10 mg
  Administered 2023-06-27: 5 mg
  Administered 2023-06-27: 10 mg
  Administered 2023-06-27: 5 mg
  Administered 2023-06-28 – 2023-07-01 (×11): 10 mg
  Administered 2023-07-02: 5 mg
  Administered 2023-07-02: 10 mg
  Administered 2023-07-02 (×2): 5 mg
  Administered 2023-07-03: 10 mg
  Filled 2023-05-30: qty 2
  Filled 2023-05-30: qty 1
  Filled 2023-05-30 (×7): qty 2
  Filled 2023-05-30: qty 1
  Filled 2023-05-30 (×2): qty 2
  Filled 2023-05-30: qty 1
  Filled 2023-05-30 (×6): qty 2
  Filled 2023-05-30: qty 1
  Filled 2023-05-30: qty 2
  Filled 2023-05-30: qty 1
  Filled 2023-05-30 (×2): qty 2
  Filled 2023-05-30: qty 1
  Filled 2023-05-30: qty 2
  Filled 2023-05-30: qty 1
  Filled 2023-05-30 (×7): qty 2
  Filled 2023-05-30: qty 1
  Filled 2023-05-30: qty 2
  Filled 2023-05-30: qty 1
  Filled 2023-05-30 (×19): qty 2

## 2023-05-30 MED ORDER — METHOCARBAMOL 1000 MG/10ML IJ SOLN
500.0000 mg | Freq: Three times a day (TID) | INTRAMUSCULAR | Status: AC
  Administered 2023-05-30 – 2023-05-31 (×4): 500 mg via INTRAVENOUS
  Filled 2023-05-30 (×4): qty 10

## 2023-05-30 MED ORDER — PIVOT 1.5 CAL PO LIQD: 1000.0000 mL | ORAL | Status: DC

## 2023-05-30 MED ORDER — ORAL CARE MOUTH RINSE
15.0000 mL | OROMUCOSAL | Status: DC
  Administered 2023-05-30 – 2023-05-31 (×6): 15 mL via OROMUCOSAL

## 2023-05-30 MED ORDER — NOREPINEPHRINE 4 MG/250ML-% IV SOLN: 2.0000 ug/min | INTRAVENOUS | Status: DC

## 2023-05-30 MED ORDER — FENTANYL CITRATE PF 50 MCG/ML IJ SOSY
50.0000 ug | PREFILLED_SYRINGE | INTRAMUSCULAR | Status: DC | PRN
  Administered 2023-05-30 – 2023-06-01 (×7): 50 ug via INTRAVENOUS
  Filled 2023-05-30 (×7): qty 1

## 2023-05-30 MED ORDER — DEXMEDETOMIDINE HCL IN NACL 400 MCG/100ML IV SOLN
INTRAVENOUS | Status: AC
  Filled 2023-05-30: qty 100

## 2023-05-30 MED ORDER — METHOCARBAMOL 500 MG PO TABS
500.0000 mg | ORAL_TABLET | Freq: Three times a day (TID) | ORAL | Status: AC
  Administered 2023-05-30 – 2023-06-01 (×3): 500 mg
  Filled 2023-05-30: qty 1

## 2023-05-30 MED ORDER — SODIUM CHLORIDE 0.9% FLUSH
10.0000 mL | Freq: Two times a day (BID) | INTRAVENOUS | Status: DC
  Administered 2023-05-30 – 2023-07-04 (×69): 10 mL via INTRAVENOUS

## 2023-05-30 MED ORDER — SODIUM CHLORIDE 0.9 % IV BOLUS
1000.0000 mL | Freq: Once | INTRAVENOUS | Status: AC
  Administered 2023-05-30: 1000 mL via INTRAVENOUS

## 2023-05-30 MED ORDER — LORAZEPAM 2 MG/ML IJ SOLN
2.0000 mg | INTRAMUSCULAR | Status: DC | PRN
  Administered 2023-05-30 – 2023-06-02 (×12): 2 mg via INTRAVENOUS
  Filled 2023-05-30 (×9): qty 1
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
## 2023-05-30 NOTE — Progress Notes (Signed)
Arterial Catheter Insertion Procedure Note  [name]  [id number]  [birth year]  Date:05/30/23  Time:2:22 AM    Provider Performing: Paschal Dopp    Procedure: Insertion of Arterial Line (40981) without US guidance  Indication(s) Blood pressure monitoring and/or need for frequent ABGs  Consent Unable to obtain consent due to emergent nature of procedure.  Anesthesia None   Time Out Verified patient identification, verified procedure, site/side was marked, verified correct patient position, special equipment/implants available, medications/allergies/relevant history reviewed, required imaging and test results available.   Sterile Technique Maximal sterile technique including full sterile barrier drape, hand hygiene, sterile gown, sterile gloves, mask, hair covering, sterile ultrasound probe cover (if used).   Procedure Description Area of catheter insertion was cleaned with chlorhexidine and draped in sterile fashion. Without real-time ultrasound guidance an arterial catheter was placed into the right radial artery.  Appropriate arterial tracings confirmed on monitor.     Complications/Tolerance None; patient tolerated the procedure well.   EBL Minimal   Specimen(s) None
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
## 2023-05-30 NOTE — Progress Notes (Signed)
vLTM maintenance  All impedances below 10kohms.  No skin breakdown noted at  FP1  FP2  A1  A2
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
## 2023-05-30 NOTE — Progress Notes (Signed)
OT Cancellation Note  Patient Details Name: [name] MRN: [id number] DOB: April 19, 1956   Cancelled Treatment:    Reason Eval/Treat Not Completed: Active bedrest order (Discussed wiht NSg. Will assess when activity orders updated.)  [facility] 05/30/2023, 9:15 AM Luisa Dago, OT/L   Acute OT Clinical Specialist Acute Rehabilitation Services Pager [phone] Office [phone]

## 2023-05-30 NOTE — Procedures (Signed)
Extubation Procedure Note  Patient Details:   Name: [name] DOB: [birth year] MRN: [id number]   Airway Documentation:    Vent end date: 05/30/23 Vent end time: 1120   Evaluation  O2 sats: stable throughout Complications: No apparent complications Patient did tolerate procedure well. Bilateral Breath Sounds: Clear, Diminished    Patient extubated per written order with RT, RT student and RN at bedside. Vitals stable, no stridor noted post extubation. Cuff leak was present prior to extubation. No complications noted.     Yes  Shannan Slinker Remonia Richter 05/30/2023, 11:33 AM

## 2023-05-30 NOTE — Procedures (Signed)
At the bedside in 4 and 15 the patient's right neck was prepped and draped in usual sterile fashion.  I attempted to a subclavian approach due to the patient's cervical spine fracture but was unable to access the subclavian vein.  I removed the patient's c-collar and attempted a right internal jugular approach.  Ultrasound guidance was used and was able to access the internal jugular vein.  A wire was passed through the needle.  A skin incision was made at the wire insertion site.  The dilator was passed over the wire.  The triple-lumen catheter was passed over the wire and positioned at 16 cm at the skin.  The catheter was fixed in place using a suture to fix it to the skin.  A sterile dressing was applied.  All lumens drew blood and flushed easily.  A chest x-ray will be ordered to check for pneumothorax with the failed subclavian attempt.  The patient tolerated the procedure well.  I will ask respiratory therapy to place a A-line and give the patient a bolus and start Levophed to support his blood pressure.

## 2023-05-30 NOTE — Progress Notes (Signed)
PT Cancellation Note  Patient Details Name: [name] MRN: [id number] DOB: 01-11-1956   Cancelled Treatment:    Reason Eval/Treat Not Completed: Medical issues which prohibited therapy - pt extubated this am, pt with period of agitation requiring sedation medication, RN requests PT hold. Will check back tomorrow.  Marye Round, PT DPT Acute Rehabilitation Services Secure Chat Preferred  Office [phone]    Truddie Coco 05/30/2023, 3:03 PM

## 2023-05-30 NOTE — Plan of Care (Signed)
Prelim EEG review negative for sz. Full report in the AM.  -- Milon Dikes, MD Neurologist [facility]

## 2023-05-30 NOTE — TOC CAGE-AID Note (Signed)
Transition of Care [facility]) - CAGE-AID Screening   Patient Details  Name: [name] MRN: [id number] Date of Birth: February 02, 1956  Hewitt Shorts, RN Trauma Response Nurse Phone Number: [phone] 05/30/2023, 5:01 PM      CAGE-AID Screening:    Have You Ever Felt You Ought to Cut Down on Your Drinking or Drug Use?: No Have People Annoyed You By [employer] Your Drinking Or Drug Use?: No Have You Felt Bad Or Guilty About Your Drinking Or Drug Use?: No Have You Ever Had a Drink or Used Drugs First Thing In The Morning to Steady Your Nerves or to Get Rid of a Hangover?: No CAGE-AID Score: 0  Substance Abuse Education Offered: No (Pt stated on arrival to ED that he had not had anything to drink in a long time- also verified by family that pt has quit drinking years ago)
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
## 2023-05-31 ENCOUNTER — Inpatient Hospital Stay (HOSPITAL_COMMUNITY): Payer: Medicare Other

## 2023-05-31 ENCOUNTER — Other Ambulatory Visit (HOSPITAL_COMMUNITY): Payer: Self-pay

## 2023-05-31 ENCOUNTER — Telehealth (HOSPITAL_COMMUNITY): Payer: Self-pay | Admitting: Pharmacy Technician

## 2023-05-31 DIAGNOSIS — R561 Post traumatic seizures: Secondary | ICD-10-CM | POA: Diagnosis not present

## 2023-05-31 LAB — GLUCOSE, CAPILLARY
Glucose-Capillary: 115 mg/dL — ABNORMAL HIGH (ref 70–99)
Glucose-Capillary: 88 mg/dL (ref 70–99)
Glucose-Capillary: 88 mg/dL (ref 70–99)
Glucose-Capillary: 123 mg/dL — ABNORMAL HIGH (ref 70–99)
Glucose-Capillary: 114 mg/dL — ABNORMAL HIGH (ref 70–99)
Glucose-Capillary: 98 mg/dL (ref 70–99)

## 2023-05-31 LAB — CBC
HCT: 34 % — ABNORMAL LOW (ref 39.0–52.0)
Hemoglobin: 11.1 g/dL — ABNORMAL LOW (ref 13.0–17.0)
MCH: 28.6 pg (ref 26.0–34.0)
MCHC: 32.6 g/dL (ref 30.0–36.0)
MCV: 87.6 fL (ref 80.0–100.0)
Platelets: 186 10*3/uL (ref 150–400)
RBC: 3.88 MIL/uL — ABNORMAL LOW (ref 4.22–5.81)
RDW: 12.6 % (ref 11.5–15.5)
WBC: 6.2 10*3/uL (ref 4.0–10.5)
nRBC: 0 % (ref 0.0–0.2)

## 2023-05-31 LAB — BASIC METABOLIC PANEL
Anion gap: 8 (ref 5–15)
BUN: 12 mg/dL (ref 8–23)
CO2: 25 mmol/L (ref 22–32)
Calcium: 7.6 mg/dL — ABNORMAL LOW (ref 8.9–10.3)
Chloride: 106 mmol/L (ref 98–111)
Creatinine, Ser: 1.11 mg/dL (ref 0.61–1.24)
GFR, Estimated: >60 mL/min (ref >60)
Glucose, Bld: 98 mg/dL (ref 70–99)
Potassium: 3.4 mmol/L — ABNORMAL LOW (ref 3.5–5.1)
Sodium: 139 mmol/L (ref 135–145)

## 2023-05-31 LAB — PHOSPHORUS
Phosphorus: 2.9 mg/dL (ref 2.5–4.6)
Phosphorus: 2.2 mg/dL — ABNORMAL LOW (ref 2.5–4.6)

## 2023-05-31 LAB — MAGNESIUM
Magnesium: 1.7 mg/dL (ref 1.7–2.4)
Magnesium: 1.7 mg/dL (ref 1.7–2.4)

## 2023-05-31 MED ORDER — LEVETIRACETAM IN NACL 500 MG/100ML IV SOLN
500.0000 mg | Freq: Two times a day (BID) | INTRAVENOUS | Status: DC
  Administered 2023-05-31 – 2023-06-01 (×2): 500 mg via INTRAVENOUS
  Filled 2023-05-31 (×2): qty 100

## 2023-05-31 MED ORDER — PROSOURCE TF20 ENFIT COMPATIBL EN LIQD: 60.0000 mL | Freq: Every day | ENTERAL | Status: DC

## 2023-05-31 MED ORDER — ACETAMINOPHEN 10 MG/ML IV SOLN
1000.0000 mg | Freq: Four times a day (QID) | INTRAVENOUS | Status: AC
  Administered 2023-05-31 – 2023-06-01 (×4): 1000 mg via INTRAVENOUS
  Filled 2023-05-31 (×4): qty 100

## 2023-05-31 MED ORDER — CHLORHEXIDINE GLUCONATE CLOTH 2 % EX PADS
6.0000 | MEDICATED_PAD | Freq: Every day | CUTANEOUS | Status: DC
  Administered 2023-05-31 – 2023-06-29 (×33): 6 via TOPICAL

## 2023-05-31 MED ORDER — POTASSIUM CHLORIDE 20 MEQ PO PACK: 40.0000 meq | PACK | Freq: Once | ORAL | Status: DC

## 2023-05-31 MED ORDER — LORAZEPAM 2 MG/ML IJ SOLN
2.0000 mg | INTRAMUSCULAR | Status: DC | PRN
  Filled 2023-05-31 (×3): qty 1

## 2023-05-31 MED ORDER — PIVOT 1.5 CAL PO LIQD
1000.0000 mL | ORAL | Status: DC
  Administered 2023-05-31 – 2023-06-04 (×3): 1000 mL

## 2023-05-31 MED ORDER — SODIUM CHLORIDE 0.9 % IV SOLN: INTRAVENOUS | Status: AC

## 2023-05-31 MED ORDER — PIVOT 1.5 CAL PO LIQD: 1000.0000 mL | ORAL | Status: DC

## 2023-05-31 MED ORDER — CLONAZEPAM 0.5 MG PO TABS
0.5000 mg | ORAL_TABLET | Freq: Two times a day (BID) | ORAL | Status: DC
  Administered 2023-05-31 – 2023-06-01 (×3): 0.5 mg
  Filled 2023-05-31 (×3): qty 1

## 2023-05-31 NOTE — Progress Notes (Signed)
Maint done, no skin breakdown

## 2023-05-31 NOTE — Progress Notes (Addendum)
SLP Cancellation Note  Patient Details Name: [name] MRN: [id number] DOB: [birth year]   Cancelled treatment:       Reason Eval/Treat Not Completed: Fatigue/lethargy limiting ability to participate. Pt not rousing adequately for PO trials. RN asks SLP to f/u next date.    Gwynneth Aliment, M.A., CF-SLP Speech Language Pathology, Acute Rehabilitation Services  Secure Chat preferred [phone]  05/31/2023, 10:07 AM

## 2023-05-31 NOTE — Procedures (Signed)
Cortrak  Person Inserting Tube:  Greig Castilla D, RD Tube Type:  Cortrak - 43 inches Tube Size:  10 Tube Location:  Left nare Secured by: Bridle Technique Used to Measure Tube Placement:  Marking at nare/corner of mouth Cortrak Secured At:  73 cm Procedure Comments:  Cortrak Tube Team Note:  Consult received to place a Cortrak feeding tube.   X-ray is required, abdominal x-ray has been ordered by the Cortrak team. Please confirm tube placement before using the Cortrak tube.   If the tube becomes dislodged please keep the tube and contact the Cortrak team at [email or web address] for replacement.  If after hours and replacement cannot be delayed, place a NG tube and confirm placement with an abdominal x-ray.    Greig Castilla, RD, LDN Registered Dietitian II RD pager # available in AMION  After hours/weekend pager # available in [facility]

## 2023-05-31 NOTE — Progress Notes (Signed)
OT Cancellation Note  Patient Details Name: [name] MRN: [id number] DOB: 25-Sep-1955   Cancelled Treatment:    Reason Eval/Treat Not Completed: Other (comment) (no appropriate for therapy at this time. RN communicated to update therapy when most appropriate)  [name] 05/31/2023, 12:30 PM
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
## 2023-05-31 NOTE — Progress Notes (Signed)
LTM EEG D/C'd. No skin break down from EEG. [facility] notified.

## 2023-05-31 NOTE — Telephone Encounter (Signed)
Pharmacy Patient Advocate Encounter  Received notification from [facility] that Prior Authorization for Valtoco 15 MG Dose 7.5MG /0.1ML liquid  has been APPROVED from 05/30/2024 to 07/17/2024. Ran test claim, Copay is $1,156.08. This test claim was processed through [facility]- copay amounts may vary at other pharmacies due to pharmacy/plan contracts, or as the patient moves through the different stages of their insurance plan.   PA #/Case ID/Reference #: [id number]
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
## 2023-05-31 NOTE — Progress Notes (Signed)
PT Cancellation Note  Patient Details Name: [name] MRN: [id number] DOB: 1956-01-16   Cancelled Treatment:    Reason Eval/Treat Not Completed: Medical issues which prohibited therapy; noted pt not appropriate for PT today.  Will continue attempts.    Elray Mcgregor 05/31/2023, 1:57 PM Sheran Lawless, PT Acute Rehabilitation Services Office:[phone] 05/31/2023

## 2023-05-31 NOTE — Progress Notes (Signed)
Patient ID: [name], male   DOB: [birth year], 67 y.o.   MRN: [id number] Patient peers back to his baseline somewhat agitated moves all extremities with equal strength.  CAT scan reviewed stable no signs of increased intracranial pressure cisterns are open sulci are patent and not effaced  Probable patient still having seizure activity so recommend continued treatment defer to neurology any medication adjustment
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
## 2023-05-31 NOTE — Progress Notes (Signed)
MRI attempted at 2300, ativan given prior to going down to MRI and patient on precedex drip. Patient occasionally coughing, gagging on secretions in throat, and spitting up/vomiting due to secretions and then being suctioned. Small amount of brown/gray emesis. Patient cannot tolerate reduced or flat head of bed at this time and is at risk for aspiration. MRI not completed at this time.

## 2023-05-31 NOTE — Progress Notes (Addendum)
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 disease. Electronically Signed   By: Tish Frederickson M.D.   On: 05/29/2023 17:48    Assessment/Plan: Continuous EEG not showing seizure activity per neurology. Will repeat head  CT to make sure nothing acutely has changed. Will await results.   *Ct head reviewed, unchanged from previous CT. Does not explain his intermittent lethargy. Appreciate neurology's input on this.   LOS: 2 days    Tiana Loft Isidor Bromell 05/31/2023, 10:15 AM
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
## 2023-05-31 NOTE — Telephone Encounter (Signed)
Pharmacy Patient Advocate Encounter   Received notification that prior authorization for Valtoco 15 MG Dose 7.5MG /0.1ML liquid  is required/requested.   Insurance verification completed.   The patient is insured through [facility] .   Per test claim: PA required; PA submitted to above mentioned insurance via CoverMyMeds Key/confirmation #/EOC [id number] Status is pending

## 2023-05-31 NOTE — TOC Benefit Eligibility Note (Signed)
Patient Product/process development scientist completed.    The patient is insured through [facility]. Patient has Medicare and is not eligible for a copay card, but may be able to apply for patient assistance, if available.    Ran test claim for Valtoco 15 mg and Requires Prior Authorization   This test claim was processed through [employer]- copay amounts may vary at other pharmacies due to [employer], or as the patient moves through the different stages of their insurance plan.     Roland Earl, CPHT Pharmacy Technician III Certified Patient Advocate [facility]ocate [facility] Pharm[facility] Patient Advocate Team Direct Number: [phone]  Fax: [phone]
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
## 2023-06-01 ENCOUNTER — Inpatient Hospital Stay (HOSPITAL_COMMUNITY): Payer: Medicare Other

## 2023-06-01 DIAGNOSIS — E440 Moderate protein-calorie malnutrition: Secondary | ICD-10-CM | POA: Insufficient documentation

## 2023-06-01 DIAGNOSIS — G40901 Epilepsy, unspecified, not intractable, with status epilepticus: Secondary | ICD-10-CM | POA: Diagnosis not present

## 2023-06-01 LAB — CBC
HCT: 30.6 % — ABNORMAL LOW (ref 39.0–52.0)
Hemoglobin: 10.1 g/dL — ABNORMAL LOW (ref 13.0–17.0)
MCH: 29.1 pg (ref 26.0–34.0)
MCHC: 33 g/dL (ref 30.0–36.0)
MCV: 88.2 fL (ref 80.0–100.0)
Platelets: 167 10*3/uL (ref 150–400)
RBC: 3.47 MIL/uL — ABNORMAL LOW (ref 4.22–5.81)
RDW: 12.8 % (ref 11.5–15.5)
WBC: 4.4 10*3/uL (ref 4.0–10.5)
nRBC: 0 % (ref 0.0–0.2)

## 2023-06-01 LAB — BASIC METABOLIC PANEL
Anion gap: 5 (ref 5–15)
BUN: 15 mg/dL (ref 8–23)
CO2: 25 mmol/L (ref 22–32)
Calcium: 7.3 mg/dL — ABNORMAL LOW (ref 8.9–10.3)
Chloride: 106 mmol/L (ref 98–111)
Creatinine, Ser: 1.09 mg/dL (ref 0.61–1.24)
GFR, Estimated: >60 mL/min (ref >60)
Glucose, Bld: 148 mg/dL — ABNORMAL HIGH (ref 70–99)
Potassium: 3.2 mmol/L — ABNORMAL LOW (ref 3.5–5.1)
Sodium: 136 mmol/L (ref 135–145)

## 2023-06-01 LAB — GLUCOSE, CAPILLARY
Glucose-Capillary: 138 mg/dL — ABNORMAL HIGH (ref 70–99)
Glucose-Capillary: 116 mg/dL — ABNORMAL HIGH (ref 70–99)
Glucose-Capillary: 116 mg/dL — ABNORMAL HIGH (ref 70–99)
Glucose-Capillary: 158 mg/dL — ABNORMAL HIGH (ref 70–99)
Glucose-Capillary: 124 mg/dL — ABNORMAL HIGH (ref 70–99)

## 2023-06-01 LAB — MAGNESIUM: Magnesium: 1.8 mg/dL (ref 1.7–2.4)

## 2023-06-01 LAB — PHOSPHORUS: Phosphorus: 1.2 mg/dL — ABNORMAL LOW (ref 2.5–4.6)

## 2023-06-01 MED ORDER — RACEPINEPHRINE HCL 2.25 % IN NEBU
INHALATION_SOLUTION | RESPIRATORY_TRACT | Status: AC
  Administered 2023-06-01: 0.5 mL via RESPIRATORY_TRACT
  Filled 2023-06-01: qty 0.5

## 2023-06-01 MED ORDER — SODIUM CHLORIDE 0.9 % IV SOLN
250.0000 mg | Freq: Once | INTRAVENOUS | Status: AC
  Administered 2023-06-01: 250 mg via INTRAVENOUS
  Filled 2023-06-01: qty 2.5

## 2023-06-01 MED ORDER — MAGNESIUM SULFATE 2 GM/50ML IV SOLN
2.0000 g | Freq: Once | INTRAVENOUS | Status: AC
  Administered 2023-06-01: 2 g via INTRAVENOUS
  Filled 2023-06-01: qty 50

## 2023-06-01 MED ORDER — LIDOCAINE HCL (PF) 1 % IJ SOLN
INTRAMUSCULAR | Status: AC
  Administered 2023-06-01: 5 mL
  Filled 2023-06-01: qty 5

## 2023-06-01 MED ORDER — SODIUM CHLORIDE 0.9 % IV SOLN
750.0000 mg | Freq: Two times a day (BID) | INTRAVENOUS | Status: DC
  Administered 2023-06-01 – 2023-06-02 (×2): 750 mg via INTRAVENOUS
  Filled 2023-06-01 (×3): qty 7.5

## 2023-06-01 MED ORDER — POTASSIUM PHOSPHATES 15 MMOLE/5ML IV SOLN
45.0000 mmol | Freq: Once | INTRAVENOUS | Status: AC
  Administered 2023-06-01: 45 mmol via INTRAVENOUS
  Filled 2023-06-01: qty 15

## 2023-06-01 MED ORDER — POTASSIUM CHLORIDE 20 MEQ PO PACK: 40.0000 meq | PACK | Freq: Two times a day (BID) | ORAL | Status: DC
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
## 2023-06-01 NOTE — Procedures (Signed)
Chest tube insertion  Date/Time: 06/01/2023 8:05 AM  Performed by: Violeta Gelinas, MD Authorized by: Violeta Gelinas, MD   Consent:    Consent obtained:  Written Pre-procedure details:    Skin preparation:  ChloraPrep Sedation:    Sedation type:  Anxiolysis Anesthesia (see MAR for exact dosages):    Anesthesia method:  Local infiltration Procedure details:    Placement location:  R lateral   Tube size (Fr):  20   Technique: blunt     Dissection instrument:  Kelly clamp   Tube connected to:  Suction   Suture material:  0 silk   Dressing:  4x4 sterile gauze  CXR pending  Violeta Gelinas, MD, MPH, FACS Please use [email or web address] to contact on call provider

## 2023-06-01 NOTE — Progress Notes (Addendum)
SLP Cancellation Note  Patient Details Name: [name] MRN: [id number] DOB: Oct 31, 1955   Cancelled treatment:       Reason Eval/Treat Not Completed: Fatigue/lethargy limiting ability to participate. Discussed POC with RN and pt's family. Will continue following.   Gwynneth Aliment, M.A., CF-SLP Speech Language Pathology, Acute Rehabilitation Services  Secure Chat preferred [phone]  06/01/2023, 10:42 AM

## 2023-06-01 NOTE — Progress Notes (Signed)
OT Cancellation Note  Patient Details Name: [name] MRN: [id number] DOB: 1955-12-27   Cancelled Treatment:    Reason Eval/Treat Not Completed: Patient not medically ready (Nsg asking to hold due to agitation adn respiratory status.)  Epsie Walthall,HILLARY 06/01/2023, 2:49 PM Luisa Dago, OT/L   Acute OT Clinical Specialist Acute Rehabilitation Services Pager [phone] Office [phone]
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
## 2023-06-01 NOTE — Progress Notes (Signed)
PT Cancellation Note  Patient Details Name: DELONTA MALVEAUX MRN: [id number] DOB: Aug 10, 1955   Cancelled Treatment:    Reason Eval/Treat Not Completed: Medical issues which prohibited therapy;Patient not medically ready (agitation and respiratory status). Will check back tomorrow.  Lyanne Co, PT  Acute Rehab Services Secure chat preferred Office [phone]    Lawana Chambers Michial Disney 06/01/2023, 1:22 PM

## 2023-06-02 ENCOUNTER — Inpatient Hospital Stay (HOSPITAL_COMMUNITY): Payer: Medicare Other

## 2023-06-02 ENCOUNTER — Inpatient Hospital Stay (HOSPITAL_COMMUNITY): Payer: Medicare Other | Admitting: Anesthesiology

## 2023-06-02 DIAGNOSIS — S0990XA Unspecified injury of head, initial encounter: Secondary | ICD-10-CM

## 2023-06-02 DIAGNOSIS — G40901 Epilepsy, unspecified, not intractable, with status epilepticus: Secondary | ICD-10-CM | POA: Diagnosis not present

## 2023-06-02 LAB — GLUCOSE, CAPILLARY
Glucose-Capillary: 127 mg/dL — ABNORMAL HIGH (ref 70–99)
Glucose-Capillary: 127 mg/dL — ABNORMAL HIGH (ref 70–99)
Glucose-Capillary: 125 mg/dL — ABNORMAL HIGH (ref 70–99)
Glucose-Capillary: 130 mg/dL — ABNORMAL HIGH (ref 70–99)
Glucose-Capillary: 110 mg/dL — ABNORMAL HIGH (ref 70–99)

## 2023-06-02 LAB — POCT I-STAT 7, (LYTES, BLD GAS, ICA,H+H)
Acid-Base Excess: 4 mmol/L — ABNORMAL HIGH (ref 0.0–2.0)
Bicarbonate: 26.5 mmol/L (ref 20.0–28.0)
Calcium, Ion: 1.16 mmol/L (ref 1.15–1.40)
HCT: 28 % — ABNORMAL LOW (ref 39.0–52.0)
Hemoglobin: 9.5 g/dL — ABNORMAL LOW (ref 13.0–17.0)
O2 Saturation: 93 %
Potassium: 3.9 mmol/L (ref 3.5–5.1)
Sodium: 137 mmol/L (ref 135–145)
TCO2: 27 mmol/L (ref 22–32)
pCO2 arterial: 32.7 mmHg (ref 32–48)
pH, Arterial: 7.516 — ABNORMAL HIGH (ref 7.35–7.45)
pO2, Arterial: 60 mmHg — ABNORMAL LOW (ref 83–108)

## 2023-06-02 LAB — CBC
HCT: 32.1 % — ABNORMAL LOW (ref 39.0–52.0)
Hemoglobin: 10.6 g/dL — ABNORMAL LOW (ref 13.0–17.0)
MCH: 28.5 pg (ref 26.0–34.0)
MCHC: 33 g/dL (ref 30.0–36.0)
MCV: 86.3 fL (ref 80.0–100.0)
Platelets: 200 10*3/uL (ref 150–400)
RBC: 3.72 MIL/uL — ABNORMAL LOW (ref 4.22–5.81)
RDW: 12.6 % (ref 11.5–15.5)
WBC: 4.9 10*3/uL (ref 4.0–10.5)
nRBC: 0 % (ref 0.0–0.2)

## 2023-06-02 LAB — BASIC METABOLIC PANEL
Anion gap: 9 (ref 5–15)
BUN: 9 mg/dL (ref 8–23)
CO2: 26 mmol/L (ref 22–32)
Calcium: 7.9 mg/dL — ABNORMAL LOW (ref 8.9–10.3)
Chloride: 105 mmol/L (ref 98–111)
Creatinine, Ser: 0.77 mg/dL (ref 0.61–1.24)
GFR, Estimated: >60 mL/min (ref >60)
Glucose, Bld: 127 mg/dL — ABNORMAL HIGH (ref 70–99)
Potassium: 3 mmol/L — ABNORMAL LOW (ref 3.5–5.1)
Sodium: 140 mmol/L (ref 135–145)

## 2023-06-02 LAB — MAGNESIUM: Magnesium: 2.3 mg/dL (ref 1.7–2.4)

## 2023-06-02 LAB — PHOSPHORUS: Phosphorus: 2.2 mg/dL — ABNORMAL LOW (ref 2.5–4.6)

## 2023-06-02 MED ORDER — DEXMEDETOMIDINE HCL IN NACL 400 MCG/100ML IV SOLN
0.0000 ug/kg/h | INTRAVENOUS | Status: DC
  Administered 2023-06-02 (×2): 0.9 ug/kg/h via INTRAVENOUS
  Administered 2023-06-03: 0.7 ug/kg/h via INTRAVENOUS
  Administered 2023-06-03 (×3): 0.8 ug/kg/h via INTRAVENOUS
  Administered 2023-06-04: 1 ug/kg/h via INTRAVENOUS
  Administered 2023-06-04: 0.7 ug/kg/h via INTRAVENOUS
  Administered 2023-06-04: 0.6 ug/kg/h via INTRAVENOUS
  Administered 2023-06-05 (×2): 0.8 ug/kg/h via INTRAVENOUS
  Administered 2023-06-05 (×2): 1.2 ug/kg/h via INTRAVENOUS
  Administered 2023-06-06: 0.9 ug/kg/h via INTRAVENOUS
  Administered 2023-06-06 – 2023-06-07 (×4): 1.1 ug/kg/h via INTRAVENOUS
  Administered 2023-06-07 – 2023-06-08 (×4): 1 ug/kg/h via INTRAVENOUS
  Administered 2023-06-08: 1.2 ug/kg/h via INTRAVENOUS
  Administered 2023-06-08: 1.1 ug/kg/h via INTRAVENOUS
  Administered 2023-06-08: 1.2 ug/kg/h via INTRAVENOUS
  Administered 2023-06-08: 1 ug/kg/h via INTRAVENOUS
  Administered 2023-06-09: 1.2 ug/kg/h via INTRAVENOUS
  Administered 2023-06-09: 1 ug/kg/h via INTRAVENOUS
  Administered 2023-06-09: 1.2 ug/kg/h via INTRAVENOUS
  Administered 2023-06-09: 0.8 ug/kg/h via INTRAVENOUS
  Filled 2023-06-02: qty 200
  Filled 2023-06-02 (×3): qty 100
  Filled 2023-06-02: qty 200
  Filled 2023-06-02 (×24): qty 100

## 2023-06-02 MED ORDER — ROCURONIUM BROMIDE 10 MG/ML (PF) SYRINGE
PREFILLED_SYRINGE | INTRAVENOUS | Status: AC
  Filled 2023-06-02: qty 10

## 2023-06-02 MED ORDER — POTASSIUM CHLORIDE 20 MEQ PO PACK
40.0000 meq | PACK | Freq: Two times a day (BID) | ORAL | Status: AC
  Administered 2023-06-02 (×2): 40 meq
  Filled 2023-06-02 (×2): qty 2

## 2023-06-02 MED ORDER — TRAMADOL HCL 50 MG PO TABS
50.0000 mg | ORAL_TABLET | Freq: Four times a day (QID) | ORAL | Status: DC
  Administered 2023-06-02 – 2023-06-06 (×15): 50 mg
  Filled 2023-06-02 (×15): qty 1

## 2023-06-02 MED ORDER — MIDAZOLAM HCL 2 MG/2ML IJ SOLN
INTRAMUSCULAR | Status: AC
  Filled 2023-06-02: qty 2

## 2023-06-02 MED ORDER — METOCLOPRAMIDE HCL 5 MG/5ML PO SOLN
10.0000 mg | Freq: Four times a day (QID) | ORAL | Status: DC
  Administered 2023-06-02 – 2023-06-06 (×15): 10 mg
  Filled 2023-06-02 (×19): qty 10

## 2023-06-02 MED ORDER — FENTANYL BOLUS VIA INFUSION
25.0000 ug | INTRAVENOUS | Status: DC | PRN
  Administered 2023-06-03 (×5): 100 ug via INTRAVENOUS
  Administered 2023-06-03: 50 ug via INTRAVENOUS
  Administered 2023-06-03 (×2): 100 ug via INTRAVENOUS
  Administered 2023-06-04 (×3): 50 ug via INTRAVENOUS
  Administered 2023-06-04 – 2023-06-05 (×9): 100 ug via INTRAVENOUS
  Administered 2023-06-06: 50 ug via INTRAVENOUS
  Administered 2023-06-06 (×2): 100 ug via INTRAVENOUS
  Administered 2023-06-07: 50 ug via INTRAVENOUS
  Administered 2023-06-07 (×2): 25 ug via INTRAVENOUS
  Administered 2023-06-07: 50 ug via INTRAVENOUS
  Administered 2023-06-10 – 2023-06-13 (×7): 100 ug via INTRAVENOUS
  Administered 2023-06-13: 50 ug via INTRAVENOUS
  Administered 2023-06-14: 100 ug via INTRAVENOUS
  Administered 2023-06-14: 50 ug via INTRAVENOUS
  Administered 2023-06-14 (×3): 100 ug via INTRAVENOUS
  Administered 2023-06-14: 50 ug via INTRAVENOUS
  Administered 2023-06-15 – 2023-06-17 (×24): 100 ug via INTRAVENOUS

## 2023-06-02 MED ORDER — FENTANYL CITRATE PF 50 MCG/ML IJ SOSY
PREFILLED_SYRINGE | INTRAMUSCULAR | Status: AC
  Filled 2023-06-02: qty 2

## 2023-06-02 MED ORDER — SUCCINYLCHOLINE CHLORIDE 200 MG/10ML IV SOSY
PREFILLED_SYRINGE | INTRAVENOUS | Status: DC | PRN
  Administered 2023-06-02: 180 mg via INTRAVENOUS

## 2023-06-02 MED ORDER — SUCCINYLCHOLINE CHLORIDE 200 MG/10ML IV SOSY
PREFILLED_SYRINGE | INTRAVENOUS | Status: AC
  Filled 2023-06-02: qty 10

## 2023-06-02 MED ORDER — KETAMINE HCL 50 MG/5ML IJ SOSY
PREFILLED_SYRINGE | INTRAMUSCULAR | Status: AC
  Filled 2023-06-02: qty 10

## 2023-06-02 MED ORDER — CLONAZEPAM 1 MG PO TABS
1.0000 mg | ORAL_TABLET | Freq: Two times a day (BID) | ORAL | Status: DC
  Administered 2023-06-02 – 2023-06-07 (×11): 1 mg
  Filled 2023-06-02 (×12): qty 1

## 2023-06-02 MED ORDER — ENOXAPARIN SODIUM 30 MG/0.3ML IJ SOSY
30.0000 mg | PREFILLED_SYRINGE | Freq: Two times a day (BID) | INTRAMUSCULAR | Status: DC
  Administered 2023-06-02 – 2023-07-04 (×62): 30 mg via SUBCUTANEOUS
  Filled 2023-06-02 (×63): qty 0.3

## 2023-06-02 MED ORDER — SODIUM CHLORIDE 0.9 % IV SOLN
100.0000 mg | Freq: Two times a day (BID) | INTRAVENOUS | Status: DC
  Administered 2023-06-02 – 2023-06-05 (×6): 100 mg via INTRAVENOUS
  Filled 2023-06-02 (×8): qty 10

## 2023-06-02 MED ORDER — ETOMIDATE 2 MG/ML IV SOLN
INTRAVENOUS | Status: AC
  Filled 2023-06-02: qty 20

## 2023-06-02 MED ORDER — MIDAZOLAM HCL 5 MG/5ML IJ SOLN
INTRAMUSCULAR | Status: DC | PRN
  Administered 2023-06-02: 2 mg via INTRAVENOUS

## 2023-06-02 MED ORDER — FENTANYL CITRATE PF 50 MCG/ML IJ SOSY
25.0000 ug | PREFILLED_SYRINGE | Freq: Once | INTRAMUSCULAR | Status: AC
  Administered 2023-06-02 (×2): 50 ug via INTRAVENOUS

## 2023-06-02 MED ORDER — FENTANYL 2500MCG IN NS 250ML (10MCG/ML) PREMIX INFUSION
25.0000 ug/h | INTRAVENOUS | Status: DC
  Administered 2023-06-02: 25 ug/h via INTRAVENOUS
  Administered 2023-06-03: 100 ug/h via INTRAVENOUS
  Administered 2023-06-04: 150 ug/h via INTRAVENOUS
  Administered 2023-06-05: 100 ug/h via INTRAVENOUS
  Administered 2023-06-06 – 2023-06-07 (×3): 150 ug/h via INTRAVENOUS
  Administered 2023-06-07: 50 ug/h via INTRAVENOUS
  Administered 2023-06-08: 175 ug/h via INTRAVENOUS
  Administered 2023-06-08: 150 ug/h via INTRAVENOUS
  Administered 2023-06-09 (×2): 100 ug/h via INTRAVENOUS
  Administered 2023-06-10: 150 ug/h via INTRAVENOUS
  Administered 2023-06-11: 50 ug/h via INTRAVENOUS
  Administered 2023-06-11: 150 ug/h via INTRAVENOUS
  Administered 2023-06-12: 75 ug/h via INTRAVENOUS
  Administered 2023-06-12: 100 ug/h via INTRAVENOUS
  Administered 2023-06-13 (×2): 150 ug/h via INTRAVENOUS
  Administered 2023-06-13 – 2023-06-14 (×2): 125 ug/h via INTRAVENOUS
  Administered 2023-06-14 (×2): 150 ug/h via INTRAVENOUS
  Administered 2023-06-15: 100 ug/h via INTRAVENOUS
  Administered 2023-06-15 – 2023-06-16 (×2): 150 ug/h via INTRAVENOUS
  Administered 2023-06-16 (×2): 200 ug/h via INTRAVENOUS
  Filled 2023-06-02 (×30): qty 250
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
## 2023-06-02 NOTE — Progress Notes (Signed)
Inpatient Rehab Admissions Coordinator:   Per PT recommendations pt was screened for CIR by Estill Dooms, PT, DPT.  Therapy evaluations limited by arousal.  I will follow for next therapy session to rescreen pending improvement.   Estill Dooms, PT, DPT Admissions Coordinator [phone] 06/02/23  12:10 PM
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
## 2023-06-02 NOTE — Progress Notes (Signed)
LTM EEG hooked up and running - no initial skin breakdown - push button tested - [facility] unable to monitor at this time. Reading MD notified.

## 2023-06-02 NOTE — Progress Notes (Signed)
LTM maint complete - no skin breakdown seen Test button tested.  Study is NOT monitored by [facility]- technical difficulties. Serviced A1
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
## 2023-06-02 NOTE — Progress Notes (Signed)
Trauma Event Note  TRN called by Primary RN. Patient with declining respiratory status. RN reports patient agitated approx 1 hour ago. Patient received 50 mcg fentanyl. Patient with heavy secretions. Attempt made to NT suction the patient. Patient with episode of vomiting and possible aspiration. On exam patient with increased respiratory rate between 33-36 BMP. Dr. Freida Busman notified. Verbal order for CXR received.    Last imported Vital Signs BP 131/71   Pulse 67   Temp (!) 100.4 F (38 C) (Axillary)   Resp (!) 25   Ht 5' 7.99" (1.727 m)   Wt 67.5 kg Comment: bedscale  SpO2 97%   BMI 22.63 kg/m   Trending CBC Recent Labs    05/30/23 0550 05/31/23 0452 06/01/23 0500  WBC 8.1 6.2 4.4  HGB 12.1* 11.1* 10.1*  HCT 35.9* 34.0* 30.6*  PLT 254 186 167    Trending Coag's No results for input(s): "APTT", "INR" in the last 72 hours.  Trending BMET Recent Labs    05/30/23 0550 05/31/23 0452 06/01/23 0500  NA 139 139 136  K 3.2* 3.4* 3.2*  CL 108 106 106  CO2 25 25 25   BUN 11 12 15   CREATININE 1.06 1.11 1.09  GLUCOSE 134* 98 148*      Jermarion Poffenberger  Trauma Response RN  Please call TRN at [phone] for further assistance.
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
## 2023-06-02 NOTE — Progress Notes (Signed)
Patient ID: [name], male   DOB: 11-Dec-1955, 67 y.o.   MRN: [id number] Patient developed increased WOB and desat. Staff was able to suction a plug but he remained quite tachypneic on NRB O2 with sats mid 90s. He was intubated by anesthesia. Will check a CXR. I spoke with his brother and son.  Violeta Gelinas, MD, MPH, FACS Please use [email or web address] to contact on call provider

## 2023-06-03 ENCOUNTER — Other Ambulatory Visit (HOSPITAL_COMMUNITY): Payer: Medicare Other

## 2023-06-03 ENCOUNTER — Inpatient Hospital Stay (HOSPITAL_COMMUNITY): Payer: Medicare Other

## 2023-06-03 DIAGNOSIS — G40901 Epilepsy, unspecified, not intractable, with status epilepticus: Secondary | ICD-10-CM | POA: Diagnosis not present

## 2023-06-03 LAB — CBC
HCT: 33.2 % — ABNORMAL LOW (ref 39.0–52.0)
Hemoglobin: 10.8 g/dL — ABNORMAL LOW (ref 13.0–17.0)
MCH: 28.5 pg (ref 26.0–34.0)
MCHC: 32.5 g/dL (ref 30.0–36.0)
MCV: 87.6 fL (ref 80.0–100.0)
Platelets: 254 10*3/uL (ref 150–400)
RBC: 3.79 MIL/uL — ABNORMAL LOW (ref 4.22–5.81)
RDW: 12.8 % (ref 11.5–15.5)
WBC: 7.9 10*3/uL (ref 4.0–10.5)
nRBC: 0 % (ref 0.0–0.2)

## 2023-06-03 LAB — GLUCOSE, CAPILLARY
Glucose-Capillary: 108 mg/dL — ABNORMAL HIGH (ref 70–99)
Glucose-Capillary: 109 mg/dL — ABNORMAL HIGH (ref 70–99)
Glucose-Capillary: 114 mg/dL — ABNORMAL HIGH (ref 70–99)
Glucose-Capillary: 126 mg/dL — ABNORMAL HIGH (ref 70–99)
Glucose-Capillary: 129 mg/dL — ABNORMAL HIGH (ref 70–99)
Glucose-Capillary: 89 mg/dL (ref 70–99)

## 2023-06-03 LAB — BASIC METABOLIC PANEL
Anion gap: 11 (ref 5–15)
BUN: 14 mg/dL (ref 8–23)
CO2: 25 mmol/L (ref 22–32)
Calcium: 8 mg/dL — ABNORMAL LOW (ref 8.9–10.3)
Chloride: 105 mmol/L (ref 98–111)
Creatinine, Ser: 0.74 mg/dL (ref 0.61–1.24)
GFR, Estimated: >60 mL/min (ref >60)
Glucose, Bld: 133 mg/dL — ABNORMAL HIGH (ref 70–99)
Potassium: 3.4 mmol/L — ABNORMAL LOW (ref 3.5–5.1)
Sodium: 141 mmol/L (ref 135–145)

## 2023-06-03 MED ORDER — QUETIAPINE FUMARATE 25 MG PO TABS
50.0000 mg | ORAL_TABLET | Freq: Two times a day (BID) | ORAL | Status: DC
  Administered 2023-06-03 – 2023-06-05 (×5): 50 mg
  Filled 2023-06-03 (×6): qty 2

## 2023-06-03 MED ORDER — MIDAZOLAM HCL 2 MG/2ML IJ SOLN
4.0000 mg | INTRAMUSCULAR | Status: AC | PRN
  Administered 2023-06-03: 2 mg via INTRAVENOUS
  Administered 2023-06-03 (×2): 4 mg via INTRAVENOUS
  Filled 2023-06-03 (×2): qty 4

## 2023-06-03 MED ORDER — SENNA 8.6 MG PO TABS
2.0000 | ORAL_TABLET | Freq: Once | ORAL | Status: AC
  Administered 2023-06-03: 17.2 mg
  Filled 2023-06-03: qty 2

## 2023-06-03 MED ORDER — MAGNESIUM HYDROXIDE 400 MG/5ML PO SUSP
30.0000 mL | Freq: Once | ORAL | Status: AC
  Administered 2023-06-03: 30 mL
  Filled 2023-06-03: qty 30

## 2023-06-03 MED ORDER — POLYETHYLENE GLYCOL 3350 17 G PO PACK
17.0000 g | PACK | Freq: Two times a day (BID) | ORAL | Status: DC
  Administered 2023-06-04 – 2023-06-06 (×4): 17 g
  Filled 2023-06-03 (×6): qty 1

## 2023-06-03 MED ORDER — POTASSIUM CHLORIDE 20 MEQ PO PACK
40.0000 meq | PACK | Freq: Two times a day (BID) | ORAL | Status: AC
  Administered 2023-06-03 (×2): 40 meq
  Filled 2023-06-03 (×2): qty 2

## 2023-06-03 MED ORDER — OXYCODONE HCL 5 MG PO TABS
10.0000 mg | ORAL_TABLET | Freq: Four times a day (QID) | ORAL | Status: DC
  Administered 2023-06-03 – 2023-06-06 (×11): 10 mg
  Filled 2023-06-03 (×12): qty 2

## 2023-06-03 MED ORDER — POTASSIUM CHLORIDE 20 MEQ PO PACK
40.0000 meq | PACK | Freq: Two times a day (BID) | ORAL | Status: DC
  Filled 2023-06-03: qty 2

## 2023-06-03 MED ORDER — VALPROIC ACID 250 MG/5ML PO SOLN
500.0000 mg | Freq: Two times a day (BID) | ORAL | Status: DC
  Administered 2023-06-03 – 2023-06-05 (×5): 500 mg
  Filled 2023-06-03 (×7): qty 10

## 2023-06-03 MED ORDER — BISACODYL 5 MG PO TBEC: 10.0000 mg | DELAYED_RELEASE_TABLET | Freq: Once | ORAL | Status: DC

## 2023-06-03 MED ORDER — ORAL CARE MOUTH RINSE: 15.0000 mL | OROMUCOSAL | Status: DC | PRN

## 2023-06-03 MED ORDER — ORAL CARE MOUTH RINSE
15.0000 mL | OROMUCOSAL | Status: DC
  Administered 2023-06-03 – 2023-06-20 (×201): 15 mL via OROMUCOSAL

## 2023-06-03 MED ORDER — MAGNESIUM CITRATE PO SOLN
1.0000 | Freq: Once | ORAL | Status: AC
  Administered 2023-06-03: 1
  Filled 2023-06-03: qty 296

## 2023-06-03 MED ORDER — ROCURONIUM BROMIDE 10 MG/ML (PF) SYRINGE
100.0000 mg | PREFILLED_SYRINGE | Freq: Once | INTRAVENOUS | Status: DC
  Filled 2023-06-03: qty 10

## 2023-06-03 MED ORDER — BISACODYL 10 MG RE SUPP
10.0000 mg | Freq: Once | RECTAL | Status: AC
  Administered 2023-06-03: 10 mg via RECTAL
  Filled 2023-06-03: qty 1

## 2023-06-03 MED ORDER — ORAL CARE MOUTH RINSE
15.0000 mL | OROMUCOSAL | Status: DC
  Administered 2023-06-03 (×5): 15 mL via OROMUCOSAL

## 2023-06-03 NOTE — Progress Notes (Signed)
Pt transported from 4N15 to MRI and back by RN and RT w/o complications

## 2023-06-03 NOTE — Progress Notes (Signed)
Pt has removed all leads, per Dr. Melynda Ripple okay not to reapply at this time.  LTM EEG discontinued - no skin breakdown at [facility].
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
## 2023-06-03 NOTE — Progress Notes (Signed)
No new issues or problems.  Patient remains intubated.  Recently given sedation secondary to agitation.  Afebrile.  Vital signs are stable.  He is sedated but will awaken to noxious stimuli.  Appears aware.  He follows some simple commands bilaterally.  No witnessed seizure activity.  EEG without obvious seizure activity.  MRI scan pending.  Overall progressing slowly.  Will remain sedated until after the MRI scan at which point hopefully we can again work towards extubation.  No new recommendations from our standpoint.
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
## 2023-06-03 NOTE — Plan of Care (Signed)
  Problem: Safety: Goal: Non-violent Restraint(s) Outcome: Not Progressing   Problem: Education: Goal: Knowledge of General Education information will improve Description: Including pain rating scale, medication(s)/side effects and non-pharmacologic comfort measures Outcome: Not Progressing   Problem: Health Behavior/Discharge Planning: Goal: Ability to manage health-related needs will improve Outcome: Not Progressing   Problem: Clinical Measurements: Goal: Ability to maintain clinical measurements within normal limits will improve Outcome: Not Progressing Goal: Will remain free from infection Outcome: Not Progressing Goal: Diagnostic test results will improve Outcome: Not Progressing Goal: Respiratory complications will improve Outcome: Not Progressing Goal: Cardiovascular complication will be avoided Outcome: Not Progressing
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
## 2023-06-04 ENCOUNTER — Inpatient Hospital Stay (HOSPITAL_COMMUNITY): Payer: Medicare Other

## 2023-06-04 LAB — BASIC METABOLIC PANEL
Anion gap: 5 (ref 5–15)
BUN: 19 mg/dL (ref 8–23)
CO2: 26 mmol/L (ref 22–32)
Calcium: 8.1 mg/dL — ABNORMAL LOW (ref 8.9–10.3)
Chloride: 111 mmol/L (ref 98–111)
Creatinine, Ser: 1.22 mg/dL (ref 0.61–1.24)
GFR, Estimated: >60 mL/min (ref >60)
Glucose, Bld: 130 mg/dL — ABNORMAL HIGH (ref 70–99)
Potassium: 3.8 mmol/L (ref 3.5–5.1)
Sodium: 142 mmol/L (ref 135–145)

## 2023-06-04 LAB — CBC
HCT: 28 % — ABNORMAL LOW (ref 39.0–52.0)
Hemoglobin: 9.1 g/dL — ABNORMAL LOW (ref 13.0–17.0)
MCH: 29.2 pg (ref 26.0–34.0)
MCHC: 32.5 g/dL (ref 30.0–36.0)
MCV: 89.7 fL (ref 80.0–100.0)
Platelets: 201 10*3/uL (ref 150–400)
RBC: 3.12 MIL/uL — ABNORMAL LOW (ref 4.22–5.81)
RDW: 12.9 % (ref 11.5–15.5)
WBC: 4.4 10*3/uL (ref 4.0–10.5)
nRBC: 0 % (ref 0.0–0.2)

## 2023-06-04 LAB — GLUCOSE, CAPILLARY
Glucose-Capillary: 121 mg/dL — ABNORMAL HIGH (ref 70–99)
Glucose-Capillary: 158 mg/dL — ABNORMAL HIGH (ref 70–99)
Glucose-Capillary: 122 mg/dL — ABNORMAL HIGH (ref 70–99)
Glucose-Capillary: 136 mg/dL — ABNORMAL HIGH (ref 70–99)
Glucose-Capillary: 123 mg/dL — ABNORMAL HIGH (ref 70–99)
Glucose-Capillary: 139 mg/dL — ABNORMAL HIGH (ref 70–99)

## 2023-06-04 MED ORDER — ACETAMINOPHEN 160 MG/5ML PO SOLN
1000.0000 mg | Freq: Four times a day (QID) | ORAL | Status: DC
  Administered 2023-06-04 – 2023-06-13 (×35): 1000 mg
  Filled 2023-06-04 (×36): qty 40.6

## 2023-06-04 MED ORDER — SODIUM CHLORIDE 0.9 % IV BOLUS
500.0000 mL | Freq: Once | INTRAVENOUS | Status: AC
  Administered 2023-06-04: 500 mL via INTRAVENOUS

## 2023-06-04 MED ORDER — FREE WATER
200.0000 mL | Status: DC
  Administered 2023-06-04 – 2023-06-14 (×47): 200 mL

## 2023-06-04 MED ORDER — ACETAMINOPHEN 160 MG/5ML PO SOLN
650.0000 mg | Freq: Four times a day (QID) | ORAL | Status: DC | PRN
  Filled 2023-06-04: qty 20.3

## 2023-06-04 MED ORDER — CALCIUM GLUCONATE-NACL 2-0.675 GM/100ML-% IV SOLN
2.0000 g | Freq: Once | INTRAVENOUS | Status: AC
  Administered 2023-06-04: 2000 mg via INTRAVENOUS
  Filled 2023-06-04: qty 100

## 2023-06-04 NOTE — Progress Notes (Signed)
No new issues or problems.  Patient remains intubated and sedated.  Afebrile.  Vital signs are stable.  Opens eyes to noxious stimuli.  Pupils equal bilaterally.  Purposeful movements bilaterally.  MRI scan demonstrates multifocal contusions with some traumatic subarachnoid hemorrhage.  No evidence of major structural injury otherwise.  Status post significant traumatic brain injury with likely intermittent worsening secondary to seizures.  Continue antiepileptic medications.  Work towards extubation.

## 2023-06-04 NOTE — Progress Notes (Signed)
Patient became hypotensive at 0100 with systolic pressure in the 70s and 80s, however, with appropriate or borderline MAPs. Pressure was rechecked, and sedation medications were decreased and/or turned off. Patient remained hypotensive. Trauma MD paged and orders for 500cc IV fluid bolus placed as well as an IVPB of calcium gluconate, and both were administered. SBP increased to the high 80s, low 90s.   Trauma MD paged again at 0400 and another 500cc IV fluid bolus was ordered and given.   Trauma MD verbalized blood pressure parameters to be modified to SBP >85 and MAP >65.   Patient also not voiding spontaneously. Patient was I+O catheterized just prior to shift change at 1900, with 425cc out.   Patient was bladder scanned throughout the night and I+O catheterized again at 0500 with 250cc output of dark amber urine.
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
## 2023-06-05 ENCOUNTER — Inpatient Hospital Stay (HOSPITAL_COMMUNITY): Payer: Medicare Other

## 2023-06-05 DIAGNOSIS — G40901 Epilepsy, unspecified, not intractable, with status epilepticus: Secondary | ICD-10-CM | POA: Diagnosis not present

## 2023-06-05 LAB — BASIC METABOLIC PANEL
Anion gap: 7 (ref 5–15)
BUN: 17 mg/dL (ref 8–23)
CO2: 27 mmol/L (ref 22–32)
Calcium: 7.9 mg/dL — ABNORMAL LOW (ref 8.9–10.3)
Chloride: 107 mmol/L (ref 98–111)
Creatinine, Ser: 0.91 mg/dL (ref 0.61–1.24)
GFR, Estimated: >60 mL/min (ref >60)
Glucose, Bld: 125 mg/dL — ABNORMAL HIGH (ref 70–99)
Potassium: 3.7 mmol/L (ref 3.5–5.1)
Sodium: 141 mmol/L (ref 135–145)

## 2023-06-05 LAB — GLUCOSE, CAPILLARY
Glucose-Capillary: 147 mg/dL — ABNORMAL HIGH (ref 70–99)
Glucose-Capillary: 106 mg/dL — ABNORMAL HIGH (ref 70–99)
Glucose-Capillary: 101 mg/dL — ABNORMAL HIGH (ref 70–99)
Glucose-Capillary: 91 mg/dL (ref 70–99)
Glucose-Capillary: 142 mg/dL — ABNORMAL HIGH (ref 70–99)
Glucose-Capillary: 128 mg/dL — ABNORMAL HIGH (ref 70–99)

## 2023-06-05 LAB — CBC
HCT: 29.1 % — ABNORMAL LOW (ref 39.0–52.0)
Hemoglobin: 9.1 g/dL — ABNORMAL LOW (ref 13.0–17.0)
MCH: 28.1 pg (ref 26.0–34.0)
MCHC: 31.3 g/dL (ref 30.0–36.0)
MCV: 89.8 fL (ref 80.0–100.0)
Platelets: 248 10*3/uL (ref 150–400)
RBC: 3.24 MIL/uL — ABNORMAL LOW (ref 4.22–5.81)
RDW: 13.1 % (ref 11.5–15.5)
WBC: 4.7 10*3/uL (ref 4.0–10.5)
nRBC: 0 % (ref 0.0–0.2)

## 2023-06-05 MED ORDER — PIVOT 1.5 CAL PO LIQD
1000.0000 mL | ORAL | Status: DC
  Administered 2023-06-05 – 2023-06-07 (×2): 1000 mL

## 2023-06-05 MED ORDER — ROCURONIUM BROMIDE 10 MG/ML (PF) SYRINGE: 50.0000 mg | PREFILLED_SYRINGE | Freq: Once | INTRAVENOUS | Status: DC

## 2023-06-05 MED ORDER — SODIUM CHLORIDE 0.9 % IV SOLN: INTRAVENOUS | Status: AC
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
## 2023-06-05 NOTE — Progress Notes (Signed)
Date and time results received: 06/05/23 0812 (use smartphrase ".now" to insert current time)  Test: CXR Critical Value: Interval worsened, near circumferential right pneumothorax, estimated 40-50% of the right chest volume, with minimal shift of the midline.   Name of Provider Notified: Lovick  Orders Received? Or Actions Taken?: CT insertion tray at bedside

## 2023-06-05 NOTE — Progress Notes (Signed)
Upon assessment, pt was coughing up what appeared to be tube feeds. Tube feeds immediately turned off, zofran given, Trauma MD notified. OG hooked up to low-intermittent suction per Azucena Cecil, MD. At this time, 400 mL output from the OG. Will continue to monitor and assess the pt and notify of any additional changes.

## 2023-06-05 NOTE — Procedures (Signed)
Cortrak  Person Inserting Tube:  Kendell Bane C, RD Tube Type:  Cortrak - 43 inches Tube Size:  10 Tube Location:  Left nare Secured by: Bridle Technique Used to Measure Tube Placement:  Marking at nare/corner of mouth Cortrak Secured At:  81 cm   Cortrak Tube Team Note:  Consult received to place a Cortrak feeding tube.  OG tube for suction present during placement  X-ray is required, abdominal x-ray has been ordered by the Cortrak team. Please confirm tube placement before using the Cortrak tube.   If the tube becomes dislodged please keep the tube and contact the Cortrak team at [email or web address] for replacement.  If after hours and replacement cannot be delayed, place a NG tube and confirm placement with an abdominal x-ray.    Cammy Copa., RD, LDN, CNSC See AMiON for contact information

## 2023-06-05 NOTE — Progress Notes (Signed)
SLP Cancellation Note  Patient Details Name: MARCIS PETTEYS MRN: [id number] DOB: 12-09-1955   Cancelled treatment:       Reason Eval/Treat Not Completed: Medical issues which prohibited therapy (on vent). SLP will continue following.    Gwynneth Aliment, M.A., CF-SLP Speech Language Pathology, Acute Rehabilitation Services  Secure Chat preferred [phone]  06/05/2023, 9:16 AM

## 2023-06-05 NOTE — Progress Notes (Signed)
PT Cancellation Note  Patient Details Name: [name] MRN: [id number] DOB: 1956-03-24   Cancelled Treatment:    Reason Eval/Treat Not Completed: Medical issues which prohibited therapy;Patient not medically ready - pt re-intubated at this time, will check back.   Marye Round, PT DPT Acute Rehabilitation Services Secure Chat Preferred  Office [phone]   Truddie Coco 06/05/2023, 12:56 PM
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
## 2023-06-06 ENCOUNTER — Inpatient Hospital Stay (HOSPITAL_COMMUNITY): Payer: Medicare Other

## 2023-06-06 LAB — GLUCOSE, CAPILLARY
Glucose-Capillary: 160 mg/dL — ABNORMAL HIGH (ref 70–99)
Glucose-Capillary: 121 mg/dL — ABNORMAL HIGH (ref 70–99)
Glucose-Capillary: 95 mg/dL (ref 70–99)
Glucose-Capillary: 120 mg/dL — ABNORMAL HIGH (ref 70–99)
Glucose-Capillary: 111 mg/dL — ABNORMAL HIGH (ref 70–99)
Glucose-Capillary: 94 mg/dL (ref 70–99)

## 2023-06-06 MED ORDER — QUETIAPINE FUMARATE 100 MG PO TABS
100.0000 mg | ORAL_TABLET | Freq: Two times a day (BID) | ORAL | Status: DC
  Administered 2023-06-06 – 2023-06-07 (×4): 100 mg
  Filled 2023-06-06 (×4): qty 1

## 2023-06-06 MED ORDER — IOHEXOL 300 MG/ML  SOLN
50.0000 mL | Freq: Once | INTRAMUSCULAR | Status: AC | PRN
  Administered 2023-06-06: 50 mL

## 2023-06-06 MED ORDER — VALPROIC ACID 250 MG/5ML PO SOLN
500.0000 mg | Freq: Three times a day (TID) | ORAL | Status: DC
  Administered 2023-06-06 – 2023-06-07 (×3): 500 mg
  Filled 2023-06-06 (×3): qty 10

## 2023-06-06 MED ORDER — OXYCODONE HCL 5 MG PO TABS
10.0000 mg | ORAL_TABLET | ORAL | Status: DC
  Administered 2023-06-06 – 2023-06-21 (×90): 10 mg
  Filled 2023-06-06 (×90): qty 2

## 2023-06-06 NOTE — Progress Notes (Signed)
PT Cancellation Note  Patient Details Name: JAHMEIR HUSTER MRN: [id number] DOB: 1956-03-05   Cancelled Treatment:    Reason Eval/Treat Not Completed: Medical issues which prohibited therapy; patient vented and sedated.  Will attempt another day.   Elray Mcgregor 06/06/2023, 10:14 AM Sheran Lawless, PT Acute Rehabilitation Services [phone] 06/06/2023

## 2023-06-06 NOTE — Progress Notes (Signed)
Upon pt re-assessment, subcutaneous air was assessed above the pt's chest tube. TRN and Trauma MD notified. Chest xray ordered. TRN and Hillery Hunter, MD at bedside. Additional chest tube was placed for a growing pneumothorax. Consent obtained, time out performed. Will continue to monitor and assess the pt and notify of any changes.   Pt had episode of emesis. Tube feeds held. TRN and Trauma MD notified. PRN zofran given. No new orders at this time. Will continue to monitor and assess the pt and notify of any changes.

## 2023-06-06 NOTE — Progress Notes (Signed)
Cortrak Tube Team Note:  Request received to bridle a Cortrak feeding tube that had been advanced by radiology. RD successfully bridled Cortrak tube at 98 cm in the left nare.   Mertie Clause, MS, RD, LDN Registered Dietitian II Please see AMiON for contact information.

## 2023-06-06 NOTE — Progress Notes (Signed)
Subjective: Patient reports intubated and sedated  Objective: Vital signs in last 24 hours: Temp:  [97.7 F (36.5 C)-101.8 F (38.8 C)] 97.7 F (36.5 C) (11/19 0400) Pulse Rate:  [48-122] 55 (11/19 0730) Resp:  [14-22] 14 (11/19 0730) BP: (86-189)/(56-98) 100/64 (11/19 0730) SpO2:  [95 %-100 %] 99 % (11/19 0730) FiO2 (%):  [40 %] 40 % (11/19 0724) Weight:  [68 kg] 68 kg (11/19 0500)  Intake/Output from previous day: 11/18 0701 - 11/19 0700 In: 2063.4 [I.V.:1577.1; NG/GT:451.3; IV Piggyback:35] Out: 1460 [Urine:1060; Emesis/NG output:300; Chest Tube:100] Intake/Output this shift:   Lab Results: Lab Results  Component Value Date   WBC 4.7 06/05/2023   HGB 9.1 (L) 06/05/2023   HCT 29.1 (L) 06/05/2023   MCV 89.8 06/05/2023   PLT 248 06/05/2023   Lab Results  Component Value Date   INR 1.0 02/09/2021   BMET Lab Results  Component Value Date   NA 141 06/05/2023   K 3.7 06/05/2023   CL 107 06/05/2023   CO2 27 06/05/2023   GLUCOSE 125 (H) 06/05/2023   BUN 17 06/05/2023   CREATININE 0.91 06/05/2023   CALCIUM 7.9 (L) 06/05/2023    Studies/Results: DG Chest Portable 1 View  Result Date: 06/06/2023 CLINICAL DATA:  Follow-up pneumothorax. EXAM: PORTABLE CHEST 1 VIEW COMPARISON:  06/06/2023 FINDINGS: The endotracheal tube tip is above the carina. Right IJ catheter tip is at the cavoatrial junction. Nasogastric and feeding tubes are in place both the which course below the level of the GE junction. There are 2 right-sided chest tubes in place. The right-sided pneumothorax is slightly decreased in volume compared with the previous exam. Small bilateral pleural effusions are again noted. Atelectasis and airspace disease within the left mid and left lower lung appear unchanged. IMPRESSION: 1. Slight decrease in volume of right-sided pneumothorax. 2. Stable small bilateral pleural effusions. 3. Stable left mid and left lower lung atelectasis and airspace disease. These results will  be called to the ordering clinician or representative by the Radiologist Assistant, and communication documented in the PACS or [employer]. Electronically Signed   By: Signa Kell M.D.   On: 06/06/2023 05:41   DG Chest Portable 1 View  Result Date: 06/06/2023 CLINICAL DATA:  Evaluate subcutaneous emphysema EXAM: PORTABLE CHEST 1 VIEW COMPARISON:  Film from the previous day. FINDINGS: Melene Muller is stable. Gastric catheter and feeding catheter are noted within the stomach. Endotracheal tube is seen in satisfactory position. Right jugular central line is again noted. Right chest tube is noted in place although recurrent pneumothorax is noted on the right with a proximally 1 cm excursion laterally. Increasing subcutaneous emphysema on the right is noted. Persistent left basilar airspace opacity is noted. IMPRESSION: Recurrent right pneumothorax with increased subcutaneous emphysema. These results will be called to the ordering clinician or representative by the Radiologist Assistant, and communication documented in the PACS or [employer]. Electronically Signed   By: Alcide Clever M.D.   On: 06/06/2023 03:43   DG Chest Port 1 View  Result Date: 06/05/2023 CLINICAL DATA:  Respiratory failure. EXAM: PORTABLE CHEST 1 VIEW COMPARISON:  Same day. FINDINGS: Interval placement of feeding tube seen entering stomach. Right internal jugular catheter is unchanged. Endotracheal tube is unchanged. Right-sided chest tube is noted with minimal right apical pneumothorax. Stable bilateral lung opacities are noted, left greater than right. Small left pleural effusion. IMPRESSION: Interval placement of feeding tube. Right-sided chest tube is again noted with minimal right apical pneumothorax which is significantly improved compared  to prior exam. Stable bilateral lung opacities. Electronically Signed   By: Lupita Raider M.D.   On: 06/05/2023 12:18   DG Abd Portable 1V  Result Date: 06/05/2023 CLINICAL DATA:   Feeding tube placement. EXAM: PORTABLE ABDOMEN - 1 VIEW COMPARISON:  Same day. FINDINGS: Distal tip of feeding tube is seen in expected position of distal stomach. Distal tip of nasogastric tube is seen in proximal stomach. IMPRESSION: Distal tip of feeding tube seen in expected position of distal stomach. Electronically Signed   By: Lupita Raider M.D.   On: 06/05/2023 12:16   DG Chest Port 1 View  Addendum Date: 06/05/2023   ADDENDUM REPORT: 06/05/2023 08:16 ADDENDUM: Critical Value/emergent results were called by telephone at the time of interpretation on 06/05/2023 at 8:11 am to provider [facility], who verbally acknowledged these results. Electronically Signed   By: Almira Bar M.D.   On: 06/05/2023 08:16   Result Date: 06/05/2023 CLINICAL DATA:  [phone] with respiratory failure, ETT, chest tube in place. [id number] with emesis. EXAM: PORTABLE ABDOMEN - 1 VIEW; PORTABLE CHEST - 1 VIEW COMPARISON:  Portable chest 06/04/2023, portable abdomen film 06/03/2023, and CT chest, abdomen and pelvis with contrast 05/29/2023. FINDINGS: Chest AP portable 5:26 a.m.: Interval worsened, near circumferential right pneumothorax, previously was 1 cm pleural separation now 3.5 cm, estimated 40-50% of the right chest volume. There is a slight shift of the mediastinum to the left compatible with a minor component of tension. There is continued left lower zonal airspace disease and underlying small left pleural effusion, increased perihilar linear atelectasis on the right. There is cardiomegaly with mild central vascular prominence. Stable mediastinum. Left IJ line tip again projects at the brachiocephalic/SVC junction, right IJ line tip in the mid SVC. NGT curves to the left in the stomach terminating over the proximal fundus, ETT tip is 5.5 cm from the carina. Cervical fusion hardware is again noted with no new osseous findings. Flat plate portable abdomen exam 5:33 a.m.: The bowel pattern is nonobstructive. There is  nondilated aeration of the large intestine through to the rectum. No radiopaque urinary stones or other significant radiographic findings. There is no supine evidence of free air. Advanced degenerative change lower lumbar spine. IMPRESSION: 1. Interval worsened, near circumferential right pneumothorax, estimated 40-50% of the right chest volume, with minimal shift of the midline. 2. Continued left lower zonal airspace disease and underlying small left pleural effusion. 3. Cardiomegaly with mild central vascular prominence. 4. Nonobstructive bowel pattern. 5. Support apparatus as above. 6. PRA is attempting to reach the ordering provider for stat notification at the time of signing. This report will be addended when contact has been made. Electronically Signed: By: Almira Bar M.D. On: 06/05/2023 07:45   DG Abd Portable 1V  Addendum Date: 06/05/2023   ADDENDUM REPORT: 06/05/2023 08:16 ADDENDUM: Critical Value/emergent results were called by telephone at the time of interpretation on 06/05/2023 at 8:11 am to provider [facility], who verbally acknowledged these results. Electronically Signed   By: Almira Bar M.D.   On: 06/05/2023 08:16   Result Date: 06/05/2023 CLINICAL DATA:  [phone] with respiratory failure, ETT, chest tube in place. [id number] with emesis. EXAM: PORTABLE ABDOMEN - 1 VIEW; PORTABLE CHEST - 1 VIEW COMPARISON:  Portable chest 06/04/2023, portable abdomen film 06/03/2023, and CT chest, abdomen and pelvis with contrast 05/29/2023. FINDINGS: Chest AP portable 5:26 a.m.: Interval worsened, near circumferential right pneumothorax, previously was 1 cm pleural separation now 3.5 cm, estimated  40-50% of the right chest volume. There is a slight shift of the mediastinum to the left compatible with a minor component of tension. There is continued left lower zonal airspace disease and underlying small left pleural effusion, increased perihilar linear atelectasis on the right. There is cardiomegaly  with mild central vascular prominence. Stable mediastinum. Left IJ line tip again projects at the brachiocephalic/SVC junction, right IJ line tip in the mid SVC. NGT curves to the left in the stomach terminating over the proximal fundus, ETT tip is 5.5 cm from the carina. Cervical fusion hardware is again noted with no new osseous findings. Flat plate portable abdomen exam 5:33 a.m.: The bowel pattern is nonobstructive. There is nondilated aeration of the large intestine through to the rectum. No radiopaque urinary stones or other significant radiographic findings. There is no supine evidence of free air. Advanced degenerative change lower lumbar spine. IMPRESSION: 1. Interval worsened, near circumferential right pneumothorax, estimated 40-50% of the right chest volume, with minimal shift of the midline. 2. Continued left lower zonal airspace disease and underlying small left pleural effusion. 3. Cardiomegaly with mild central vascular prominence. 4. Nonobstructive bowel pattern. 5. Support apparatus as above. 6. PRA is attempting to reach the ordering provider for stat notification at the time of signing. This report will be addended when contact has been made. Electronically Signed: By: Almira Bar M.D. On: 06/05/2023 07:45   DG Chest Port 1 View  Result Date: 06/04/2023 CLINICAL DATA:  Respiratory failure EXAM: PORTABLE CHEST 1 VIEW COMPARISON:  06/02/2023. FINDINGS: Left-sided pleural effusion. Left mid to lower lung consolidation consistent with pneumonia or volume loss. Normal pulmonary vasculature. Right apical pneumothorax identified with a 1 cm pleural separation. Left IJ CVC proximal SVC. Endotracheal tube tip just below thoracic inlet. Right-sided chest tube. Right IJ CVC tip mid SVC. NG tube tip below diaphragm superimposed with stomach. IMPRESSION: 1. Right-sided chest tube with a small right apical pneumothorax. 2. Left-sided effusion and consolidation. Findings were communicated with the  patient's RN, Sherron Ales, by secure [employer]. Electronically Signed   By: Layla Maw M.D.   On: 06/04/2023 08:34    Assessment/Plan: S/p CHI with seizures. Seems his biggest issue right now is pulm. Continue supportive care. No new nsgy recom.   LOS: 8 days    [name] 06/06/2023, 8:10 AM
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
## 2023-06-06 NOTE — Procedures (Signed)
   Procedure Note  Date: 06/06/2023  Procedure: tube thoracostomy--right    Pre-op diagnosis: right pneumothorax  Post-op diagnosis: same  Surgeon: Melody Haver, MD  Anesthesia: local   EBL: <5cc procedural Drains/Implants: 14 F chest tube Specimen: none  Description of procedure: Time-out was performed verifying correct patient, procedure, site, laterality, and signature of informed consent from the patient's mother. 3 cc's of local anesthetic was infiltrated into the tissues just over the fourth intercostal space.  Pigtail: A small skin nick was made at the fourth intercostal space. An introducer needle was inserted and a guidewire inserted through the needle. The needle was removed and the tract dilated. The chest tube was inserted over the guidewire and the guidewire removed.   The tube was secured at the skin with suture and connected to an atrium at -20cm water wall suction. The site was dressed with xeroform, gauze, and tape. The patient tolerated the procedure well. There were no complications. Follow up chest x-ray was ordered to confirm tube positioning, complete evacuation, and complete lung re-expansion.    Melody Haver, MD General Surgery [facility]

## 2023-06-06 NOTE — Progress Notes (Signed)
RT assisted with patient transport on vent to IR and returned to 4N15 without complications.

## 2023-06-06 NOTE — Progress Notes (Signed)
OT Cancellation Note  Patient Details Name: [name] MRN: [id number] DOB: 01/21/1956   Cancelled Treatment:    Reason Eval/Treat Not Completed: Patient's level of consciousness;Patient not medically ready (sedated and pending IR trip with RN at this time)  [name] 06/06/2023, 9:54 AM

## 2023-06-06 NOTE — Progress Notes (Signed)
SLP Cancellation Note  Patient Details Name: [name] MRN: [id number] DOB: [birth year]   Cancelled treatment:       Reason Eval/Treat Not Completed: Medical issues which prohibited therapy; intubated and sedated. Will continue to follow.   Gwynneth Aliment, M.A., CF-SLP Speech Language Pathology, Acute Rehabilitation Services  Secure Chat preferred [phone]  06/06/2023, 10:15 AM

## 2023-06-07 ENCOUNTER — Inpatient Hospital Stay (HOSPITAL_COMMUNITY): Payer: Medicare Other

## 2023-06-07 LAB — URINALYSIS, ROUTINE W REFLEX MICROSCOPIC
Bilirubin Urine: NEGATIVE
Glucose, UA: NEGATIVE mg/dL
Ketones, ur: NEGATIVE mg/dL
Leukocytes,Ua: NEGATIVE
Nitrite: NEGATIVE
Protein, ur: 30 mg/dL — AB
Specific Gravity, Urine: 1.032 — ABNORMAL HIGH (ref 1.005–1.030)
pH: 5 (ref 5.0–8.0)

## 2023-06-07 LAB — PHOSPHORUS: Phosphorus: 3.2 mg/dL (ref 2.5–4.6)

## 2023-06-07 LAB — AMMONIA: Ammonia: 29 umol/L (ref 9–35)

## 2023-06-07 LAB — GLUCOSE, CAPILLARY
Glucose-Capillary: 97 mg/dL (ref 70–99)
Glucose-Capillary: 126 mg/dL — ABNORMAL HIGH (ref 70–99)
Glucose-Capillary: 113 mg/dL — ABNORMAL HIGH (ref 70–99)
Glucose-Capillary: 136 mg/dL — ABNORMAL HIGH (ref 70–99)
Glucose-Capillary: 159 mg/dL — ABNORMAL HIGH (ref 70–99)
Glucose-Capillary: 175 mg/dL — ABNORMAL HIGH (ref 70–99)

## 2023-06-07 LAB — MAGNESIUM: Magnesium: 2.3 mg/dL (ref 1.7–2.4)

## 2023-06-07 MED ORDER — PIVOT 1.5 CAL PO LIQD
1000.0000 mL | ORAL | Status: DC
  Administered 2023-06-07 – 2023-07-02 (×23): 1000 mL

## 2023-06-07 MED ORDER — VALPROATE SODIUM 100 MG/ML IV SOLN
500.0000 mg | Freq: Three times a day (TID) | INTRAVENOUS | Status: DC
  Administered 2023-06-07 – 2023-06-08 (×3): 500 mg via INTRAVENOUS
  Filled 2023-06-07 (×3): qty 500
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
## 2023-06-07 NOTE — Progress Notes (Signed)
PT Cancellation Note  Patient Details Name: [name] MRN: [id number] DOB: 1955/07/24   Cancelled Treatment:    Reason Eval/Treat Not Completed: Medical issues which prohibited therapy; patient remains either sedated or very agitated.  Third cancel so will sign off per protocol and await new orders when stable for mobility.   Elray Mcgregor 06/07/2023, 11:50 AM Sheran Lawless, PT Acute Rehabilitation Services Office:[phone] 06/07/2023

## 2023-06-07 NOTE — Progress Notes (Signed)
SLP Cancellation Note  Patient Details Name: DONLD LUEBBE MRN: [id number] DOB: [birth year]   Cancelled treatment:       Reason Eval/Treat Not Completed: Patient not medically ready. Will continue following.    Gwynneth Aliment, M.A., CF-SLP Speech Language Pathology, Acute Rehabilitation Services  Secure Chat preferred [phone]  06/07/2023, 9:07 AM

## 2023-06-07 NOTE — Progress Notes (Signed)
Cortrak Tube Team Note:  Consult received to reposition a Cortrak feeding tube. Tube had appeared kinked on x-ray and RN unable to infuse tube feeds.  RD repositioned Cortrak tube and secured with existing bridle at 90 cm in the left nare.  X-ray is required, abdominal x-ray has been ordered by the Cortrak team. Please confirm tube placement before using the Cortrak tube.   If the tube becomes dislodged please keep the tube and contact the Cortrak team at [email or web address] for replacement.  If after hours and replacement cannot be delayed, place a NG tube and confirm placement with an abdominal x-ray.    Mertie Clause, MS, RD, LDN Registered Dietitian II Please see AMiON for contact information.
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
## 2023-06-08 ENCOUNTER — Inpatient Hospital Stay (HOSPITAL_COMMUNITY): Payer: Medicare Other

## 2023-06-08 ENCOUNTER — Encounter (HOSPITAL_COMMUNITY): Payer: Self-pay

## 2023-06-08 ENCOUNTER — Other Ambulatory Visit: Payer: Self-pay

## 2023-06-08 LAB — GLUCOSE, CAPILLARY
Glucose-Capillary: 150 mg/dL — ABNORMAL HIGH (ref 70–99)
Glucose-Capillary: 173 mg/dL — ABNORMAL HIGH (ref 70–99)
Glucose-Capillary: 142 mg/dL — ABNORMAL HIGH (ref 70–99)
Glucose-Capillary: 153 mg/dL — ABNORMAL HIGH (ref 70–99)
Glucose-Capillary: 147 mg/dL — ABNORMAL HIGH (ref 70–99)
Glucose-Capillary: 140 mg/dL — ABNORMAL HIGH (ref 70–99)

## 2023-06-08 LAB — MAGNESIUM
Magnesium: 2 mg/dL (ref 1.7–2.4)
Magnesium: 2.2 mg/dL (ref 1.7–2.4)

## 2023-06-08 LAB — PHOSPHORUS
Phosphorus: 2.6 mg/dL (ref 2.5–4.6)
Phosphorus: 2.1 mg/dL — ABNORMAL LOW (ref 2.5–4.6)

## 2023-06-08 MED ORDER — VALPROIC ACID 250 MG/5ML PO SOLN
500.0000 mg | Freq: Three times a day (TID) | ORAL | Status: DC
  Administered 2023-06-08 – 2023-06-10 (×6): 500 mg
  Filled 2023-06-08 (×6): qty 10

## 2023-06-08 MED ORDER — QUETIAPINE FUMARATE 200 MG PO TABS
200.0000 mg | ORAL_TABLET | Freq: Two times a day (BID) | ORAL | Status: DC
  Administered 2023-06-08: 200 mg
  Filled 2023-06-08: qty 1

## 2023-06-08 MED ORDER — CLONAZEPAM 1 MG PO TABS
2.0000 mg | ORAL_TABLET | Freq: Three times a day (TID) | ORAL | Status: DC
Start: 2023-06-08 — End: 2023-06-14
  Administered 2023-06-08 – 2023-06-14 (×20): 2 mg
  Filled 2023-06-08 (×20): qty 2

## 2023-06-08 MED ORDER — CLONAZEPAM 1 MG PO TABS
2.0000 mg | ORAL_TABLET | Freq: Two times a day (BID) | ORAL | Status: DC
Start: 2023-06-08 — End: 2023-06-08
  Administered 2023-06-08: 2 mg
  Filled 2023-06-08: qty 2

## 2023-06-08 MED ORDER — QUETIAPINE FUMARATE 200 MG PO TABS
200.0000 mg | ORAL_TABLET | Freq: Three times a day (TID) | ORAL | Status: DC
Start: 2023-06-08 — End: 2023-06-21
  Administered 2023-06-08 – 2023-06-21 (×39): 200 mg
  Filled 2023-06-08 (×39): qty 1

## 2023-06-08 NOTE — Progress Notes (Signed)
OT Cancellation Note  Patient Details Name: SARIM PROPER MRN: [id number] DOB: 04/18/1956   Cancelled Treatment:    Reason Eval/Treat Not Completed: Patient's level of consciousness.  Spoke with nursing this morning. Pt is still vented and sedated. Plan is to begin weaning down sedation today although pt is still pretty agitated. Recommended holding therapy today. Will follow up with pt tomorrow.    Limmie Patricia, OTR/L,CBIS  Supplemental OT - [facility] and [facility] Secure Chat Preferred   06/08/2023, 9:30 AM
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
## 2023-06-09 ENCOUNTER — Inpatient Hospital Stay (HOSPITAL_COMMUNITY): Payer: Medicare Other

## 2023-06-09 LAB — BASIC METABOLIC PANEL
Anion gap: 11 (ref 5–15)
Anion gap: 7 (ref 5–15)
BUN: 14 mg/dL (ref 8–23)
BUN: 13 mg/dL (ref 8–23)
CO2: 32 mmol/L (ref 22–32)
CO2: 34 mmol/L — ABNORMAL HIGH (ref 22–32)
Calcium: 7.7 mg/dL — ABNORMAL LOW (ref 8.9–10.3)
Calcium: 8.1 mg/dL — ABNORMAL LOW (ref 8.9–10.3)
Chloride: 100 mmol/L (ref 98–111)
Chloride: 100 mmol/L (ref 98–111)
Creatinine, Ser: 0.85 mg/dL (ref 0.61–1.24)
Creatinine, Ser: 0.81 mg/dL (ref 0.61–1.24)
GFR, Estimated: >60 mL/min (ref >60)
GFR, Estimated: >60 mL/min (ref >60)
Glucose, Bld: 172 mg/dL — ABNORMAL HIGH (ref 70–99)
Glucose, Bld: 158 mg/dL — ABNORMAL HIGH (ref 70–99)
Potassium: 2.8 mmol/L — ABNORMAL LOW (ref 3.5–5.1)
Potassium: 4.4 mmol/L (ref 3.5–5.1)
Sodium: 143 mmol/L (ref 135–145)
Sodium: 141 mmol/L (ref 135–145)

## 2023-06-09 LAB — CBC
HCT: 28.9 % — ABNORMAL LOW (ref 39.0–52.0)
Hemoglobin: 9.2 g/dL — ABNORMAL LOW (ref 13.0–17.0)
MCH: 28.7 pg (ref 26.0–34.0)
MCHC: 31.8 g/dL (ref 30.0–36.0)
MCV: 90 fL (ref 80.0–100.0)
Platelets: 360 10*3/uL (ref 150–400)
RBC: 3.21 MIL/uL — ABNORMAL LOW (ref 4.22–5.81)
RDW: 13.4 % (ref 11.5–15.5)
WBC: 8.9 10*3/uL (ref 4.0–10.5)
nRBC: 0 % (ref 0.0–0.2)

## 2023-06-09 LAB — GLUCOSE, CAPILLARY
Glucose-Capillary: 144 mg/dL — ABNORMAL HIGH (ref 70–99)
Glucose-Capillary: 164 mg/dL — ABNORMAL HIGH (ref 70–99)
Glucose-Capillary: 144 mg/dL — ABNORMAL HIGH (ref 70–99)
Glucose-Capillary: 141 mg/dL — ABNORMAL HIGH (ref 70–99)
Glucose-Capillary: 162 mg/dL — ABNORMAL HIGH (ref 70–99)
Glucose-Capillary: 156 mg/dL — ABNORMAL HIGH (ref 70–99)

## 2023-06-09 LAB — MAGNESIUM: Magnesium: 2.3 mg/dL (ref 1.7–2.4)

## 2023-06-09 LAB — PHOSPHORUS: Phosphorus: 2.4 mg/dL — ABNORMAL LOW (ref 2.5–4.6)

## 2023-06-09 MED ORDER — SODIUM CHLORIDE 0.9 % IV SOLN
2.0000 g | INTRAVENOUS | Status: AC
Start: 2023-06-09 — End: 2023-06-16
  Administered 2023-06-09 – 2023-06-15 (×7): 2 g via INTRAVENOUS
  Filled 2023-06-09 (×7): qty 20

## 2023-06-09 MED ORDER — CALCIUM GLUCONATE-NACL 2-0.675 GM/100ML-% IV SOLN
2.0000 g | Freq: Once | INTRAVENOUS | Status: AC
  Administered 2023-06-09: 2000 mg via INTRAVENOUS
  Filled 2023-06-09: qty 100

## 2023-06-09 MED ORDER — POTASSIUM PHOSPHATES 15 MMOLE/5ML IV SOLN
30.0000 mmol | Freq: Once | INTRAVENOUS | Status: AC
  Administered 2023-06-09: 30 mmol via INTRAVENOUS
  Filled 2023-06-09: qty 10

## 2023-06-09 MED ORDER — SODIUM CHLORIDE 0.9 % IV SOLN: INTRAVENOUS | Status: AC | PRN

## 2023-06-09 MED ORDER — POTASSIUM CHLORIDE 20 MEQ PO PACK
40.0000 meq | PACK | ORAL | Status: AC
  Administered 2023-06-09 (×2): 40 meq
  Filled 2023-06-09 (×2): qty 2
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
## 2023-06-10 ENCOUNTER — Inpatient Hospital Stay (HOSPITAL_COMMUNITY): Payer: Medicare Other

## 2023-06-10 LAB — CBC
HCT: 27.7 % — ABNORMAL LOW (ref 39.0–52.0)
Hemoglobin: 8.6 g/dL — ABNORMAL LOW (ref 13.0–17.0)
MCH: 27.7 pg (ref 26.0–34.0)
MCHC: 31 g/dL (ref 30.0–36.0)
MCV: 89.4 fL (ref 80.0–100.0)
Platelets: 415 10*3/uL — ABNORMAL HIGH (ref 150–400)
RBC: 3.1 MIL/uL — ABNORMAL LOW (ref 4.22–5.81)
RDW: 13.7 % (ref 11.5–15.5)
WBC: 9 10*3/uL (ref 4.0–10.5)
nRBC: 0 % (ref 0.0–0.2)

## 2023-06-10 LAB — BASIC METABOLIC PANEL
Anion gap: 11 (ref 5–15)
BUN: 16 mg/dL (ref 8–23)
CO2: 31 mmol/L (ref 22–32)
Calcium: 8 mg/dL — ABNORMAL LOW (ref 8.9–10.3)
Chloride: 98 mmol/L (ref 98–111)
Creatinine, Ser: 1.02 mg/dL (ref 0.61–1.24)
GFR, Estimated: >60 mL/min (ref >60)
Glucose, Bld: 159 mg/dL — ABNORMAL HIGH (ref 70–99)
Potassium: 3.6 mmol/L (ref 3.5–5.1)
Sodium: 140 mmol/L (ref 135–145)

## 2023-06-10 LAB — CULTURE, RESPIRATORY W GRAM STAIN: Gram Stain: NONE SEEN

## 2023-06-10 LAB — GLUCOSE, CAPILLARY
Glucose-Capillary: 131 mg/dL — ABNORMAL HIGH (ref 70–99)
Glucose-Capillary: 143 mg/dL — ABNORMAL HIGH (ref 70–99)
Glucose-Capillary: 154 mg/dL — ABNORMAL HIGH (ref 70–99)
Glucose-Capillary: 155 mg/dL — ABNORMAL HIGH (ref 70–99)
Glucose-Capillary: 161 mg/dL — ABNORMAL HIGH (ref 70–99)
Glucose-Capillary: 162 mg/dL — ABNORMAL HIGH (ref 70–99)

## 2023-06-10 MED ORDER — VALPROIC ACID 250 MG/5ML PO SOLN
750.0000 mg | Freq: Three times a day (TID) | ORAL | Status: DC
  Administered 2023-06-10 – 2023-06-23 (×38): 750 mg
  Filled 2023-06-10 (×38): qty 15

## 2023-06-10 MED ORDER — KETAMINE HCL-SODIUM CHLORIDE 1000-0.9 MG/100ML-% IV SOLN
1.0000 mg/kg/h | INTRAVENOUS | Status: DC
  Administered 2023-06-10 – 2023-06-12 (×3): 0.5 mg/kg/h via INTRAVENOUS
  Administered 2023-06-13 – 2023-06-16 (×7): 1 mg/kg/h via INTRAVENOUS
  Filled 2023-06-10 (×12): qty 100

## 2023-06-10 MED ORDER — POTASSIUM CHLORIDE 20 MEQ PO PACK
40.0000 meq | PACK | Freq: Once | ORAL | Status: AC
  Administered 2023-06-10: 40 meq
  Filled 2023-06-10: qty 2

## 2023-06-10 MED ORDER — DEXMEDETOMIDINE HCL IN NACL 400 MCG/100ML IV SOLN
0.0000 ug/kg/h | INTRAVENOUS | Status: DC
  Administered 2023-06-10: 0.4 ug/kg/h via INTRAVENOUS
  Administered 2023-06-11: 0.6 ug/kg/h via INTRAVENOUS
  Administered 2023-06-11: 0.5 ug/kg/h via INTRAVENOUS
  Administered 2023-06-11: 0.4 ug/kg/h via INTRAVENOUS
  Administered 2023-06-12 (×2): 1.2 ug/kg/h via INTRAVENOUS
  Administered 2023-06-12: 0.8 ug/kg/h via INTRAVENOUS
  Administered 2023-06-12 – 2023-06-13 (×3): 1.2 ug/kg/h via INTRAVENOUS
  Administered 2023-06-13: 0.6 ug/kg/h via INTRAVENOUS
  Administered 2023-06-13: 1 ug/kg/h via INTRAVENOUS
  Administered 2023-06-13: 0.9 ug/kg/h via INTRAVENOUS
  Administered 2023-06-14 (×2): 1.2 ug/kg/h via INTRAVENOUS
  Administered 2023-06-14: 1 ug/kg/h via INTRAVENOUS
  Administered 2023-06-14: 1.2 ug/kg/h via INTRAVENOUS
  Administered 2023-06-14: 0.9 ug/kg/h via INTRAVENOUS
  Administered 2023-06-15 (×2): 1.5 ug/kg/h via INTRAVENOUS
  Administered 2023-06-15: 1.3 ug/kg/h via INTRAVENOUS
  Administered 2023-06-15: 1.2 ug/kg/h via INTRAVENOUS
  Administered 2023-06-16: 1.3 ug/kg/h via INTRAVENOUS
  Administered 2023-06-16: 1.5 ug/kg/h via INTRAVENOUS
  Administered 2023-06-16: 1 ug/kg/h via INTRAVENOUS
  Administered 2023-06-16: 1.3 ug/kg/h via INTRAVENOUS
  Administered 2023-06-16: 2 ug/kg/h via INTRAVENOUS
  Administered 2023-06-16: 1.4 ug/kg/h via INTRAVENOUS
  Administered 2023-06-17: 0.9 ug/kg/h via INTRAVENOUS
  Administered 2023-06-17: 0.8 ug/kg/h via INTRAVENOUS
  Administered 2023-06-17 – 2023-06-18 (×2): 0.7 ug/kg/h via INTRAVENOUS
  Administered 2023-06-18 (×2): 0.6 ug/kg/h via INTRAVENOUS
  Administered 2023-06-19: 0.2 ug/kg/h via INTRAVENOUS
  Administered 2023-06-20: 0.4 ug/kg/h via INTRAVENOUS
  Filled 2023-06-10: qty 200
  Filled 2023-06-10 (×15): qty 100
  Filled 2023-06-10: qty 200
  Filled 2023-06-10: qty 100
  Filled 2023-06-10: qty 200
  Filled 2023-06-10 (×10): qty 100
  Filled 2023-06-10: qty 200
  Filled 2023-06-10 (×3): qty 100

## 2023-06-10 MED ORDER — KETAMINE BOLUS VIA INFUSION
0.5000 mg/kg | INTRAVENOUS | Status: DC | PRN
  Administered 2023-06-14 – 2023-06-17 (×7): 35.1 mg via INTRAVENOUS
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
## 2023-06-11 ENCOUNTER — Inpatient Hospital Stay (HOSPITAL_COMMUNITY): Payer: Medicare Other

## 2023-06-11 LAB — CBC
HCT: 27.8 % — ABNORMAL LOW (ref 39.0–52.0)
Hemoglobin: 8.6 g/dL — ABNORMAL LOW (ref 13.0–17.0)
MCH: 28.1 pg (ref 26.0–34.0)
MCHC: 30.9 g/dL (ref 30.0–36.0)
MCV: 90.8 fL (ref 80.0–100.0)
Platelets: 443 K/uL — ABNORMAL HIGH (ref 150–400)
RBC: 3.06 MIL/uL — ABNORMAL LOW (ref 4.22–5.81)
RDW: 13.8 % (ref 11.5–15.5)
WBC: 8.8 K/uL (ref 4.0–10.5)
nRBC: 0 % (ref 0.0–0.2)

## 2023-06-11 LAB — GLUCOSE, CAPILLARY
Glucose-Capillary: 138 mg/dL — ABNORMAL HIGH (ref 70–99)
Glucose-Capillary: 146 mg/dL — ABNORMAL HIGH (ref 70–99)
Glucose-Capillary: 150 mg/dL — ABNORMAL HIGH (ref 70–99)
Glucose-Capillary: 156 mg/dL — ABNORMAL HIGH (ref 70–99)
Glucose-Capillary: 164 mg/dL — ABNORMAL HIGH (ref 70–99)
Glucose-Capillary: 169 mg/dL — ABNORMAL HIGH (ref 70–99)

## 2023-06-11 LAB — BASIC METABOLIC PANEL WITH GFR
Anion gap: 8 (ref 5–15)
BUN: 16 mg/dL (ref 8–23)
CO2: 35 mmol/L — ABNORMAL HIGH (ref 22–32)
Calcium: 7.9 mg/dL — ABNORMAL LOW (ref 8.9–10.3)
Chloride: 95 mmol/L — ABNORMAL LOW (ref 98–111)
Creatinine, Ser: 0.83 mg/dL (ref 0.61–1.24)
GFR, Estimated: >60 mL/min
Glucose, Bld: 164 mg/dL — ABNORMAL HIGH (ref 70–99)
Potassium: 4.2 mmol/L (ref 3.5–5.1)
Sodium: 138 mmol/L (ref 135–145)

## 2023-06-11 MED ORDER — FUROSEMIDE 10 MG/ML IJ SOLN
40.0000 mg | Freq: Once | INTRAMUSCULAR | Status: AC
  Administered 2023-06-11: 40 mg via INTRAVENOUS
  Filled 2023-06-11: qty 4
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
## 2023-06-12 ENCOUNTER — Inpatient Hospital Stay (HOSPITAL_COMMUNITY): Payer: Medicare Other

## 2023-06-12 ENCOUNTER — Inpatient Hospital Stay (HOSPITAL_COMMUNITY): Payer: Medicare Other | Admitting: Certified Registered Nurse Anesthetist

## 2023-06-12 DIAGNOSIS — J9600 Acute respiratory failure, unspecified whether with hypoxia or hypercapnia: Secondary | ICD-10-CM

## 2023-06-12 LAB — POCT I-STAT 7, (LYTES, BLD GAS, ICA,H+H)
Acid-Base Excess: 11 mmol/L — ABNORMAL HIGH (ref 0.0–2.0)
Bicarbonate: 37.6 mmol/L — ABNORMAL HIGH (ref 20.0–28.0)
Calcium, Ion: 1.11 mmol/L — ABNORMAL LOW (ref 1.15–1.40)
HCT: 29 % — ABNORMAL LOW (ref 39.0–52.0)
Hemoglobin: 9.9 g/dL — ABNORMAL LOW (ref 13.0–17.0)
O2 Saturation: 96 %
Patient temperature: 98.7
Potassium: 4 mmol/L (ref 3.5–5.1)
Sodium: 138 mmol/L (ref 135–145)
TCO2: 40 mmol/L — ABNORMAL HIGH (ref 22–32)
pCO2 arterial: 64.6 mmHg — ABNORMAL HIGH (ref 32–48)
pH, Arterial: 7.374 (ref 7.35–7.45)
pO2, Arterial: 85 mmHg (ref 83–108)

## 2023-06-12 LAB — GLUCOSE, CAPILLARY
Glucose-Capillary: 146 mg/dL — ABNORMAL HIGH (ref 70–99)
Glucose-Capillary: 152 mg/dL — ABNORMAL HIGH (ref 70–99)
Glucose-Capillary: 123 mg/dL — ABNORMAL HIGH (ref 70–99)
Glucose-Capillary: 110 mg/dL — ABNORMAL HIGH (ref 70–99)
Glucose-Capillary: 143 mg/dL — ABNORMAL HIGH (ref 70–99)

## 2023-06-12 MED ORDER — KETAMINE HCL 50 MG/5ML IJ SOSY
PREFILLED_SYRINGE | INTRAMUSCULAR | Status: AC
  Filled 2023-06-12: qty 10

## 2023-06-12 MED ORDER — FENTANYL CITRATE PF 50 MCG/ML IJ SOSY
PREFILLED_SYRINGE | INTRAMUSCULAR | Status: AC
  Filled 2023-06-12: qty 2

## 2023-06-12 MED ORDER — ROCURONIUM BROMIDE 10 MG/ML (PF) SYRINGE
PREFILLED_SYRINGE | INTRAVENOUS | Status: AC
  Administered 2023-06-12: 50 mg
  Filled 2023-06-12: qty 10

## 2023-06-12 MED ORDER — ETOMIDATE 2 MG/ML IV SOLN
INTRAVENOUS | Status: AC
  Administered 2023-06-12: 20 mg
  Filled 2023-06-12: qty 20

## 2023-06-12 MED ORDER — SUCCINYLCHOLINE CHLORIDE 200 MG/10ML IV SOSY
PREFILLED_SYRINGE | INTRAVENOUS | Status: AC
  Filled 2023-06-12: qty 10

## 2023-06-12 MED ORDER — MIDAZOLAM HCL 2 MG/2ML IJ SOLN
INTRAMUSCULAR | Status: AC
  Filled 2023-06-12: qty 2

## 2023-06-12 MED ORDER — FUROSEMIDE 10 MG/ML IJ SOLN
40.0000 mg | Freq: Once | INTRAMUSCULAR | Status: AC
  Administered 2023-06-12: 40 mg via INTRAVENOUS
  Filled 2023-06-12: qty 4

## 2023-06-12 NOTE — Progress Notes (Signed)
Patient ID: [name], male   DOB: 1956/03/04, 68 y.o.   MRN: [id number] After reintubation I spoke with his daughter again about possible tracheostomy. They plan to discuss this as a family.  Violeta Gelinas, MD, MPH, FACS Please use [email or web address] to contact on call provider
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
## 2023-06-12 NOTE — Procedures (Signed)
Extubation Procedure Note  Patient Details:   Name: [name] DOB: [birth year] MRN: [id number]   Airway Documentation:    Vent end date: 06/12/23 Vent end time: 0830   Evaluation  O2 sats: stable throughout Complications: No apparent complications Patient did tolerate procedure well. Bilateral Breath Sounds: Rhonchi   No  RT extubated patient per MD order with RN and MD at bedside. Cuff leak negative and MD aware and ok with continuing extubation. Patient tolerated well. No stridor or distress noted at this time. RT will continue to monitor as needed.  Jaquelyn Bitter 06/12/2023, 8:51 AM

## 2023-06-12 NOTE — Progress Notes (Addendum)
Patient extubated around 0830 and placed on 3L . Around 0900 patient had desaturation event to low 50s. Manually ventilated. RT, Anesthesia and provider at bedside. Patient re-intubated at this time. Family updated.

## 2023-06-12 NOTE — Anesthesia Procedure Notes (Addendum)
Procedure Name: Intubation Date/Time: 06/12/2023 9:17 AM  Performed by: [place] Nation, MDPre-anesthesia Checklist: Patient identified, Emergency Drugs available, Suction available, Patient being monitored and Timeout performed Patient Re-evaluated:Patient Re-evaluated prior to induction Oxygen Delivery Method: Ambu bag Preoxygenation: Pre-oxygenation with 100% oxygen Induction Type: IV induction, Rapid sequence and Cricoid Pressure applied Ventilation: Mask ventilation without difficulty Laryngoscope Size: Glidescope and 3 Grade View: Grade I Tube type: Subglottic suction tube Tube size: 8.0 mm Number of attempts: 1 Airway Equipment and Method: Stylet and Video-laryngoscopy Placement Confirmation: ETT inserted through vocal cords under direct vision, breath sounds checked- equal and bilateral and CO2 detector Secured at: 23 cm Tube secured with: Tape Dental Injury: Teeth and Oropharynx as per pre-operative assessment  Difficulty Due To: Difficult Airway- due to cervical collar and Difficult Airway-  due to neck instability Comments: Upon entering room patient was actively being intubated by the ICU following RSI. Initial attempt with glidescope was failed.   Assistance with mask ventilation with OA. Midline stabilization throughout given C-spine pathlology. First look by anesthesiologist with G1 view. Bloody secretions suctioned out. ETT passed smoothly past vocal cords. Some difficulty inserting further past subglottis following stylet removal. ETT twisted clockwise and slowly advanced. B/l breath sounds. VSS.

## 2023-06-12 NOTE — Progress Notes (Signed)
RT pulled ETT back 1cm per MD order. No complications. ETT was 27@ lips, ETT is now 26@lips .    RT obtained post intubation ABG: 7.34/64.6/85/37.6 RT decreased FiO2 from 100% to 80%.  RT will continue to monitor.
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
## 2023-06-12 NOTE — Progress Notes (Signed)
Patient ID: [name], male   DOB: [birth year], 67 y.o.   MRN: [id number] Initially did well after extubation but then had an acute desaturation event. Likely due to secretions. Reintubated by CCM/Anesthesia. Appreciate their help. I updated family.  Violeta Gelinas, MD, MPH, FACS Please use [email or web address] to contact on call provider

## 2023-06-12 NOTE — Progress Notes (Signed)
Intubation Procedure Note  YITZCHOK OVITT  [id number]  1956-06-16  Date:06/12/23  Time:9:17 AM   Provider Performing:Jensen Cheramie Kathie Rhodes Celine Mans    Procedure: Intubation (31500)  Indication(s) Respiratory Failure  Consent Risks of the procedure as well as the alternatives and risks of each were explained to the patient and/or caregiver.  Consent for the procedure was obtained and is signed in the bedside chart   Anesthesia Etomidate and Rocuronium   Time Out Verified patient identification, verified procedure, site/side was marked, verified correct patient position, special equipment/implants available, medications/allergies/relevant history reviewed, required imaging and test results available.   Sterile Technique Usual hand hygeine, masks, and gloves were used   Procedure Description Patient positioned in bed supine.  Sedation given as noted above.  Patient was intubated with endotracheal tube using Glidescope.  View was Grade 2 only posterior commissure .  Number of attempts was  2 .  Called to evaluate patient for reintubation after planned extubation this morning. Progressively lethargic with respiratory failure, suspicion for mucus plug. Given c-collar for C-spine fracture, limited mobility. Attempt x1 with limited view and copious secretions. Oral airway placed and patient bagged up. On second attempt there was good view of posterior commissure but I was unable to pass the ETT beyond the level of the cords. Anesthesia was called to bedside and subsequently intubated the patient. See separate procedure note.  Colorimetric CO2 detector was consistent with tracheal placement.   Complications/Tolerance See above. Chest X-ray is ordered to verify placement.   EBL Minimal   Specimen(s) None  Patient should be flagged with difficult airway.   Durel Salts, MD Pulmonary and Critical Care Medicine [facility] 06/12/2023 9:18 AM Pager: see AMION  If no response to pager,  please call critical care on call (see AMION) until 7pm After 7:00 pm call Elink

## 2023-06-12 NOTE — Progress Notes (Signed)
PT Cancellation Note  Patient Details Name: [name] MRN: [id number] DOB: 12/24/1955   Cancelled Treatment:    Reason Eval/Treat Not Completed: Medical issues which prohibited therapy - reintubated this am for desats, PT to check back tomorrow per RN request.   Marye Round, PT DPT Acute Rehabilitation Services Secure Chat Preferred  Office [phone]    Helaina Stefano Sheliah Plane 06/12/2023, 11:37 AM

## 2023-06-12 NOTE — Progress Notes (Addendum)
SLP Cancellation Note  Patient Details Name: MARSTON SHEWCHUK MRN: [id number] DOB: [birth year]   Cancelled treatment:       Reason Eval/Treat Not Completed: Patient not medically ready. Pt remains on vent, although per MD note, may be ready for extubation soon pending further conversations with family. SLP will continue to follow.     Mahala Menghini., M.A. CCC-SLP Acute Rehabilitation Services Office [phone]  Secure chat preferred  06/12/2023, 7:41 AM

## 2023-06-13 LAB — GLUCOSE, CAPILLARY
Glucose-Capillary: 117 mg/dL — ABNORMAL HIGH (ref 70–99)
Glucose-Capillary: 128 mg/dL — ABNORMAL HIGH (ref 70–99)
Glucose-Capillary: 133 mg/dL — ABNORMAL HIGH (ref 70–99)
Glucose-Capillary: 148 mg/dL — ABNORMAL HIGH (ref 70–99)
Glucose-Capillary: 148 mg/dL — ABNORMAL HIGH (ref 70–99)
Glucose-Capillary: 148 mg/dL — ABNORMAL HIGH (ref 70–99)
Glucose-Capillary: 150 mg/dL — ABNORMAL HIGH (ref 70–99)

## 2023-06-13 MED ORDER — GUAIFENESIN 100 MG/5ML PO LIQD
15.0000 mL | Freq: Four times a day (QID) | ORAL | Status: DC
  Administered 2023-06-13 – 2023-06-24 (×43): 15 mL
  Filled 2023-06-13: qty 20
  Filled 2023-06-13 (×2): qty 15
  Filled 2023-06-13: qty 20
  Filled 2023-06-13 (×7): qty 15
  Filled 2023-06-13: qty 20
  Filled 2023-06-13 (×2): qty 15
  Filled 2023-06-13: qty 20
  Filled 2023-06-13 (×13): qty 15
  Filled 2023-06-13: qty 20
  Filled 2023-06-13 (×10): qty 15
  Filled 2023-06-13: qty 20
  Filled 2023-06-13 (×3): qty 15

## 2023-06-13 MED ORDER — ACETAMINOPHEN 500 MG PO TABS
1000.0000 mg | ORAL_TABLET | Freq: Four times a day (QID) | ORAL | Status: DC
  Administered 2023-06-13 – 2023-06-22 (×31): 1000 mg
  Filled 2023-06-13 (×33): qty 2
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
## 2023-06-13 NOTE — Progress Notes (Signed)
SLP Cancellation Note  Patient Details Name: ANWAR BAACK MRN: [id number] DOB: 1956/02/07   Cancelled treatment:       Reason Eval/Treat Not Completed: Patient not medically ready. Will continue following.    Gwynneth Aliment, M.A., CF-SLP Speech Language Pathology, Acute Rehabilitation Services  Secure Chat preferred [phone]  06/13/2023, 10:07 AM

## 2023-06-13 NOTE — Progress Notes (Signed)
PT Cancellation Note  Patient Details Name: [name] MRN: [id number] DOB: 12-Jun-1956   Cancelled Treatment:    Reason Eval/Treat Not Completed: Medical issues which prohibited therapy;Patient not medically ready - intubated and sedated, will check back as appropriate.   Marye Round, PT DPT Acute Rehabilitation Services Secure Chat Preferred  Office [phone]     Truddie Coco 06/13/2023, 9:59 AM
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
## 2023-06-13 NOTE — Progress Notes (Signed)
OT Cancellation Note  Patient Details Name: [name] MRN: [id number] DOB: [birth year]   Cancelled Treatment:    Reason Eval/Treat Not Completed: Patient not medically ready (intubated and sedated)   Timmothy Euler, OTR/L  Acute Rehabilitation Services Office: [phone] .   [name] 06/13/2023, 10:45 AM

## 2023-06-14 LAB — RENAL FUNCTION PANEL
Albumin: 2.1 g/dL — ABNORMAL LOW (ref 3.5–5.0)
Anion gap: 7 (ref 5–15)
BUN: 23 mg/dL (ref 8–23)
CO2: 38 mmol/L — ABNORMAL HIGH (ref 22–32)
Calcium: 8.2 mg/dL — ABNORMAL LOW (ref 8.9–10.3)
Chloride: 97 mmol/L — ABNORMAL LOW (ref 98–111)
Creatinine, Ser: 0.8 mg/dL (ref 0.61–1.24)
GFR, Estimated: >60 mL/min (ref >60)
Glucose, Bld: 134 mg/dL — ABNORMAL HIGH (ref 70–99)
Phosphorus: 3.6 mg/dL (ref 2.5–4.6)
Potassium: 3.9 mmol/L (ref 3.5–5.1)
Sodium: 142 mmol/L (ref 135–145)

## 2023-06-14 LAB — BASIC METABOLIC PANEL
Anion gap: 9 (ref 5–15)
BUN: 23 mg/dL (ref 8–23)
CO2: 33 mmol/L — ABNORMAL HIGH (ref 22–32)
Calcium: 8 mg/dL — ABNORMAL LOW (ref 8.9–10.3)
Chloride: 98 mmol/L (ref 98–111)
Creatinine, Ser: 0.84 mg/dL (ref 0.61–1.24)
GFR, Estimated: >60 mL/min (ref >60)
Glucose, Bld: 134 mg/dL — ABNORMAL HIGH (ref 70–99)
Potassium: 3.5 mmol/L (ref 3.5–5.1)
Sodium: 140 mmol/L (ref 135–145)

## 2023-06-14 LAB — GLUCOSE, CAPILLARY
Glucose-Capillary: 126 mg/dL — ABNORMAL HIGH (ref 70–99)
Glucose-Capillary: 140 mg/dL — ABNORMAL HIGH (ref 70–99)
Glucose-Capillary: 112 mg/dL — ABNORMAL HIGH (ref 70–99)
Glucose-Capillary: 115 mg/dL — ABNORMAL HIGH (ref 70–99)
Glucose-Capillary: 123 mg/dL — ABNORMAL HIGH (ref 70–99)
Glucose-Capillary: 151 mg/dL — ABNORMAL HIGH (ref 70–99)

## 2023-06-14 LAB — MAGNESIUM: Magnesium: 2.4 mg/dL (ref 1.7–2.4)

## 2023-06-14 MED ORDER — FREE WATER
30.0000 mL | Status: DC
  Administered 2023-06-14 – 2023-07-02 (×103): 30 mL

## 2023-06-14 MED ORDER — POTASSIUM CHLORIDE 20 MEQ PO PACK
40.0000 meq | PACK | Freq: Once | ORAL | Status: AC
  Administered 2023-06-14: 40 meq
  Filled 2023-06-14: qty 2

## 2023-06-14 MED ORDER — DIAZEPAM 5 MG PO TABS
10.0000 mg | ORAL_TABLET | Freq: Four times a day (QID) | ORAL | Status: DC
  Administered 2023-06-15 – 2023-06-21 (×26): 10 mg
  Filled 2023-06-14 (×26): qty 2

## 2023-06-14 MED ORDER — MIDAZOLAM HCL 2 MG/2ML IJ SOLN: 2.0000 mg | Freq: Once | INTRAMUSCULAR | Status: DC

## 2023-06-14 MED ORDER — HALOPERIDOL LACTATE 5 MG/ML IJ SOLN
10.0000 mg | Freq: Once | INTRAMUSCULAR | Status: AC | PRN
  Administered 2023-06-15: 10 mg via INTRAVENOUS
  Filled 2023-06-14: qty 2

## 2023-06-14 MED ORDER — FUROSEMIDE 10 MG/ML IJ SOLN
40.0000 mg | Freq: Once | INTRAMUSCULAR | Status: AC
  Administered 2023-06-14: 40 mg via INTRAVENOUS
  Filled 2023-06-14: qty 4

## 2023-06-14 MED ORDER — BANATROL TF EN LIQD
60.0000 mL | Freq: Two times a day (BID) | ENTERAL | Status: DC
  Administered 2023-06-14 – 2023-06-21 (×16): 60 mL
  Filled 2023-06-14 (×16): qty 60
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
## 2023-06-14 NOTE — TOC Progression Note (Signed)
Transition of Care [facility]) - Progression Note    Patient Details  Name: VONG BARGER MRN: [id number] Date of Birth: 1956/01/06  [facility] CM/SW Contact  Glennon Mac, RN Phone Number: 06/14/2023, 4:00 PM  Clinical Narrative:    Patient remains sedated and intubated. Family has decided on tracheostomy, likely early next week. TOC continues to follow as patient progresses.    Expected Discharge Plan: IP Rehab Facility Barriers to Discharge: Continued Medical Work up  Expected Discharge Plan and Services   Discharge Planning Services: CM Consult   Living arrangements for the past 2 months: Single Family Home                                       Social Determinants of Health (SDOH) Interventions SDOH Screenings   Food Insecurity: Patient Unable To Answer (06/06/2023)  Housing: High Risk (06/06/2023)  Transportation Needs: Patient Unable To Answer (06/06/2023)  Utilities: Patient Unable To Answer (06/06/2023)  Alcohol Screen: Low Risk  (05/12/2022)  Depression (PHQ2-9): Low Risk  (05/12/2022)  Financial Resource Strain: Low Risk  (05/12/2022)  Physical Activity: Unknown (05/12/2022)  Social Connections: Moderately Isolated (05/12/2022)  Stress: No Stress Concern Present (05/12/2022)  Tobacco Use: Medium Risk (06/08/2023)    Readmission Risk Interventions     No data to display         Quintella Baton, RN, BSN  Trauma/Neuro ICU Case Manager [phone]
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
## 2023-06-15 LAB — GLUCOSE, CAPILLARY
Glucose-Capillary: 86 mg/dL (ref 70–99)
Glucose-Capillary: 126 mg/dL — ABNORMAL HIGH (ref 70–99)
Glucose-Capillary: 133 mg/dL — ABNORMAL HIGH (ref 70–99)
Glucose-Capillary: 132 mg/dL — ABNORMAL HIGH (ref 70–99)
Glucose-Capillary: 118 mg/dL — ABNORMAL HIGH (ref 70–99)
Glucose-Capillary: 115 mg/dL — ABNORMAL HIGH (ref 70–99)

## 2023-06-15 LAB — BASIC METABOLIC PANEL
Anion gap: 7 (ref 5–15)
BUN: 22 mg/dL (ref 8–23)
CO2: 32 mmol/L (ref 22–32)
Calcium: 7.8 mg/dL — ABNORMAL LOW (ref 8.9–10.3)
Chloride: 98 mmol/L (ref 98–111)
Creatinine, Ser: 0.82 mg/dL (ref 0.61–1.24)
GFR, Estimated: >60 mL/min (ref >60)
Glucose, Bld: 118 mg/dL — ABNORMAL HIGH (ref 70–99)
Potassium: 4 mmol/L (ref 3.5–5.1)
Sodium: 137 mmol/L (ref 135–145)

## 2023-06-15 MED ORDER — BETHANECHOL CHLORIDE 10 MG PO TABS
5.0000 mg | ORAL_TABLET | Freq: Three times a day (TID) | ORAL | Status: DC
  Administered 2023-06-15 – 2023-06-21 (×21): 5 mg
  Filled 2023-06-15 (×20): qty 1

## 2023-06-15 MED ORDER — BISACODYL 10 MG RE SUPP
10.0000 mg | Freq: Once | RECTAL | Status: AC
  Administered 2023-06-15: 10 mg via RECTAL
  Filled 2023-06-15: qty 1

## 2023-06-15 MED ORDER — FUROSEMIDE 10 MG/ML IJ SOLN
20.0000 mg | Freq: Once | INTRAMUSCULAR | Status: AC
  Administered 2023-06-15: 20 mg via INTRAVENOUS
  Filled 2023-06-15: qty 2

## 2023-06-15 MED ORDER — BETHANECHOL CHLORIDE 10 MG PO TABS
5.0000 mg | ORAL_TABLET | Freq: Three times a day (TID) | ORAL | Status: DC
  Filled 2023-06-15: qty 1
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
## 2023-06-16 ENCOUNTER — Inpatient Hospital Stay (HOSPITAL_COMMUNITY): Payer: Medicare Other | Admitting: Registered Nurse

## 2023-06-16 DIAGNOSIS — Z4589 Encounter for adjustment and management of other implanted devices: Secondary | ICD-10-CM | POA: Diagnosis not present

## 2023-06-16 LAB — GLUCOSE, CAPILLARY
Glucose-Capillary: 138 mg/dL — ABNORMAL HIGH (ref 70–99)
Glucose-Capillary: 131 mg/dL — ABNORMAL HIGH (ref 70–99)
Glucose-Capillary: 160 mg/dL — ABNORMAL HIGH (ref 70–99)
Glucose-Capillary: 152 mg/dL — ABNORMAL HIGH (ref 70–99)
Glucose-Capillary: 140 mg/dL — ABNORMAL HIGH (ref 70–99)
Glucose-Capillary: 156 mg/dL — ABNORMAL HIGH (ref 70–99)

## 2023-06-16 MED ORDER — PROPOFOL 10 MG/ML IV BOLUS
INTRAVENOUS | Status: DC | PRN
  Administered 2023-06-16: 100 mg via INTRAVENOUS

## 2023-06-16 MED ORDER — KETAMINE HCL 10 MG/ML IJ SOLN
1.0000 mg/kg/h | Status: DC
  Administered 2023-06-16 – 2023-06-17 (×2): 1 mg/kg/h via INTRAVENOUS
  Filled 2023-06-16 (×2): qty 100

## 2023-06-16 MED ORDER — ROCURONIUM BROMIDE 10 MG/ML (PF) SYRINGE
PREFILLED_SYRINGE | INTRAVENOUS | Status: DC | PRN
Start: 2023-06-16 — End: 2023-06-16
  Administered 2023-06-16: 30 mg via INTRAVENOUS

## 2023-06-16 MED ORDER — SODIUM CHLORIDE 0.9 % IV BOLUS
500.0000 mL | Freq: Once | INTRAVENOUS | Status: AC
  Administered 2023-06-16: 500 mL via INTRAVENOUS

## 2023-06-16 NOTE — Anesthesia Procedure Notes (Signed)
Procedure Name: Intubation Date/Time: 06/16/2023 10:21 PM  Performed by: Laruth Bouchard., CRNAPre-anesthesia Checklist: Patient identified, Emergency Drugs available, Suction available, Patient being monitored and Timeout performed Patient Re-evaluated:Patient Re-evaluated prior to induction Oxygen Delivery Method: Circle system utilized Preoxygenation: Pre-oxygenation with 100% oxygen Induction Type: IV induction Laryngoscope Size: Glidescope and 4 Grade View: Grade I Tube type: Oral Tube size: 8.0 mm Number of attempts: 1 Airway Equipment and Method: Video-laryngoscopy Adriana Simas exchange catheter) Placement Confirmation: ETT inserted through vocal cords under direct vision, positive ETCO2, CO2 detector and breath sounds checked- equal and bilateral Secured at: 22 cm Tube secured with: Tape Dental Injury: Teeth and Oropharynx as per pre-operative assessment  Comments: Anesthesia called to 4N15 for an ETT exchange as patient had ripped pilot balloon on current ETT. Patient was sedated (See chart), glidescope 4 was used to achieve grade 1 view of cords and current ETT, cook exchange catheter was advanced down, old ETT was removed and new 8.0 ETT was advanced down the cook catheter. MDA, CRNA, RT, and RN all watched ETT advance through cords, bilateral breath sounds verified, and positive color change present. Care of patient handed back over to ICU team and RT and placed back on ICU ventilator. All VSS throughout induction and intubation with no adverse events.

## 2023-06-16 NOTE — Progress Notes (Signed)
Pt's SBP still less than 90 despite titration of Precedex to 0.4 mcg while Fent at 75 mcg. Dr. Sheliah Hatch texted and the question of is the pt could be considered for a bolus presented.

## 2023-06-16 NOTE — Progress Notes (Incomplete)
With restraints on pt was able to pull pilot balloon off of ETT. Pt remains agitated despite medications but was still getting appropriate volumes after pilot balloon was pulled off the ETT. Respiratory on floor and updated on events at bedside. Trauma paged and notified of events and patients condition. Trauma requested anesthesia be paged for a tube exchange. Anesthesia paged and arrived at bedside for tube exchange. See anesthesia note for meds and technique. Pt tolerated procedure well. Post tube exchange pt now has bloody ETT secretions.

## 2023-06-16 NOTE — Progress Notes (Addendum)
Per Dr. Sheliah Hatch, "500 NS bolus for low BP, ok to place Foley cath after 3rd I&O cath, and non-restraints to be renewed". These verbals acknowledged with MD and on-coming RN.

## 2023-06-16 NOTE — Progress Notes (Signed)
Pt ripped the pilot balloon from the ET tube. Pt was still getting adequate volumes. CRNA called and tube was replaced with another 7.5 ETT. Intubation time 2217.

## 2023-06-16 NOTE — Progress Notes (Signed)
With restraints on pt was able to pull pilot balloon off of ETT. Pt remains agitated despite medications but was still getting appropriate volumes after pilot balloon was pulled off the ETT. Respiratory on floor and updated on events at bedside. Trauma paged and notified of events and patients condition. Trauma requested anesthesia be paged for a tube exchange. Anesthesia paged and arrived at bedside for tube exchange. See anesthesia note for meds and technique. Pt tolerated procedure well. Post tube exchange pt now has bloody ETT secretions.

## 2023-06-16 NOTE — Progress Notes (Signed)
Pt noted to have SBP <90, but pt rebounds after decreasing Precedex and Fent gtts. Dr. Sheliah Hatch informed FYI.

## 2023-06-16 NOTE — Progress Notes (Addendum)
Pt remains agitated, fighting vent, HR, RR, and BP elevated. Pt has not had much success via prn/bolus sedative meds nor anti-hypertensive. Dr. Magnus Ivan informed. See new order.

## 2023-06-16 NOTE — Plan of Care (Signed)
  Problem: Safety: Goal: Non-violent Restraint(s) Outcome: Progressing   Problem: Education: Goal: Knowledge of General Education information will improve Description: Including pain rating scale, medication(s)/side effects and non-pharmacologic comfort measures Outcome: Progressing   Problem: Health Behavior/Discharge Planning: Goal: Ability to manage health-related needs will improve Outcome: Progressing   Problem: Clinical Measurements: Goal: Respiratory complications will improve Outcome: Progressing   Problem: Activity: Goal: Risk for activity intolerance will decrease Outcome: Progressing   Problem: Nutrition: Goal: Adequate nutrition will be maintained Outcome: Progressing   Problem: Coping: Goal: Level of anxiety will decrease Outcome: Progressing

## 2023-06-16 NOTE — Progress Notes (Signed)
PT Cancellation Note  Patient Details Name: [name] MRN: [id number] DOB: May 08, 1956   Cancelled Treatment:    Reason Eval/Treat Not Completed: Patient not medically ready.  Pt has been agitated all day, fighting the vent.  Will hold today.   06/16/2023  Jacinto Halim., PT Acute Rehabilitation Services [phone]  (office)   Eliseo Gum Jionni Helming 06/16/2023, 4:05 PM

## 2023-06-16 NOTE — Progress Notes (Signed)
SLP Cancellation Note  Patient Details Name: [name] MRN: [id number] DOB: Jan 27, 1956   Cancelled treatment:       Reason Eval/Treat Not Completed: Medical issues which prohibited therapy. Pt remains on vent and per chart, family has decided on trach. Will continue to follow.     Mahala Menghini., M.A. CCC-SLP Acute Rehabilitation Services Office [phone]  Secure chat preferred  06/16/2023, 8:43 AM

## 2023-06-16 NOTE — TOC Progression Note (Signed)
Transition of Care [facility]) - Progression Note    Patient Details  Name: [name] MRN: [id number] Date of Birth: April 25, 1956  Transition of [facility] CM/SW Contact  Glennon Mac, RN Phone Number: 06/16/2023, 2:03 PM  Clinical Narrative:    Met with patient's son Niegel and daughter [name] at bedside; introduced myself and explained Case Manager role.  Patient agitated and fighting vent; family trying to comfort patient. Will continue to follow patient progression; anticipate tracheostomy next week.    Expected Discharge Plan: IP Rehab Facility Barriers to Discharge: Continued Medical Work up  Expected Discharge Plan and Services   Discharge Planning Services: CM Consult   Living arrangements for the past 2 months: Single Family Home                                       Social Determinants of Health (SDOH) Interventions SDOH Screenings   Food Insecurity: Patient Unable To Answer (06/06/2023)  Housing: High Risk (06/06/2023)  Transportation Needs: Patient Unable To Answer (06/06/2023)  Utilities: Patient Unable To Answer (06/06/2023)  Alcohol Screen: Low Risk  (05/12/2022)  Depression (PHQ2-9): Low Risk  (05/12/2022)  Financial Resource Strain: Low Risk  (05/12/2022)  Physical Activity: Unknown (05/12/2022)  Social Connections: Moderately Isolated (05/12/2022)  Stress: No Stress Concern Present (05/12/2022)  Tobacco Use: Medium Risk (06/08/2023)    Readmission Risk Interventions     No data to display         Quintella Baton, RN, BSN  Trauma/Neuro ICU Case Manager [phone]

## 2023-06-16 NOTE — Progress Notes (Signed)
Pt diaphoretic, agitated, SBP >180, HR elevated, and restless at this time. Pt given prn meds to adjust these signs and symptoms but not effective.

## 2023-06-16 NOTE — Progress Notes (Signed)
Pt started weaning at 0730. At 0757 pt very agitated, attempting to pull ETT tube out while slouching down in bed. Pt w/ his son and brother at bedside at this time.
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
## 2023-06-17 ENCOUNTER — Inpatient Hospital Stay (HOSPITAL_COMMUNITY): Payer: Medicare Other | Admitting: Anesthesiology

## 2023-06-17 ENCOUNTER — Inpatient Hospital Stay (HOSPITAL_COMMUNITY): Payer: Medicare Other

## 2023-06-17 DIAGNOSIS — Z4589 Encounter for adjustment and management of other implanted devices: Secondary | ICD-10-CM | POA: Diagnosis not present

## 2023-06-17 LAB — GLUCOSE, CAPILLARY
Glucose-Capillary: 130 mg/dL — ABNORMAL HIGH (ref 70–99)
Glucose-Capillary: 120 mg/dL — ABNORMAL HIGH (ref 70–99)
Glucose-Capillary: 132 mg/dL — ABNORMAL HIGH (ref 70–99)
Glucose-Capillary: 122 mg/dL — ABNORMAL HIGH (ref 70–99)
Glucose-Capillary: 108 mg/dL — ABNORMAL HIGH (ref 70–99)
Glucose-Capillary: 133 mg/dL — ABNORMAL HIGH (ref 70–99)

## 2023-06-17 MED ORDER — KETAMINE HCL-SODIUM CHLORIDE 1000-0.9 MG/100ML-% IV SOLN
0.5000 mg/kg/h | INTRAVENOUS | Status: DC
  Filled 2023-06-17: qty 100

## 2023-06-17 MED ORDER — SUCCINYLCHOLINE CHLORIDE 200 MG/10ML IV SOSY
PREFILLED_SYRINGE | INTRAVENOUS | Status: DC | PRN
  Administered 2023-06-17: 120 mg via INTRAVENOUS

## 2023-06-17 MED ORDER — BISACODYL 10 MG RE SUPP
10.0000 mg | Freq: Every day | RECTAL | Status: DC | PRN
  Administered 2023-06-17: 10 mg via RECTAL
  Filled 2023-06-17: qty 1

## 2023-06-17 MED ORDER — FENTANYL 2500MCG IN NS 250ML (10MCG/ML) PREMIX INFUSION
25.0000 ug/h | INTRAVENOUS | Status: DC
  Administered 2023-06-17: 75 ug/h via INTRAVENOUS
  Administered 2023-06-18: 150 ug/h via INTRAVENOUS
  Administered 2023-06-19: 125 ug/h via INTRAVENOUS
  Filled 2023-06-17 (×3): qty 250

## 2023-06-17 MED ORDER — PROPOFOL 10 MG/ML IV BOLUS
INTRAVENOUS | Status: DC | PRN
  Administered 2023-06-17: 150 mg via INTRAVENOUS

## 2023-06-17 MED ORDER — BACITRACIN ZINC 500 UNIT/GM EX OINT
TOPICAL_OINTMENT | CUTANEOUS | Status: AC
  Administered 2023-06-17: 1
  Filled 2023-06-17: qty 0.9

## 2023-06-17 MED ORDER — KETAMINE HCL 10 MG/ML IJ SOLN
0.5000 mg/kg/h | Status: DC
  Filled 2023-06-17: qty 100

## 2023-06-17 MED ORDER — FENTANYL CITRATE PF 50 MCG/ML IJ SOSY: 25.0000 ug | PREFILLED_SYRINGE | Freq: Once | INTRAMUSCULAR | Status: DC
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
## 2023-06-17 NOTE — Anesthesia Procedure Notes (Signed)
Procedure Name: Intubation Date/Time: 06/17/2023 3:43 PM  Performed by: Marcene Duos, MDPre-anesthesia Checklist: Patient identified, Emergency Drugs available, Suction available and Patient being monitored Patient Re-evaluated:Patient Re-evaluated prior to induction Oxygen Delivery Method: Ambu bag Preoxygenation: Pre-oxygenation with 100% oxygen Induction Type: IV induction and Rapid sequence Laryngoscope Size: Glidescope and 4 Grade View: Grade I Tube type: Oral Tube size: 7.5 mm Number of attempts: 1 Airway Equipment and Method: Stylet and Video-laryngoscopy Placement Confirmation: ETT inserted through vocal cords under direct vision, breath sounds checked- equal and bilateral and CO2 detector Secured at: 24 (gums) cm Tube secured with: Tape Dental Injury: Teeth and Oropharynx as per pre-operative assessment

## 2023-06-17 NOTE — Progress Notes (Signed)
Patient developed increased WOB and stridor with difficulty clearing secretions about 2 hours after extubation. Anesthesia was called and patient was reintubated without complication.

## 2023-06-17 NOTE — Transfer of Care (Signed)
Immediate Anesthesia Transfer of Care Note  Patient: [name]  Procedure(s) Performed: AN AD HOC INTUBATION  Patient Location: ICU  Anesthesia Type: intubated in ICU  Level of Consciousness: Patient remains intubated per anesthesia plan  Airway & Oxygen Therapy: Patient placed on Ventilator (see vital sign flow sheet for setting)  Post-op Assessment: Report given to RN and Post -op Vital signs reviewed and stable  Post vital signs: Reviewed and stable  Last Vitals:  Vitals Value Taken Time  BP 119/83 06/17/23 1736  Temp    Pulse 104 06/17/23 1744  Resp 31 06/17/23 1744  SpO2 97 % 06/17/23 1744  Vitals shown include unfiled device data.  Last Pain:  Vitals:   06/17/23 1600  TempSrc: Axillary  PainSc:          Complications: No notable events documented.

## 2023-06-17 NOTE — Progress Notes (Signed)
Patient re-examined this afternoon and discussed extensively with bedside RN. He has been weaning on PSV 5/5, 40% FiO2 since 7:30 this morning. He has good tidal volumes and has been maintaining sats. Has some thin frothy secretions but a very strong cough. He is very alert, fentanyl has been weaned down and turned off. Ketamine also turned off after my arrival at bedside, and patient is comfortable, alert and following commands. Decision made to give patient another extubation trial as agitation and mental status have improved, and he has a strong cough. Patient was extubated to nasal cannula, able to state his name after extubation. Will monitor very closely.

## 2023-06-17 NOTE — Progress Notes (Signed)
Trauma Event Note  See bedside note regarding full description of events. Patient extubated but required reintubation. Patient able to communicate, not agitated at time of extubation. Patient had multiple coughing episodes and appeared to be physically tired. Dr Freida Busman called to bedside and decision made to call anesthesia for controlled re-intubation. Time out performed, intubated without complication and no indication of difficult airway. CXR ordered. Handoff with bedside nurse Brittney who plans to update the family.  Last imported Vital Signs BP (!) 142/89   Pulse (!) 111   Temp 100 F (37.8 C) (Axillary)   Resp (!) 25   Ht 5' 7.99" (1.727 m)   Wt 67.2 kg   SpO2 98%   BMI 22.53 kg/m   Jill Side Amal Renbarger  Trauma Response RN  Please call TRN at [phone] for further assistance.

## 2023-06-17 NOTE — Procedures (Signed)
Extubation Procedure Note  Patient Details:   Name: [name] DOB: Jan 09, 1956 MRN: [id number]   Airway Documentation:    Vent end date: 06/17/23 Vent end time: 1520  Pt extubated w/o complication to 2 lpm. Pt able to verbalize; has strong, productive cough. Evaluation  O2 sats: stable throughout Complications: No apparent complications Patient did tolerate procedure well. Bilateral Breath Sounds: Diminished, Rhonchi   Yes  Leafy Ro 06/17/2023, 3:35 PM

## 2023-06-17 NOTE — Procedures (Signed)
Pt orally intubated on 1st attempt w/ glidescope via anesthesia. 7.5 @ 26cm +bbs; +etco2; cxr pending. Pt placed on vent at documented settings. Cont to monitor.

## 2023-06-17 NOTE — Progress Notes (Signed)
Performed a WUA this morning. Pt did very well weaning 5/5 PS/CPAP most of the day. Pt was very appropriate and redirectable, not requiring any medication boluses. He briskly followed commands & nodded and gestured appropriately. I was able to address most of his physical needs. Pt had a minimal cuff leak, but a strong cough. After extensive conversations with Dr. Freida Busman and TRN, we further titrated medications off and attempted another extubation @ 1520.  Pt did okay initially on 2LNC, but kept having coughing fits with excessive secretions. After about 1.5 hours, he became too tachypneic and really struggling. Anesthesia and RT were called to the bedside for reintubation. CXR ordered.  Fentanyl was restarted and an order for Ketamine was placed. Request per Dr. Freida Busman to not overly sedate pt if possible.   Pts son and brother updated at bedside. Pt currently resting comfortably. Will continue to monitor.

## 2023-06-18 LAB — GLUCOSE, CAPILLARY
Glucose-Capillary: 118 mg/dL — ABNORMAL HIGH (ref 70–99)
Glucose-Capillary: 138 mg/dL — ABNORMAL HIGH (ref 70–99)
Glucose-Capillary: 110 mg/dL — ABNORMAL HIGH (ref 70–99)
Glucose-Capillary: 123 mg/dL — ABNORMAL HIGH (ref 70–99)
Glucose-Capillary: 124 mg/dL — ABNORMAL HIGH (ref 70–99)
Glucose-Capillary: 131 mg/dL — ABNORMAL HIGH (ref 70–99)

## 2023-06-18 LAB — BASIC METABOLIC PANEL
Anion gap: 5 (ref 5–15)
BUN: 25 mg/dL — ABNORMAL HIGH (ref 8–23)
CO2: 33 mmol/L — ABNORMAL HIGH (ref 22–32)
Calcium: 8.2 mg/dL — ABNORMAL LOW (ref 8.9–10.3)
Chloride: 102 mmol/L (ref 98–111)
Creatinine, Ser: 0.85 mg/dL (ref 0.61–1.24)
GFR, Estimated: >60 mL/min (ref >60)
Glucose, Bld: 135 mg/dL — ABNORMAL HIGH (ref 70–99)
Potassium: 3.5 mmol/L (ref 3.5–5.1)
Sodium: 140 mmol/L (ref 135–145)

## 2023-06-18 NOTE — Progress Notes (Signed)
Pt producing substantially less urine today than yesterday; pt maintained 40-50 ml urine per hour but past 2 hours only produced 15; Trauma Dr Freida Busman notified. BMP already ordered for 5 am drawn and sent
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
## 2023-06-19 ENCOUNTER — Inpatient Hospital Stay (HOSPITAL_COMMUNITY): Payer: Medicare Other | Admitting: Anesthesiology

## 2023-06-19 ENCOUNTER — Encounter (HOSPITAL_COMMUNITY): Admission: EM | Disposition: A | Discharge status: Rehabilitation Facility | Payer: Self-pay | Source: Home / Self Care

## 2023-06-19 ENCOUNTER — Other Ambulatory Visit: Payer: Self-pay

## 2023-06-19 ENCOUNTER — Inpatient Hospital Stay (HOSPITAL_COMMUNITY): Payer: Medicare Other

## 2023-06-19 DIAGNOSIS — S0990XA Unspecified injury of head, initial encounter: Secondary | ICD-10-CM

## 2023-06-19 HISTORY — PX: TRACHEOSTOMY TUBE PLACEMENT: SHX814

## 2023-06-19 LAB — GLUCOSE, CAPILLARY
Glucose-Capillary: 100 mg/dL — ABNORMAL HIGH (ref 70–99)
Glucose-Capillary: 116 mg/dL — ABNORMAL HIGH (ref 70–99)
Glucose-Capillary: 145 mg/dL — ABNORMAL HIGH (ref 70–99)
Glucose-Capillary: 117 mg/dL — ABNORMAL HIGH (ref 70–99)
Glucose-Capillary: 155 mg/dL — ABNORMAL HIGH (ref 70–99)

## 2023-06-19 SURGERY — CREATION, TRACHEOSTOMY
Anesthesia: General | Site: Neck

## 2023-06-19 MED ORDER — MIDAZOLAM HCL 2 MG/2ML IJ SOLN
INTRAMUSCULAR | Status: AC
Start: 2023-06-19 — End: ?
  Filled 2023-06-19: qty 2

## 2023-06-19 MED ORDER — 0.9 % SODIUM CHLORIDE (POUR BTL) OPTIME
TOPICAL | Status: DC | PRN
  Administered 2023-06-19: 1000 mL

## 2023-06-19 MED ORDER — PROPOFOL 10 MG/ML IV BOLUS
INTRAVENOUS | Status: DC | PRN
Start: 2023-06-19 — End: 2023-06-19
  Administered 2023-06-19: 80 mg via INTRAVENOUS

## 2023-06-19 MED ORDER — SUGAMMADEX SODIUM 200 MG/2ML IV SOLN
INTRAVENOUS | Status: DC | PRN
Start: 2023-06-19 — End: 2023-06-19
  Administered 2023-06-19: 200 mg via INTRAVENOUS

## 2023-06-19 MED ORDER — LACTATED RINGERS IV SOLN: INTRAVENOUS | Status: DC | PRN

## 2023-06-19 MED ORDER — ROCURONIUM BROMIDE 100 MG/10ML IV SOLN
INTRAVENOUS | Status: DC | PRN
  Administered 2023-06-19: 50 mg via INTRAVENOUS

## 2023-06-19 MED ORDER — FENTANYL CITRATE (PF) 250 MCG/5ML IJ SOLN
INTRAMUSCULAR | Status: AC
  Filled 2023-06-19: qty 5

## 2023-06-19 MED ORDER — ONDANSETRON HCL 4 MG/2ML IJ SOLN
INTRAMUSCULAR | Status: DC | PRN
Start: 2023-06-19 — End: 2023-06-19
  Administered 2023-06-19: 4 mg via INTRAVENOUS

## 2023-06-19 MED ORDER — LIDOCAINE-EPINEPHRINE 1 %-1:100000 IJ SOLN
INTRAMUSCULAR | Status: AC
  Filled 2023-06-19: qty 1

## 2023-06-19 MED ORDER — MIDAZOLAM HCL 2 MG/2ML IJ SOLN
INTRAMUSCULAR | Status: DC | PRN
  Administered 2023-06-19: 2 mg via INTRAVENOUS

## 2023-06-19 MED ORDER — DEXMEDETOMIDINE HCL IN NACL 80 MCG/20ML IV SOLN
INTRAVENOUS | Status: DC | PRN
  Administered 2023-06-19: 8 ug via INTRAVENOUS
  Administered 2023-06-19: 12 ug via INTRAVENOUS

## 2023-06-19 MED ORDER — DEXAMETHASONE SODIUM PHOSPHATE 10 MG/ML IJ SOLN
INTRAMUSCULAR | Status: DC | PRN
  Administered 2023-06-19: 10 mg via INTRAVENOUS

## 2023-06-19 MED ORDER — MORPHINE SULFATE (PF) 2 MG/ML IV SOLN: 2.0000 mg | INTRAVENOUS | Status: DC | PRN

## 2023-06-19 MED ORDER — FENTANYL CITRATE (PF) 250 MCG/5ML IJ SOLN
INTRAMUSCULAR | Status: DC | PRN
Start: 2023-06-19 — End: 2023-06-19
  Administered 2023-06-19 (×2): 100 ug via INTRAVENOUS
  Administered 2023-06-19: 50 ug via INTRAVENOUS

## 2023-06-19 SURGICAL SUPPLY — 31 items
BAG COUNTER SPONGE SURGICOUNT (BAG) ×2 IMPLANT
BLADE CLIPPER SURG (BLADE) IMPLANT
CANISTER SUCT 3000ML PPV (MISCELLANEOUS) ×2 IMPLANT
COVER SURGICAL LIGHT HANDLE (MISCELLANEOUS) ×2 IMPLANT
DRAPE UTILITY XL STRL (DRAPES) ×2 IMPLANT
DRSG TEGADERM 4X4.75 (GAUZE/BANDAGES/DRESSINGS) ×2 IMPLANT
DRSG TELFA 3X8 NADH STRL (GAUZE/BANDAGES/DRESSINGS) ×2 IMPLANT
ELECT CAUTERY BLADE 6.4 (BLADE) ×2 IMPLANT
ELECT REM PT RETURN 9FT ADLT (ELECTROSURGICAL) ×1
ELECTRODE REM PT RTRN 9FT ADLT (ELECTROSURGICAL) ×2 IMPLANT
GAUZE 4X4 16PLY ~~LOC~~+RFID DBL (SPONGE) ×2 IMPLANT
GLOVE BIO SURGEON STRL SZ 6.5 (GLOVE) ×2 IMPLANT
GLOVE BIOGEL PI IND STRL 6 (GLOVE) ×2 IMPLANT
GOWN STRL REUS W/ TWL LRG LVL3 (GOWN DISPOSABLE) ×4 IMPLANT
KIT BASIN OR (CUSTOM PROCEDURE TRAY) ×2 IMPLANT
KIT TURNOVER KIT B (KITS) ×2 IMPLANT
NS IRRIG 1000ML POUR BTL (IV SOLUTION) ×2 IMPLANT
PACK EENT II TURBAN DRAPE (CUSTOM PROCEDURE TRAY) ×2 IMPLANT
PAD ARMBOARD 7.5X6 YLW CONV (MISCELLANEOUS) ×4 IMPLANT
PENCIL BUTTON HOLSTER BLD 10FT (ELECTRODE) ×2 IMPLANT
SPONGE INTESTINAL PEANUT (DISPOSABLE) ×2 IMPLANT
SUT SILK 2 0 SH (SUTURE) IMPLANT
SUT VIC AB 2-0 SH 27XBRD (SUTURE) IMPLANT
SUT VICRYL AB 3 0 TIES (SUTURE) ×2 IMPLANT
SYR 20ML LL LF (SYRINGE) ×2 IMPLANT
TOWEL GREEN STERILE (TOWEL DISPOSABLE) ×2 IMPLANT
TOWEL GREEN STERILE FF (TOWEL DISPOSABLE) ×2 IMPLANT
TRAY W/TRACH TUBE 7.5 (MISCELLANEOUS) IMPLANT
TRAY W/TRACH TUBE 8.5 (MISCELLANEOUS) IMPLANT
TUBE CONNECTING 12X1/4 (SUCTIONS) ×2 IMPLANT
TUBE TRACH 6.0 CUFF FLEX (MISCELLANEOUS) IMPLANT

## 2023-06-19 NOTE — Progress Notes (Signed)
SLP Cancellation Note  Patient Details Name: [name] MRN: [id number] DOB: 03-Jul-1956   Cancelled treatment:       Reason Eval/Treat Not Completed: Patient not medically ready. Per MD note, plan for trach today. Will f/u after this for readiness.     Mahala Menghini., M.A. CCC-SLP Acute Rehabilitation Services Office [phone]  Secure chat preferred  06/19/2023, 7:49 AM
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
## 2023-06-19 NOTE — Progress Notes (Signed)
PT Cancellation Note  Patient Details Name: [name] MRN: [id number] DOB: May 30, 1956   Cancelled Treatment:    Reason Eval/Treat Not Completed: Patient not medically ready.  Pt has new trach and nursing wants to hold today as they prepare him to be able to work with therapies 12/3. 06/19/2023  Jacinto Halim., PT Acute Rehabilitation Services [phone]  (office)   Eliseo Gum Kristalyn Bergstresser 06/19/2023, 3:43 PM

## 2023-06-19 NOTE — Anesthesia Procedure Notes (Signed)
Anesthesia Procedure Note Pt transported for 4N ICU. Ventilated via 100% O2 and ambu bag with transport monitors. Attached to anesthesia machine

## 2023-06-19 NOTE — Progress Notes (Signed)
SLP Cancellation Note  Patient Details Name: [name] MRN: [id number] DOB: 1956-07-02   Cancelled treatment:       Reason Eval/Treat Not Completed: Patient with new tracheostomy. Orders for SLP eval and treat for PMSV and swallowing received. Will follow pt closely for readiness for SLP interventions as appropriate.     Gwynneth Aliment, M.A., CF-SLP Speech Language Pathology, Acute Rehabilitation Services  Secure Chat preferred [phone]  06/19/2023, 11:34 AM

## 2023-06-19 NOTE — Op Note (Signed)
   Operative Note  Date: 06/19/2023  Procedure: percutaneous tracheostomy without bronchoscopic assistance  Pre-op diagnosis: prolonged mechanical ventilation Post-op diagnosis: same  Indication and clinical history: The patient  is a 67 y.o. year old male with prolonged mechanical ventilation  Surgeon: Diamantina Monks, MD Assistant: Earl Gala, [place]  Anesthesia: General  Findings:  Specimen: none EBL: <5cc  Drains/Implants: #6 Shiley cuffed  tracheostomy tube  Disposition: ICU  Description of procedure: The patient was positioned supine with a shoulder roll. Time-out was performed verifying correct patient, procedure, signature of informed consent, and pre-operative antibiotics as indicated. Anesthetic induction was uneventful and the patient was confirmed to be on 100% FiO2. The neck was prepped and draped in the usual sterile fashion. Palpation of neck anatomy was performed. A longitudinal incision was made in the neck and deepened down to the trachea.   The pilot balloon was deflated and the endotracheal tube slowly retracted proximally until the tip of the endotracheal tube was palpated just below the cricoid cartilage. An introducer needle was inserted between the second and third tracheal rings. A guidewire was passed through the introducer needle and the needle removed. Serial dilation was performed and a #6   cuffed Shiley and stylet were inserted over the guidewire and the guidewire and stylet removed. The inner cannula was inserted, the pilot balloon on the tracheostomy inflated, and the ventilator tubing disconnected from the endotracheal tube and connected to the tracheostomy. Chest rise, appropriate end-tidal CO2, and return tidal volumes were confirmed. The tracheostomy tube was sutured in four quadrants and a tracheostomy tie applied to the neck. The endotracheal tube was removed. The patient tolerated the procedure well. There were no complications. Post-procedure chest x-ray  was ordered to confirm tube position and the absence of a pneumothorax.   Diamantina Monks, MD General and Trauma Surgery [facility]

## 2023-06-19 NOTE — Anesthesia Preprocedure Evaluation (Addendum)
Anesthesia Evaluation  Patient identified by MRN, date of birth, ID band Patient unresponsive    Reviewed: Allergy & Precautions, Patient's Chart, lab work & pertinent test results  History of Anesthesia Complications (+) PONV and history of anesthetic complications  Airway Mallampati: Intubated       Dental no notable dental hx.    Pulmonary former smoker      + intubated    Cardiovascular hypertension, Pt. on medications  Rhythm:Regular Rate:Normal     Neuro/Psych  Headaches CVA  negative psych ROS   GI/Hepatic Neg liver ROS,GERD  Medicated,,  Endo/Other  negative endocrine ROS    Renal/GU negative Renal ROS  negative genitourinary   Musculoskeletal  (+) Arthritis , Osteoarthritis,    Abdominal Normal abdominal exam  (+)   Peds  Hematology Lab Results      Component                Value               Date                      WBC                      8.8                 06/11/2023                HGB                      9.9 (L)             06/12/2023                HCT                      29.0 (L)            06/12/2023                MCV                      90.8                06/11/2023                PLT                      443 (H)             06/11/2023              Anesthesia Other Findings   Reproductive/Obstetrics                             Anesthesia Physical Anesthesia Plan  ASA: 3  Anesthesia Plan: General   Post-op Pain Management:    Induction:   PONV Risk Score and Plan: 3 and Treatment may vary due to age or medical condition  Airway Management Planned: Oral ETT and Tracheostomy  Additional Equipment: None  Intra-op Plan:   Post-operative Plan: Post-operative intubation/ventilation  Informed Consent: I have reviewed the patients History and Physical, chart, labs and discussed the procedure including the risks, benefits and alternatives for the proposed  anesthesia with the patient or authorized representative who has indicated his/her understanding and acceptance.     Consent reviewed  with POA  Plan Discussed with: CRNA  Anesthesia Plan Comments:        Anesthesia Quick Evaluation

## 2023-06-19 NOTE — Transfer of Care (Signed)
Immediate Anesthesia Transfer of Care Note  Patient: [name]  Procedure(s) Performed: TRACHEOSTOMY (Neck)  Patient Location: ICU  Anesthesia Type:General  Level of Consciousness: awake, alert , and oriented  Airway & Oxygen Therapy: Patient connected to tracheostomy mask oxygen and Patient placed on Ventilator (see vital sign flow sheet for setting)  Post-op Assessment: Report given to RN and Post -op Vital signs reviewed and stable  Post vital signs: Reviewed and stable  Last Vitals:  Vitals Value Taken Time  BP    Temp    Pulse    Resp    SpO2      Last Pain:  Vitals:   06/19/23 0754  TempSrc: Axillary  PainSc:          Complications: No notable events documented.

## 2023-06-20 ENCOUNTER — Encounter (HOSPITAL_COMMUNITY): Payer: Self-pay | Admitting: Surgery

## 2023-06-20 LAB — CBC
HCT: 34.8 % — ABNORMAL LOW (ref 39.0–52.0)
Hemoglobin: 11 g/dL — ABNORMAL LOW (ref 13.0–17.0)
MCH: 28.6 pg (ref 26.0–34.0)
MCHC: 31.6 g/dL (ref 30.0–36.0)
MCV: 90.6 fL (ref 80.0–100.0)
Platelets: 950 K/uL (ref 150–400)
RBC: 3.84 MIL/uL — ABNORMAL LOW (ref 4.22–5.81)
RDW: 14.8 % (ref 11.5–15.5)
WBC: 13.1 K/uL — ABNORMAL HIGH (ref 4.0–10.5)
nRBC: 0 % (ref 0.0–0.2)

## 2023-06-20 LAB — BASIC METABOLIC PANEL WITH GFR
Anion gap: 10 (ref 5–15)
BUN: 22 mg/dL (ref 8–23)
CO2: 28 mmol/L (ref 22–32)
Calcium: 8.5 mg/dL — ABNORMAL LOW (ref 8.9–10.3)
Chloride: 99 mmol/L (ref 98–111)
Creatinine, Ser: 1.24 mg/dL (ref 0.61–1.24)
GFR, Estimated: >60 mL/min
Glucose, Bld: 144 mg/dL — ABNORMAL HIGH (ref 70–99)
Potassium: 3.8 mmol/L (ref 3.5–5.1)
Sodium: 137 mmol/L (ref 135–145)

## 2023-06-20 LAB — GLUCOSE, CAPILLARY
Glucose-Capillary: 138 mg/dL — ABNORMAL HIGH (ref 70–99)
Glucose-Capillary: 122 mg/dL — ABNORMAL HIGH (ref 70–99)
Glucose-Capillary: 145 mg/dL — ABNORMAL HIGH (ref 70–99)
Glucose-Capillary: 133 mg/dL — ABNORMAL HIGH (ref 70–99)
Glucose-Capillary: 159 mg/dL — ABNORMAL HIGH (ref 70–99)
Glucose-Capillary: 169 mg/dL — ABNORMAL HIGH (ref 70–99)

## 2023-06-20 MED ORDER — METOPROLOL TARTRATE 5 MG/5ML IV SOLN: 5.0000 mg | Freq: Four times a day (QID) | INTRAVENOUS | Status: DC | PRN

## 2023-06-20 MED ORDER — GLYCOPYRROLATE 0.2 MG/ML IJ SOLN
0.2000 mg | Freq: Two times a day (BID) | INTRAMUSCULAR | Status: DC
  Administered 2023-06-20 – 2023-06-24 (×8): 0.2 mg via INTRAVENOUS
  Filled 2023-06-20 (×8): qty 1

## 2023-06-20 MED ORDER — METOPROLOL TARTRATE 5 MG/5ML IV SOLN
5.0000 mg | Freq: Four times a day (QID) | INTRAVENOUS | Status: DC | PRN
  Administered 2023-06-28: 5 mg via INTRAVENOUS
  Filled 2023-06-20 (×2): qty 5

## 2023-06-20 MED ORDER — ORAL CARE MOUTH RINSE: 15.0000 mL | OROMUCOSAL | Status: DC | PRN

## 2023-06-20 MED ORDER — ORAL CARE MOUTH RINSE
15.0000 mL | OROMUCOSAL | Status: DC
  Administered 2023-06-20 – 2023-07-04 (×57): 15 mL via OROMUCOSAL

## 2023-06-20 NOTE — Progress Notes (Signed)
Inpatient Rehab Admissions Coordinator:   Chart reviewed. I will place an order for CIR consult.   Estill Dooms, PT, DPT Admissions Coordinator [phone] 06/20/23  4:58 PM
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
## 2023-06-20 NOTE — Anesthesia Postprocedure Evaluation (Signed)
Anesthesia Post Note  Patient: [name]  Procedure(s) Performed: TRACHEOSTOMY (Neck)     Patient location during evaluation: SICU Anesthesia Type: General Level of consciousness: sedated Pain management: pain level controlled Vital Signs Assessment: post-procedure vital signs reviewed and stable Respiratory status: patient remains intubated per anesthesia plan Cardiovascular status: stable Postop Assessment: no apparent nausea or vomiting Anesthetic complications: no   No notable events documented.  Last Vitals:  Vitals:   06/20/23 1200 06/20/23 1300  BP: (!) 140/97 (!) 143/80  Pulse: (!) 128 (!) 115  Resp: 19 (!) 30  Temp:    SpO2: 97% 90%    Last Pain:  Vitals:   06/20/23 1148  TempSrc: Axillary  PainSc:                  [name] [name]
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
## 2023-06-21 ENCOUNTER — Encounter (HOSPITAL_COMMUNITY): Payer: Self-pay

## 2023-06-21 DIAGNOSIS — I609 Nontraumatic subarachnoid hemorrhage, unspecified: Secondary | ICD-10-CM

## 2023-06-21 LAB — BASIC METABOLIC PANEL
Anion gap: 10 (ref 5–15)
BUN: 22 mg/dL (ref 8–23)
CO2: 29 mmol/L (ref 22–32)
Calcium: 8.7 mg/dL — ABNORMAL LOW (ref 8.9–10.3)
Chloride: 100 mmol/L (ref 98–111)
Creatinine, Ser: 0.7 mg/dL (ref 0.61–1.24)
GFR, Estimated: >60 mL/min (ref >60)
Glucose, Bld: 147 mg/dL — ABNORMAL HIGH (ref 70–99)
Potassium: 3.8 mmol/L (ref 3.5–5.1)
Sodium: 139 mmol/L (ref 135–145)

## 2023-06-21 LAB — GLUCOSE, CAPILLARY
Glucose-Capillary: 151 mg/dL — ABNORMAL HIGH (ref 70–99)
Glucose-Capillary: 134 mg/dL — ABNORMAL HIGH (ref 70–99)
Glucose-Capillary: 128 mg/dL — ABNORMAL HIGH (ref 70–99)
Glucose-Capillary: 145 mg/dL — ABNORMAL HIGH (ref 70–99)
Glucose-Capillary: 128 mg/dL — ABNORMAL HIGH (ref 70–99)
Glucose-Capillary: 161 mg/dL — ABNORMAL HIGH (ref 70–99)

## 2023-06-21 LAB — CBC
HCT: 29.5 % — ABNORMAL LOW (ref 39.0–52.0)
Hemoglobin: 9.4 g/dL — ABNORMAL LOW (ref 13.0–17.0)
MCH: 28.5 pg (ref 26.0–34.0)
MCHC: 31.9 g/dL (ref 30.0–36.0)
MCV: 89.4 fL (ref 80.0–100.0)
Platelets: 879 10*3/uL — ABNORMAL HIGH (ref 150–400)
RBC: 3.3 MIL/uL — ABNORMAL LOW (ref 4.22–5.81)
RDW: 15.4 % (ref 11.5–15.5)
WBC: 12.9 10*3/uL — ABNORMAL HIGH (ref 4.0–10.5)
nRBC: 0 % (ref 0.0–0.2)

## 2023-06-21 MED ORDER — QUETIAPINE FUMARATE 200 MG PO TABS
200.0000 mg | ORAL_TABLET | Freq: Two times a day (BID) | ORAL | Status: DC
  Administered 2023-06-21: 200 mg
  Filled 2023-06-21: qty 1

## 2023-06-21 MED ORDER — DIAZEPAM 5 MG PO TABS
7.5000 mg | ORAL_TABLET | Freq: Four times a day (QID) | ORAL | Status: DC
  Administered 2023-06-21 – 2023-06-22 (×3): 7.5 mg
  Filled 2023-06-21 (×3): qty 2

## 2023-06-21 MED ORDER — OXYCODONE HCL 5 MG PO TABS: 7.5000 mg | ORAL_TABLET | Freq: Four times a day (QID) | ORAL | Status: DC

## 2023-06-21 MED ORDER — POTASSIUM CHLORIDE 20 MEQ PO PACK
20.0000 meq | PACK | Freq: Once | ORAL | Status: AC
  Administered 2023-06-21: 20 meq
  Filled 2023-06-21: qty 1

## 2023-06-21 MED ORDER — OXYCODONE HCL 5 MG PO TABS
5.0000 mg | ORAL_TABLET | Freq: Four times a day (QID) | ORAL | Status: DC
  Administered 2023-06-21 – 2023-06-22 (×4): 5 mg
  Filled 2023-06-21 (×4): qty 1

## 2023-06-21 MED ORDER — OXYCODONE HCL 5 MG PO TABS: 7.5000 mg | ORAL_TABLET | ORAL | Status: DC
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
## 2023-06-21 NOTE — Progress Notes (Signed)
  This encounter was created in error - please disregard. Patient in hospital.
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
## 2023-06-22 ENCOUNTER — Inpatient Hospital Stay (HOSPITAL_COMMUNITY): Payer: Medicare Other

## 2023-06-22 LAB — CBC
HCT: 28.2 % — ABNORMAL LOW (ref 39.0–52.0)
Hemoglobin: 9 g/dL — ABNORMAL LOW (ref 13.0–17.0)
MCH: 28.2 pg (ref 26.0–34.0)
MCHC: 31.9 g/dL (ref 30.0–36.0)
MCV: 88.4 fL (ref 80.0–100.0)
Platelets: 853 10*3/uL — ABNORMAL HIGH (ref 150–400)
RBC: 3.19 MIL/uL — ABNORMAL LOW (ref 4.22–5.81)
RDW: 15.2 % (ref 11.5–15.5)
WBC: 9.4 10*3/uL (ref 4.0–10.5)
nRBC: 0 % (ref 0.0–0.2)

## 2023-06-22 LAB — GLUCOSE, CAPILLARY
Glucose-Capillary: 104 mg/dL — ABNORMAL HIGH (ref 70–99)
Glucose-Capillary: 121 mg/dL — ABNORMAL HIGH (ref 70–99)
Glucose-Capillary: 122 mg/dL — ABNORMAL HIGH (ref 70–99)
Glucose-Capillary: 129 mg/dL — ABNORMAL HIGH (ref 70–99)
Glucose-Capillary: 134 mg/dL — ABNORMAL HIGH (ref 70–99)
Glucose-Capillary: 145 mg/dL — ABNORMAL HIGH (ref 70–99)
Glucose-Capillary: 157 mg/dL — ABNORMAL HIGH (ref 70–99)

## 2023-06-22 LAB — BASIC METABOLIC PANEL
Anion gap: 11 (ref 5–15)
BUN: 25 mg/dL — ABNORMAL HIGH (ref 8–23)
CO2: 28 mmol/L (ref 22–32)
Calcium: 8.6 mg/dL — ABNORMAL LOW (ref 8.9–10.3)
Chloride: 100 mmol/L (ref 98–111)
Creatinine, Ser: 0.71 mg/dL (ref 0.61–1.24)
GFR, Estimated: >60 mL/min (ref >60)
Glucose, Bld: 134 mg/dL — ABNORMAL HIGH (ref 70–99)
Potassium: 3.8 mmol/L (ref 3.5–5.1)
Sodium: 139 mmol/L (ref 135–145)

## 2023-06-22 MED ORDER — POLYETHYLENE GLYCOL 3350 17 G PO PACK
17.0000 g | PACK | Freq: Every day | ORAL | Status: DC
  Administered 2023-06-22 – 2023-07-03 (×9): 17 g
  Filled 2023-06-22 (×9): qty 1

## 2023-06-22 MED ORDER — ACETAMINOPHEN 500 MG PO TABS
1000.0000 mg | ORAL_TABLET | Freq: Four times a day (QID) | ORAL | Status: DC | PRN
  Administered 2023-06-22 – 2023-07-02 (×17): 1000 mg
  Filled 2023-06-22 (×17): qty 2

## 2023-06-22 MED ORDER — DIAZEPAM 5 MG PO TABS
5.0000 mg | ORAL_TABLET | Freq: Four times a day (QID) | ORAL | Status: AC
  Administered 2023-06-22 – 2023-06-23 (×6): 5 mg
  Filled 2023-06-22 (×6): qty 1

## 2023-06-22 MED ORDER — MORPHINE SULFATE (PF) 2 MG/ML IV SOLN
2.0000 mg | INTRAVENOUS | Status: DC | PRN
  Administered 2023-06-22: 2 mg via INTRAVENOUS
  Filled 2023-06-22: qty 1

## 2023-06-22 MED ORDER — MELATONIN 3 MG PO TABS
3.0000 mg | ORAL_TABLET | Freq: Every evening | ORAL | Status: DC | PRN
  Administered 2023-06-22: 3 mg via ORAL
  Filled 2023-06-22: qty 1

## 2023-06-22 MED ORDER — QUETIAPINE FUMARATE 100 MG PO TABS
100.0000 mg | ORAL_TABLET | Freq: Two times a day (BID) | ORAL | Status: DC
  Administered 2023-06-22 – 2023-06-23 (×3): 100 mg
  Filled 2023-06-22 (×3): qty 1
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
## 2023-06-22 NOTE — Progress Notes (Signed)
Patient transferred to 4NP 05. Son at bedside. All belongings moved to new room.

## 2023-06-22 NOTE — Progress Notes (Signed)
Speech Language Pathology Treatment: [name] Speaking valve  Patient Details Name: ASIAH REYNEN MRN: [id number] DOB: [birth year] Today's Date: 06/22/2023 Time: 9629-5284 SLP Time Calculation (min) (ACUTE ONLY): 25 min  Assessment / Plan / Recommendation Clinical Impression  Patient seen by SLP for skilled treatment focused on PMV toleration/usage. His daughter and mother were present in room as well. RT performed tracheal suctioning just prior to SLP entering room. SLP completed oral care prior to donning PMV. No significant change in RR or SpO2 while PMV donned, however when SLP would doff PMV periodically, patient's RR would then increase and visibly, he was breathing harder and faster through trach. He responded to cues to cough, clear throat and speak with voice being very hypophonic, wet and gurgled/glottal sounding and speech being unintelligible. When patient cued to cough or throat clear, he was able to achieve some glottic closure and mobilize secretions slightly in oropharynx with SLP then suctioning out. Patient required consistent tactile and verbal cues to remain alert. Per RN, they are tapering him down from sedating medications but he is still getting Seroquel twice daily. SLP posted sign in room regarding PMV usage and precautions. He is still not ready for assessment of PO intake ability but SLP will continue to follow.  HPI        SLP Plan  Continue with current plan of care      Recommendations for follow up therapy are one component of a multi-disciplinary discharge planning process, led by the attending physician.  Recommendations may be updated based on patient status, additional functional criteria and insurance authorization.    Recommendations  Diet recommendations: NPO      PMSV Supervision: Full MD: Please consider changing trach tube to : Cuffless           Oral care QID;Staff/trained caregiver to provide oral care   Frequent or constant  Supervision/Assistance [place] (R49.1)     Continue with current plan of care     Angela Nevin, MA, CCC-SLP Speech Therapy
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
## 2023-06-23 ENCOUNTER — Encounter (HOSPITAL_COMMUNITY): Payer: Medicare Other

## 2023-06-23 LAB — GLUCOSE, CAPILLARY
Glucose-Capillary: 126 mg/dL — ABNORMAL HIGH (ref 70–99)
Glucose-Capillary: 132 mg/dL — ABNORMAL HIGH (ref 70–99)
Glucose-Capillary: 135 mg/dL — ABNORMAL HIGH (ref 70–99)
Glucose-Capillary: 137 mg/dL — ABNORMAL HIGH (ref 70–99)
Glucose-Capillary: 137 mg/dL — ABNORMAL HIGH (ref 70–99)
Glucose-Capillary: 140 mg/dL — ABNORMAL HIGH (ref 70–99)

## 2023-06-23 LAB — CBC
HCT: 27.9 % — ABNORMAL LOW (ref 39.0–52.0)
Hemoglobin: 9 g/dL — ABNORMAL LOW (ref 13.0–17.0)
MCH: 28.5 pg (ref 26.0–34.0)
MCHC: 32.3 g/dL (ref 30.0–36.0)
MCV: 88.3 fL (ref 80.0–100.0)
Platelets: 850 10*3/uL — ABNORMAL HIGH (ref 150–400)
RBC: 3.16 MIL/uL — ABNORMAL LOW (ref 4.22–5.81)
RDW: 14.9 % (ref 11.5–15.5)
WBC: 9.5 10*3/uL (ref 4.0–10.5)
nRBC: 0 % (ref 0.0–0.2)

## 2023-06-23 LAB — BASIC METABOLIC PANEL
Anion gap: 10 (ref 5–15)
BUN: 23 mg/dL (ref 8–23)
CO2: 27 mmol/L (ref 22–32)
Calcium: 8.6 mg/dL — ABNORMAL LOW (ref 8.9–10.3)
Chloride: 101 mmol/L (ref 98–111)
Creatinine, Ser: 0.6 mg/dL — ABNORMAL LOW (ref 0.61–1.24)
GFR, Estimated: >60 mL/min (ref >60)
Glucose, Bld: 137 mg/dL — ABNORMAL HIGH (ref 70–99)
Potassium: 3.7 mmol/L (ref 3.5–5.1)
Sodium: 138 mmol/L (ref 135–145)

## 2023-06-23 MED ORDER — MELATONIN 3 MG PO TABS
3.0000 mg | ORAL_TABLET | Freq: Every evening | ORAL | Status: DC | PRN
  Administered 2023-06-24 – 2023-06-30 (×6): 3 mg
  Filled 2023-06-23 (×6): qty 1

## 2023-06-23 MED ORDER — VALPROIC ACID 250 MG/5ML PO SOLN
500.0000 mg | Freq: Three times a day (TID) | ORAL | Status: DC
  Administered 2023-06-23 – 2023-06-26 (×9): 500 mg
  Filled 2023-06-23 (×9): qty 10

## 2023-06-23 MED ORDER — QUETIAPINE FUMARATE 50 MG PO TABS
50.0000 mg | ORAL_TABLET | Freq: Two times a day (BID) | ORAL | Status: DC
  Administered 2023-06-23 – 2023-06-25 (×4): 50 mg
  Filled 2023-06-23 (×4): qty 1

## 2023-06-23 MED ORDER — DIAZEPAM 2 MG PO TABS
4.0000 mg | ORAL_TABLET | Freq: Three times a day (TID) | ORAL | Status: DC
  Administered 2023-06-24: 4 mg
  Filled 2023-06-23: qty 2
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
## 2023-06-24 ENCOUNTER — Inpatient Hospital Stay (HOSPITAL_COMMUNITY): Payer: Medicare Other

## 2023-06-24 DIAGNOSIS — M79602 Pain in left arm: Secondary | ICD-10-CM

## 2023-06-24 LAB — CBC
HCT: 29.4 % — ABNORMAL LOW (ref 39.0–52.0)
Hemoglobin: 9.5 g/dL — ABNORMAL LOW (ref 13.0–17.0)
MCH: 28.3 pg (ref 26.0–34.0)
MCHC: 32.3 g/dL (ref 30.0–36.0)
MCV: 87.5 fL (ref 80.0–100.0)
Platelets: 860 10*3/uL — ABNORMAL HIGH (ref 150–400)
RBC: 3.36 MIL/uL — ABNORMAL LOW (ref 4.22–5.81)
RDW: 14.8 % (ref 11.5–15.5)
WBC: 8.6 10*3/uL (ref 4.0–10.5)
nRBC: 0 % (ref 0.0–0.2)

## 2023-06-24 LAB — BASIC METABOLIC PANEL
Anion gap: 10 (ref 5–15)
BUN: 23 mg/dL (ref 8–23)
CO2: 27 mmol/L (ref 22–32)
Calcium: 8.7 mg/dL — ABNORMAL LOW (ref 8.9–10.3)
Chloride: 101 mmol/L (ref 98–111)
Creatinine, Ser: 0.68 mg/dL (ref 0.61–1.24)
GFR, Estimated: >60 mL/min (ref >60)
Glucose, Bld: 137 mg/dL — ABNORMAL HIGH (ref 70–99)
Potassium: 3.8 mmol/L (ref 3.5–5.1)
Sodium: 138 mmol/L (ref 135–145)

## 2023-06-24 LAB — GLUCOSE, CAPILLARY
Glucose-Capillary: 113 mg/dL — ABNORMAL HIGH (ref 70–99)
Glucose-Capillary: 116 mg/dL — ABNORMAL HIGH (ref 70–99)
Glucose-Capillary: 124 mg/dL — ABNORMAL HIGH (ref 70–99)
Glucose-Capillary: 131 mg/dL — ABNORMAL HIGH (ref 70–99)
Glucose-Capillary: 133 mg/dL — ABNORMAL HIGH (ref 70–99)
Glucose-Capillary: 98 mg/dL (ref 70–99)

## 2023-06-24 MED ORDER — GUAIFENESIN 100 MG/5ML PO LIQD
15.0000 mL | Freq: Four times a day (QID) | ORAL | Status: DC
  Administered 2023-06-24 – 2023-06-25 (×4): 15 mL
  Filled 2023-06-24 (×4): qty 20

## 2023-06-24 MED ORDER — GLYCOPYRROLATE 0.2 MG/ML IJ SOLN
0.2000 mg | Freq: Three times a day (TID) | INTRAMUSCULAR | Status: DC
  Administered 2023-06-24 – 2023-07-04 (×31): 0.2 mg via INTRAVENOUS
  Filled 2023-06-24 (×32): qty 1

## 2023-06-24 MED ORDER — DIAZEPAM 2 MG PO TABS: 4.0000 mg | ORAL_TABLET | Freq: Two times a day (BID) | ORAL | Status: DC

## 2023-06-24 MED ORDER — DIAZEPAM 2 MG PO TABS
4.0000 mg | ORAL_TABLET | Freq: Three times a day (TID) | ORAL | Status: DC
Start: 2023-06-24 — End: 2023-06-26
  Administered 2023-06-24 – 2023-06-26 (×6): 4 mg
  Filled 2023-06-24 (×6): qty 2

## 2023-06-24 NOTE — Plan of Care (Signed)

## 2023-06-24 NOTE — Progress Notes (Signed)
Left upper extremity venous duplex has been completed. Preliminary results can be found in CV Proc through chart review.  Results were given to Barnetta Chapel PA.  06/24/23 10:17 AM Olen Cordial RVT

## 2023-06-24 NOTE — Plan of Care (Signed)
Excessive secretions requiring frequent suctioning.  Problem: Activity: Goal: Ability to tolerate increased activity will improve Outcome: Progressing   Problem: Respiratory: Goal: Ability to maintain a clear airway and adequate ventilation will improve Outcome: Progressing   Problem: Role Relationship: Goal: Method of communication will improve Outcome: Progressing
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
## 2023-06-25 LAB — GLUCOSE, CAPILLARY
Glucose-Capillary: 141 mg/dL — ABNORMAL HIGH (ref 70–99)
Glucose-Capillary: 127 mg/dL — ABNORMAL HIGH (ref 70–99)
Glucose-Capillary: 114 mg/dL — ABNORMAL HIGH (ref 70–99)
Glucose-Capillary: 114 mg/dL — ABNORMAL HIGH (ref 70–99)
Glucose-Capillary: 130 mg/dL — ABNORMAL HIGH (ref 70–99)
Glucose-Capillary: 127 mg/dL — ABNORMAL HIGH (ref 70–99)

## 2023-06-25 MED ORDER — QUETIAPINE FUMARATE 50 MG PO TABS
50.0000 mg | ORAL_TABLET | Freq: Every day | ORAL | Status: DC
  Administered 2023-06-26 – 2023-06-27 (×2): 50 mg
  Filled 2023-06-25 (×4): qty 1

## 2023-06-25 MED ORDER — MORPHINE SULFATE (PF) 2 MG/ML IV SOLN
1.0000 mg | INTRAVENOUS | Status: DC | PRN
  Administered 2023-06-28 – 2023-07-02 (×7): 2 mg via INTRAVENOUS
  Filled 2023-06-25 (×7): qty 1

## 2023-06-25 MED ORDER — GUAIFENESIN 100 MG/5ML PO LIQD
15.0000 mL | ORAL | Status: DC
  Administered 2023-06-25 – 2023-07-03 (×47): 15 mL
  Filled 2023-06-25 (×45): qty 20

## 2023-06-25 NOTE — Plan of Care (Signed)
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
## 2023-06-26 LAB — GLUCOSE, CAPILLARY
Glucose-Capillary: 131 mg/dL — ABNORMAL HIGH (ref 70–99)
Glucose-Capillary: 119 mg/dL — ABNORMAL HIGH (ref 70–99)
Glucose-Capillary: 139 mg/dL — ABNORMAL HIGH (ref 70–99)
Glucose-Capillary: 127 mg/dL — ABNORMAL HIGH (ref 70–99)
Glucose-Capillary: 133 mg/dL — ABNORMAL HIGH (ref 70–99)
Glucose-Capillary: 124 mg/dL — ABNORMAL HIGH (ref 70–99)

## 2023-06-26 MED ORDER — VALPROIC ACID 250 MG/5ML PO SOLN
500.0000 mg | Freq: Two times a day (BID) | ORAL | Status: DC
  Administered 2023-06-26 – 2023-07-03 (×13): 500 mg
  Filled 2023-06-26 (×13): qty 10

## 2023-06-26 MED ORDER — DIAZEPAM 2 MG PO TABS
4.0000 mg | ORAL_TABLET | Freq: Two times a day (BID) | ORAL | Status: DC
Start: 2023-06-26 — End: 2023-06-28
  Administered 2023-06-26 – 2023-06-28 (×4): 4 mg
  Filled 2023-06-26 (×4): qty 2

## 2023-06-26 NOTE — Procedures (Signed)
Tracheostomy Change Note  Patient Details:   Name: [name] DOB: 22-Nov-1955 MRN: [id number]    Airway Documentation:     Evaluation  O2 sats: stable throughout Complications: No apparent complications Patient did tolerate procedure well. Bilateral Breath Sounds: Rhonchi, Diminished  Pt tolerated procedure well. Pt positive for end tidal change after insertion of new trach. No complications noted.   Tacy Learn 06/26/2023, 3:09 PM
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
## 2023-06-26 NOTE — Care Management Important Message (Signed)
Important Message  Patient Details  Name: [name] MRN: [id number] Date of Birth: Sep 20, 1955   Important Message Given:  Yes - Medicare IM     Sherilyn Banker 06/26/2023, 4:12 PM

## 2023-06-26 NOTE — Plan of Care (Signed)
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
## 2023-06-26 NOTE — Plan of Care (Signed)
  Problem: Coping: Goal: Level of anxiety will decrease Outcome: Progressing   Problem: Elimination: Goal: Will not experience complications related to urinary retention Outcome: Progressing   Problem: Safety: Goal: Ability to remain free from injury will improve Outcome: Progressing   
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
## 2023-06-26 NOTE — TOC Progression Note (Signed)
Transition of Care [facility] - Progression Note    Patient Details  Name: [name] MRN: [id number] Date of Birth: 1955-11-25  Transition of Care [facility]) CM/SW Contact  Astrid Drafts Berna Spare, RN Phone Number: 06/26/2023, 4:01 PM  Clinical Narrative:    Extensive conversation with patient's son and daughter to discuss disposition:  they are unable to provide needed support at home, as they both work.  They desire SNF at discharge for rehab needs, and understand the delays with this due to new tracheostomy <30 days (placed on 12/2) and inability to swallow. They understand that patient may need more permanent means of feeding if unable to swallow, and that swallowing study has been ordered.  Daughter and son both live in the [place] area, and will desire SNF in this area.  They are appreciative of my call.  Will continue to follow progress and communicate with son/daughter regarding rehab options.    Expected Discharge Plan: Skilled Nursing Facility Barriers to Discharge: Continued Medical Work up  Expected Discharge Plan and Services   Discharge Planning Services: CM Consult   Living arrangements for the past 2 months: Single Family Home                                       Social Determinants of Health (SDOH) Interventions SDOH Screenings   Food Insecurity: Patient Unable To Answer (06/06/2023)  Housing: High Risk (06/06/2023)  Transportation Needs: Patient Unable To Answer (06/06/2023)  Utilities: Patient Unable To Answer (06/06/2023)  Alcohol Screen: Low Risk  (05/12/2022)  Depression (PHQ2-9): Low Risk  (05/12/2022)  Financial Resource Strain: Low Risk  (05/12/2022)  Physical Activity: Unknown (05/12/2022)  Social Connections: Moderately Isolated (05/12/2022)  Stress: No Stress Concern Present (05/12/2022)  Tobacco Use: Medium Risk (06/21/2023)    Readmission Risk Interventions     No data to display         Quintella Baton, RN, BSN  Trauma/Neuro ICU Case  Manager [phone]
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
## 2023-06-27 ENCOUNTER — Inpatient Hospital Stay (HOSPITAL_COMMUNITY): Payer: Medicare Other

## 2023-06-27 LAB — GLUCOSE, CAPILLARY
Glucose-Capillary: 109 mg/dL — ABNORMAL HIGH (ref 70–99)
Glucose-Capillary: 109 mg/dL — ABNORMAL HIGH (ref 70–99)
Glucose-Capillary: 126 mg/dL — ABNORMAL HIGH (ref 70–99)
Glucose-Capillary: 128 mg/dL — ABNORMAL HIGH (ref 70–99)
Glucose-Capillary: 133 mg/dL — ABNORMAL HIGH (ref 70–99)

## 2023-06-27 MED ORDER — CARMEX CLASSIC LIP BALM EX OINT
TOPICAL_OINTMENT | CUTANEOUS | Status: DC | PRN
  Filled 2023-06-27: qty 10
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
## 2023-06-27 NOTE — Progress Notes (Addendum)
   06/27/23 2021  Therapy Vitals  Pulse Rate (!) 102  Resp 20  Patient Position (if appropriate) Lying  Respiratory Assessment  Assessment Type Assess only  Respiratory Pattern Regular;Unlabored  Chest Assessment Chest expansion symmetrical  Bilateral Breath Sounds Diminished   Janina Mayo is removed pt tolerating
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
## 2023-06-27 NOTE — Plan of Care (Signed)
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
## 2023-06-27 NOTE — Progress Notes (Signed)
RT called to room pt deannulated himself RN in room pt vitals stable RT placed trach back with no incident RT confirmed placement via CO2 detector and also breath sounds sats in the high 90s waiting on xray also
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
## 2023-06-27 NOTE — Progress Notes (Addendum)
RT x2  unable to pass suction cather. Able to redirect trach with obturator but due to being positional unable to pass suction cather once trach ties secured. X3 attempts. Per Trauma Janee Morn) per order, okay to [facility] trach. Area cleansed, dried, dressing applied. Large stoma noted. Patient congested but has strong cough.
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
## 2023-06-28 LAB — GLUCOSE, CAPILLARY
Glucose-Capillary: 125 mg/dL — ABNORMAL HIGH (ref 70–99)
Glucose-Capillary: 122 mg/dL — ABNORMAL HIGH (ref 70–99)
Glucose-Capillary: 142 mg/dL — ABNORMAL HIGH (ref 70–99)
Glucose-Capillary: 129 mg/dL — ABNORMAL HIGH (ref 70–99)
Glucose-Capillary: 118 mg/dL — ABNORMAL HIGH (ref 70–99)

## 2023-06-28 MED ORDER — DIAZEPAM 2 MG PO TABS
3.0000 mg | ORAL_TABLET | Freq: Two times a day (BID) | ORAL | Status: DC
  Administered 2023-06-28 – 2023-06-29 (×3): 3 mg
  Filled 2023-06-28 (×3): qty 2

## 2023-06-28 NOTE — TOC Progression Note (Signed)
Transition of Care [facility]) - Progression Note    Patient Details  Name: [name] MRN: [id number] Date of Birth: 1956/02/23  Transition of Care [facility]) CM/SW Contact  Astrid Drafts Berna Spare, RN Phone Number: 06/28/2023, 2:37 PM  Clinical Narrative:    Extensive conversation with patient's daughter [name] and son Daejohn by phone.  Explained options for discharge including CIR (reviewed therapy requirements and support needed at discharge) and skilled nursing facility for rehab (slower paced rehab with less therapy/day, but can stay longer, if needed).  They state they understand these differences, and now prefer [facility].  They understand that they will have to provide 24h assistance at discharge after rehab stay, and plan to work together to make this happen.  Answered all questions to the best of my ability.   Notified Caitlin, admissions coordinator for CIR, of family's interest in acute inpatient rehab again.  She plans to have an [facility] reopen case.  Casimiro Needle, PA made aware of change in dc plan.    Expected Discharge Plan: IP Rehab Facility Barriers to Discharge: Continued Medical Work up  Expected Discharge Plan and Services   Discharge Planning Services: CM Consult   Living arrangements for the past 2 months: Single Family Home                                       Social Determinants of Health (SDOH) Interventions SDOH Screenings   Food Insecurity: Patient Unable To Answer (06/06/2023)  Housing: High Risk (06/06/2023)  Transportation Needs: Patient Unable To Answer (06/06/2023)  Utilities: Patient Unable To Answer (06/06/2023)  Alcohol Screen: Low Risk  (05/12/2022)  Depression (PHQ2-9): Low Risk  (05/12/2022)  Financial Resource Strain: Low Risk  (05/12/2022)  Physical Activity: Unknown (05/12/2022)  Social Connections: Moderately Isolated (05/12/2022)  Stress: No Stress Concern Present (05/12/2022)  Tobacco Use: Medium Risk (06/21/2023)    Readmission Risk  Interventions     No data to display         Quintella Baton, RN, BSN  Trauma/Neuro ICU Case Manager [phone]
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
## 2023-06-28 NOTE — Plan of Care (Signed)
  Problem: Education: Goal: Knowledge of General Education information will improve Description: Including pain rating scale, medication(s)/side effects and non-pharmacologic comfort measures Outcome: Progressing   Problem: Clinical Measurements: Goal: Will remain free from infection Outcome: Progressing Goal: Diagnostic test results will improve Outcome: Progressing Goal: Respiratory complications will improve Outcome: Progressing   Problem: Activity: Goal: Risk for activity intolerance will decrease Outcome: Progressing   Problem: Coping: Goal: Level of anxiety will decrease Outcome: Progressing   Problem: Elimination: Goal: Will not experience complications related to urinary retention Outcome: Progressing   Problem: Pain Management: Goal: General experience of comfort will improve Outcome: Progressing

## 2023-06-29 LAB — GLUCOSE, CAPILLARY
Glucose-Capillary: 118 mg/dL — ABNORMAL HIGH (ref 70–99)
Glucose-Capillary: 130 mg/dL — ABNORMAL HIGH (ref 70–99)
Glucose-Capillary: 224 mg/dL — ABNORMAL HIGH (ref 70–99)
Glucose-Capillary: 105 mg/dL — ABNORMAL HIGH (ref 70–99)
Glucose-Capillary: 130 mg/dL — ABNORMAL HIGH (ref 70–99)
Glucose-Capillary: 125 mg/dL — ABNORMAL HIGH (ref 70–99)
Glucose-Capillary: 153 mg/dL — ABNORMAL HIGH (ref 70–99)

## 2023-06-29 MED ORDER — TRAZODONE HCL 50 MG PO TABS
50.0000 mg | ORAL_TABLET | Freq: Every day | ORAL | Status: DC
  Administered 2023-06-29 – 2023-07-03 (×5): 50 mg via ORAL
  Filled 2023-06-29 (×5): qty 1

## 2023-06-29 MED ORDER — AMLODIPINE BESYLATE 10 MG PO TABS
10.0000 mg | ORAL_TABLET | Freq: Every day | ORAL | Status: DC
  Administered 2023-06-29 – 2023-07-03 (×4): 10 mg
  Filled 2023-06-29 (×4): qty 1

## 2023-06-29 MED ORDER — TRIAMTERENE-HCTZ 37.5-25 MG PO TABS
1.0000 | ORAL_TABLET | Freq: Every day | ORAL | Status: DC
  Administered 2023-06-29 – 2023-07-03 (×4): 1
  Filled 2023-06-29 (×6): qty 1
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
## 2023-06-29 NOTE — Progress Notes (Signed)
Inpatient Rehab Coordinator Note:  I met with patient and his mom at bedside to discuss CIR recommendations and goals/expectations of CIR stay.  We reviewed 3 hrs/day of therapy, physician follow up, and average length of stay 2 weeks (dependent upon progress) with goals of supervision.  Mom states she's unsure of what the plan is but is aware that pt's son is working on pulling together some increased support.  Pt is on board and hopeful for rehab.  I spoke to pt's son, Oryan, over the phone to discuss and reiterate goals/expectations.  I explained that pt would most certainly require 24/7 supervision initially at discharge---not forever, but for several weeks at least.  Isaiyah confirmed that between he and his spouse, pt's daughter, pt's brother, and his mother they have arranged a schedule to allow 24/7 supervision for pt at his own residence.  I confirmed home set up.  I reviewed need for insurance prior auth and I will start that request today.  We will continue to follow.   Estill Dooms, PT, DPT Admissions Coordinator [phone] 06/29/23  1:03 PM
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
## 2023-06-30 ENCOUNTER — Inpatient Hospital Stay (HOSPITAL_COMMUNITY): Payer: Medicare Other

## 2023-06-30 LAB — GLUCOSE, CAPILLARY
Glucose-Capillary: 140 mg/dL — ABNORMAL HIGH (ref 70–99)
Glucose-Capillary: 108 mg/dL — ABNORMAL HIGH (ref 70–99)
Glucose-Capillary: 110 mg/dL — ABNORMAL HIGH (ref 70–99)
Glucose-Capillary: 106 mg/dL — ABNORMAL HIGH (ref 70–99)
Glucose-Capillary: 127 mg/dL — ABNORMAL HIGH (ref 70–99)
Glucose-Capillary: 106 mg/dL — ABNORMAL HIGH (ref 70–99)

## 2023-06-30 MED ORDER — LIDOCAINE VISCOUS HCL 2 % MT SOLN
10.0000 mL | Freq: Once | OROMUCOSAL | Status: AC
  Administered 2023-06-30: 10 mL via OROMUCOSAL

## 2023-06-30 MED ORDER — ENSURE ENLIVE PO LIQD
237.0000 mL | Freq: Two times a day (BID) | ORAL | Status: DC
  Administered 2023-07-01 – 2023-07-04 (×8): 237 mL via ORAL

## 2023-06-30 MED ORDER — DIAZEPAM 2 MG PO TABS
2.0000 mg | ORAL_TABLET | Freq: Two times a day (BID) | ORAL | Status: DC
  Administered 2023-06-30 – 2023-07-03 (×6): 2 mg
  Filled 2023-06-30 (×6): qty 1

## 2023-06-30 NOTE — Progress Notes (Signed)
Cortrak Tube Team Note:   Received Secure Chat from Fluoroscopy that pt needed tube bridled. RD bridled tube where it had been taped at 67 cm.     Mertie Clause, MS, RD, LDN Registered Dietitian II Please see AMiON for contact information.

## 2023-06-30 NOTE — Progress Notes (Signed)
Pt pulled his cortrak out. MD notified. Per Dr. Janee Morn, cortrak could be replaced today since today is Friday.

## 2023-06-30 NOTE — Progress Notes (Signed)
Pt off the unit to Fluoro.
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
## 2023-06-30 NOTE — Progress Notes (Addendum)
Cortrak Tube Team Note:  Consult received to place a post-pyloric Cortrak feeding tube. Two Cortrak RDs attempted placement of Cortrak feeding tube but unable to advance tube distal to GE junction and into the stomach. Tube kept coiling/kinking on itself during placement attempt. MD and RN made aware. Recommend consulting radiology for small-bore feeding tube placement under fluoroscopy. Cortrak tube left in room. Cortrak RD available to bridle small-bore feeding tube if placed in radiology.   Mertie Clause, MS, RD, LDN Registered Dietitian II Please see AMiON for contact information.

## 2023-06-30 NOTE — Progress Notes (Addendum)
Inpatient Rehab Admissions Coordinator:   [facility]) requesting a peer to peer for CIR auth.  I've asked Dr. Riley Kill to complete.   1415: Peer to peer complete.  Await determination.   Estill Dooms, PT, DPT Admissions Coordinator [phone] 06/30/23  1:36 PM

## 2023-06-30 NOTE — Progress Notes (Signed)
Inpatient Rehab Admissions Coordinator:   Awaiting insurance auth determination from [employer] [employer] Medicare.  I will follow.   Estill Dooms, PT, DPT Admissions Coordinator [phone] 06/30/23  9:53 AM
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
## 2023-06-30 NOTE — Care Management Important Message (Signed)
Important Message  Patient Details  Name: [name] MRN: [id number] Date of Birth: February 03, 1956   Important Message Given:  Yes - Medicare IM     Sherilyn Banker 06/30/2023, 3:47 PM
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
## 2023-07-01 LAB — GLUCOSE, CAPILLARY
Glucose-Capillary: 128 mg/dL — ABNORMAL HIGH (ref 70–99)
Glucose-Capillary: 130 mg/dL — ABNORMAL HIGH (ref 70–99)
Glucose-Capillary: 135 mg/dL — ABNORMAL HIGH (ref 70–99)
Glucose-Capillary: 136 mg/dL — ABNORMAL HIGH (ref 70–99)
Glucose-Capillary: 145 mg/dL — ABNORMAL HIGH (ref 70–99)
Glucose-Capillary: 146 mg/dL — ABNORMAL HIGH (ref 70–99)

## 2023-07-01 MED ORDER — MELATONIN 5 MG PO TABS
5.0000 mg | ORAL_TABLET | Freq: Every day | ORAL | Status: DC | PRN
  Administered 2023-07-01 – 2023-07-02 (×2): 5 mg
  Filled 2023-07-01: qty 1
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
## 2023-07-01 NOTE — Plan of Care (Signed)
  Problem: Education: Goal: Knowledge of General Education information will improve Description Including pain rating scale, medication(s)/side effects and non-pharmacologic comfort measures Outcome: Progressing   Problem: Health Behavior/Discharge Planning: Goal: Ability to manage health-related needs will improve Outcome: Progressing   Problem: Clinical Measurements: Goal: Will remain free from infection Outcome: Progressing Goal: Diagnostic test results will improve Outcome: Progressing Goal: Respiratory complications will improve Outcome: Progressing Goal: Cardiovascular complication will be avoided Outcome: Progressing   Problem: Activity: Goal: Risk for activity intolerance will decrease Outcome: Progressing   Problem: Nutrition: Goal: Adequate nutrition will be maintained Outcome: Progressing   Problem: Coping: Goal: Level of anxiety will decrease Outcome: Progressing

## 2023-07-02 ENCOUNTER — Inpatient Hospital Stay (HOSPITAL_COMMUNITY): Payer: Medicare Other

## 2023-07-02 LAB — GLUCOSE, CAPILLARY
Glucose-Capillary: 128 mg/dL — ABNORMAL HIGH (ref 70–99)
Glucose-Capillary: 137 mg/dL — ABNORMAL HIGH (ref 70–99)
Glucose-Capillary: 139 mg/dL — ABNORMAL HIGH (ref 70–99)
Glucose-Capillary: 123 mg/dL — ABNORMAL HIGH (ref 70–99)
Glucose-Capillary: 129 mg/dL — ABNORMAL HIGH (ref 70–99)
Glucose-Capillary: 130 mg/dL — ABNORMAL HIGH (ref 70–99)

## 2023-07-02 LAB — VANCOMYCIN, TROUGH: Vancomycin Tr: <4 ug/mL — ABNORMAL LOW (ref 15–20)

## 2023-07-02 MED ORDER — BISACODYL 5 MG PO TBEC: 5.0000 mg | DELAYED_RELEASE_TABLET | Freq: Every day | ORAL | Status: DC | PRN
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
## 2023-07-02 NOTE — Plan of Care (Signed)
Progressing toward goals. Pleasantly confused, redirectable. Removed lines when turning in bed.

## 2023-07-03 LAB — GLUCOSE, CAPILLARY
Glucose-Capillary: 123 mg/dL — ABNORMAL HIGH (ref 70–99)
Glucose-Capillary: 103 mg/dL — ABNORMAL HIGH (ref 70–99)
Glucose-Capillary: 131 mg/dL — ABNORMAL HIGH (ref 70–99)
Glucose-Capillary: 150 mg/dL — ABNORMAL HIGH (ref 70–99)
Glucose-Capillary: 122 mg/dL — ABNORMAL HIGH (ref 70–99)

## 2023-07-03 MED ORDER — MELATONIN 5 MG PO TABS: 5.0000 mg | ORAL_TABLET | Freq: Every day | ORAL | Status: DC | PRN

## 2023-07-03 MED ORDER — FREE WATER
30.0000 mL | Status: DC
  Administered 2023-07-03 – 2023-07-04 (×5): 30 mL via ORAL

## 2023-07-03 MED ORDER — OXYCODONE HCL 5 MG PO TABS: 5.0000 mg | ORAL_TABLET | ORAL | Status: DC | PRN

## 2023-07-03 MED ORDER — DIAZEPAM 2 MG PO TABS
2.0000 mg | ORAL_TABLET | Freq: Two times a day (BID) | ORAL | Status: DC
  Administered 2023-07-03 – 2023-07-04 (×2): 2 mg via ORAL
  Filled 2023-07-03 (×2): qty 1

## 2023-07-03 MED ORDER — GUAIFENESIN 100 MG/5ML PO LIQD
15.0000 mL | ORAL | Status: DC
  Administered 2023-07-03 – 2023-07-04 (×5): 15 mL via ORAL
  Filled 2023-07-03 (×6): qty 20

## 2023-07-03 MED ORDER — TRIAMTERENE-HCTZ 37.5-25 MG PO TABS
1.0000 | ORAL_TABLET | Freq: Every day | ORAL | Status: DC
  Administered 2023-07-04: 1 via ORAL
  Filled 2023-07-03: qty 1

## 2023-07-03 MED ORDER — POLYETHYLENE GLYCOL 3350 17 G PO PACK
17.0000 g | PACK | Freq: Every day | ORAL | Status: DC
  Administered 2023-07-04: 17 g via ORAL
  Filled 2023-07-03: qty 1

## 2023-07-03 MED ORDER — AMLODIPINE BESYLATE 10 MG PO TABS
10.0000 mg | ORAL_TABLET | Freq: Every day | ORAL | Status: DC
Start: 2023-07-04 — End: 2023-07-04
  Administered 2023-07-04: 10 mg via ORAL
  Filled 2023-07-03: qty 1

## 2023-07-03 MED ORDER — VALPROIC ACID 250 MG/5ML PO SOLN
500.0000 mg | Freq: Two times a day (BID) | ORAL | Status: DC
  Administered 2023-07-03 – 2023-07-04 (×2): 500 mg via ORAL
  Filled 2023-07-03 (×2): qty 10

## 2023-07-03 MED ORDER — ACETAMINOPHEN 500 MG PO TABS
1000.0000 mg | ORAL_TABLET | Freq: Four times a day (QID) | ORAL | Status: DC | PRN
  Administered 2023-07-04: 1000 mg via ORAL
  Filled 2023-07-03: qty 2

## 2023-07-03 NOTE — Progress Notes (Signed)
Inpatient Rehab Admissions Coordinator:   I did receive insurance approval for CIR admit.  I do not have a bed for this patient to admit today but hopeful for admission in the next 1-2 days pending bed availability.   Estill Dooms, PT, DPT Admissions Coordinator [phone] 07/03/23  11:09 AM

## 2023-07-03 NOTE — Plan of Care (Signed)
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
## 2023-07-03 NOTE — Plan of Care (Signed)
  Problem: Education: Goal: Knowledge of General Education information will improve Description: Including pain rating scale, medication(s)/side effects and non-pharmacologic comfort measures Outcome: Progressing   Problem: Health Behavior/Discharge Planning: Goal: Ability to manage health-related needs will improve Outcome: Progressing   Problem: Clinical Measurements: Goal: Will remain free from infection Outcome: Progressing Goal: Cardiovascular complication will be avoided Outcome: Progressing   Problem: Activity: Goal: Risk for activity intolerance will decrease Outcome: Progressing   Problem: Nutrition: Goal: Adequate nutrition will be maintained Outcome: Progressing   Problem: Coping: Goal: Level of anxiety will decrease Outcome: Progressing   Problem: Elimination: Goal: Will not experience complications related to urinary retention Outcome: Progressing   Problem: Safety: Goal: Ability to remain free from injury will improve Outcome: Progressing
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
## 2023-07-03 NOTE — Progress Notes (Signed)
Educated pt on Cortrak removal, pt verbalized understanding. Cortrak removed per MD order. Cortrak intact and unremarkable with no complications. Pt tolerated removal well. VSS.
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
## 2023-07-03 NOTE — TOC Progression Note (Signed)
Transition of Care [facility]) - Progression Note    Patient Details  Name: [name] MRN: [id number] Date of Birth: 19-Oct-1955  Transition of Care [facility]) CM/SW Contact  Glennon Mac, RN Phone Number: 07/03/2023, 4:16 PM  Clinical Narrative:    Insurance authorization has been received for CIR admission, likely next 24-48h.     Expected Discharge Plan: IP Rehab Facility Barriers to Discharge: Continued Medical Work up  Expected Discharge Plan and Services   Discharge Planning Services: CM Consult   Living arrangements for the past 2 months: Single Family Home                                       Social Determinants of Health (SDOH) Interventions SDOH Screenings   Food Insecurity: Patient Unable To Answer (06/06/2023)  Housing: High Risk (06/06/2023)  Transportation Needs: Patient Unable To Answer (06/06/2023)  Utilities: Patient Unable To Answer (06/06/2023)  Alcohol Screen: Low Risk  (05/12/2022)  Depression (PHQ2-9): Low Risk  (05/12/2022)  Financial Resource Strain: Low Risk  (05/12/2022)  Physical Activity: Unknown (05/12/2022)  Social Connections: Moderately Isolated (05/12/2022)  Stress: No Stress Concern Present (05/12/2022)  Tobacco Use: Medium Risk (06/21/2023)    Readmission Risk Interventions     No data to display         Quintella Baton, RN, BSN  Trauma/Neuro ICU Case Manager [phone]

## 2023-07-04 ENCOUNTER — Encounter (HOSPITAL_COMMUNITY): Payer: Self-pay

## 2023-07-04 ENCOUNTER — Other Ambulatory Visit: Payer: Self-pay

## 2023-07-04 ENCOUNTER — Inpatient Hospital Stay (HOSPITAL_COMMUNITY)
Admission: AD | Admit: 2023-07-04 | Discharge: 2023-07-15 | DRG: 945 | Disposition: A | Discharge status: Home / Self Care | Payer: Medicare Other | Source: Intra-hospital | Attending: Physical Medicine and Rehabilitation | Admitting: Physical Medicine and Rehabilitation

## 2023-07-04 ENCOUNTER — Encounter (HOSPITAL_COMMUNITY): Payer: Self-pay | Admitting: Physical Medicine and Rehabilitation

## 2023-07-04 DIAGNOSIS — J9811 Atelectasis: Secondary | ICD-10-CM | POA: Diagnosis present

## 2023-07-04 DIAGNOSIS — K769 Liver disease, unspecified: Secondary | ICD-10-CM | POA: Diagnosis present

## 2023-07-04 DIAGNOSIS — F1011 Alcohol abuse, in remission: Secondary | ICD-10-CM | POA: Diagnosis present

## 2023-07-04 DIAGNOSIS — R1314 Dysphagia, pharyngoesophageal phase: Secondary | ICD-10-CM | POA: Diagnosis present

## 2023-07-04 DIAGNOSIS — G4700 Insomnia, unspecified: Secondary | ICD-10-CM | POA: Diagnosis not present

## 2023-07-04 DIAGNOSIS — D75838 Other thrombocytosis: Secondary | ICD-10-CM | POA: Diagnosis present

## 2023-07-04 DIAGNOSIS — Z880 Allergy status to penicillin: Secondary | ICD-10-CM

## 2023-07-04 DIAGNOSIS — S12100D Unspecified displaced fracture of second cervical vertebra, subsequent encounter for fracture with routine healing: Secondary | ICD-10-CM

## 2023-07-04 DIAGNOSIS — S069XAA Unspecified intracranial injury with loss of consciousness status unknown, initial encounter: Principal | ICD-10-CM | POA: Diagnosis present

## 2023-07-04 DIAGNOSIS — I82612 Acute embolism and thrombosis of superficial veins of left upper extremity: Secondary | ICD-10-CM | POA: Diagnosis not present

## 2023-07-04 DIAGNOSIS — Z981 Arthrodesis status: Secondary | ICD-10-CM

## 2023-07-04 DIAGNOSIS — R471 Dysarthria and anarthria: Secondary | ICD-10-CM | POA: Diagnosis present

## 2023-07-04 DIAGNOSIS — I10 Essential (primary) hypertension: Secondary | ICD-10-CM | POA: Diagnosis not present

## 2023-07-04 DIAGNOSIS — S2231XD Fracture of one rib, right side, subsequent encounter for fracture with routine healing: Secondary | ICD-10-CM

## 2023-07-04 DIAGNOSIS — Z800 Family history of malignant neoplasm of digestive organs: Secondary | ICD-10-CM

## 2023-07-04 DIAGNOSIS — M5416 Radiculopathy, lumbar region: Secondary | ICD-10-CM | POA: Diagnosis present

## 2023-07-04 DIAGNOSIS — E785 Hyperlipidemia, unspecified: Secondary | ICD-10-CM | POA: Diagnosis present

## 2023-07-04 DIAGNOSIS — S069X0S Unspecified intracranial injury without loss of consciousness, sequela: Secondary | ICD-10-CM

## 2023-07-04 DIAGNOSIS — N400 Enlarged prostate without lower urinary tract symptoms: Secondary | ICD-10-CM | POA: Diagnosis present

## 2023-07-04 DIAGNOSIS — S32059D Unspecified fracture of fifth lumbar vertebra, subsequent encounter for fracture with routine healing: Secondary | ICD-10-CM

## 2023-07-04 DIAGNOSIS — G40901 Epilepsy, unspecified, not intractable, with status epilepticus: Secondary | ICD-10-CM | POA: Diagnosis present

## 2023-07-04 DIAGNOSIS — Z9889 Other specified postprocedural states: Secondary | ICD-10-CM

## 2023-07-04 DIAGNOSIS — Z6824 Body mass index (BMI) 24.0-24.9, adult: Secondary | ICD-10-CM

## 2023-07-04 DIAGNOSIS — E871 Hypo-osmolality and hyponatremia: Secondary | ICD-10-CM | POA: Diagnosis not present

## 2023-07-04 DIAGNOSIS — Z7982 Long term (current) use of aspirin: Secondary | ICD-10-CM

## 2023-07-04 DIAGNOSIS — R7303 Prediabetes: Secondary | ICD-10-CM | POA: Diagnosis not present

## 2023-07-04 DIAGNOSIS — I609 Nontraumatic subarachnoid hemorrhage, unspecified: Secondary | ICD-10-CM

## 2023-07-04 DIAGNOSIS — S062XAD Diffuse traumatic brain injury with loss of consciousness status unknown, subsequent encounter: Secondary | ICD-10-CM

## 2023-07-04 DIAGNOSIS — M25571 Pain in right ankle and joints of right foot: Secondary | ICD-10-CM | POA: Diagnosis not present

## 2023-07-04 DIAGNOSIS — S069X9S Unspecified intracranial injury with loss of consciousness of unspecified duration, sequela: Secondary | ICD-10-CM | POA: Diagnosis not present

## 2023-07-04 DIAGNOSIS — S069X9A Unspecified intracranial injury with loss of consciousness of unspecified duration, initial encounter: Secondary | ICD-10-CM | POA: Diagnosis not present

## 2023-07-04 DIAGNOSIS — E440 Moderate protein-calorie malnutrition: Secondary | ICD-10-CM | POA: Diagnosis present

## 2023-07-04 DIAGNOSIS — F1411 Cocaine abuse, in remission: Secondary | ICD-10-CM | POA: Diagnosis present

## 2023-07-04 DIAGNOSIS — Z888 Allergy status to other drugs, medicaments and biological substances status: Secondary | ICD-10-CM

## 2023-07-04 DIAGNOSIS — M19071 Primary osteoarthritis, right ankle and foot: Secondary | ICD-10-CM | POA: Diagnosis not present

## 2023-07-04 DIAGNOSIS — S066XAD Traumatic subarachnoid hemorrhage with loss of consciousness status unknown, subsequent encounter: Secondary | ICD-10-CM | POA: Diagnosis not present

## 2023-07-04 DIAGNOSIS — Z602 Problems related to living alone: Secondary | ICD-10-CM | POA: Diagnosis present

## 2023-07-04 DIAGNOSIS — Z635 Disruption of family by separation and divorce: Secondary | ICD-10-CM

## 2023-07-04 DIAGNOSIS — D171 Benign lipomatous neoplasm of skin and subcutaneous tissue of trunk: Secondary | ICD-10-CM | POA: Diagnosis not present

## 2023-07-04 DIAGNOSIS — Z8673 Personal history of transient ischemic attack (TIA), and cerebral infarction without residual deficits: Secondary | ICD-10-CM

## 2023-07-04 DIAGNOSIS — S32049D Unspecified fracture of fourth lumbar vertebra, subsequent encounter for fracture with routine healing: Secondary | ICD-10-CM

## 2023-07-04 DIAGNOSIS — Z860101 Personal history of adenomatous and serrated colon polyps: Secondary | ICD-10-CM

## 2023-07-04 DIAGNOSIS — K7689 Other specified diseases of liver: Secondary | ICD-10-CM | POA: Diagnosis present

## 2023-07-04 DIAGNOSIS — D62 Acute posthemorrhagic anemia: Secondary | ICD-10-CM | POA: Diagnosis present

## 2023-07-04 DIAGNOSIS — S069XAS Unspecified intracranial injury with loss of consciousness status unknown, sequela: Secondary | ICD-10-CM | POA: Diagnosis not present

## 2023-07-04 DIAGNOSIS — Z87891 Personal history of nicotine dependence: Secondary | ICD-10-CM

## 2023-07-04 DIAGNOSIS — S069XAD Unspecified intracranial injury with loss of consciousness status unknown, subsequent encounter: Secondary | ICD-10-CM | POA: Diagnosis not present

## 2023-07-04 DIAGNOSIS — S32039D Unspecified fracture of third lumbar vertebra, subsequent encounter for fracture with routine healing: Secondary | ICD-10-CM

## 2023-07-04 DIAGNOSIS — S270XXD Traumatic pneumothorax, subsequent encounter: Secondary | ICD-10-CM

## 2023-07-04 DIAGNOSIS — Z79899 Other long term (current) drug therapy: Secondary | ICD-10-CM

## 2023-07-04 LAB — GLUCOSE, CAPILLARY
Glucose-Capillary: 116 mg/dL — ABNORMAL HIGH (ref 70–99)
Glucose-Capillary: 119 mg/dL — ABNORMAL HIGH (ref 70–99)

## 2023-07-04 MED ORDER — LORAZEPAM 2 MG/ML IJ SOLN: 1.0000 mg | INTRAMUSCULAR | Status: DC | PRN

## 2023-07-04 MED ORDER — FLEET ENEMA RE ENEM: 1.0000 | ENEMA | Freq: Once | RECTAL | Status: DC | PRN

## 2023-07-04 MED ORDER — OXYCODONE HCL 5 MG PO TABS
5.0000 mg | ORAL_TABLET | ORAL | Status: DC | PRN
  Administered 2023-07-05 – 2023-07-06 (×3): 5 mg via ORAL
  Filled 2023-07-04 (×3): qty 1

## 2023-07-04 MED ORDER — FREE WATER: 30.0000 mL | Status: DC

## 2023-07-04 MED ORDER — ENSURE ENLIVE PO LIQD
237.0000 mL | Freq: Two times a day (BID) | ORAL | Status: DC
Start: 2023-07-05 — End: 2023-07-15
  Administered 2023-07-05 – 2023-07-15 (×17): 237 mL via ORAL

## 2023-07-04 MED ORDER — PROCHLORPERAZINE MALEATE 5 MG PO TABS: 5.0000 mg | ORAL_TABLET | Freq: Four times a day (QID) | ORAL | Status: DC | PRN

## 2023-07-04 MED ORDER — PROCHLORPERAZINE EDISYLATE 10 MG/2ML IJ SOLN: 5.0000 mg | Freq: Four times a day (QID) | INTRAMUSCULAR | Status: DC | PRN

## 2023-07-04 MED ORDER — HYDRALAZINE HCL 10 MG PO TABS: 10.0000 mg | ORAL_TABLET | Freq: Four times a day (QID) | ORAL | Status: DC | PRN

## 2023-07-04 MED ORDER — TRAZODONE HCL 50 MG PO TABS
50.0000 mg | ORAL_TABLET | Freq: Every evening | ORAL | Status: DC | PRN
Start: 2023-07-04 — End: 2023-07-06

## 2023-07-04 MED ORDER — ALUM & MAG HYDROXIDE-SIMETH 200-200-20 MG/5ML PO SUSP
30.0000 mL | ORAL | Status: DC | PRN
  Administered 2023-07-07 – 2023-07-14 (×3): 30 mL via ORAL
  Filled 2023-07-04 (×3): qty 30

## 2023-07-04 MED ORDER — ENOXAPARIN SODIUM 30 MG/0.3ML IJ SOSY
30.0000 mg | PREFILLED_SYRINGE | Freq: Two times a day (BID) | INTRAMUSCULAR | Status: DC
Start: 2023-07-04 — End: 2023-07-15
  Administered 2023-07-04 – 2023-07-14 (×20): 30 mg via SUBCUTANEOUS
  Filled 2023-07-04 (×22): qty 0.3

## 2023-07-04 MED ORDER — GUAIFENESIN 100 MG/5ML PO LIQD
15.0000 mL | ORAL | Status: DC
  Administered 2023-07-04 – 2023-07-14 (×45): 15 mL via ORAL
  Filled 2023-07-04 (×49): qty 15

## 2023-07-04 MED ORDER — ACETAMINOPHEN 325 MG PO TABS
325.0000 mg | ORAL_TABLET | ORAL | Status: DC | PRN
Start: 2023-07-04 — End: 2023-07-06
  Administered 2023-07-04: 650 mg via ORAL
  Filled 2023-07-04: qty 2

## 2023-07-04 MED ORDER — VALPROIC ACID 250 MG/5ML PO SOLN
500.0000 mg | Freq: Two times a day (BID) | ORAL | Status: DC
Start: 2023-07-04 — End: 2023-07-13
  Administered 2023-07-04 – 2023-07-13 (×18): 500 mg via ORAL
  Filled 2023-07-04 (×18): qty 10

## 2023-07-04 MED ORDER — TOPIRAMATE 25 MG PO TABS
50.0000 mg | ORAL_TABLET | Freq: Every day | ORAL | Status: DC
  Administered 2023-07-04 – 2023-07-06 (×3): 50 mg via ORAL
  Filled 2023-07-04 (×3): qty 2

## 2023-07-04 MED ORDER — AMLODIPINE BESYLATE 10 MG PO TABS
10.0000 mg | ORAL_TABLET | Freq: Every day | ORAL | Status: DC
  Administered 2023-07-05 – 2023-07-15 (×11): 10 mg via ORAL
  Filled 2023-07-04 (×11): qty 1

## 2023-07-04 MED ORDER — DIAZEPAM 2 MG PO TABS
2.0000 mg | ORAL_TABLET | Freq: Two times a day (BID) | ORAL | Status: DC
  Administered 2023-07-04 – 2023-07-05 (×3): 2 mg via ORAL
  Filled 2023-07-04 (×3): qty 1

## 2023-07-04 MED ORDER — ORAL CARE MOUTH RINSE: 15.0000 mL | OROMUCOSAL | Status: DC | PRN

## 2023-07-04 MED ORDER — CARMEX CLASSIC LIP BALM EX OINT: TOPICAL_OINTMENT | CUTANEOUS | Status: DC | PRN

## 2023-07-04 MED ORDER — BISACODYL 10 MG RE SUPP: 10.0000 mg | Freq: Every day | RECTAL | Status: DC | PRN

## 2023-07-04 MED ORDER — DIPHENHYDRAMINE HCL 25 MG PO CAPS
25.0000 mg | ORAL_CAPSULE | Freq: Four times a day (QID) | ORAL | Status: DC | PRN
Start: 2023-07-04 — End: 2023-07-12

## 2023-07-04 MED ORDER — GUAIFENESIN-DM 100-10 MG/5ML PO SYRP: 5.0000 mL | ORAL_SOLUTION | Freq: Four times a day (QID) | ORAL | Status: DC | PRN

## 2023-07-04 MED ORDER — ORAL CARE MOUTH RINSE
15.0000 mL | OROMUCOSAL | Status: DC
  Administered 2023-07-04 – 2023-07-15 (×30): 15 mL via OROMUCOSAL

## 2023-07-04 MED ORDER — PROCHLORPERAZINE 25 MG RE SUPP: 12.5000 mg | Freq: Four times a day (QID) | RECTAL | Status: DC | PRN

## 2023-07-04 MED ORDER — GLYCOPYRROLATE 0.2 MG/ML IJ SOLN
0.2000 mg | Freq: Three times a day (TID) | INTRAMUSCULAR | Status: DC
  Administered 2023-07-04 – 2023-07-05 (×4): 0.2 mg via INTRAVENOUS
  Filled 2023-07-04 (×6): qty 1

## 2023-07-04 MED ORDER — TRIAMTERENE-HCTZ 37.5-25 MG PO TABS
1.0000 | ORAL_TABLET | Freq: Every day | ORAL | Status: DC
  Administered 2023-07-05 – 2023-07-15 (×11): 1 via ORAL
  Filled 2023-07-04 (×11): qty 1

## 2023-07-04 MED ORDER — TRAZODONE HCL 50 MG PO TABS
50.0000 mg | ORAL_TABLET | Freq: Every day | ORAL | Status: DC
  Administered 2023-07-04 – 2023-07-05 (×2): 50 mg via ORAL
  Filled 2023-07-04 (×2): qty 1

## 2023-07-04 MED ORDER — POLYETHYLENE GLYCOL 3350 17 G PO PACK
17.0000 g | PACK | Freq: Every day | ORAL | Status: DC
  Administered 2023-07-05 – 2023-07-14 (×10): 17 g via ORAL
  Filled 2023-07-04 (×11): qty 1

## 2023-07-04 MED ORDER — PIVOT 1.5 CAL PO LIQD
1000.0000 mL | ORAL | Status: DC
  Filled 2023-07-04: qty 1000

## 2023-07-04 NOTE — Plan of Care (Signed)
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
## 2023-07-04 NOTE — Plan of Care (Signed)
Patient alert/oriented X4 on admission. Patient compliant with medication administration and tylenol administered as needed for a headache. Oral suction set up at the bedside and skin assessed with [facility], RN. Abrasions noted on top of scalp, R elbow, and R knee. Also some drainage present on neck with tracheostomy removed. Patient complained of new onset depression, loss of appetite, loss of sleep, and showed signs of increased agitation. Chaplin services declined. VSS, will continue to monitor.   Problem: Consults Goal: RH BRAIN INJURY PATIENT EDUCATION Description: Description: See Patient Education module for eduction specifics Outcome: Progressing   Problem: RH BOWEL ELIMINATION Goal: RH STG MANAGE BOWEL WITH ASSISTANCE Description: STG Manage Bowel with Assistance. Outcome: Progressing   Problem: RH BOWEL ELIMINATION Goal: RH STG MANAGE BOWEL W/MEDICATION W/ASSISTANCE Description: STG Manage Bowel with Medication with Assistance. Outcome: Progressing   Problem: RH BLADDER ELIMINATION Goal: RH STG MANAGE BLADDER WITH ASSISTANCE Description: STG Manage Bladder With Assistance Outcome: Progressing   Problem: RH SKIN INTEGRITY Goal: RH STG SKIN FREE OF INFECTION/BREAKDOWN Outcome: Progressing   Problem: RH SAFETY Goal: RH STG ADHERE TO SAFETY PRECAUTIONS W/ASSISTANCE/DEVICE Description: STG Adhere to Safety Precautions With Assistance/Device. Outcome: Progressing   Problem: RH COGNITION-NURSING Goal: RH STG USES MEMORY AIDS/STRATEGIES W/ASSIST TO PROBLEM SOLVE Description: STG Uses Memory Aids/Strategies With Assistance to Problem Solve. Outcome: Progressing   Problem: RH PAIN MANAGEMENT Goal: RH STG PAIN MANAGED AT OR BELOW PT'S PAIN GOAL Outcome: Progressing   Problem: RH KNOWLEDGE DEFICIT BRAIN INJURY Goal: RH STG INCREASE KNOWLEDGE OF SELF CARE AFTER BRAIN INJURY Outcome: Progressing

## 2023-07-04 NOTE — TOC Transition Note (Signed)
Transition of Care [facility]) - Discharge Note   Patient Details  Name: [name] MRN: [id number] Date of Birth: 1955/10/19  Transition of Care [facility]) CM/SW Contact:  Glennon Mac, RN Phone Number: 07/04/2023, 1:56 PM   Clinical Narrative:    Bed available for patient on rehab unit today, and patient is medically stable. Plan dc to CIR when bed ready.    Final next level of care: IP Rehab Facility Barriers to Discharge: Barriers Resolved   Patient Goals and CMS Choice   CMS Medicare.gov Compare Post Acute Care list provided to:: Patient Represenative (must comment) Choice offered to / list presented to : Adult Children      Discharge Placement                       Discharge Plan and Services Additional resources added to the After Visit Summary for     Discharge Planning Services: CM Consult                                 Social Drivers of Health (SDOH) Interventions SDOH Screenings   Food Insecurity: Patient Unable To Answer (06/06/2023)  Housing: High Risk (06/06/2023)  Transportation Needs: Patient Unable To Answer (06/06/2023)  Utilities: Patient Unable To Answer (06/06/2023)  Alcohol Screen: Low Risk  (05/12/2022)  Depression (PHQ2-9): Low Risk  (05/12/2022)  Financial Resource Strain: Low Risk  (05/12/2022)  Physical Activity: Unknown (05/12/2022)  Social Connections: Moderately Isolated (05/12/2022)  Stress: No Stress Concern Present (05/12/2022)  Tobacco Use: Medium Risk (06/21/2023)     Readmission Risk Interventions     No data to display          Quintella Baton, RN, BSN  Trauma/Neuro ICU Case Manager [phone]

## 2023-07-04 NOTE — Progress Notes (Signed)
Inpatient Rehab Admissions Coordinator:    I have insurance approval and a bed available for pt to admit to CIR today. Leary Roca, PA-C, in agreement and [facility] aware.  I will let pt/family know.    Estill Dooms, PT, DPT Admissions Coordinator [phone] 07/04/23  9:46 AM

## 2023-07-04 NOTE — H&P (Incomplete)
Physical Medicine and Rehabilitation Admission H&P   Chief Complaint  Patient presents with   Trauma  : Functional deficits secondary to   HPI: ***  ROS Past Medical History:  Diagnosis Date   Arthritis    due to age   Back pain 05/22/2019   Cocaine abuse (HCC)    quit   ETOH abuse    quit   Headache    Hx of adenomatous polyp of colon    Hyperlipidemia    Hypertension    PONV (postoperative nausea and vomiting)    Right arm pain 05/29/2019   Stroke [facility])    posterior limb, R internal capsule stroke 07/2012   Past Surgical History:  Procedure Laterality Date   ANTERIOR CERVICAL DECOMPRESSION/DISCECTOMY FUSION 4 LEVELS N/A 02/18/2021   Procedure: ANTERIOR CERVICAL DECOMPRESSION FUSION CERVIAL 4- CERVICAL 5, CERVICAL 5- CERVICAL 6, CERVICAL 6- CERVICAL 7 WITH INSTRUMENTATION AND ALLOGRAFT;  Surgeon: Estill Bamberg, MD;  Location: [facility] OR;  Service: Orthopedics;  Laterality: N/A;   bone spur repair Left    COLONOSCOPY     HAND SURGERY     left hand   TRACHEOSTOMY TUBE PLACEMENT N/A 06/19/2023   Procedure: TRACHEOSTOMY;  Surgeon: Diamantina Monks, MD;  Location: [facility] OR;  Service: General;  Laterality: N/A;   Family History  Problem Relation Age of Onset   Pancreatic cancer Father    Colon cancer Neg Hx    Esophageal cancer Neg Hx    Rectal cancer Neg Hx    Stomach cancer Neg Hx    Social History:  reports that he quit smoking about 10 years ago. His smoking use included cigarettes. He has never used smokeless tobacco. He reports that he does not drink alcohol and does not use drugs. Allergies:  Allergies  Allergen Reactions   Ace Inhibitors Cough   Penicillins Hives and Rash   Medications Prior to Admission  Medication Sig Dispense Refill   amLODipine (NORVASC) 10 MG tablet Take 1 tablet (10 mg total) by mouth daily. 90 tablet 3   aspirin EC 81 MG tablet Take 81 mg by mouth daily. Swallow whole.     Cholecalciferol (VITAMIN D3) 50 MCG (2000 UT) CAPS Take 1 capsule by  mouth daily.     diclofenac (VOLTAREN) 75 MG EC tablet Take 75 mg by mouth 2 (two) times daily as needed.     methocarbamol (ROBAXIN) 500 MG tablet Take 500 mg by mouth every 8 (eight) hours as needed for muscle spasms.     Omega-3 Fatty Acids (FISH OIL) 1000 MG CAPS Take 1,000 mg by mouth daily.     rosuvastatin (CRESTOR) 20 MG tablet Take 1 tablet (20 mg total) by mouth daily. 90 tablet 3   triamterene-hydrochlorothiazide (DYAZIDE) 37.5-25 MG capsule Take 1 each (1 capsule total) by mouth daily. 90 capsule 1   docusate sodium (COLACE) 50 MG capsule Take 2 capsules (100 mg total) by mouth daily as needed for mild constipation. (Patient not taking: Reported on 05/30/2023) 10 capsule 1   gabapentin (NEURONTIN) 100 MG capsule Take 1 capsule (100 mg total) by mouth 3 (three) times daily as needed. (Patient not taking: Reported on 05/30/2023) 60 capsule 0   guaiFENesin-codeine (ROBITUSSIN AC) 100-10 MG/5ML syrup Take 5 mLs by mouth every 6 (six) hours as needed (cough). (Patient not taking: Reported on 05/30/2023) 473 mL 0   ipratropium (ATROVENT) 0.03 % nasal spray Place 2 sprays into both nostrils every 12 (twelve) hours. (Patient not taking: Reported on 05/30/2023)  30 mL 12   pantoprazole (PROTONIX) 40 MG tablet Take 1 tablet by mouth once daily (Patient not taking: Reported on 05/30/2023) 90 tablet 1   tadalafil (CIALIS) 20 MG tablet Take 1 tablet (20 mg total) by mouth daily as needed for erectile dysfunction. (Patient not taking: Reported on 05/30/2023) 30 tablet 2      Home: Home Living Family/patient expects to be discharged to:: Private residence Living Arrangements: Alone Available Help at Discharge: Family Type of Home: House Home Access: Stairs to enter Secretary/administrator of Steps: 2 Entrance Stairs-Rails: None Home Layout: One level Bathroom Shower/Tub: Engineer, manufacturing systems: Handicapped height Bathroom Accessibility: Yes Home Equipment: None Additional Comments:  mother provided home environment but very guarded about return to her home unless at certain levels of care. pt lived alone prior to injury  Lives With: Alone   Functional History: Prior Function Prior Level of Function : Independent/Modified Independent, Driving, Working/employed  Functional Status:  Mobility: Bed Mobility Overal bed mobility: Needs Assistance Bed Mobility: Sidelying to Sit, Rolling Rolling: Min assist Sidelying to sit: Min assist Supine to sit: Max assist, HOB elevated Sit to supine: Total assist, +2 for physical assistance General bed mobility comments: up in chair Transfers Overall transfer level: Needs assistance Equipment used: Rolling walker (2 wheels) Transfers: Sit to/from Stand Sit to Stand: Min assist Bed to/from chair/wheelchair/BSC transfer type:: Step pivot Stand pivot transfers: Mod assist, +2 safety/equipment (x2 to/from [facility]) Step pivot transfers: Min assist, +2 physical assistance Transfer via Lift Equipment: Stedy General transfer comment: assist for rise and steady, cues for hand placement. stand x2. Ambulation/Gait Ambulation/Gait assistance: Min assist Gait Distance (Feet): 100 Feet (+80 ft after seated rest break) Assistive device: Rolling walker (2 wheels) Gait Pattern/deviations: Step-through pattern, Decreased stride length, Trunk flexed General Gait Details: assist to steady and maneuver RW around obstacles and during turns. cues for upright posture, placement in RW, lengthening step length. Gait velocity: decr Gait velocity interpretation: <1.8 ft/sec, indicate of risk for recurrent falls    ADL: ADL Overall ADL's : Needs assistance/impaired Eating/Feeding: NPO Grooming: Minimal assistance, Sitting Grooming Details (indicate cue type and reason): static standing washing face and head Lower Body Dressing: Maximal assistance, Bed level Lower Body Dressing Details (indicate cue type and reason): put on socks Toilet Transfer:  Moderate assistance, Rolling walker (2 wheels) Toilet Transfer Details (indicate cue type and reason): cues for hand placement Toileting- Clothing Manipulation and Hygiene: Total assistance Functional mobility during ADLs: Minimal assistance, Rolling walker (2 wheels) General ADL Comments: pt while standing at the sink removing hands with posterior lob. pt descending quickly to [facility]. pt fatigued after standing for ~2 minutes static  Cognition: Cognition Overall Cognitive Status: Within Functional Limits for tasks assessed Arousal/Alertness: Awake/alert Orientation Level: Oriented X4 Attention: Sustained Sustained Attention: Impaired Behaviors: Restless [place] [employer] Scales of Cognitive Functioning: Automatic, Appropriate: Minimal Assistance for Daily Living Skills Cognition Arousal: Alert Behavior During Therapy: WFL for tasks assessed/performed Overall Cognitive Status: Within Functional Limits for tasks assessed General Comments: increased processing time Difficult to assess due to: Impaired communication  Physical Exam: Blood pressure (!) 142/89, pulse (!) 101, temperature 98.3 F (36.8 C), temperature source Oral, resp. rate 18, height 5' 7.99" (1.727 m), weight 67.7 kg, SpO2 96%. Physical Exam  Results for orders placed or performed during the hospital encounter of 05/29/23 (from the past 48 hours)  Glucose, capillary     Status: Abnormal   Collection Time: 07/02/23 11:45 AM  Result Value Ref Range  Glucose-Capillary 139 (H) 70 - 99 mg/dL    Comment: Glucose reference range applies only to samples taken after fasting for at least 8 hours.  Glucose, capillary     Status: Abnormal   Collection Time: 07/02/23  6:40 PM  Result Value Ref Range   Glucose-Capillary 129 (H) 70 - 99 mg/dL    Comment: Glucose reference range applies only to samples taken after fasting for at least 8 hours.  Glucose, capillary     Status: Abnormal   Collection Time: 07/02/23  7:20 PM  Result  Value Ref Range   Glucose-Capillary 123 (H) 70 - 99 mg/dL    Comment: Glucose reference range applies only to samples taken after fasting for at least 8 hours.  Glucose, capillary     Status: Abnormal   Collection Time: 07/02/23 11:27 PM  Result Value Ref Range   Glucose-Capillary 130 (H) 70 - 99 mg/dL    Comment: Glucose reference range applies only to samples taken after fasting for at least 8 hours.  Glucose, capillary     Status: Abnormal   Collection Time: 07/03/23  3:22 AM  Result Value Ref Range   Glucose-Capillary 123 (H) 70 - 99 mg/dL    Comment: Glucose reference range applies only to samples taken after fasting for at least 8 hours.  Glucose, capillary     Status: Abnormal   Collection Time: 07/03/23  7:49 AM  Result Value Ref Range   Glucose-Capillary 103 (H) 70 - 99 mg/dL    Comment: Glucose reference range applies only to samples taken after fasting for at least 8 hours.  Glucose, capillary     Status: Abnormal   Collection Time: 07/03/23 11:12 AM  Result Value Ref Range   Glucose-Capillary 131 (H) 70 - 99 mg/dL    Comment: Glucose reference range applies only to samples taken after fasting for at least 8 hours.  Glucose, capillary     Status: Abnormal   Collection Time: 07/03/23  4:11 PM  Result Value Ref Range   Glucose-Capillary 150 (H) 70 - 99 mg/dL    Comment: Glucose reference range applies only to samples taken after fasting for at least 8 hours.  Glucose, capillary     Status: Abnormal   Collection Time: 07/03/23 11:13 PM  Result Value Ref Range   Glucose-Capillary 122 (H) 70 - 99 mg/dL    Comment: Glucose reference range applies only to samples taken after fasting for at least 8 hours.  Glucose, capillary     Status: Abnormal   Collection Time: 07/04/23  3:29 AM  Result Value Ref Range   Glucose-Capillary 116 (H) 70 - 99 mg/dL    Comment: Glucose reference range applies only to samples taken after fasting for at least 8 hours.  Glucose, capillary      Status: Abnormal   Collection Time: 07/04/23  7:48 AM  Result Value Ref Range   Glucose-Capillary 119 (H) 70 - 99 mg/dL    Comment: Glucose reference range applies only to samples taken after fasting for at least 8 hours.   No results found.    Blood pressure (!) 142/89, pulse (!) 101, temperature 98.3 F (36.8 C), temperature source Oral, resp. rate 18, height 5' 7.99" (1.727 m), weight 67.7 kg, SpO2 96%.  Medical Problem List and Plan: 1. Functional deficits secondary to ***  -patient may *** shower  -ELOS/Goals: ***  2.  Antithrombotics: -DVT/anticoagulation:  Pharmaceutical: Lovenox 30 mg BID  -antiplatelet therapy: none  3. Pain Management:  Tylenol as needed  4. Mood/Behavior/Sleep: LCSW to evaluate and provide emotional support  -antipsychotic agents: n/a  5. Neuropsych/cognition: This patient *** capable of making decisions on *** own behalf.  6. Skin/Wound Care: Routine skin care checks   7. Fluids/Electrolytes/Nutrition: Routine Is and Os and follow-up chemistries  -continue tube feeds Pivot 1.5   8: Hypertension: monitor TID and prn  -continue amlodipine 10 mg daily  -triamterene-hydrochlorothiazide 37.5/25 mg daily  9:        ***  Milinda Antis, PA-C 07/04/2023

## 2023-07-04 NOTE — Progress Notes (Signed)
   07/04/23 1530  Hand-off documentation  Hand-off Given Given to Transfer Unit/facility ([facility])  Report given to (Full Name) Maisie Fus   Report given to [facility]. [facility] to transport pt. Pt's daughter notified about transfer.
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
## 2023-07-05 ENCOUNTER — Encounter (HOSPITAL_COMMUNITY): Payer: Self-pay | Admitting: Physical Medicine and Rehabilitation

## 2023-07-05 DIAGNOSIS — I609 Nontraumatic subarachnoid hemorrhage, unspecified: Secondary | ICD-10-CM | POA: Diagnosis not present

## 2023-07-05 DIAGNOSIS — S069X9S Unspecified intracranial injury with loss of consciousness of unspecified duration, sequela: Secondary | ICD-10-CM | POA: Diagnosis not present

## 2023-07-05 DIAGNOSIS — S069X9A Unspecified intracranial injury with loss of consciousness of unspecified duration, initial encounter: Secondary | ICD-10-CM

## 2023-07-05 LAB — CBC WITH DIFFERENTIAL/PLATELET
Abs Immature Granulocytes: 0.03 10*3/uL (ref 0.00–0.07)
Basophils Absolute: 0.1 10*3/uL (ref 0.0–0.1)
Basophils Relative: 1 %
Eosinophils Absolute: 0.2 10*3/uL (ref 0.0–0.5)
Eosinophils Relative: 4 %
HCT: 38.2 % — ABNORMAL LOW (ref 39.0–52.0)
Hemoglobin: 12.5 g/dL — ABNORMAL LOW (ref 13.0–17.0)
Immature Granulocytes: 1 %
Lymphocytes Relative: 20 %
Lymphs Abs: 1.1 10*3/uL (ref 0.7–4.0)
MCH: 28.3 pg (ref 26.0–34.0)
MCHC: 32.7 g/dL (ref 30.0–36.0)
MCV: 86.4 fL (ref 80.0–100.0)
Monocytes Absolute: 0.7 10*3/uL (ref 0.1–1.0)
Monocytes Relative: 13 %
Neutro Abs: 3.5 10*3/uL (ref 1.7–7.7)
Neutrophils Relative %: 61 %
Platelets: 363 10*3/uL (ref 150–400)
RBC: 4.42 MIL/uL (ref 4.22–5.81)
RDW: 15.1 % (ref 11.5–15.5)
WBC: 5.6 10*3/uL (ref 4.0–10.5)
nRBC: 0 % (ref 0.0–0.2)

## 2023-07-05 LAB — COMPREHENSIVE METABOLIC PANEL
ALT: 21 U/L (ref 0–44)
AST: 16 U/L (ref 15–41)
Albumin: 3 g/dL — ABNORMAL LOW (ref 3.5–5.0)
Alkaline Phosphatase: 95 U/L (ref 38–126)
Anion gap: 12 (ref 5–15)
BUN: 19 mg/dL (ref 8–23)
CO2: 28 mmol/L (ref 22–32)
Calcium: 9.5 mg/dL (ref 8.9–10.3)
Chloride: 92 mmol/L — ABNORMAL LOW (ref 98–111)
Creatinine, Ser: 0.83 mg/dL (ref 0.61–1.24)
GFR, Estimated: >60 mL/min (ref >60)
Glucose, Bld: 119 mg/dL — ABNORMAL HIGH (ref 70–99)
Potassium: 3.8 mmol/L (ref 3.5–5.1)
Sodium: 132 mmol/L — ABNORMAL LOW (ref 135–145)
Total Bilirubin: 0.6 mg/dL (ref <1.2)
Total Protein: 7.2 g/dL (ref 6.5–8.1)

## 2023-07-05 LAB — PREALBUMIN: Prealbumin: 31 mg/dL (ref 18–38)

## 2023-07-05 LAB — VALPROIC ACID LEVEL: Valproic Acid Lvl: 42 ug/mL — ABNORMAL LOW (ref 50.0–100.0)

## 2023-07-05 MED ORDER — ORAL CARE MOUTH RINSE: 15.0000 mL | OROMUCOSAL | Status: DC | PRN

## 2023-07-05 MED ORDER — ADULT MULTIVITAMIN W/MINERALS CH
1.0000 | ORAL_TABLET | Freq: Every day | ORAL | Status: DC
  Administered 2023-07-05 – 2023-07-15 (×11): 1 via ORAL
  Filled 2023-07-05 (×11): qty 1
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
## 2023-07-05 NOTE — Progress Notes (Signed)
Patient ID: [name], male   DOB: July 11, 1956, 67 y.o.   MRN: [id number]  [id number]- SW spoke with pt son [name] to introduce self, explain role, and discuss discharge process. He reports he will take off the first two weeks of work, and there will be support from his sisters and other family members to provide relief. SW informed will follow-up with updates after team conference.   Cecile Sheerer, MSW, LCSW Office: [phone] Cell: [phone] Fax: [phone]
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
## 2023-07-05 NOTE — Progress Notes (Signed)
Inpatient Rehabilitation Admission Medication Review by a Pharmacist  A complete drug regimen review was completed for this patient to identify any potential clinically significant medication issues.  High Risk Drug Classes Is patient taking? Indication by Medication  Antipsychotic Yes Compazine-N/V  Anticoagulant Yes Enoxaparin-VTE px  Antibiotic No   Opioid Yes Oxycodone-pain  Antiplatelet No   Hypoglycemics/insulin No   Vasoactive Medication Yes Amlodpine, Maxzide-HTN Hydralazine prn-HTN  Chemotherapy No   Other Yes Maalox-GERD Bisacodyl, Miralax-constipation Diphenydramine-itching Guaifenesin DM-cough Trazodone-sleep Diazepam-spasms Lorazepam-prn seizures Depakote-Seizures Glycopyrolate-secretions     Type of Medication Issue Identified Description of Issue Recommendation(s)  Drug Interaction(s) (clinically significant)     Duplicate Therapy     Allergy     No Medication Administration End Date     Incorrect Dose     Additional Drug Therapy Needed     Significant med changes from prior encounter (inform family/care partners about these prior to discharge).    Other  Prior to admission medications not resumed: Aspirin, Vitamin D, diclofenac, robaxin, omega 3.  Restart PTA meds when and if necessary during CIR admission or at time of discharge, if warranted.    Clinically significant medication issues were identified that warrant physician communication and completion of prescribed/recommended actions by midnight of the next day:  No  Name of provider notified for urgent issues identified:   Provider Method of Notification:     Pharmacist comments:   Time spent performing this drug regimen review (minutes):  25   Noah Delaine, RPh Clinical Pharmacist [place] Please utilize Amion for appropriate phone number to reach the unit pharmacist [facility]) 07/05/2023 10:41 AM

## 2023-07-05 NOTE — Progress Notes (Signed)
PROGRESS NOTE   Subjective/Complaints:  No events overnight. Vitals stable Last BM 12/17  ROS: Denies fevers, chills, N/V, abdominal pain, constipation, diarrhea, SOB, cough, chest pain, new weakness or paraesthesias.    Objective:   No results found. Recent Labs    07/05/23 0522  WBC 5.6  HGB 12.5*  HCT 38.2*  PLT 363   Recent Labs    07/05/23 0522  NA 132*  K 3.8  CL 92*  CO2 28  GLUCOSE 119*  BUN 19  CREATININE 0.83  CALCIUM 9.5    Intake/Output Summary (Last 24 hours) at 07/05/2023 1610 Last data filed at 07/04/2023 2222 Gross per 24 hour  Intake --  Output 1 ml  Net -1 ml        Physical Exam: Vital Signs Blood pressure 129/87, pulse 93, temperature 98.4 F (36.9 C), resp. rate 15, height 5\' 8"  (1.727 m), weight 69.2 kg, SpO2 96%. Constitutional: No apparent distress. Appropriate appearance for age.  HENT: No JVD. Neck Supple. Trachea midline. Atraumatic, normocephalic. + cervical collar Eyes: PERRLA. EOMI. Visual fields grossly intact.  Cardiovascular: RRR, no murmurs/rub/gallops. No Edema. Peripheral pulses 2+  Respiratory: Course b/l crackles *** Abdomen: + bowel sounds, normoactive. No distention or tenderness.  Skin: C/D/I. No apparent lesions. Healing scalp abrasions MSK:      No apparent deformity.      Strength: Antigravity and against resistance all4  extremities Deltoids 2+/5; otherwise 4-/5 in Ue's  in biceps, triceps, WE, worse on L; and grip- FA 3+/5 B/L LE's- 4+/5 in HF, KE, and KF, and DF and PF B/L  Neurologic exam:  Cognition: AAO to person, place, time and event. *** Language: Fluent, No substitutions or neoglisms. No dysarthria. Names 3/3 objects correctly. *** Memory: Recalls 3/3 objects at 5 minutes. No apparent deficits *** Insight: Good *** insight into current condition.  Mood: Pleasant affect, appropriate mood.  Sensation: Equal and intact in BL UE and Les.  *** Reflexes: 2+ in BL UE and LEs. Negative Hoffman's and babinski signs bilaterally. *** CN: 2-12 grossly intact. *** Coordination: No apparent tremors. No ataxia on FTN, HTS bilaterally. *** Spasticity: MAS 0 in all extremities. *** Gait: Not observed ***        Assessment/Plan: 1. Functional deficits which require 3+ hours per day of interdisciplinary therapy in a comprehensive inpatient rehab setting. Physiatrist is providing close team supervision and 24 hour management of active medical problems listed below. Physiatrist and rehab team continue to assess barriers to discharge/monitor patient progress toward functional and medical goals  Care Tool:  Bathing              Bathing assist       Upper Body Dressing/Undressing Upper body dressing        Upper body assist      Lower Body Dressing/Undressing Lower body dressing            Lower body assist       Toileting Toileting    Toileting assist       Transfers Chair/bed transfer  Transfers assist           Locomotion Ambulation   Ambulation assist  Walk 10 feet activity   Assist           Walk 50 feet activity   Assist           Walk 150 feet activity   Assist           Walk 10 feet on uneven surface  activity   Assist           Wheelchair     Assist               Wheelchair 50 feet with 2 turns activity    Assist            Wheelchair 150 feet activity     Assist          Blood pressure 129/87, pulse 93, temperature 98.4 F (36.9 C), resp. rate 15, height 5\' 8"  (1.727 m), weight 69.2 kg, SpO2 96%.  1. Functional deficits secondary to TBI- [place] with SAH             -patient may  shower             -ELOS/Goals: 10-14 days- supervisions  - stable to continue CIR  2.  Antithrombotics: -DVT/anticoagulation:  Pharmaceutical: Lovenox             -antiplatelet therapy: N/A 3. New onset  headaches/Pain Management: Oxycodone and tylenol prn are effective when administered.              --Question addition of gabapentin for HA/Pain. Topamax added today for daily HA's. . 12/18: ***   4. Mood/Behavior/Sleep: LCSW to follow for evaluation and support.              -antipsychotic agents:  N/A             --valium being weaned off--has been on 2 mg bid since 12/14. Continue Trazodone 50 mg  5. Neuropsych/cognition: This patient is not fully capable of making decisions on his own behalf. 6. Skin/Wound Care: Monitor scalp abrasions which are healing well.  7. Fluids/Electrolytes/Nutrition: Monitor I/O. Intake reported to be reasonable  - 12/18: Mild hyponatremia 132; repeat in AM to trend .  8.  HTN: BP trending up. Amlodipine and Maxide resumed 12/17    - stable    07/05/2023    5:20 AM 07/05/2023    5:00 AM 07/04/2023    7:20 PM  Vitals with BMI  Weight  152 lbs 9 oz   BMI  23.2   Systolic 129  141  Diastolic 87  92  Pulse 93  97    9. 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    - 12/18: VPA level low; will recheck with neurology regarding dose adjustment  10. Superficial vein thrombosis Left cephalic vein: Managed with local measures and pain/edema has resolved.  11. Reactive thrombocytosis: Continue to monitor. On Lovenox bid.     - 2/18 normalized/resolved  12. Anemia: Likely due to critical illness and stable around 9 for the past month --Recheck CBC in am--HgB 12.5; prior 9.5; recheck h/h in AM  13. Pre-diabetes: Hgb A1C- 6.2 last year and has been stable since 2014.  14. BPH: Urinary retention has resolved -->passed voiding trail on 12/4. Monitor I/O.    - continent  15. Remote H/o Polysubstance abuse: Has been sober/off drugs since 2015.  16. L3-L5 TVP Fx/ H/o Lumbar radiculopathy: Has been on gabapentin in the past (  Dr. Maurice Small)   17. Resting tachycardia: Monitor for symptoms with  increase in activity.    - HR WNL overnight; monitor  18. Liver cysts?: Needs follow up ultrasound in 2 months.  19. Dysphagia with D1 thin diet, however sounds gurgly and needs to clear throat almost constantly- might need to have swallow reassessed?   - SLP consult placed 20. Rhonchorous lung sounds.  - CXR 12/2:  Lung volumes are slightly low. Linear opacity in the right mid lung and in the left lung base, favored to reflect subsegmental atelectasis. No acute consolidative airspace disease. No definite pleural effusions. - Has robinul IV TID and PRN robitussin - Incentive spirometry Q4H while awake - Sats stable on RA   LOS: 1 days A FACE TO FACE EVALUATION WAS PERFORMED  Angelina Sheriff 07/05/2023, 8:07 AM

## 2023-07-05 NOTE — Plan of Care (Signed)
  Problem: RH Swallowing Goal: LTG Patient will consume least restrictive diet using compensatory strategies with assistance (SLP) Description: LTG:  Patient will consume least restrictive diet using compensatory strategies with assistance (SLP) Flowsheets (Taken 07/05/2023 1235) LTG: Pt Patient will consume least restrictive diet using compensatory strategies with assistance of (SLP): Minimal Assistance - Patient > 75%   Problem: RH Expression Communication Goal: LTG Patient will increase speech intelligibility (SLP) Description: LTG: Patient will increase speech intelligibility at word/phrase/conversation level with cues, % of the time (SLP) Flowsheets (Taken 07/05/2023 1235) LTG: Patient will increase speech intelligibility (SLP): Minimal Assistance - Patient > 75% Level: Phrase Percent of time patient will use intelligible speech: 75%   Problem: RH Problem Solving Goal: LTG Patient will demonstrate problem solving for (SLP) Description: LTG:  Patient will demonstrate problem solving for basic/complex daily situations with cues  (SLP) Flowsheets (Taken 07/05/2023 1235) LTG: Patient will demonstrate problem solving for (SLP): Basic daily situations LTG Patient will demonstrate problem solving for: Minimal Assistance - Patient > 75%   Problem: RH Attention Goal: LTG Patient will demonstrate this level of attention during functional activites (SLP) Description: LTG:  Patient will will demonstrate this level of attention during functional activites (SLP) Flowsheets (Taken 07/05/2023 1235) Patient will demonstrate during cognitive/linguistic activities the attention type of: Sustained Patient will demonstrate this level of attention during cognitive/linguistic activities in: Controlled LTG: Patient will demonstrate this level of attention during cognitive/linguistic activities with assistance of (SLP): Minimal Assistance - Patient > 75% Number of minutes patient will demonstrate attention  during cognitive/linguistic activities: 15   Problem: RH Awareness Goal: LTG: Patient will demonstrate awareness during functional activites type of (SLP) Description: LTG: Patient will demonstrate awareness during functional activites type of (SLP) Flowsheets (Taken 07/05/2023 1235) Patient will demonstrate during cognitive/linguistic activities awareness type of: Intellectual LTG: Patient will demonstrate awareness during cognitive/linguistic activities with assistance of (SLP): Minimal Assistance - Patient > 75%
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
## 2023-07-05 NOTE — Consult Note (Addendum)
WOC Nurse Consult Note: Reason for Consult: Consult requested for previous trach site.  Performed remotely after review of progress notes and photo in the EMR. Full thickness wound where previous trach was located to the base of the neck is yellow and moist with mod amt drainage. 1.5X1cm, according to bedside nurses' wound care flow sheet. Topical treatment orders provided for bedside nurses to perform as follows to absorb drainage and provide antimicrobial benefits: Cut piece of Aquacel [place] # [phone]) and tuck into previous trach site Q day, then cover with foam dressing.  Change foam dressing Q 3 days or PRN soiling.  Moisten previous Aquacel with NS to assist with removal. Please re-consult if further assistance is needed.  Thank-you,  Cammie Mcgee MSN, RN, CWOCN, [place], CNS [phone]
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
## 2023-07-05 NOTE — Plan of Care (Signed)
  Problem: Consults Goal: RH BRAIN INJURY PATIENT EDUCATION Description: Description: See Patient Education module for eduction specifics Outcome: Progressing   Problem: RH BOWEL ELIMINATION Goal: RH STG MANAGE BOWEL WITH ASSISTANCE Description: STG Manage Bowel with Assistance. Outcome: Progressing   Problem: RH SKIN INTEGRITY Goal: RH STG SKIN FREE OF INFECTION/BREAKDOWN Outcome: Progressing   Problem: RH SAFETY Goal: RH STG ADHERE TO SAFETY PRECAUTIONS W/ASSISTANCE/DEVICE Description: STG Adhere to Safety Precautions With Assistance/Device. Outcome: Progressing   Problem: RH PAIN MANAGEMENT Goal: RH STG PAIN MANAGED AT OR BELOW PT'S PAIN GOAL Outcome: Progressing

## 2023-07-05 NOTE — Progress Notes (Signed)
Inpatient Rehabilitation  Patient information reviewed and entered into eRehab system by Oyuki Hogan M. Eri Mcevers, M.A., CCC/SLP, PPS Coordinator.  Information including medical coding, functional ability and quality indicators will be reviewed and updated through discharge.    

## 2023-07-06 ENCOUNTER — Inpatient Hospital Stay (HOSPITAL_COMMUNITY): Payer: Medicare Other

## 2023-07-06 DIAGNOSIS — S069X9S Unspecified intracranial injury with loss of consciousness of unspecified duration, sequela: Secondary | ICD-10-CM | POA: Diagnosis not present

## 2023-07-06 DIAGNOSIS — I609 Nontraumatic subarachnoid hemorrhage, unspecified: Secondary | ICD-10-CM

## 2023-07-06 LAB — BASIC METABOLIC PANEL
Anion gap: 8 (ref 5–15)
BUN: 17 mg/dL (ref 8–23)
CO2: 28 mmol/L (ref 22–32)
Calcium: 9.4 mg/dL (ref 8.9–10.3)
Chloride: 94 mmol/L — ABNORMAL LOW (ref 98–111)
Creatinine, Ser: 0.83 mg/dL (ref 0.61–1.24)
GFR, Estimated: >60 mL/min (ref >60)
Glucose, Bld: 116 mg/dL — ABNORMAL HIGH (ref 70–99)
Potassium: 3.8 mmol/L (ref 3.5–5.1)
Sodium: 130 mmol/L — ABNORMAL LOW (ref 135–145)

## 2023-07-06 LAB — HEMOGLOBIN AND HEMATOCRIT, BLOOD
HCT: 38.4 % — ABNORMAL LOW (ref 39.0–52.0)
Hemoglobin: 12.5 g/dL — ABNORMAL LOW (ref 13.0–17.0)

## 2023-07-06 LAB — GLUCOSE, CAPILLARY: Glucose-Capillary: 117 mg/dL — ABNORMAL HIGH (ref 70–99)

## 2023-07-06 MED ORDER — OXYCODONE HCL 5 MG PO TABS
5.0000 mg | ORAL_TABLET | ORAL | Status: DC | PRN
  Administered 2023-07-07 – 2023-07-10 (×5): 5 mg via ORAL
  Filled 2023-07-06 (×5): qty 1

## 2023-07-06 MED ORDER — DIAZEPAM 2 MG PO TABS
1.0000 mg | ORAL_TABLET | Freq: Two times a day (BID) | ORAL | Status: AC
  Administered 2023-07-06 – 2023-07-08 (×6): 1 mg via ORAL
  Filled 2023-07-06 (×6): qty 1

## 2023-07-06 MED ORDER — ACETAMINOPHEN 500 MG PO TABS
1000.0000 mg | ORAL_TABLET | Freq: Three times a day (TID) | ORAL | Status: DC
  Administered 2023-07-06 – 2023-07-10 (×13): 1000 mg via ORAL
  Filled 2023-07-06 (×14): qty 2

## 2023-07-06 MED ORDER — CARBAMIDE PEROXIDE 6.5 % OT SOLN
5.0000 [drp] | Freq: Two times a day (BID) | OTIC | Status: AC
  Administered 2023-07-06 – 2023-07-08 (×6): 5 [drp] via OTIC
  Filled 2023-07-06: qty 15

## 2023-07-06 MED ORDER — DICLOFENAC SODIUM 1 % EX GEL
2.0000 g | Freq: Four times a day (QID) | CUTANEOUS | Status: DC
  Administered 2023-07-06 – 2023-07-15 (×30): 2 g via TOPICAL
  Filled 2023-07-06 (×2): qty 100

## 2023-07-06 MED ORDER — LORAZEPAM 2 MG/ML IJ SOLN: 1.0000 mg | INTRAMUSCULAR | Status: DC | PRN

## 2023-07-06 MED ORDER — GLYCOPYRROLATE 1 MG PO TABS
1.0000 mg | ORAL_TABLET | Freq: Two times a day (BID) | ORAL | Status: DC
  Administered 2023-07-06: 1 mg via ORAL
  Filled 2023-07-06: qty 1

## 2023-07-06 NOTE — Progress Notes (Signed)
PROGRESS NOTE   Subjective/Complaints:  No events overnight.  Daughters report discomfort with his IV this a.m., requesting discontinuation.  Only IV medication Robinul, breathing much improved so we will DC this medication and removed.  Patient also complaining of his ears feeling "stuffed up".  No decreased hearing, vertigo, fevers, chills.  PT reporting patient very limited by pain in his right ankle.  Patient states he thinks this is because he is no longer wearing his work boots.  Does not provide any information on duration of pain, but denies acute injury.  Reporting excellent sleep with trazodone scheduled  Vitals stable  ROS: + Right ankle pain , + ear fullness , + poor sleep -improved .denies fevers, chills, N/V, abdominal pain, constipation, diarrhea, SOB, cough, chest pain, new weakness or paraesthesias.    Objective:   No results found. Recent Labs    07/05/23 0522 07/06/23 0551  WBC 5.6  --   HGB 12.5* 12.5*  HCT 38.2* 38.4*  PLT 363  --    Recent Labs    07/05/23 0522 07/06/23 0551  NA 132* 130*  K 3.8 3.8  CL 92* 94*  CO2 28 28  GLUCOSE 119* 116*  BUN 19 17  CREATININE 0.83 0.83  CALCIUM 9.5 9.4    Intake/Output Summary (Last 24 hours) at 07/06/2023 1511 Last data filed at 07/06/2023 0800 Gross per 24 hour  Intake 477 ml  Output --  Net 477 ml        Physical Exam: Vital Signs Blood pressure 122/71, pulse 91, temperature 98 F (36.7 C), resp. rate 17, height 5\' 8"  (1.727 m), weight 69.2 kg, SpO2 97%. Constitutional: No apparent distress. Appropriate appearance for age.  Laying in bed. HENT: PERRLA, EOMI.  + cervical collar On otoscopic exam, right ear obscured by large amount of wax.  Left ear with less wax, some mild erythema but no gross bulging of the tympanic membrane or effusion.  Cardiovascular: RRR, no murmurs/rub/gallops.  Bilateral edema. Peripheral pulses 2+  Respiratory:  Mild coarse central lung sounds, rhonchi on auscultation resolved.  No respiratory distress, on room air.  No cough. Abdomen: + bowel sounds, hypo-oactive. No distention or tenderness.  Skin: C/D/I. No apparent lesions. Healing scalp abrasions  MSK:      No apparent deformity.      Strength: Antigravity and against resistance all 4  extremities in bed, gross are equal bilaterally  4 out of 5 throughout bilateral upper and lower extremities.  Neurologic exam:  Cognition: AAO to person, place, time and event.  Can follow simple commands. + Concentration and memory deficits + Higher level cognitive deficits  Language: Fluent, mildly dysarthric.  Insight: Limited insight into current condition.  Mood: Pleasant affect, appropriate mood.  Sensation: Equal and intact in BL UE and Les.  Reflexes: 2+ in BL UE and LEs. Negative Hoffman's and babinski signs bilaterally.  CN: 2-12 grossly intact.  Coordination: No apparent tremors.  Spasticity: MAS 0 in all extremities.         Assessment/Plan: 1. Functional deficits which require 3+ hours per day of interdisciplinary therapy in a comprehensive inpatient rehab setting. Physiatrist is providing close team supervision and 24  hour management of active medical problems listed below. Physiatrist and rehab team continue to assess barriers to discharge/monitor patient progress toward functional and medical goals  Care Tool:  Bathing    Body parts bathed by patient: Right arm, Left arm, Chest, Abdomen, Front perineal area, Buttocks, Right upper leg, Left upper leg, Right lower leg, Left lower leg, Face         Bathing assist Assist Level: Minimal Assistance - Patient > 75%     Upper Body Dressing/Undressing Upper body dressing   What is the patient wearing?: Pull over shirt    Upper body assist Assist Level: Moderate Assistance - Patient 50 - 74%    Lower Body Dressing/Undressing Lower body dressing      What is the patient  wearing?: Underwear/pull up, Pants     Lower body assist Assist for lower body dressing: Minimal Assistance - Patient > 75%     Toileting Toileting    Toileting assist Assist for toileting: Minimal Assistance - Patient > 75%     Transfers Chair/bed transfer  Transfers assist     Chair/bed transfer assist level: Minimal Assistance - Patient > 75%     Locomotion Ambulation   Ambulation assist      Assist level: Minimal Assistance - Patient > 75% Assistive device: No Device Max distance: 140'   Walk 10 feet activity   Assist     Assist level: Minimal Assistance - Patient > 75%     Walk 50 feet activity   Assist    Assist level: Minimal Assistance - Patient > 75%      Walk 150 feet activity   Assist Walk 150 feet activity did not occur: Safety/medical concerns (Due to fatigue)         Walk 10 feet on uneven surface  activity   Assist     Assist level: Minimal Assistance - Patient > 75%     Wheelchair     Assist Is the patient using a wheelchair?: Yes Type of Wheelchair: Manual    Wheelchair assist level: Dependent - Patient 0% Max wheelchair distance: 150'    Wheelchair 50 feet with 2 turns activity    Assist        Assist Level: Dependent - Patient 0%   Wheelchair 150 feet activity     Assist      Assist Level: Dependent - Patient 0%   Blood pressure 122/71, pulse 91, temperature 98 F (36.7 C), resp. rate 17, height 5\' 8"  (1.727 m), weight 69.2 kg, SpO2 97%.  1. Functional deficits secondary to TBI- Ranchos VII with SAH             -patient may  shower             -ELOS/Goals: 10-14 days- supervisions  - stable to continue CIR  2.  Antithrombotics: -DVT/anticoagulation:  Pharmaceutical: Lovenox             -antiplatelet therapy: N/A 3. New onset headaches/Pain Management: Oxycodone and tylenol prn are effective when administered.              --Question addition of gabapentin for HA/Pain. Topamax added  today for daily HA's. . 12/18: No headaches endorsed overnight, continue current regimen 12/19: Headaches increasingly intermittent, well-controlled on current management.  May move Topamax to as needed tomorrow if continues to improve.  Scheduled Tylenol 1000 mg 3 times daily, and reduce oxycodone to 5 mg every 4 hours as needed.   4. Mood/Behavior/Sleep: LCSW  to follow for evaluation and support.              -antipsychotic agents:  N/A             --valium being weaned off--has been on 2 mg bid since 12/14. Continue Trazodone 50 mg  12/18: Reduce Valium to 1 mg twice daily tomorrow a.m. for 3 days, then DC  12/19: Doing well with scheduled trazodone  5. Neuropsych/cognition: This patient is not fully capable of making decisions on his own behalf.  -Soil scientist for safety, continue today  12-19: No impulsive behaviors per staff or family; will monitor 1 additional day, then if remains stable will DC tele sitter  6. Skin/Wound Care: Monitor scalp abrasions which are healing well.  7. Fluids/Electrolytes/Nutrition: Monitor I/O. Intake reported to be reasonable  - 12/18: Mild hyponatremia 132; repeat in AM to trend . -12-19: 130 today, trend again in a.m. and if less will place on fluid restriction and remove trazodone.  8.  HTN: BP trending up. Amlodipine and Maxide resumed 12/17    - stable on current medication regimen    07/06/2023    5:27 AM 07/05/2023    7:31 PM 07/05/2023    2:19 PM  Vitals with BMI  Systolic 122 104 478  Diastolic 71 85 98  Pulse 91 100 103    9. 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    - 12/18: VPA level mildly low, no seizures this admission, management per neurology  10. Superficial vein thrombosis Left cephalic vein: Managed with local measures and pain/edema has resolved.    11. Reactive thrombocytosis: Continue to monitor. On Lovenox bid.     - 2/18  normalized/resolved  12. Anemia: Likely due to critical illness and stable around 9 for the past month --Recheck CBC in am--HgB 12.5; prior 9.5; recheck h/h in AM  13. Pre-diabetes: Hgb A1C- 6.2 last year and has been stable since 2014.  14. BPH: Urinary retention has resolved -->passed voiding trail on 12/4. Monitor I/O.    - continent  15. Remote H/o Polysubstance abuse: Has been sober/off drugs since 2015.  16. L3-L5 TVP Fx/ H/o Lumbar radiculopathy: Has been on gabapentin in the past (Dr. Maurice Small)   17. Resting tachycardia: Monitor for symptoms with increase in activity.    - HR WNL overnight; monitor  18. Liver cysts?: Needs follow up ultrasound in 2 months.  19. Dysphagia with D1 thin diet, however sounds gurgly and needs to clear throat almost constantly- might need to have swallow reassessed?   - SLP consult placed  20. Rhonchorous lung sounds.  - CXR 12/2:  Lung volumes are slightly low. Linear opacity in the right mid lung and in the left lung base, favored to reflect subsegmental atelectasis. No acute consolidative airspace disease. No definite pleural effusions. - Has robinul IV TID and PRN robitussin - Incentive spirometry Q4H while awake - Sats stable on RA -12/19: DC Robinul.   LOS: 2 days A FACE TO FACE EVALUATION WAS PERFORMED  Angelina Sheriff 07/06/2023, 3:11 PM

## 2023-07-06 NOTE — Plan of Care (Signed)
   07/06/23 0700  Behavioral Plan Guideline  Rancho Level VII-Automatic, Appropriate (oriented x4)  Behavior to decrease/eliminate Increase arousal  Changes to environment Lights on, blinds open during the day, off and closed at night  Interventions # of rails up  # of rails up 3  Recommendations for interactions with patient Remain calm and reduce environment stimulation with agitation  In Attendance at Behavior Plan Meeting  PT;SLP;RN

## 2023-07-06 NOTE — Progress Notes (Signed)
Occupational Therapy Session Note  Patient Details  Name: [name] MRN: [id number] Date of Birth: [birth year]  Today's Date: 07/06/2023 OT Individual Time: 0865-7846 OT Individual Time Calculation (min): 37 min    Short Term Goals: Week 1:  OT Short Term Goal 1 (Week 1): STG=LTG d/t ELOS  Skilled Therapeutic Interventions/Progress Updates:  Pt greeted seated in w/c with pts daughters present, pt agreeable to OT intervention.      Transfers/bed mobility: pt completed all functional ambulation with no AD and CGA.   Therapeutic activity: pt completed functional ambulation task with an emphasis on walking ~ 10 ft to transport items from one surface to another, added increased challenge  by having pt dual process by stating months of year backwards. Pt unable to alternate attention needing to stop to think about next month. Overall, pt completed task with CGA. Pt needed 2 seated rest breaks during task d/t fatigue.   Attempted to complete dynamic balnace task on airex cusion however once pt stepped up on cushion pt reports pain from stomach and head needing to sit back down. Once pt seated EOM, pt then reports need to lay down on mat, assisted pt with supine>sit with MODA to lower trunk.   Pt completed stand pivot back to w/c with no AD and CGA. Total A transport back to room where pt completed additional stand pivot back to bed. Assisted pt back to supine with MODA. Alerted nurse and MD of pain in head. Daughters feel like oxy is making pt have an increased HA, alerted MD and nurse to this concern.              Ended session with pt supine in bed with all needs within reach and bed alarm activated.  Pt missed 8 mins d/t pain            Therapy Documentation Precautions:  Precautions Precautions: Fall Precaution Comments: C-collar at all times Required Braces or Orthoses: Cervical Brace Cervical Brace: At all times, Hard collar Restrictions Weight Bearing Restrictions Per  Provider Order: No  Pain:unrated pain reported in head and stomach, rest breaks provided     Therapy/Group: Individual Therapy  Barron Schmid 07/06/2023, 12:32 PM

## 2023-07-06 NOTE — Progress Notes (Signed)
Patient ID: [name], male   DOB: [birth year], 67 y.o.   MRN: [id number]  *SW went by pt room to leave statement of service but pt sleeping in room. SW informed his daughters on ELOS and will follow-up with their brother [name].   1132- SW spoke with pt son [name] to inform on ELOS. SW waiting on follow-up to discuss family edu.   1220- SW confirmed family edu on Monday (12/23) 9am-12pm.  Cecile Sheerer, MSW, LCSW Office: [phone] Cell: [phone] Fax: [phone]

## 2023-07-06 NOTE — Progress Notes (Signed)
Physical Therapy Session Note  Patient Details  Name: [name] MRN: [id number] Date of Birth: 01-Mar-1956  Today's Date: 07/06/2023 PT Individual Time: 1135-1200 PT Individual Time Calculation (min): 25 min   Short Term Goals: Week 1:  PT Short Term Goal 1 (Week 1): STGs = LTGs  Skilled Therapeutic Interventions/Progress Updates: Patient supine in bed with family present on entrance to room. Patient alert and agreeable to PT session.   Patient reported mild feelings of "in and out" with R ankle (not painful now due to medication). Pt family with questions pertaining to pt's d/c, conference, therapy follow up after d/c, family and CLOF. PTA discussed when conference is held with care team, typical follow-up (OPPT, HHPT), and that family education planned in the coming days will go further into care for pt (also discussed pt's d/c date is dependent on several factors including LOA, assistance family can provide at home, medical etc) with family understanding. PTA also encouraged family to discuss certain questions to attending rehab team as they can provide further clarification.  Pt performed supine<>sit with VC to perform log roll technique, and with CGA/very light minA after pt used B UE accordingly to elevate trunk to sit EOB.  Pt performed standing transfers to RW with CGA for safety. VC required for hand placement (use of RW to decrease WB on R ankle as pt reported pain). Pt ambulated from EOB to toilet with CGA. Pt stood at toilet with supervision to void bladder. Pt ambulated to [facility] to be transported to day room gym dependently in [facility] for energy conservation. Pt ambulated 250'+ around nsg and day room loop x 1 and then back to room with CGA/supervision with PTA following in [facility] for safety due to pt reporting "less confident" this session with standing. Pt with VC to increase step clearance on L LE (mod cues).   Patient supine in bed at end of session with brakes locked, family present,  bed alarm set, and all needs within reach.      Therapy Documentation Precautions:  Precautions Precautions: Fall Precaution Comments: C-collar at all times Required Braces or Orthoses: Cervical Brace Cervical Brace: At all times, Hard collar Restrictions Weight Bearing Restrictions Per Provider Order: No   Therapy/Group: Individual Therapy  Resa Rinks PTA 07/06/2023, 12:18 PM

## 2023-07-06 NOTE — Progress Notes (Signed)
Physical Therapy Session Note  Patient Details  Name: [name] MRN: [id number] Date of Birth: August 30, 1955  Today's Date: 07/06/2023 PT Individual Time: 0816-0859 PT Individual Time Calculation (min): 43 min   Today's Date: 07/06/2023 PT Individual Time: 4098-1191 PT Individual Time Calculation (min): 42 min   Short Term Goals: Week 1:  PT Short Term Goal 1 (Week 1): STGs = LTGs  Skilled Therapeutic Interventions/Progress Updates:     Pt received supine in bed and agrees to therapy. Reports pain in Rt ankle. Number not provided. RN informed and provides pt with pain medicine at initiation of session. PT also provides rest breaks as needed to manage pain. Supine to sit with bed features and cues for positioning. PT provides totalA to don shoes and pt performs stand step transfer to [facility] with minA and cues for posture and sequencing. WC transport to gym. Following seated rest break, pt stands with CGA and cues for hand placement, then ambulates x175' with minA at trunk and cues for upright posture and increasing bilateral stride length L>R to improve balance. WC transport back to room. Pt left seated in WC with alarm intact and all needs within reach.   2nd Session: Pt received supine in bed and agrees to therapy. No complaint of pain. Pt performs supine to sit with cues for positioning. PT provides totalA to don shoes and pt performs stand step to [facility] with minA and cues for sequencing. WC transport to gym and pt transfers to Nustep with same assistance. Pt completes Nustep for endurance training and reciprocal coordination. Pt completes x10:00 at workload of 5 with average steps per minute ~35. PT provides cues for hand and foot placement and completing full available ROM. Following rest break, pt ambulates x350' with minA at hips and trunk, with facilitation of lateral weight shifting and stability. Pt initially has shortened stance time on Rt but with increased distance, stride lengths begin to  equalize. WC transport back to room. Pt left seated in WC with alarm intact and all needs within reach.   Therapy Documentation Precautions:  Precautions Precautions: Fall Precaution Comments: C-collar at all times Required Braces or Orthoses: Cervical Brace Cervical Brace: At all times, Hard collar Restrictions Weight Bearing Restrictions Per Provider Order: No    Therapy/Group: Individual Therapy  Beau Fanny, PT, DPT 07/06/2023, 3:54 PM

## 2023-07-06 NOTE — Progress Notes (Signed)
Speech Language Pathology Daily Session Note  Patient Details  Name: [name] MRN: [id number] Date of Birth: 1955-08-11  Today's Date: 07/06/2023 SLP Individual Time: 1430-1526 SLP Individual Time Calculation (min): 56 min  Short Term Goals: Week 1: SLP Short Term Goal 1 (Week 1): STGs=LTGs d/t ELOS  Skilled Therapeutic Interventions: SLP conducted skilled therapy session targeting cognitive retraining and dysphagia management goals. Patient exhibits significantly impaired sustained attention, requiring max assist to attend to therapy tasks and often requesting to switch tasks due to difficulty focusing. Patient is aware of this impairment and appears significantly frustrated by it, though receptive to strategies to assist to these deficits. SLP guided patient through various cognitive tasks with patient requiring min-mod assist overall for memory, max assist for sustained attention, mod-max assist for problem solving due to attention deficits, and mod assist for word finding during conversation, also secondary to impaired attention. Conducted various confrontation and responsive naming tasks throughout session to determine purpose for word finding difficulty in conversation, though patient exhibited no deficits with word level naming, thus suspect conversational difficulty stems from attention deficits as previously mentioned. Patient in agreement. Patient not agreeable to consuming any solids this date, but did consume thin liquids with mod-max verbal cues required to recall throat clear/reswallow strategy consistently. Patient tolerated thin liquids using this strategy with some minimal wet vocal quality but otherwise no overt concerns. Patient was left in lowered bed with call bell in reach and bed alarm set. SLP will continue to target goals per plan of care.      Pain Pain Assessment Pain Scale: 0-10 Pain Score: 0-No pain  Therapy/Group: Individual Therapy  Jeannie Done, M.A.,  CCC-SLP  Yetta Barre 07/06/2023, 4:37 PM

## 2023-07-06 NOTE — Care Management (Signed)
Inpatient Rehabilitation Center Individual Statement of Services  Patient Name:  [name]  Date:  07/06/2023  Welcome to the Inpatient Rehabilitation Center.  Our goal is to provide you with an individualized program based on your diagnosis and situation, designed to meet your specific needs.  With this comprehensive rehabilitation program, you will be expected to participate in at least 3 hours of rehabilitation therapies Monday-Friday, with modified therapy programming on the weekends.  Your rehabilitation program will include the following services:  Physical Therapy (PT), Occupational Therapy (OT), Speech Therapy (ST), 24 hour per day rehabilitation nursing, Therapeutic Recreaction (TR), Psychology, Neuropsychology, Care Coordinator, Rehabilitation Medicine, Nutrition Services, Pharmacy Services, and Other  Weekly team conferences will be held on Tuesdays to discuss your progress.  Your Inpatient Rehabilitation Care Coordinator will talk with you frequently to get your input and to update you on team discussions.  Team conferences with you and your family in attendance may also be held.  Expected length of stay: 7-10 days  Overall anticipated outcome: Supervision  Depending on your progress and recovery, your program may change. Your Inpatient Rehabilitation Care Coordinator will coordinate services and will keep you informed of any changes. Your Inpatient Rehabilitation Care Coordinator's name and contact numbers are listed  below.  The following services may also be recommended but are not provided by the Inpatient Rehabilitation Center:  Driving Evaluations Home Health Rehabiltiation Services Outpatient Rehabilitation Services Vocational Rehabilitation   Arrangements will be made to provide these services after discharge if needed.  Arrangements include referral to agencies that provide these services.  Your insurance has been verified to be:  [employer]  Your primary doctor is:   Jason Fila Nooruddin  Pertinent information will be shared with your doctor and your insurance company.  Inpatient Rehabilitation Care Coordinator:  Susie Cassette [phone] or ([phone]  Information discussed with and copy given to patient by: Gretchen Short, 07/06/2023, 9:49 AM

## 2023-07-07 ENCOUNTER — Encounter (HOSPITAL_COMMUNITY): Payer: Self-pay | Admitting: Surgery

## 2023-07-07 DIAGNOSIS — I609 Nontraumatic subarachnoid hemorrhage, unspecified: Secondary | ICD-10-CM

## 2023-07-07 DIAGNOSIS — S069X9S Unspecified intracranial injury with loss of consciousness of unspecified duration, sequela: Secondary | ICD-10-CM

## 2023-07-07 LAB — GLUCOSE, CAPILLARY
Glucose-Capillary: 120 mg/dL — ABNORMAL HIGH (ref 70–99)
Glucose-Capillary: 109 mg/dL — ABNORMAL HIGH (ref 70–99)

## 2023-07-07 LAB — BASIC METABOLIC PANEL
Anion gap: 9 (ref 5–15)
BUN: 21 mg/dL (ref 8–23)
CO2: 28 mmol/L (ref 22–32)
Calcium: 9.3 mg/dL (ref 8.9–10.3)
Chloride: 94 mmol/L — ABNORMAL LOW (ref 98–111)
Creatinine, Ser: 0.93 mg/dL (ref 0.61–1.24)
GFR, Estimated: >60 mL/min (ref >60)
Glucose, Bld: 117 mg/dL — ABNORMAL HIGH (ref 70–99)
Potassium: 3.5 mmol/L (ref 3.5–5.1)
Sodium: 131 mmol/L — ABNORMAL LOW (ref 135–145)

## 2023-07-07 MED ORDER — METHYLPHENIDATE HCL 5 MG PO TABS
5.0000 mg | ORAL_TABLET | Freq: Two times a day (BID) | ORAL | Status: DC
  Administered 2023-07-08 – 2023-07-09 (×4): 5 mg via ORAL
  Filled 2023-07-07 (×4): qty 1

## 2023-07-07 MED ORDER — TRAZODONE HCL 50 MG PO TABS
50.0000 mg | ORAL_TABLET | Freq: Every day | ORAL | Status: DC
  Administered 2023-07-07 – 2023-07-10 (×4): 50 mg via ORAL
  Filled 2023-07-07 (×4): qty 1

## 2023-07-07 NOTE — Progress Notes (Signed)
Occupational Therapy TBI Note  Patient Details  Name: [name] MRN: [id number] Date of Birth: 04-Apr-1956  Today's Date: 07/07/2023 OT Individual Time: 1191-4782 OT Individual Time Calculation (min): 73 min    Short Term Goals: Week 1:  OT Short Term Goal 1 (Week 1): STG=LTG d/t ELOS  Skilled Therapeutic Interventions/Progress Updates:    Pt received supine with moderate headache, shower used as pain intervention, agreeable to OT session. His son [name] arrived and was present for the majority of the session. Education was provided to him on general TBI education, c-collar management at home, precautions, and reintegration to ADL routine at home. Pt came to EOB with (S), using bed rail. Functional mobility from EOB to the shower with min cueing required for sequencing transfer into shower and doffing clothing safely. He completed full body bathing with CGA when standing, no further cueing required. He returned to EOB and his son was given a demonstration on changing c collar pads. Checked orders and followed wound care instructions for trach stoma- including aquacel application with foam bandage. Pt had moderate drainage on previous gauze. Pt came to EOB and donned shirt with min A. He was able to follow cueing/demo for improved ability to reach shirt overhead and don his second shirt with (S). He donned pants with CGA. He completed 200 ft of functional mobility to the tub room to practice TTB use with no device, CGA. He required skilled cueing for upright posture, increasing pace and stride length.  He returned demonstration with mod cueing required for safety but CGA overall. Pt c/o headache increasing with light and he was provided sunglasses to reduce visual stimuli. He was left supine with NT present supervising breakfast and a fresh cup of coffee provided.    Therapy Documentation Precautions:  Precautions Precautions: Fall Precaution Comments: C-collar at all times Required Braces or  Orthoses: Cervical Brace Cervical Brace: At all times, Hard collar Restrictions Weight Bearing Restrictions Per Provider Order: No    Agitated Behavior Scale: TBI Observation Details Observation Environment: cir Start of observation period - Date: 07/07/23 Start of observation period - Time: 0730 End of observation period - Date: 07/07/23 End of observation period - Time: 0845 Agitated Behavior Scale (DO NOT LEAVE BLANKS) Short attention span, easy distractibility, inability to concentrate: Present to a slight degree Impulsive, impatient, low tolerance for pain or frustration: Absent Uncooperative, resistant to care, demanding: Absent Violent and/or threatening violence toward people or property: Absent Explosive and/or unpredictable anger: Absent Rocking, rubbing, moaning, or other self-stimulating behavior: Absent Pulling at tubes, restraints, etc.: Absent Wandering from treatment areas: Absent Restlessness, pacing, excessive movement: Absent Repetitive behaviors, motor, and/or verbal: Absent Rapid, loud, or excessive talking: Absent Sudden changes of mood: Absent Easily initiated or excessive crying and/or laughter: Absent Self-abusiveness, physical and/or verbal: Absent Agitated behavior scale total score: 15    Therapy/Group: Individual Therapy  Crissie Reese 07/07/2023, 9:08 AM

## 2023-07-07 NOTE — Progress Notes (Signed)
Patient ID: BURON MARINOFF, male   DOB: May 10, 1956, 67 y.o.   MRN: [id number]  SW received phone call from pt son who reported concerns about pt headaches, extension due to concerns related to his current inability to swallow foods, and FMLA forms in room. Encouraged to speak with nursing with regard to any headaches. SW will inform medical team on their concerns, and will  Medical team agree discharge date is now 12/28.   SW received FMLA forms, and informed pt mother and family member on pt extension and discharge date now 12/28.   SW left FMA forms for PA-Dan to complete.   1316- SW left message for pt son Eilam informing on above. Encouraged follow-up if needed, and SW will follow-up with updates after team conference.   1329- SW returned phone call to pt son to confirm family edu on Monday.   Cecile Sheerer, MSW, LCSW Office: [phone] Cell: [phone] Fax: [phone]

## 2023-07-07 NOTE — Plan of Care (Signed)
  Problem: Consults Goal: RH BRAIN INJURY PATIENT EDUCATION Description: Description: See Patient Education module for eduction specifics Outcome: Progressing   Problem: RH BOWEL ELIMINATION Goal: RH STG MANAGE BOWEL WITH ASSISTANCE Description: STG Manage Bowel with Assistance. Outcome: Progressing Goal: RH STG MANAGE BOWEL W/MEDICATION W/ASSISTANCE Description: STG Manage Bowel with Medication with Assistance. Outcome: Progressing   Problem: RH BLADDER ELIMINATION Goal: RH STG MANAGE BLADDER WITH ASSISTANCE Description: STG Manage Bladder With Assistance Outcome: Progressing   Problem: RH SKIN INTEGRITY Goal: RH STG SKIN FREE OF INFECTION/BREAKDOWN Outcome: Progressing   Problem: RH SAFETY Goal: RH STG ADHERE TO SAFETY PRECAUTIONS W/ASSISTANCE/DEVICE Description: STG Adhere to Safety Precautions With Assistance/Device. Outcome: Progressing   Problem: RH COGNITION-NURSING Goal: RH STG USES MEMORY AIDS/STRATEGIES W/ASSIST TO PROBLEM SOLVE Description: STG Uses Memory Aids/Strategies With Assistance to Problem Solve. Outcome: Progressing   Problem: RH PAIN MANAGEMENT Goal: RH STG PAIN MANAGED AT OR BELOW PT'S PAIN GOAL Outcome: Progressing   Problem: RH KNOWLEDGE DEFICIT BRAIN INJURY Goal: RH STG INCREASE KNOWLEDGE OF SELF CARE AFTER BRAIN INJURY Outcome: Progressing

## 2023-07-07 NOTE — Progress Notes (Signed)
PROGRESS NOTE   Subjective/Complaints:  No events overnight.  No acute complaints.  Family members at bedside with no questions or concerns today.  Per SLP, primary limiting factor for advancement of diet appears to be concentration.  Patient and family agreeable with initiation of Ritalin.  Patient does state is difficult to get comfortable in his bed at night, has had significant insomnia.  Asking for sleep medication adjustment.  ROS: + Right ankle pain-ongoing, + ear fullness-improved, + poor sleep -ongoing.denies fevers, chills, N/V, abdominal pain, constipation, diarrhea, SOB, cough, chest pain, new weakness or paraesthesias.    Objective:   DG Ankle 2 Views Right Result Date: 07/06/2023 CLINICAL DATA:  Right ankle pain, no stated injury EXAM: RIGHT ANKLE - 2 VIEW COMPARISON:  None Available. FINDINGS: There is no evidence of fracture, dislocation, or joint effusion. Minimal ankle mortise arthrosis. There is no evidence of arthropathy or other focal bone abnormality. Soft tissues are unremarkable. IMPRESSION: No fracture or dislocation of the right ankle. Electronically Signed   By: Jearld Lesch M.D.   On: 07/06/2023 15:52   Recent Labs    07/05/23 0522 07/06/23 0551  WBC 5.6  --   HGB 12.5* 12.5*  HCT 38.2* 38.4*  PLT 363  --    Recent Labs    07/06/23 0551 07/07/23 0510  NA 130* 131*  K 3.8 3.5  CL 94* 94*  CO2 28 28  GLUCOSE 116* 117*  BUN 17 21  CREATININE 0.83 0.93  CALCIUM 9.4 9.3    Intake/Output Summary (Last 24 hours) at 07/07/2023 2319 Last data filed at 07/07/2023 2110 Gross per 24 hour  Intake 477 ml  Output 250 ml  Net 227 ml        Physical Exam: Vital Signs Blood pressure 125/76, pulse 88, temperature 97.8 F (36.6 C), temperature source Oral, resp. rate 16, height 5\' 8"  (1.727 m), weight 71.9 kg, SpO2 99%. Constitutional: No apparent distress. Appropriate appearance for age.  Laying  on his side in bed. HENT: PERRLA, EOMI.  + cervical collar Cardiovascular: RRR, no murmurs/rub/gallops.  No edema.. Peripheral pulses 2+  Respiratory:   No respiratory distress, on room air.  No cough.  No further rhonchorous lung sounds. Abdomen: No distention or tenderness.  Skin: C/D/I. No apparent lesions. Healing scalp abrasions-not examined 12-20  MSK:      No apparent deformity.  Neurologic exam:  Cognition: AAO to person, place, time and event.  Can follow simple commands. + Concentration and memory deficits-ongoing + Higher level cognitive deficits-ongoing  Language: Fluent, mildly dysarthric.  Insight: Limited insight into current condition.  Mood: Pleasant affect, appropriate mood.  Sensation: Equal and intact in BL UE and Les.  Motor: Antigravity against resistance in all 4 extremities, approximately equal Reflexes: 2+ in BL UE and LEs. Negative Hoffman's and babinski signs bilaterally.  CN: 2-12 grossly intact.  Coordination: No apparent tremors.         Assessment/Plan: 1. Functional deficits which require 3+ hours per day of interdisciplinary therapy in a comprehensive inpatient rehab setting. Physiatrist is providing close team supervision and 24 hour management of active medical problems listed below. Physiatrist and rehab team continue to  assess barriers to discharge/monitor patient progress toward functional and medical goals  Care Tool:  Bathing    Body parts bathed by patient: Right arm, Left arm, Chest, Abdomen, Front perineal area, Buttocks, Right upper leg, Left upper leg, Right lower leg, Left lower leg, Face         Bathing assist Assist Level: Minimal Assistance - Patient > 75%     Upper Body Dressing/Undressing Upper body dressing   What is the patient wearing?: Pull over shirt    Upper body assist Assist Level: Moderate Assistance - Patient 50 - 74%    Lower Body Dressing/Undressing Lower body dressing      What is the patient  wearing?: Underwear/pull up, Pants     Lower body assist Assist for lower body dressing: Minimal Assistance - Patient > 75%     Toileting Toileting    Toileting assist Assist for toileting: Minimal Assistance - Patient > 75%     Transfers Chair/bed transfer  Transfers assist     Chair/bed transfer assist level: Minimal Assistance - Patient > 75%     Locomotion Ambulation   Ambulation assist      Assist level: Minimal Assistance - Patient > 75% Assistive device: No Device Max distance: 140'   Walk 10 feet activity   Assist     Assist level: Minimal Assistance - Patient > 75%     Walk 50 feet activity   Assist    Assist level: Minimal Assistance - Patient > 75%      Walk 150 feet activity   Assist Walk 150 feet activity did not occur: Safety/medical concerns (Due to fatigue)         Walk 10 feet on uneven surface  activity   Assist     Assist level: Minimal Assistance - Patient > 75%     Wheelchair     Assist Is the patient using a wheelchair?: Yes Type of Wheelchair: Manual    Wheelchair assist level: Dependent - Patient 0% Max wheelchair distance: 150'    Wheelchair 50 feet with 2 turns activity    Assist        Assist Level: Dependent - Patient 0%   Wheelchair 150 feet activity     Assist      Assist Level: Dependent - Patient 0%   Blood pressure 125/76, pulse 88, temperature 97.8 F (36.6 C), temperature source Oral, resp. rate 16, height 5\' 8"  (1.727 m), weight 71.9 kg, SpO2 99%.  1. Functional deficits secondary to TBI- [place] with SAH             -patient may  shower             -ELOS/Goals: 10-14 days- supervision-goal date 12-28  - stable to continue CIR 12/20: Family concerned regarding discharge home on dysphagia diet;  P.o. intakes 80 to 90%, so does appear to be getting enough nutrition.  2.  Antithrombotics: -DVT/anticoagulation:  Pharmaceutical: Lovenox             -antiplatelet  therapy: N/A 3. New onset headaches/Pain Management: Oxycodone and tylenol prn are effective when administered.              --Question addition of gabapentin for HA/Pain. Topamax added today for daily HA's. . 12/18: No headaches endorsed overnight, continue current regimen 12/19: Headaches increasingly intermittent, well-controlled on current management.  May move Topamax to as needed tomorrow if continues to improve.  Scheduled Tylenol 1000 mg 3 times daily, and reduce  oxycodone to 5 mg every 4 hours as needed. 12-20: Headaches much improved, DC Topamax   4. Mood/Behavior/Sleep: LCSW to follow for evaluation and support.              -antipsychotic agents:  N/A             --valium being weaned off--has been on 2 mg bid since 12/14. Continue Trazodone 50 mg  12/18: Reduce Valium to 1 mg twice daily tomorrow a.m. for 3 days, then DC  12-20: Scheduled trazodone 50 mg resumed due to poor sleep  5. Neuropsych/cognition: This patient is not fully capable of making decisions on his own behalf.  -Tele sitter for safety, continue today  12-19: No impulsive behaviors per staff or family; will monitor 1 additional day, then if remains stable will DC tele sitter  12/20: Add Ritalin 5 mg twice daily for concentration and arousal.  Maintain telemetry sitter 1 to 2 days per therapies, then reassess for DC  6. Skin/Wound Care: Monitor scalp abrasions which are healing well.  7. Fluids/Electrolytes/Nutrition: Monitor I/O. Intake reported to be reasonable  - 12/18: Mild hyponatremia 132; repeat in AM to trend . -12-19: 130 today, trend again in a.m. and if less will place on fluid restriction and remove trazodone. 12-20: NA 131, stable, resume trazodone.  Repeat Monday a.m.  8.  HTN: BP trending up. Amlodipine and Maxide resumed 12/17    - stable on current medication regimen    07/07/2023    7:22 PM 07/07/2023    1:41 PM 07/07/2023    9:31 AM  Vitals with BMI  Systolic 125 131 161  Diastolic 76 89  91  Pulse 88 100 106    9. 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    - 12/18: VPA level mildly low, no seizures this admission, management per neurology  10. Superficial vein thrombosis Left cephalic vein: Managed with local measures and pain/edema has resolved.    11. Reactive thrombocytosis: Continue to monitor. On Lovenox bid.     - 2/18 normalized/resolved  12. Anemia: Likely due to critical illness and stable around 9 for the past month --Recheck CBC in am--HgB 12.5; prior 9.5; recheck h/h in AM  13. Pre-diabetes: Hgb A1C- 6.2 last year and has been stable since 2014.  14. BPH: Urinary retention has resolved -->passed voiding trail on 12/4. Monitor I/O.    - continent  15. Remote H/o Polysubstance abuse: Has been sober/off drugs since 2015.  16. L3-L5 TVP Fx/ H/o Lumbar radiculopathy: Has been on gabapentin in the past (Dr. Maurice Small)   17. Resting tachycardia: Monitor for symptoms with increase in activity.    - HR WNL overnight; monitor  18. Liver cysts?: Needs follow up ultrasound in 2 months.  19. Dysphagia with D1 thin diet, however sounds gurgly and needs to clear throat almost constantly- might need to have swallow reassessed?   - 12/20: Per SLP primary barriers concentration, Ritalin initiated as above    20. Rhonchorous lung sounds.  Resolved  - CXR 12/2:  Lung volumes are slightly low. Linear opacity in the right mid lung and in the left lung base, favored to reflect subsegmental atelectasis. No acute consolidative airspace disease. No definite pleural effusions. - Has robinul IV TID and PRN robitussin - Incentive spirometry Q4H while awake - Sats stable on RA -12/19: DC Robinul.   LOS: 3 days A FACE TO FACE EVALUATION WAS  PERFORMED  Angelina Sheriff 07/07/2023, 11:19 PM

## 2023-07-07 NOTE — Progress Notes (Signed)
Speech Language Pathology Daily Session Note  Patient Details  Name: [name] MRN: [id number] Date of Birth: 23-Oct-1955  Today's Date: 07/07/2023 SLP Individual Time: 1007-1100 SLP Individual Time Calculation (min): 53 min  Short Term Goals: Week 1: SLP Short Term Goal 1 (Week 1): STGs=LTGs d/t ELOS  Skilled Therapeutic Interventions: SLP conducted skilled therapy session targeting swallowing, cognition, and communication goals with daughter and son present. Patient participated in trials of Dys1, Dys2, and thin liquids requiring extra processing/mastication time and several swallows to clear the oral cavity. Patient appearing to swish all textures in his mouth for a considerable amount of time prior to the swallow. Patient stating this is probably baseline, but suspect brain injury is impacting functional attention to bolus and thus overall swallowing function. Patient required mod-max assist to recall throat clear after each swallow. SLP guided patient through money counting task with patient requiring max assist to count requested amount due to attention deficits. Patient restless throughout session but indicating he was not uncomfortable. SLP discussed SLOP speech intelligibility increasing strategies (SLOP) and provided written handout. With max verbal cues, patient utilized these strategies to achieve 100% intelligibility at the word and short phrase levels. Patient was left in lowered bed with call bell in reach and bed alarm set. SLP will continue to target goals per plan of care.      Pain Pain Assessment Pain Scale: Faces Pain Score: 4  Faces Pain Scale: Hurts a little bit Pain Type: Acute pain Pain Location: Head Pain Frequency: Constant Pain Onset: Gradual Patients Stated Pain Goal: 0 Pain Intervention(s): Medication (See eMAR)  Therapy/Group: Individual Therapy  Jeannie Done, M.A., CCC-SLP  [name] 07/07/2023, 12:17 PM

## 2023-07-07 NOTE — IPOC Note (Signed)
Overall Plan of Care [facility]) Patient Details Name: [name] MRN: [id number] DOB: 1956/01/22  Admitting Diagnosis: TBI (traumatic brain injury) [facility])  Hospital Problems: Principal Problem:   TBI (traumatic brain injury) (HCC) Active Problems:   SAH (subarachnoid hemorrhage) (HCC)   Dysphagia, pharyngoesophageal phase     Functional Problem List: Nursing Behavior, Motor, Pain, Endurance, Safety  PT Balance, Endurance, Motor, Pain, Perception, Safety  OT Balance, Cognition, Safety, Endurance, Motor, Pain, Behavior  SLP Cognition, Nutrition, Motor  TR         Basic ADL's: OT Bathing, Dressing, Toileting     Advanced  ADL's: OT       Transfers: PT Bed Mobility, Bed to Chair, Car, Occupational psychologist, Research scientist (life sciences): PT Ambulation, Stairs     Additional Impairments: OT None  SLP Swallowing, Social Cognition   Problem Solving, Memory, Attention, Awareness  TR      Anticipated Outcomes Item Anticipated Outcome  Self Feeding no goal  Swallowing  min A   Basic self-care  (S)  Toileting  (S)   Bathroom Transfers (S)  Bowel/Bladder  remain continent of bowel and bladder  Transfers  Supervision  Locomotion  Supervision  Communication  min A  Cognition  min A  Pain  less than 5  Safety/Judgment  adhere with cues   Therapy Plan: PT Frequency: 5 out of 7 days PT Duration Estimated Length of Stay: 7-10 Days OT Intensity: Minimum of 1-2 x/day, 45 to 90 minutes OT Frequency: 5 out of 7 days OT Duration/Estimated Length of Stay: 7-10 days SLP Intensity: Minumum of 1-2 x/day, 30 to 90 minutes SLP Frequency: 3 to 5 out of 7 days SLP Duration/Estimated Length of Stay: 7-10 days   Team Interventions: Nursing Interventions Pain Management  PT interventions Ambulation/gait training, Community reintegration, DME/adaptive equipment instruction, Neuromuscular re-education, Psychosocial support, Stair training, UE/LE Strength taining/ROM,  Warden/ranger, Discharge planning, Pain management, Therapeutic Activities, UE/LE Coordination activities, Cognitive remediation/compensation, Functional mobility training, Patient/family education, Therapeutic Exercise  OT Interventions Balance/vestibular training, Discharge planning, Pain management, Self Care/advanced ADL retraining, Therapeutic Activities, UE/LE Coordination activities, Cognitive remediation/compensation, Functional mobility training, Patient/family education, Therapeutic Exercise, Skin care/wound managment, Community reintegration, Fish farm manager, UE/LE Strength taining/ROM, Wheelchair propulsion/positioning, Neuromuscular re-education  SLP Interventions Cognitive remediation/compensation  TR Interventions    SW/CM Interventions Discharge Planning, Psychosocial Support, Patient/Family Education   Barriers to Discharge MD  Medical stability, Home enviroment access/loayout, Incontinence, Lack of/limited family support, Insurance for SNF coverage, Behavior, and Nutritional means  Nursing Inaccessible home environment, Decreased caregiver support, Behavior family to provider 24/7 care by rotating schedules, one level home with 2 stairs to enter  PT      OT      SLP      SW Insurance for SNF coverage     Team Discharge Planning: Destination: PT-Home ,OT- Home , SLP-Home Projected Follow-up: PT-24 hour supervision/assistance, Outpatient PT, OT-  Outpatient OT, SLP-Home Health SLP, Outpatient SLP, 24 hour supervision/assistance Projected Equipment Needs: PT-To be determined, OT- To be determined, SLP-None recommended by SLP Equipment Details: PT- , OT-  Patient/family involved in discharge planning: PT- Patient,  OT-Patient, SLP-Family member/caregiver, Patient  MD ELOS: 10-14 days Medical Rehab Prognosis:  Good Assessment: The patient has been admitted for CIR therapies with the diagnosis of TBI. The team will be addressing functional  mobility, strength, stamina, balance, safety, adaptive techniques and equipment, self-care, bowel and bladder mgt, patient and caregiver education. Goals have been set at  supervision. Anticipated discharge destination is home.       See Team Conference Notes for weekly updates to the plan of care

## 2023-07-07 NOTE — Progress Notes (Signed)
Physical Therapy TBI Note  Patient Details  Name: [name] MRN: [id number] Date of Birth: 1956/07/17  Today's Date: 07/07/2023 PT Individual Time: 1416-1530 PT Individual Time Calculation (min): 74 min   Short Term Goals: Week 1:  PT Short Term Goal 1 (Week 1): STGs = LTGs  Skilled Therapeutic Interventions/Progress Updates:     Pt received supine in bed asleep. Slow to awaken and agrees to therapy. No complaint of pain. Pt performs supine to sit with cues for positioning. Pt performs ambulatory transfer to toilet with CGA and cues for sequencing. Pt is continent of bowel and bladder and ambulates back to [facility] with same cues. WC transport to gym. Pt completes Nustep for endurance training and reciprocal coordination. Pt completes x15:00 at workload of 5 with average steps per minute ~40. PT provides cues for hand and foot placement and completing full available ROM. Pt ambulates x500' with CGA progressing to minA due to postural instability and slight LOBs. PT provides cues for increased stride length, step height, and maintaining attention to task for safety. Following rest break, pt complete 6 minute walk test with CGA, completing distance of 918' and without any notable LOBs. Pt verbalizes fatigue following test and requires rest break. Pt then ambulates x150' back to [facility] with CGA and same cues. WC transport back to room. Pt performs toilet transfer with cues for safety. Pt left supine in bed with alarm intact and all needs within reach.   Therapy Documentation Precautions:  Precautions Precautions: Fall Precaution Comments: C-collar at all times Required Braces or Orthoses: Cervical Brace Cervical Brace: At all times, Hard collar Restrictions Weight Bearing Restrictions Per Provider Order: No    Therapy/Group: Individual Therapy  Beau Fanny, PT, DPT 07/07/2023, 4:14 PM

## 2023-07-07 NOTE — Plan of Care (Signed)
  Problem: Consults Goal: RH BRAIN INJURY PATIENT EDUCATION Description: Description: See Patient Education module for eduction specifics Outcome: Progressing   Problem: RH BLADDER ELIMINATION Goal: RH STG MANAGE BLADDER WITH ASSISTANCE Description: STG Manage Bladder With Assistance Outcome: Progressing   Problem: RH SAFETY Goal: RH STG ADHERE TO SAFETY PRECAUTIONS W/ASSISTANCE/DEVICE Description: STG Adhere to Safety Precautions With Assistance/Device. Outcome: Progressing   Problem: RH PAIN MANAGEMENT Goal: RH STG PAIN MANAGED AT OR BELOW PT'S PAIN GOAL Outcome: Progressing

## 2023-07-08 DIAGNOSIS — S069XAD Unspecified intracranial injury with loss of consciousness status unknown, subsequent encounter: Secondary | ICD-10-CM

## 2023-07-08 DIAGNOSIS — S069XAS Unspecified intracranial injury with loss of consciousness status unknown, sequela: Secondary | ICD-10-CM

## 2023-07-08 DIAGNOSIS — R1314 Dysphagia, pharyngoesophageal phase: Secondary | ICD-10-CM

## 2023-07-08 LAB — GLUCOSE, CAPILLARY: Glucose-Capillary: 111 mg/dL — ABNORMAL HIGH (ref 70–99)

## 2023-07-08 NOTE — Plan of Care (Signed)
  Problem: Consults Goal: RH BRAIN INJURY PATIENT EDUCATION Description: Description: See Patient Education module for eduction specifics Outcome: Progressing   Problem: RH BOWEL ELIMINATION Goal: RH STG MANAGE BOWEL WITH ASSISTANCE Description: STG Manage Bowel with Assistance. Outcome: Progressing Goal: RH STG MANAGE BOWEL W/MEDICATION W/ASSISTANCE Description: STG Manage Bowel with Medication with Assistance. Outcome: Progressing   Problem: RH BLADDER ELIMINATION Goal: RH STG MANAGE BLADDER WITH ASSISTANCE Description: STG Manage Bladder With Assistance Outcome: Progressing   Problem: RH SKIN INTEGRITY Goal: RH STG SKIN FREE OF INFECTION/BREAKDOWN Outcome: Progressing   Problem: RH SAFETY Goal: RH STG ADHERE TO SAFETY PRECAUTIONS W/ASSISTANCE/DEVICE Description: STG Adhere to Safety Precautions With Assistance/Device. Outcome: Progressing   Problem: RH COGNITION-NURSING Goal: RH STG USES MEMORY AIDS/STRATEGIES W/ASSIST TO PROBLEM SOLVE Description: STG Uses Memory Aids/Strategies With Assistance to Problem Solve. Outcome: Progressing   Problem: RH PAIN MANAGEMENT Goal: RH STG PAIN MANAGED AT OR BELOW PT'S PAIN GOAL Outcome: Progressing   Problem: RH KNOWLEDGE DEFICIT BRAIN INJURY Goal: RH STG INCREASE KNOWLEDGE OF SELF CARE AFTER BRAIN INJURY Outcome: Progressing

## 2023-07-08 NOTE — Plan of Care (Signed)
ABS discontinued d/t ABS score less than 20 for the last three days or no behaviors present  

## 2023-07-08 NOTE — Progress Notes (Signed)
Speech Language Pathology Daily Session Note  Patient Details  Name: [name] MRN: [id number] Date of Birth: 1955-08-26  Today's Date: 07/08/2023 SLP Individual Time: 4332-9518 SLP Individual Time Calculation (min): 58 min  Short Term Goals: Week 1: SLP Short Term Goal 1 (Week 1): STGs=LTGs d/t ELOS  Skilled Therapeutic Interventions: Skilled therapy session focused on speech and cognitive goals. Upon entrance, patient returning from bathroom with nurse tech. Patient unable to recall speech intelligibility strategies, therefore SLP re-educated. SLP encouraged patient to utilize strategies by prompting patient to repeat repetitive phonemic phrases. Patient 80% intelligibile at the phrase level given minA. SLP targeted cognitive goals through completion of riddles and scavenger hunt. Patient required minA to complete word riddles and identify winter images around the room. Of note, patient with poor functional awareness requiring modA to demonstrate safety awareness with walker. Patient left in bed with alarm set and call bell in reach. Continue POC.   Pain None reported   Therapy/Group: Individual Therapy  Michelangelo Rindfleisch M.A., CF-SLP 07/08/2023, 7:49 AM

## 2023-07-08 NOTE — Progress Notes (Signed)
Physical Therapy TBI Note  Patient Details  Name: [name] MRN: [id number] Date of Birth: 27-Dec-1955  Today's Date: 07/08/2023 PT Individual Time: 4098-1191 PT Individual Time Calculation (min): 27 min  Today's Date: 07/08/2023 PT Missed Time: 33 Minutes Missed Time Reason: Patient unwilling to participate  Short Term Goals: Week 1:  PT Short Term Goal 1 (Week 1): STGs = LTGs  Skilled Therapeutic Interventions/Progress Updates:   Received pt sitting in WC, pt agreeable to PT treatment, and reported pain 4/10 in head (premedicated). Session with emphasis on functional mobility/transfers, generalized strengthening and endurance, dynamic standing balance/coordination, gait training, and NMR. Pt performed all transfers without AD and CGA throughout session. Pt ambulated >319ft without AD and CGA fading to close supervision to main therapy gym. Worked on the following activities on Bi[employer] with emphasis on dynamic standing balance, proprioception and spatial awareness: - weight shift training on level static with BUE support fading to 1UE support for 2 minutes - pt reported feeling dizzy, therefore stopped activity.   Stepped on/off Airex with min A and worked on dynamic standing balance, reaction time, and coordination tossing ball against rebounder x20 with min A for balance. Pt declined attempting task again stating "it's just too much right now". Offered various lower level activities, but pt insisting on lying down and getting irritated when therapist attempted to distract with other options. Offered getting sunglasses but pt declined stating "my head hurts and I need to lie down". Pt ambulated 136ft without AD and CGA/close supervision back to room. Removed shoes without assist and transitioned into sidelying with supervision. Pt pulled covers over head and concluded session with pt sidelying in bed, needs within reach, and bed alarm on. Telesitter in place. 33 minutes missed of skilled  physical therapy due to refusal to participate.   Therapy Documentation Precautions:  Precautions Precautions: Fall Precaution Comments: C-collar at all times Required Braces or Orthoses: Cervical Brace Cervical Brace: At all times, Hard collar Restrictions Weight Bearing Restrictions Per Provider Order: No Agitated Behavior Scale: TBI Observation Details Observation Environment: CIR Start of observation period - Date: 07/08/23 Start of observation period - Time: 0945 End of observation period - Date: 07/08/23 End of observation period - Time: 1012 Agitated Behavior Scale (DO NOT LEAVE BLANKS) Short attention span, easy distractibility, inability to concentrate: Absent Impulsive, impatient, low tolerance for pain or frustration: Present to a slight degree Uncooperative, resistant to care, demanding: Present to a slight degree Violent and/or threatening violence toward people or property: Absent Explosive and/or unpredictable anger: Absent Rocking, rubbing, moaning, or other self-stimulating behavior: Absent Pulling at tubes, restraints, etc.: Absent Wandering from treatment areas: Absent Restlessness, pacing, excessive movement: Absent Repetitive behaviors, motor, and/or verbal: Absent Rapid, loud, or excessive talking: Absent Sudden changes of mood: Present to a slight degree Easily initiated or excessive crying and/or laughter: Absent Self-abusiveness, physical and/or verbal: Absent Agitated behavior scale total score: 17   Therapy/Group: Individual Therapy Marlana Salvage Zaunegger Blima Rich PT, DPT 07/08/2023, 6:55 AM

## 2023-07-08 NOTE — Progress Notes (Signed)
Occupational Therapy TBI Note  Patient Details  Name: [name] MRN: [id number] Date of Birth: Apr 29, 1956  Today's Date: 07/08/2023 OT Individual Time: 4098-1191 OT Individual Time Calculation (min): 80 min    Short Term Goals: Week 1:  OT Short Term Goal 1 (Week 1): STG=LTG d/t ELOS  Skilled Therapeutic Interventions/Progress Updates:    Pt received sitting up in bed initiating breakfast. Provided full supervision and intermittent cues for throat clear. He required increased time to eat 2/2 needing to throat clear. He completed bed mobility to EOB with (S). His son arrived. Pt completed UB Adls EOB with close (S), improved shoulder flexion to 100 degrees. He completed LB ADLs with CGA for standing balance, good carryover of need to sit to thread. Oral care in standing with (S). He completed 200 ft of functional mobility with CGA, periods of (S) to the tub room. He completed a tub transfer using the TTB with CGA. His son was present for demonstration of the TTB use and he ordered one on [place]. Also recommended a removable shower head. He then used the BITS to complete several visual tracking and peripheral vision exercises, as well as addressing FMC and BUE coordination. He ended with navigating a full flight of stairs to address both functional endurance and dynamic balance for ADLs. He required min A- CGA overall with a single hand rail. Pt returned to his room and was left sitting up with his son and brother in room. All needs met.   Therapy Documentation Precautions:  Precautions Precautions: Fall Precaution Comments: C-collar at all times Required Braces or Orthoses: Cervical Brace Cervical Brace: At all times, Hard collar Restrictions Weight Bearing Restrictions Per Provider Order: No    Agitated Behavior Scale: TBI Observation Details Observation Environment: cir Start of observation period - Date: 07/08/23 Start of observation period - Time: 0730 End of observation  period - Date: 07/08/23 End of observation period - Time: 0845 Agitated Behavior Scale (DO NOT LEAVE BLANKS) Short attention span, easy distractibility, inability to concentrate: Absent Impulsive, impatient, low tolerance for pain or frustration: Absent Uncooperative, resistant to care, demanding: Absent Violent and/or threatening violence toward people or property: Absent Explosive and/or unpredictable anger: Absent Rocking, rubbing, moaning, or other self-stimulating behavior: Absent Pulling at tubes, restraints, etc.: Absent Wandering from treatment areas: Absent Restlessness, pacing, excessive movement: Absent Repetitive behaviors, motor, and/or verbal: Absent Rapid, loud, or excessive talking: Absent Sudden changes of mood: Absent Easily initiated or excessive crying and/or laughter: Absent Self-abusiveness, physical and/or verbal: Absent Agitated behavior scale total score: 14    Therapy/Group: Individual Therapy  Crissie Reese 07/08/2023, 8:50 AM

## 2023-07-08 NOTE — Progress Notes (Deleted)
Inpatient Rehabilitation Discharge Medication Review by a Pharmacist  A complete drug regimen review was completed for this patient to identify any potential clinically significant medication issues.  High Risk Drug Classes Is patient taking? Indication by Medication  Antipsychotic Yes Compazine-N/V  Anticoagulant Yes Enoxaparin-VTE px  Antibiotic No   Opioid Yes Oxycodone-pain  Antiplatelet No   Hypoglycemics/insulin No   Vasoactive Medication Yes Amlodpine, Maxzide-HTN Hydralazine prn-HTN  Chemotherapy No   Other Yes Diazepam - mood, anxiety Diclofenac - pain Methylphenidate - attention Trazodone - sleep Valproic acid - seizures     Type of Medication Issue Identified Description of Issue Recommendation(s)  Drug Interaction(s) (clinically significant)     Duplicate Therapy     Allergy     No Medication Administration End Date     Incorrect Dose     Additional Drug Therapy Needed     Significant med changes from prior encounter (inform family/care partners about these prior to discharge).    Other       Clinically significant medication issues were identified that warrant physician communication and completion of prescribed/recommended actions by midnight of the next day:  No  Name of provider notified for urgent issues identified:   Provider Method of Notification:     Pharmacist comments: Transferring out of rehab   Time spent performing this drug regimen review (minutes):  20  Okey Regal, PharmD

## 2023-07-08 NOTE — Progress Notes (Addendum)
PROGRESS NOTE   Subjective/Complaints:  Pt states that he takes pills with peanut butter at home on D1 thin liquids   ROS: denies fevers, chills, N/V, abdominal pain, constipation, diarrhea, SOB, cough, chest pain, new weakness or paraesthesias.    Objective:   DG Ankle 2 Views Right Result Date: 07/06/2023 CLINICAL DATA:  Right ankle pain, no stated injury EXAM: RIGHT ANKLE - 2 VIEW COMPARISON:  None Available. FINDINGS: There is no evidence of fracture, dislocation, or joint effusion. Minimal ankle mortise arthrosis. There is no evidence of arthropathy or other focal bone abnormality. Soft tissues are unremarkable. IMPRESSION: No fracture or dislocation of the right ankle. Electronically Signed   By: Jearld Lesch M.D.   On: 07/06/2023 15:52   Recent Labs    07/06/23 0551  HGB 12.5*  HCT 38.4*   Recent Labs    07/06/23 0551 07/07/23 0510  NA 130* 131*  K 3.8 3.5  CL 94* 94*  CO2 28 28  GLUCOSE 116* 117*  BUN 17 21  CREATININE 0.83 0.93  CALCIUM 9.4 9.3    Intake/Output Summary (Last 24 hours) at 07/08/2023 0852 Last data filed at 07/08/2023 0744 Gross per 24 hour  Intake 1071 ml  Output 250 ml  Net 821 ml        Physical Exam: Vital Signs Blood pressure 126/77, pulse 94, temperature 98.2 F (36.8 C), resp. rate 18, height 5\' 8"  (1.727 m), weight 71.9 kg, SpO2 98%.  ENT- cervical orthosis  General: No acute distress Mood and affect are appropriate Heart: Regular rate and rhythm no rubs murmurs or extra sounds Lungs: Clear to auscultation, breathing unlabored, no rales or wheezes Abdomen: Positive bowel sounds, soft nontender to palpation, nondistended Extremities: No clubbing, cyanosis, or edema Skin: No evidence of breakdown, no evidence of rash Neurologic: motor strength is 5/5 in bilateral deltoid, bicep, tricep, grip, hip flexor, knee extensors, ankle dorsiflexor and plantar flexor Finger to thumb  opposition intact although mildly slowed  Cerebellar exam normal finger to nose to finger Musculoskeletal: Full range of motion in all 4 extremities. No joint swelling   Assessment/Plan: 1. Functional deficits which require 3+ hours per day of interdisciplinary therapy in a comprehensive inpatient rehab setting. Physiatrist is providing close team supervision and 24 hour management of active medical problems listed below. Physiatrist and rehab team continue to assess barriers to discharge/monitor patient progress toward functional and medical goals  Care Tool:  Bathing    Body parts bathed by patient: Right arm, Left arm, Chest, Abdomen, Front perineal area, Buttocks, Right upper leg, Left upper leg, Right lower leg, Left lower leg, Face         Bathing assist Assist Level: Minimal Assistance - Patient > 75%     Upper Body Dressing/Undressing Upper body dressing   What is the patient wearing?: Pull over shirt    Upper body assist Assist Level: Moderate Assistance - Patient 50 - 74%    Lower Body Dressing/Undressing Lower body dressing      What is the patient wearing?: Underwear/pull up, Pants     Lower body assist Assist for lower body dressing: Minimal Assistance - Patient > 75%  Toileting Toileting    Toileting assist Assist for toileting: Minimal Assistance - Patient > 75%     Transfers Chair/bed transfer  Transfers assist     Chair/bed transfer assist level: Minimal Assistance - Patient > 75%     Locomotion Ambulation   Ambulation assist      Assist level: Minimal Assistance - Patient > 75% Assistive device: No Device Max distance: 140'   Walk 10 feet activity   Assist     Assist level: Minimal Assistance - Patient > 75%     Walk 50 feet activity   Assist    Assist level: Minimal Assistance - Patient > 75%      Walk 150 feet activity   Assist Walk 150 feet activity did not occur: Safety/medical concerns (Due to fatigue)          Walk 10 feet on uneven surface  activity   Assist     Assist level: Minimal Assistance - Patient > 75%     Wheelchair     Assist Is the patient using a wheelchair?: Yes Type of Wheelchair: Manual    Wheelchair assist level: Dependent - Patient 0% Max wheelchair distance: 150'    Wheelchair 50 feet with 2 turns activity    Assist        Assist Level: Dependent - Patient 0%   Wheelchair 150 feet activity     Assist      Assist Level: Dependent - Patient 0%   Blood pressure 126/77, pulse 94, temperature 98.2 F (36.8 C), resp. rate 18, height 5\' 8"  (1.727 m), weight 71.9 kg, SpO2 98%.  1. Functional deficits secondary to TBI- [place] with SAH             -patient may  shower             -ELOS/Goals: 10-14 days- supervision-goal date 12-28  - stable to continue CIR 12/20: Family concerned regarding discharge home on dysphagia diet;  P.o. intakes 80 to 90%, so does appear to be getting enough nutrition.  2.  Antithrombotics: -DVT/anticoagulation:  Pharmaceutical: Lovenox             -antiplatelet therapy: N/A 3. New onset headaches/Pain Management: Oxycodone and tylenol prn are effective when administered.              --Question addition of gabapentin for HA/Pain. Topamax added today for daily HA's. . 12/18: No headaches endorsed overnight, continue current regimen 12/19: Headaches increasingly intermittent, well-controlled on current management.  May move Topamax to as needed tomorrow if continues to improve.  Scheduled Tylenol 1000 mg 3 times daily, and reduce oxycodone to 5 mg every 4 hours as needed. 12-20: Headaches much improved, DC Topamax   4. Mood/Behavior/Sleep: LCSW to follow for evaluation and support.              -antipsychotic agents:  N/A             --valium being weaned off--has been on 2 mg bid since 12/14. Continue Trazodone 50 mg  12/18: Reduce Valium to 1 mg twice daily tomorrow a.m. for 3 days, then DC  12-20:  Scheduled trazodone 50 mg resumed due to poor sleep  5. Neuropsych/cognition: This patient is not fully capable of making decisions on his own behalf.  -Tele sitter for safety, continue today  12-19: No impulsive behaviors per staff or family; will monitor 1 additional day, then if remains stable will DC tele sitter  12/20: Add Ritalin 5  mg twice daily for concentration and arousal.  Maintain telemetry sitter 1 to 2 days per therapies, then reassess for DC  6. Skin/Wound Care: Monitor scalp abrasions which are healing well.  7. Fluids/Electrolytes/Nutrition: Monitor I/O. Intake reported to be reasonable  - 12/18: Mild hyponatremia 132; repeat in AM to trend . -12-19: 130 today, trend again in a.m. and if less will place on fluid restriction and remove trazodone. 12-20: NA 131, stable, resume trazodone.  Repeat Monday a.m.  8.  HTN: BP trending up. Amlodipine and Maxide resumed 12/17    - stable on current medication regimen    07/08/2023    4:31 AM 07/07/2023    7:22 PM 07/07/2023    1:41 PM  Vitals with BMI  Systolic 126 125 161  Diastolic 77 76 89  Pulse 94 88 100  Controlled 12/21  9. 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    - 12/18: VPA level mildly low, no seizures this admission, management per neurology  10. Superficial vein thrombosis Left cephalic vein: Managed with local measures and pain/edema has resolved.    11. Reactive thrombocytosis: Continue to monitor. On Lovenox bid.     - 2/18 normalized/resolved  12. Anemia: Likely due to critical illness and stable around 9 for the past month --Recheck CBC in am--HgB 12.5; prior 9.5; recheck h/h in AM  13. Pre-diabetes: Hgb A1C- 6.2 last year and has been stable since 2014.  14. BPH: Urinary retention has resolved -->passed voiding trail on 12/4. Monitor I/O.    - continent  15. Remote H/o Polysubstance abuse: Has been sober/off drugs since 2015.   16. L3-L5 TVP Fx/ H/o Lumbar radiculopathy: Has been on gabapentin in the past (Dr. Maurice Small)   17. Resting tachycardia: Monitor for symptoms with increase in activity.    - HR WNL overnight; monitor  18. Liver cysts?: Needs follow up ultrasound in 2 months.  19. Dysphagia with D1 thin diet, however sounds gurgly and needs to clear throat almost constantly- might need to have swallow reassessed?   - 12/20: Per SLP primary barriers concentration, Ritalin initiated as above    20. Rhonchorous lung sounds.  Resolved 21.  C2 base fracture cont cervical orthosis    LOS: 4 days A FACE TO FACE EVALUATION WAS PERFORMED  Erick Colace 07/08/2023, 8:52 AM

## 2023-07-09 LAB — GLUCOSE, CAPILLARY
Glucose-Capillary: 113 mg/dL — ABNORMAL HIGH (ref 70–99)
Glucose-Capillary: 112 mg/dL — ABNORMAL HIGH (ref 70–99)
Glucose-Capillary: 111 mg/dL — ABNORMAL HIGH (ref 70–99)
Glucose-Capillary: 124 mg/dL — ABNORMAL HIGH (ref 70–99)

## 2023-07-09 MED ORDER — METHYLPHENIDATE HCL 5 MG PO TABS
5.0000 mg | ORAL_TABLET | Freq: Every day | ORAL | Status: DC
Start: 2023-07-10 — End: 2023-07-11
  Administered 2023-07-10 – 2023-07-11 (×2): 5 mg via ORAL
  Filled 2023-07-09 (×2): qty 1

## 2023-07-09 NOTE — Progress Notes (Signed)
PROGRESS NOTE   Subjective/Complaints: Mildly agitated removed clothing because he was hot  Pt requesting diet change Also has itching along the ant neck, cervical orthosis Explained need to keep orthosis on AAT   ROS: denies fevers, chills, N/V, abdominal pain, constipation, diarrhea, SOB, cough, chest pain, new weakness or paraesthesias.    Objective:   No results found.  No results for input(s): "WBC", "HGB", "HCT", "PLT" in the last 72 hours.  Recent Labs    07/07/23 0510  NA 131*  K 3.5  CL 94*  CO2 28  GLUCOSE 117*  BUN 21  CREATININE 0.93  CALCIUM 9.3    Intake/Output Summary (Last 24 hours) at 07/09/2023 1216 Last data filed at 07/09/2023 0739 Gross per 24 hour  Intake 1536 ml  Output 175 ml  Net 1361 ml        Physical Exam: Vital Signs Blood pressure 129/88, pulse 90, temperature 97.6 F (36.4 C), resp. rate 18, height 5\' 8"  (1.727 m), weight 67.1 kg, SpO2 97%.  ENT- cervical orthosis  General: No acute distress Mood and affect are appropriate Heart: Regular rate and rhythm no rubs murmurs or extra sounds Lungs: Clear to auscultation, breathing unlabored, no rales or wheezes Abdomen: Positive bowel sounds, soft nontender to palpation, nondistended Extremities: No clubbing, cyanosis, or edema Skin: No evidence of breakdown, no evidence of rash, no irritation or folliculitis around the beard area  Neurologic: motor strength is 5/5 in bilateral deltoid, bicep, tricep, grip, hip flexor, knee extensors, ankle dorsiflexor and plantar flexor Mild dysdiadochokinesis with RAM of both UEs, finger to thumb intact Speech without dysarthria or aphasia  Finger to thumb opposition intact although mildly slowed  Cerebellar exam normal finger to nose to finger Musculoskeletal: Full range of motion in all 4 extremities. No joint swelling MSK- Shoulder ROM wfl   Assessment/Plan: 1. Functional deficits which  require 3+ hours per day of interdisciplinary therapy in a comprehensive inpatient rehab setting. Physiatrist is providing close team supervision and 24 hour management of active medical problems listed below. Physiatrist and rehab team continue to assess barriers to discharge/monitor patient progress toward functional and medical goals  Care Tool:  Bathing    Body parts bathed by patient: Right arm, Left arm, Chest, Abdomen, Front perineal area, Buttocks, Right upper leg, Left upper leg, Right lower leg, Left lower leg, Face         Bathing assist Assist Level: Minimal Assistance - Patient > 75%     Upper Body Dressing/Undressing Upper body dressing   What is the patient wearing?: Pull over shirt    Upper body assist Assist Level: Moderate Assistance - Patient 50 - 74%    Lower Body Dressing/Undressing Lower body dressing      What is the patient wearing?: Underwear/pull up, Pants     Lower body assist Assist for lower body dressing: Minimal Assistance - Patient > 75%     Toileting Toileting    Toileting assist Assist for toileting: Minimal Assistance - Patient > 75%     Transfers Chair/bed transfer  Transfers assist     Chair/bed transfer assist level: Minimal Assistance - Patient > 75%  Locomotion Ambulation   Ambulation assist      Assist level: Minimal Assistance - Patient > 75% Assistive device: No Device Max distance: 140'   Walk 10 feet activity   Assist     Assist level: Minimal Assistance - Patient > 75%     Walk 50 feet activity   Assist    Assist level: Minimal Assistance - Patient > 75%      Walk 150 feet activity   Assist Walk 150 feet activity did not occur: Safety/medical concerns (Due to fatigue)         Walk 10 feet on uneven surface  activity   Assist     Assist level: Minimal Assistance - Patient > 75%     Wheelchair     Assist Is the patient using a wheelchair?: Yes Type of Wheelchair:  Manual    Wheelchair assist level: Dependent - Patient 0% Max wheelchair distance: 150'    Wheelchair 50 feet with 2 turns activity    Assist        Assist Level: Dependent - Patient 0%   Wheelchair 150 feet activity     Assist      Assist Level: Dependent - Patient 0%   Blood pressure 129/88, pulse 90, temperature 97.6 F (36.4 C), resp. rate 18, height 5\' 8"  (1.727 m), weight 67.1 kg, SpO2 97%.  1. Functional deficits secondary to TBI- [place] with SAH             -patient may  shower             -ELOS/Goals: 10-14 days- supervision-goal date 12-28  - stable to continue CIR 12/20: Family concerned regarding discharge home on dysphagia diet;  P.o. intakes 80 to 90%, so does appear to be getting enough nutrition. Speech to re eval in am  2.  Antithrombotics: -DVT/anticoagulation:  Pharmaceutical: Lovenox             -antiplatelet therapy: N/A 3. New onset headaches/Pain Management: Oxycodone and tylenol prn are effective when administered.              --Question addition of gabapentin for HA/Pain. Topamax added today for daily HA's. . 12/18: No headaches endorsed overnight, continue current regimen 12/19: Headaches increasingly intermittent, well-controlled on current management.  May move Topamax to as needed tomorrow if continues to improve.  Scheduled Tylenol 1000 mg 3 times daily, and reduce oxycodone to 5 mg every 4 hours as needed. 12-20: Headaches much improved, DC Topamax   4. Mood/Behavior/Sleep: LCSW to follow for evaluation and support.              -antipsychotic agents:  N/A             --valium being weaned off--has been on 2 mg bid since 12/14. Continue Trazodone 50 mg  12/18: Reduce Valium to 1 mg twice daily tomorrow a.m. for 3 days, then DC  12-20: Scheduled trazodone 50 mg resumed due to poor sleep  5. Neuropsych/cognition: This patient is not fully capable of making decisions on his own behalf.  -Tele sitter for safety, continue  today  12-19: No impulsive behaviors per staff or family; will monitor 1 additional day, then if remains stable will DC tele sitter  12/20: Add Ritalin 5 mg twice daily for concentration and arousal.  Maintain telemetry sitter 1 to 2 days per therapies, then reassess for DC Arousal no longer an issue will reduce ritalin to qam 12/22 6. Skin/Wound Care: Monitor scalp abrasions  which are healing well.  7. Fluids/Electrolytes/Nutrition: Monitor I/O. Intake reported to be reasonable  - 12/18: Mild hyponatremia 132; repeat in AM to trend . -12-19: 130 today, trend again in a.m. and if less will place on fluid restriction and remove trazodone. 12-20: NA 131, stable, resume trazodone.  Repeat Monday a.m.  8.  HTN: BP trending up. Amlodipine and Maxide resumed 12/17    - stable on current medication regimen    07/09/2023    9:11 AM 07/09/2023    6:00 AM 07/09/2023    3:49 AM  Vitals with BMI  Weight  148 lbs   BMI  22.51   Systolic 129  123  Diastolic 88  89  Pulse   90  Controlled 12/22  9. 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    - 12/18: VPA level mildly low, no seizures this admission, management per neurology  10. Superficial vein thrombosis Left cephalic vein: Managed with local measures and pain/edema has resolved.    11. Reactive thrombocytosis: Continue to monitor. On Lovenox bid.     - 2/18 normalized/resolved  12. Anemia: Likely due to critical illness and stable around 9 for the past month --Recheck CBC in am--HgB 12.5; prior 9.5; recheck h/h in AM  13. Pre-diabetes: Hgb A1C- 6.2 last year and has been stable since 2014.  14. BPH: Urinary retention has resolved -->passed voiding trail on 12/4. Monitor I/O.    - continent  15. Remote H/o Polysubstance abuse: Has been sober/off drugs since 2015.  16. L3-L5 TVP Fx/ H/o Lumbar radiculopathy: Has been on gabapentin in the past (Dr. Maurice Small)   17.  Resting tachycardia: Monitor for symptoms with increase in activity.    - HR WNL overnight; monitor  18. Liver cysts?: Needs follow up ultrasound in 2 months.  19. Dysphagia with D1 thin diet, however sounds gurgly and needs to clear throat almost constantly- might need to have swallow reassessed?   - 12/20: Per SLP primary barriers concentration, Ritalin initiated as above    20. Rhonchorous lung sounds.  Resolved 21.  C2 base fracture cont cervical orthosis    LOS: 5 days A FACE TO FACE EVALUATION WAS PERFORMED  Erick Colace 07/09/2023, 12:16 PM

## 2023-07-09 NOTE — Plan of Care (Signed)
  Problem: Consults Goal: RH BRAIN INJURY PATIENT EDUCATION Description: Description: See Patient Education module for eduction specifics Outcome: Progressing   Problem: RH BOWEL ELIMINATION Goal: RH STG MANAGE BOWEL WITH ASSISTANCE Description: STG Manage Bowel with Assistance. Outcome: Progressing Goal: RH STG MANAGE BOWEL W/MEDICATION W/ASSISTANCE Description: STG Manage Bowel with Medication with Assistance. Outcome: Progressing   Problem: RH BLADDER ELIMINATION Goal: RH STG MANAGE BLADDER WITH ASSISTANCE Description: STG Manage Bladder With Assistance Outcome: Progressing   Problem: RH SKIN INTEGRITY Goal: RH STG SKIN FREE OF INFECTION/BREAKDOWN Outcome: Progressing   Problem: RH SAFETY Goal: RH STG ADHERE TO SAFETY PRECAUTIONS W/ASSISTANCE/DEVICE Description: STG Adhere to Safety Precautions With Assistance/Device. Outcome: Progressing   Problem: RH COGNITION-NURSING Goal: RH STG USES MEMORY AIDS/STRATEGIES W/ASSIST TO PROBLEM SOLVE Description: STG Uses Memory Aids/Strategies With Assistance to Problem Solve. Outcome: Progressing   Problem: RH PAIN MANAGEMENT Goal: RH STG PAIN MANAGED AT OR BELOW PT'S PAIN GOAL Outcome: Progressing   Problem: RH KNOWLEDGE DEFICIT BRAIN INJURY Goal: RH STG INCREASE KNOWLEDGE OF SELF CARE AFTER BRAIN INJURY Outcome: Progressing

## 2023-07-09 NOTE — Plan of Care (Signed)
  Problem: RH BOWEL ELIMINATION Goal: RH STG MANAGE BOWEL WITH ASSISTANCE Description: STG Manage Bowel with Assistance. Outcome: Progressing Goal: RH STG MANAGE BOWEL W/MEDICATION W/ASSISTANCE Description: STG Manage Bowel with Medication with Assistance. Outcome: Progressing   Problem: RH BLADDER ELIMINATION Goal: RH STG MANAGE BLADDER WITH ASSISTANCE Description: STG Manage Bladder With Assistance Outcome: Progressing   Problem: RH SAFETY Goal: RH STG ADHERE TO SAFETY PRECAUTIONS W/ASSISTANCE/DEVICE Description: STG Adhere to Safety Precautions With Assistance/Device. Outcome: Progressing   Problem: RH PAIN MANAGEMENT Goal: RH STG PAIN MANAGED AT OR BELOW PT'S PAIN GOAL Outcome: Progressing

## 2023-07-10 LAB — BASIC METABOLIC PANEL
Anion gap: 10 (ref 5–15)
BUN: 22 mg/dL (ref 8–23)
CO2: 28 mmol/L (ref 22–32)
Calcium: 9.2 mg/dL (ref 8.9–10.3)
Chloride: 93 mmol/L — ABNORMAL LOW (ref 98–111)
Creatinine, Ser: 0.78 mg/dL (ref 0.61–1.24)
GFR, Estimated: >60 mL/min (ref >60)
Glucose, Bld: 111 mg/dL — ABNORMAL HIGH (ref 70–99)
Potassium: 3.6 mmol/L (ref 3.5–5.1)
Sodium: 131 mmol/L — ABNORMAL LOW (ref 135–145)

## 2023-07-10 LAB — CBC
HCT: 37.2 % — ABNORMAL LOW (ref 39.0–52.0)
Hemoglobin: 12.3 g/dL — ABNORMAL LOW (ref 13.0–17.0)
MCH: 28.7 pg (ref 26.0–34.0)
MCHC: 33.1 g/dL (ref 30.0–36.0)
MCV: 86.7 fL (ref 80.0–100.0)
Platelets: 284 10*3/uL (ref 150–400)
RBC: 4.29 MIL/uL (ref 4.22–5.81)
RDW: 14.7 % (ref 11.5–15.5)
WBC: 3.8 10*3/uL — ABNORMAL LOW (ref 4.0–10.5)
nRBC: 0 % (ref 0.0–0.2)

## 2023-07-10 MED ORDER — ACETAMINOPHEN 325 MG PO TABS: 650.0000 mg | ORAL_TABLET | Freq: Four times a day (QID) | ORAL | Status: DC | PRN

## 2023-07-10 MED ORDER — ACETAMINOPHEN 325 MG PO TABS
325.0000 mg | ORAL_TABLET | Freq: Four times a day (QID) | ORAL | Status: DC | PRN
  Administered 2023-07-11 – 2023-07-12 (×4): 650 mg via ORAL
  Administered 2023-07-13: 325 mg via ORAL
  Administered 2023-07-13: 650 mg via ORAL
  Filled 2023-07-10 (×7): qty 2

## 2023-07-10 MED ORDER — OXYCODONE HCL 5 MG PO TABS
5.0000 mg | ORAL_TABLET | Freq: Four times a day (QID) | ORAL | Status: DC | PRN
  Administered 2023-07-11 – 2023-07-15 (×7): 5 mg via ORAL
  Filled 2023-07-10 (×7): qty 1

## 2023-07-10 NOTE — Progress Notes (Signed)
Daughters in room for family education. Discussed about hypertensive meds; diet. Discussed about wound treatment re: trach site. Discussed about pain medication and side effects. Discussed about diet consistency. Discussed about discharge and day of discharge expectations. No further questions noted.

## 2023-07-10 NOTE — Progress Notes (Signed)
Speech Language Pathology Daily Session Note  Patient Details  Name: [name] MRN: [id number] Date of Birth: [birth year]  Today's Date: 07/10/2023 SLP Individual Time: 1601-0932 SLP Individual Time Calculation (min): 30 min  Short Term Goals: Week 1: SLP Short Term Goal 1 (Week 1): STGs=LTGs d/t ELOS  Skilled Therapeutic Interventions:  Patient was seen in am for family education session addressing dysphagia management and cognitive re- training. Pt was alert and seated upright on EOB with his daughter and daughter in law present for session. Pt immediately inquiring about diet upgrade. SLP educated pt on rationale for current diet and POC. SLP provided education to family and pt on s/sx pen/asp, current diet recommendation, safe swallowing strategies, and foods to avoid. SLP proposed use of visual feedback in upcoming session to facilitate awareness of residuals. SLP also provided education on cognition. Pt presented with intellectual awareness of cognitive deficits and states, "They want me to multitask but I'm just not there yet." SLP reviewed recommendations for organization, routines, and internal and external aids. SLP also discussed nutrition, sleep, and cognitive stimulation as it relates to brain health. All education was supplemented with handouts. Pt's family engaged throughout session by asking thoughtful questions and providing insight. At conclusion of session, pt was left seated upright on EOB with call button within reach and family present. SLP to continue POC.   Pain Pain Assessment Pain Scale: 0-10 Pain Score: 6  Pain Type: Acute pain Pain Location: Head Pain Orientation: Anterior Pain Descriptors / Indicators: Headache Pain Frequency: Intermittent Pain Onset: Gradual Patients Stated Pain Goal: 0 Pain Intervention(s): Medication (See eMAR) Multiple Pain Sites: No  Therapy/Group: Individual Therapy  Renaee Munda 07/10/2023, 12:53 PM

## 2023-07-10 NOTE — Progress Notes (Signed)
PROGRESS NOTE   Subjective/Complaints: No events overnight.  No reported agitation.  Per family, patient doing very well, he is complaining of headache occurring this a.m. after taking Tylenol rather than before.  Family wondering about scheduled oxycodone; reassured this is as needed only.  He was taking this prior at home as well. Sleep log appropriate, 8+ hours, however Patient endorsing poor sleep at nighttime Labs done significant for mild stable hyponatremia 131, otherwise labs within normal limits. Vital stable with some mild hypertension into the 140s overnight. Last bowel movement 12-22.  ROS: + Headache -intermittent.  Denies fevers, chills, N/V, abdominal pain, constipation, diarrhea, SOB, cough, chest pain, new weakness or paraesthesias.    Objective:   No results found.  Recent Labs    07/10/23 0516  WBC 3.8*  HGB 12.3*  HCT 37.2*  PLT 284    Recent Labs    07/10/23 0516  NA 131*  K 3.6  CL 93*  CO2 28  GLUCOSE 111*  BUN 22  CREATININE 0.78  CALCIUM 9.2    Intake/Output Summary (Last 24 hours) at 07/10/2023 0859 Last data filed at 07/10/2023 0740 Gross per 24 hour  Intake 1620 ml  Output --  Net 1620 ml        Physical Exam: Vital Signs Blood pressure 134/82, pulse 92, temperature 98.6 F (37 C), temperature source Oral, resp. rate 18, height 5\' 8"  (1.727 m), weight 67.1 kg, SpO2 99%.  General: No acute distress.  Sitting up at bedside. HEENT: Cervical collar donned, fitting. Heart: Regular rate and rhythm no rubs murmurs or extra sounds Lungs: Clear to auscultation, breathing unlabored, no rales or wheezes.  Intermittent, wet central lung sounds cleared by cough. Abdomen: Positive bowel sounds, soft nontender to palpation, nondistended Extremities: No clubbing, cyanosis, or edema  Skin: No evidence of breakdown, no evidence of rash, no irritation or folliculitis around the beard area   Mepilex underneath cervical collar clean/dry/intact  Neurologic:  Awake, alert, and oriented x 3.  Unable to spell world backwards, but can concentrate through most of the task.  Can follow all simple commands. + Higher level cognitive deficits + Memory deficits + Attention/concentration deficits-improving motor strength is 5/5 bilaterally in upper and lower extremities Mild fine motor deficits and ataxia with left upper extremity greater than right upper extremity  Musculoskeletal: Full range of motion in all 4 extremities. No joint swelling  Assessment/Plan: 1. Functional deficits which require 3+ hours per day of interdisciplinary therapy in a comprehensive inpatient rehab setting. Physiatrist is providing close team supervision and 24 hour management of active medical problems listed below. Physiatrist and rehab team continue to assess barriers to discharge/monitor patient progress toward functional and medical goals  Care Tool:  Bathing    Body parts bathed by patient: Right arm, Left arm, Chest, Abdomen, Front perineal area, Buttocks, Right upper leg, Left upper leg, Right lower leg, Left lower leg, Face         Bathing assist Assist Level: Minimal Assistance - Patient > 75%     Upper Body Dressing/Undressing Upper body dressing   What is the patient wearing?: Pull over shirt    Upper body assist Assist  Level: Moderate Assistance - Patient 50 - 74%    Lower Body Dressing/Undressing Lower body dressing      What is the patient wearing?: Underwear/pull up, Pants     Lower body assist Assist for lower body dressing: Minimal Assistance - Patient > 75%     Toileting Toileting    Toileting assist Assist for toileting: Minimal Assistance - Patient > 75%     Transfers Chair/bed transfer  Transfers assist     Chair/bed transfer assist level: Minimal Assistance - Patient > 75%     Locomotion Ambulation   Ambulation assist      Assist level: Minimal  Assistance - Patient > 75% Assistive device: No Device Max distance: 140'   Walk 10 feet activity   Assist     Assist level: Minimal Assistance - Patient > 75%     Walk 50 feet activity   Assist    Assist level: Minimal Assistance - Patient > 75%      Walk 150 feet activity   Assist Walk 150 feet activity did not occur: Safety/medical concerns (Due to fatigue)         Walk 10 feet on uneven surface  activity   Assist     Assist level: Minimal Assistance - Patient > 75%     Wheelchair     Assist Is the patient using a wheelchair?: Yes Type of Wheelchair: Manual    Wheelchair assist level: Dependent - Patient 0% Max wheelchair distance: 150'    Wheelchair 50 feet with 2 turns activity    Assist        Assist Level: Dependent - Patient 0%   Wheelchair 150 feet activity     Assist      Assist Level: Dependent - Patient 0%   Blood pressure 134/82, pulse 92, temperature 98.6 F (37 C), temperature source Oral, resp. rate 18, height 5\' 8"  (1.727 m), weight 67.1 kg, SpO2 99%.  1. Functional deficits secondary to TBI- [place] with SAH             -patient may  shower             -ELOS/Goals: 10-14 days- supervision-goal date 12-28  - stable to continue CIR 12/20: Family concerned regarding discharge home on dysphagia diet;  P.o. intakes 80 to 90%, so does appear to be getting enough nutrition. Speech to re eval in am  2.  Antithrombotics: -DVT/anticoagulation:  Pharmaceutical: Lovenox             -antiplatelet therapy: N/A 3. New onset headaches/Pain Management: Oxycodone and tylenol prn are effective when administered.              --Question addition of gabapentin for HA/Pain. Topamax added today for daily HA's. . 12/18: No headaches endorsed overnight, continue current regimen 12/19: Headaches increasingly intermittent, well-controlled on current management.  May move Topamax to as needed tomorrow if continues to improve.   Scheduled Tylenol 1000 mg 3 times daily, and reduce oxycodone to 5 mg every 4 hours as needed. 12-20: Headaches much improved, DC Topamax 12-23: Moved Tylenol to 325 to 650 mg every 6 hours as needed for headache and mild to moderate pain.  Decrease oxycodone to 5 mg every 6 hours as needed for severe pain.   4. Mood/Behavior/Sleep: LCSW to follow for evaluation and support.              -antipsychotic agents:  N/A             --  valium being weaned off--has been on 2 mg bid since 12/14. Continue Trazodone 50 mg  12/18: Reduce Valium to 1 mg twice daily tomorrow a.m. for 3 days, then DC  12-20: Scheduled trazodone 50 mg resumed due to poor sleep  12/23: sleep endorses poor, however 8 continuous hours per sleep log.  5. Neuropsych/cognition: This patient is not fully capable of making decisions on his own behalf.  -Tele sitter for safety, continue today  12-19: No impulsive behaviors per staff or family; will monitor 1 additional day, then if remains stable will DC tele sitter  12/20: Add Ritalin 5 mg twice daily for concentration and arousal.  Maintain telemetry sitter 1 to 2 days per therapies, then reassess for DC Arousal no longer an issue will reduce ritalin to qam 12/22 12/23: Doing better with concentration, agree with Ritalin every morning for 2 to 3 days and then DC.  6. Skin/Wound Care: Monitor scalp abrasions which are healing well.  7. Fluids/Electrolytes/Nutrition: Monitor I/O. Intake reported to be reasonable  - 12/18: Mild hyponatremia 132; repeat in AM to trend . -12-19: 130 today, trend again in a.m. and if less will place on fluid restriction and remove trazodone. 12-20: NA 131, stable, resume trazodone.  Repeat Monday a.m. 12/23: NA stable 131  8.  HTN: BP trending up. Amlodipine and Maxide resumed 12/17    - stable on current medication regimen    07/10/2023    6:21 AM 07/09/2023    8:03 PM 07/09/2023    1:25 PM  Vitals with BMI  Systolic 134 145 409  Diastolic 82  83 58  Pulse 92 91 60  Controlled 12/22-23  9. 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    - 12/18: VPA level mildly low, no seizures this admission, management per neurology  10. Superficial vein thrombosis Left cephalic vein: Managed with local measures and pain/edema has resolved.    11. Reactive thrombocytosis: Continue to monitor. On Lovenox bid.     - 2/18 normalized/resolved  12. Anemia: Likely due to critical illness and stable around 9 for the past month --Recheck CBC in am--HgB 12.5; prior 9.5; recheck h/h in AM  13. Pre-diabetes: Hgb A1C- 6.2 last year and has been stable since 2014.  14. BPH: Urinary retention has resolved -->passed voiding trail on 12/4. Monitor I/O.    - continent  15. Remote H/o Polysubstance abuse: Has been sober/off drugs since 2015.  16. L3-L5 TVP Fx/ H/o Lumbar radiculopathy: Has been on gabapentin in the past (Dr. Maurice Small)   17. Resting tachycardia: Monitor for symptoms with increase in activity.    - HR WNL overnight; monitor  18. Liver cysts?: Needs follow up ultrasound in 2 months.  19. Dysphagia with D1 thin diet, however sounds gurgly and needs to clear throat almost constantly- might need to have swallow reassessed?   - 12/20: Per SLP primary barriers concentration, Ritalin initiated as above   12/23: Doing better with secretion management, remains on D1 diet, p.o. intakes adequate/100%.   20. Rhonchorous lung sounds.  Resolved 21.  C2 base fracture cont cervical orthosis per Dr. Wynetta Emery.  Will discuss outpatient reimaging/de-escalation plan prior to discharge.   LOS: 6 days A FACE TO FACE EVALUATION WAS PERFORMED  Angelina Sheriff 07/10/2023, 8:59 AM

## 2023-07-10 NOTE — Progress Notes (Signed)
Occupational Therapy Session Note  Patient Details  Name: [name] MRN: [id number] Date of Birth: Oct 27, 1955  Today's Date: 07/10/2023 OT Individual Time: 5284-1324 + 1300-1355 OT Individual Time Calculation (min): 55 min    Short Term Goals: Week 1:  OT Short Term Goal 1 (Week 1): STG=LTG d/t ELOS  Skilled Therapeutic Interventions/Progress Updates:  Session 1: Pt greeted supine in bed with daughter and DIL present, pt agreeable to OT intervention.      family education provided on the below topics: -always using gait belt for functional mobility -recommendation on use of no AD for functional ambulation - general assist needed for ADLS and functional mobility ( I.e supervision ) -general education provided on brain injury recovery, I.e limiting increased stimulation and lights -demo provided on shower transfer to TTB -fall prevention strategies for bathing -recommended family assist with med mgmt higher level IADLs/problem solving -cervical precautions in relations to ADLs  Family demonstrated appropriate level of assist with ADLs and functional mobility    Ended session with pt supine in bed with all needs within reach and bed alarm activated.                    Session 2: Pt greeted supine in bed, pt agreeable to OT intervention.      Transfers/bed mobility: pt completes supine<>sit with supervision using log roll technique. Pt completed functional ambulation greater than a household distance with no AD and supervision, challenged alternating attention by having pt state animals in relation to indicated letter. Pt with decreased speed during task, often stopping to "think."   Therapeutic activity: pt completed below therapeutic activity to facilitate improved BUE strength/endurance:  3 mins of bounce passes to rebounder to challenge dynamic standing balance and global endurance for higher level ADLs. Pt completed task with CGA no LOB. Graded task up to allow for pt to  follow 2 step commands by having pt bounce ball on floor first and then toss to target. Pt completed task with CGA with no LOB.    ADLs:  Footwear: pt donned shoes from EOB with set- up assist.   IADLs: once in room, pt requesting to make up his bed. Assisted pt with bed making to challenge dynamic balance and BUE strength/coordinaton as pt standing to fold blankets with this therapist. Pt needed MIN verbal cues to problem solve folding blankets and where to place blankets.   Exercises: pt completed below supine therex for BUE strengthening with 3lb dowel rod ( opted to complete therex from supine to decrease neck pain):  -x20 chest presses -x20 shoulder flexion to 90* only d/t cervical precautions -x20 bicep curls -x20 forward rows   3 mins on zoom ball with an emphasis on global strenghtneing and endurance  Ended session with pt supine in bed with all needs within reach and bed alarm activated.                    Therapy Documentation Precautions:  Precautions Precautions: Fall Precaution Comments: C-collar at all times Required Braces or Orthoses: Cervical Brace Cervical Brace: At all times, Hard collar Restrictions Weight Bearing Restrictions Per Provider Order: No  Pain: Session 1: unrated pain reported in head, rest breaks provided as needed Session 2: unrated pain reported in neck and head, rest breaks, repositioning provided as needed.    Therapy/Group: Individual Therapy  Barron Schmid 07/10/2023, 12:20 PM

## 2023-07-10 NOTE — Progress Notes (Signed)
Occupational Therapy Session Note  Patient Details  Name: [name] MRN: [id number] Date of Birth: [birth year]  {[facility] IP REHAB OT TIME CALCULATIONS:[id number]}   Short Term Goals: Week 1:  OT Short Term Goal 1 (Week 1): STG=LTG d/t ELOS  Skilled Therapeutic Interventions/Progress Updates:    Patient agreeable to participate in OT session. Reports *** pain level.   Patient participated in skilled OT session focusing on ***. Therapist facilitated/assessed/developed/educated/integrated/elicited *** in order to improve/facilitate/promote    Therapy Documentation Precautions:  Precautions Precautions: Fall Precaution Comments: C-collar at all times Required Braces or Orthoses: Cervical Brace Cervical Brace: At all times, Hard collar Restrictions Weight Bearing Restrictions Per Provider Order: No  Therapy/Group: Individual Therapy  Limmie Patricia, OTR/L,CBIS  Supplemental OT - [facility] and [facility] Secure Chat Preferred   07/10/2023, 8:30 PM

## 2023-07-10 NOTE — Progress Notes (Addendum)
Physical Therapy Session Note  Patient Details  Name: [name] MRN: [id number] Date of Birth: 11-22-1955  Today's Date: 07/10/2023 PT Individual Time: 1105-1200 PT Individual Time Calculation (min): 55 min   Short Term Goals: Week 1:  PT Short Term Goal 1 (Week 1): STGs = LTGs  Skilled Therapeutic Interventions/Progress Updates:    Pt received seated at EOB and agrees to therapy. No complaint of pain. Family present for family education. PT provides update on pt's progress with therapy as well as recommendations for safe mobility following discharge. Family verbalizes understanding. Pt performs sit to stand with CGA and cues for initiation. Pt ambulates x00' with CGA and cues for navigation, upright gaze to improve posture and balance, and increased trunk rotation and arm swing to decrease risk for falls. Pt completes ramp navigation and car transfer with CGA and cues for safe sequencing. Pt then ambulates to main gym and takes seated rest break. Pt completes x12 6" steps with bilateral hand rails and CGA, with cues for step sequencing and recommendations to family for safe guarding. PT provides other general recommendations for safe recovery from brain injury, including supervision when ambulating outdoors, extra precaution in high risk areas such as kitchen and outdoors, as well as allowing pt to complete as much as possible on his own to foster feelings of independence as well as facilitating functional recovery. Pt performs alternating toe taps on cones with 4lb ankle weights to increase hip flexor strength and increasing balance challenge, tasked with tapping cone contralateral to lower extremity. X20 prior to rest break. Activity progressed by having pt performs toe taps on two cones while in single leg stance with opposite foot. PT provides minA for stability, with cues for lateral weight shifting and engagement of hip abductors. Pt ambulates x400' with ankle weights in place to provide  increased loading through lower extremities. PT provides close supervision and occasional cues to increase step height. Pt left supine in bed with alarm intact and all needs within reach.  Therapy Documentation Precautions:  Precautions Precautions: Fall Precaution Comments: C-collar at all times Required Braces or Orthoses: Cervical Brace Cervical Brace: At all times, Hard collar Restrictions Weight Bearing Restrictions Per Provider Order: No   Therapy/Group: Individual Therapy  Beau Fanny, PT, DPT 07/10/2023, 3:43 PM

## 2023-07-11 MED ORDER — MELATONIN 5 MG PO TABS
5.0000 mg | ORAL_TABLET | Freq: Every evening | ORAL | Status: DC | PRN
  Administered 2023-07-12: 5 mg via ORAL
  Filled 2023-07-11: qty 1

## 2023-07-11 MED ORDER — TRAZODONE HCL 50 MG PO TABS
100.0000 mg | ORAL_TABLET | Freq: Every day | ORAL | Status: DC
  Administered 2023-07-11 – 2023-07-12 (×2): 100 mg via ORAL
  Filled 2023-07-11 (×2): qty 2

## 2023-07-11 NOTE — Patient Instructions (Signed)
PROPER CERVICAL AND SPINAL POSTURE  Good posture positions your head over your shoulders so that your head is not protruding forward. Your ears should be over your shoulders.   Begin by correcting your low back so that it is not slouched. This will correct much of the spine. You may also need to perform a small chin tuck as well.    The image on the right shows how you should position your head and spine throughout the day. This might be difficult at first but over time will get easier as your body adjusts.    PROPER CERVICAL AND SPINAL POSTURE - SEATED  Good posture positions your head over your shoulders so that your head is not protruding forward. Your ears should be over your shoulders.   Begin by correcting your low back so that it is not slouched. This will correct much of the spine. You may also need to perform a small chin tuck as well.    The image on the right shows how you should position your head and spine throughout the day. This might be difficult at first but over time will get easier as your body adjusts.    SCAPULAR RETRACTIONS  Move your shoulder blades back and down. Hold, relax and repeat.  Complete 10-12 times.      1) Seated Row   Sit up straight with elbows by your sides. Pull back with shoulders/elbows, keeping forearms straight, as if pulling back on the reins of a horse. Squeeze shoulder blades together. Repeat _10-12__times, __2-3__sets/day  1) Strengthening: Chest Pull - Resisted   Hold Theraband in front of body with hands about shoulder width a part. Pull band a part and back together slowly. Repeat __10-12__ times. Complete ___1_ set(s) per session.. Repeat ___1_ session(s) per day.  http://orth.exer.us/926   Copyright  VHI. All rights reserved.      3) Resisted External Rotation: in Neutral - Bilateral   Sit or stand, tubing in both hands, elbows at sides, bent to 90, forearms forward. Pinch shoulder blades together and rotate forearms  out. Keep elbows at sides. Repeat _10-12___ times per set. Do __1__ sets per session. Do _1___ sessions per day.

## 2023-07-11 NOTE — Progress Notes (Signed)
Rounding on patient throughout the night is observed to be resting. Occasionally would wake up upon entering the room. Observed covering himself with blanket head to his feet. With AM vitals this morning assessed patient sleep and reports did not sleep through the night. Expressed that the sleep medication "was not enough". Also, assessed if any pain or having a headache. In which does endorse having a headache and patient explains that decrease in sleep is a factor for the headache for him.

## 2023-07-11 NOTE — Discharge Instructions (Addendum)
Inpatient Rehab Discharge Instructions  [name] Discharge date and time:  07/15/23  Activities/Precautions/ Functional Status: Activity: no lifting, driving, or strenuous exercise  till cleared by MD Diet:  Food has to be chopped up into small pieces. Avoid distractions at meals.  Wound Care: keep wound clean and dry   Functional status:  ___ No restrictions     ___ Walk up steps independently _X__ 24/7 supervision/assistance   ___ Walk up steps with assistance ___ Intermittent supervision/assistance  ___ Bathe/dress independently ___ Walk with walker     ___ Bathe/dress with assistance ___ Walk Independently    ___ Shower independently ___ Walk with assistance    _X__ Shower with assistance _X__ No alcohol     ___ Return to work/school ________   Special Instructions: Wear collar at all times.   COMMUNITY REFERRALS UPON DISCHARGE:    Outpatient: PT     OT    ST             Agency:[facility]     [phone]              Appointment Date/Time:*Please expect follow-up within 7-10 business days to schedule your appointment. If you have not received follow-up, be sure to contact the site directly.*    Per [facility] statutes, patients with seizures are not allowed to drive until  they have been seizure-free for six months. Use caution when using heavy equipment or power tools. Avoid working on ladders or at heights. Take showers instead of baths. Ensure the water temperature is not too high on the home water heater. Do not go swimming alone. When caring for infants or small children, sit down when holding, feeding, or changing them to minimize risk of injury to the child in the event you have a seizure. Also, Maintain good sleep hygiene.  Avoid alcohol.     My questions have been answered and I understand these instructions. I will adhere to these goals and the provided educational materials after my discharge from the hospital.  Patient/Caregiver  Signature _______________________________ Date __________  Clinician Signature _______________________________________ Date __________  Please bring this form and your medication list with you to all your follow-up doctor's appointments.

## 2023-07-11 NOTE — Plan of Care (Cosign Needed)
    Problem: RH BOWEL ELIMINATION Goal: RH STG MANAGE BOWEL WITH ASSISTANCE Description: STG Manage Bowel with Assistance. Outcome: Progressing   Problem: RH BOWEL ELIMINATION Goal: RH STG MANAGE BOWEL W/MEDICATION W/ASSISTANCE Description: STG Manage Bowel with Medication with Assistance. Outcome: Progressing   Problem: RH SAFETY Goal: RH STG ADHERE TO SAFETY PRECAUTIONS W/ASSISTANCE/DEVICE Description: STG Adhere to Safety Precautions With Assistance/Device. Outcome: Progressing   Problem: RH BLADDER ELIMINATION Goal: RH STG MANAGE BLADDER WITH ASSISTANCE Description: STG Manage Bladder With Assistance Outcome: Adequate for Discharge   Problem: RH SKIN INTEGRITY Goal: RH STG SKIN FREE OF INFECTION/BREAKDOWN Outcome: Adequate for Discharge

## 2023-07-11 NOTE — Progress Notes (Signed)
Patient ID: [name], male   DOB: Mar 02, 1956, 67 y.o.   MRN: [id number]  1224-  SW spoke with pt son Julion to provide updates from team conference, d/c date remains 12/28, and discussed outpatient therapies. Prefers [facility] for PT/OT/SLP as closet location to their home. SW informed will place FMLA forms in room once completed. Discussed discharge process. No other questions/concerns reported.  *SW met with pt and pt mother in room to provide above updates. No questions/concerns reported.   Cecile Sheerer, MSW, LCSW Office: [phone] Cell: [phone] Fax: [phone]

## 2023-07-11 NOTE — Progress Notes (Signed)
PROGRESS NOTE   Subjective/Complaints: Overnight per nursing: Observed covering himself with blanket head to his feet. With AM vitals this morning assessed patient sleep and reports did not sleep through the night. Expressed that the sleep medication "was not enough". Also, assessed if any pain or having a headache. In which does endorse having a headache and patient explains that decrease in sleep is a factor for the headache for him.   Per sleep log, no sleep overnight.  On a.m. evaluation, patient states that he has no other factors interfering in his sleep, he really has having difficulty both falling and staying asleep.  Denies racing thoughts, pain.   He endorses headaches are worse with multitasking, overstimulation, and light.  Improved with use of sunglasses.  LBM 12/24   ROS: + Headache -intermittent. + Insomnia-ongoing. denies fevers, chills, N/V, abdominal pain, constipation, diarrhea, SOB, cough, chest pain, new weakness or paraesthesias.    Objective:   No results found.  Recent Labs    07/10/23 0516  WBC 3.8*  HGB 12.3*  HCT 37.2*  PLT 284    Recent Labs    07/10/23 0516  NA 131*  K 3.6  CL 93*  CO2 28  GLUCOSE 111*  BUN 22  CREATININE 0.78  CALCIUM 9.2    Intake/Output Summary (Last 24 hours) at 07/11/2023 1007 Last data filed at 07/11/2023 0900 Gross per 24 hour  Intake 2136 ml  Output --  Net 2136 ml        Physical Exam: Vital Signs Blood pressure 115/82, pulse (!) 105, temperature 98.2 F (36.8 C), resp. rate 17, height 5\' 8"  (1.727 m), weight 71.8 kg, SpO2 97%.  General: No acute distress.  Sitting up at bedside.  Pleasant, alert. HEENT: Cervical collar donned, fitting well.  Trach stoma site closed.  Sunglasses donned. Heart: Regular rate and rhythm no rubs murmurs or extra sounds Lungs: Clear to auscultation, breathing unlabored, no rales or wheezes.   Abdomen: Positive bowel  sounds, soft nontender to palpation, nondistended Extremities: No clubbing, cyanosis, or edema  Skin: No evidence of breakdown, no evidence of rash Mepilex underneath cervical collar with minimal amount of serous drainage.  No leak with cough.  Neurologic:  Awake, alert, and oriented x 3.  Can follow all simple commands. + Higher level cognitive deficits-improved + Memory deficits-improved + Attention/concentration deficits-improving motor strength is 5/5 bilaterally in upper and lower extremities-unchanged Mild fine motor deficits and ataxia with left upper extremity greater than right upper extremity  Musculoskeletal: Full range of motion in all 4 extremities. No joint swelling  Assessment/Plan: 1. Functional deficits which require 3+ hours per day of interdisciplinary therapy in a comprehensive inpatient rehab setting. Physiatrist is providing close team supervision and 24 hour management of active medical problems listed below. Physiatrist and rehab team continue to assess barriers to discharge/monitor patient progress toward functional and medical goals  Care Tool:  Bathing    Body parts bathed by patient: Right arm, Left arm, Chest, Abdomen, Front perineal area, Buttocks, Right upper leg, Left upper leg, Right lower leg, Left lower leg, Face         Bathing assist Assist Level: Minimal Assistance -  Patient > 75%     Upper Body Dressing/Undressing Upper body dressing   What is the patient wearing?: Pull over shirt    Upper body assist Assist Level: Moderate Assistance - Patient 50 - 74%    Lower Body Dressing/Undressing Lower body dressing      What is the patient wearing?: Underwear/pull up, Pants     Lower body assist Assist for lower body dressing: Minimal Assistance - Patient > 75%     Toileting Toileting    Toileting assist Assist for toileting: Minimal Assistance - Patient > 75%     Transfers Chair/bed transfer  Transfers assist     Chair/bed  transfer assist level: Minimal Assistance - Patient > 75%     Locomotion Ambulation   Ambulation assist      Assist level: Minimal Assistance - Patient > 75% Assistive device: No Device Max distance: 140'   Walk 10 feet activity   Assist     Assist level: Minimal Assistance - Patient > 75%     Walk 50 feet activity   Assist    Assist level: Minimal Assistance - Patient > 75%      Walk 150 feet activity   Assist Walk 150 feet activity did not occur: Safety/medical concerns (Due to fatigue)         Walk 10 feet on uneven surface  activity   Assist     Assist level: Minimal Assistance - Patient > 75%     Wheelchair     Assist Is the patient using a wheelchair?: Yes Type of Wheelchair: Manual    Wheelchair assist level: Dependent - Patient 0% Max wheelchair distance: 150'    Wheelchair 50 feet with 2 turns activity    Assist        Assist Level: Dependent - Patient 0%   Wheelchair 150 feet activity     Assist      Assist Level: Dependent - Patient 0%   Blood pressure 115/82, pulse (!) 105, temperature 98.2 F (36.8 C), resp. rate 17, height 5\' 8"  (1.727 m), weight 71.8 kg, SpO2 97%.  1. Functional deficits secondary to TBI- [place] with SAH             -patient may  shower             -ELOS/Goals: 10-14 days- supervision-goal date 12-28  - stable to continue CIR 12/20: Family concerned regarding discharge home on dysphagia diet;  P.o. intakes 80 to 90%, so does appear to be getting enough nutrition. 12/24: Slower processing but good carryover and excellent memory. Headache seems light sensitivity related and sunglasses have been helping. SLP trialling D2 textures today, Mod-Max A for swallowing strategies.   2.  Antithrombotics: -DVT/anticoagulation:  Pharmaceutical: Lovenox             -antiplatelet therapy: N/A 3. New onset headaches/Pain Management: Oxycodone and tylenol prn are effective when administered.               --Question addition of gabapentin for HA/Pain. Topamax added today for daily HA's. . 12/18: No headaches endorsed overnight, continue current regimen 12/19: Headaches increasingly intermittent, well-controlled on current management.  May move Topamax to as needed tomorrow if continues to improve.  Scheduled Tylenol 1000 mg 3 times daily, and reduce oxycodone to 5 mg every 4 hours as needed. 12-20: Headaches much improved, DC Topamax 12-23: Moved Tylenol to 325 to 650 mg every 6 hours as needed for headache and mild to moderate  pain.  Decrease oxycodone to 5 mg every 6 hours as needed for severe pain.   4. Mood/Behavior/Sleep: LCSW to follow for evaluation and support.              -antipsychotic agents:  N/A             --valium being weaned off--has been on 2 mg bid since 12/14. Continue Trazodone 50 mg  12/18: Reduce Valium to 1 mg twice daily tomorrow a.m. for 3 days, then DC  12-20: Scheduled trazodone 50 mg resumed due to poor sleep  12/23: sleep endorses poor, however 8 continuous hours per sleep log.  12/24: No sleep overnight.  Increase trazodone to 100 mg nightly, add back as needed melatonin  5. Neuropsych/cognition: This patient is not fully capable of making decisions on his own behalf.  -Tele sitter for safety, continue today  12-19: No impulsive behaviors per staff or family; will monitor 1 additional day, then if remains stable will DC tele sitter  12/20: Add Ritalin 5 mg twice daily for concentration and arousal.  Maintain telemetry sitter 1 to 2 days per therapies, then reassess for DC - Arousal no longer an issue will reduce ritalin to qam 12/22 - 12/23: Doing better with concentration, agree with Ritalin every morning for 2 to 3 days and then DC. 12/24: DC Ritalin due to ongoing insomnia  6. Skin/Wound Care: Monitor scalp abrasions which are healing well.  7. Fluids/Electrolytes/Nutrition: Monitor I/O. Intake reported to be reasonable  - 12/18: Mild hyponatremia 132;  repeat in AM to trend . -12-19: 130 today, trend again in a.m. and if less will place on fluid restriction and remove trazodone. 12-20: NA 131, stable, resume trazodone.  Repeat Monday a.m. 12/23: NA stable 131  8.  HTN: BP trending up. Amlodipine and Maxide resumed 12/17    - stable on current medication regimen    07/11/2023    4:41 AM 07/11/2023    4:32 AM 07/10/2023    7:40 PM  Vitals with BMI  Weight 158 lbs 5 oz    BMI 24.07    Systolic  115 121  Diastolic  82 82  Pulse  105 93  Controlled 12/22-23  9. 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    - 12/18: VPA level mildly low, no seizures this admission, management per neurology  -12/24: Ongoing headache with no focal neurologic deficits.  Minimize pain regimen and limit exacerbating activities as above; significantly improved with use of sunglasses  10. Superficial vein thrombosis Left cephalic vein: Managed with local measures and pain/edema has resolved.    11. Reactive thrombocytosis: Continue to monitor. On Lovenox bid.     - 2/18 normalized/resolved  12. Anemia: Likely due to critical illness and stable around 9 for the past month --Recheck CBC in am--HgB 12.5; prior 9.5; recheck h/h in AM  13. Pre-diabetes: Hgb A1C- 6.2 last year and has been stable since 2014.  14. BPH: Urinary retention has resolved -->passed voiding trail on 12/4. Monitor I/O.    - continent  15. Remote H/o Polysubstance abuse: Has been sober/off drugs since 2015.  16. L3-L5 TVP Fx/ H/o Lumbar radiculopathy: Has been on gabapentin in the past (Dr. Maurice Small)   17. Resting tachycardia: Monitor for symptoms with increase in activity.    - HR WNL overnight; monitor  18. Liver cysts?: Needs follow up ultrasound in 2 months.  19. Dysphagia with D1  thin diet, however sounds gurgly and needs to clear throat almost constantly- might need to have swallow reassessed?  - 12/20: Per  SLP primary barriers concentration, Ritalin initiated as above   - 12/23: Doing better with secretion management, remains on D1 diet, p.o. intakes adequate/100%.   20. Rhonchorous lung sounds.  Resolved 21.  C2 base fracture cont cervical orthosis per Dr. Wynetta Emery.  Will discuss outpatient reimaging/de-escalation plan prior to discharge.   LOS: 7 days A FACE TO FACE EVALUATION WAS PERFORMED  Angelina Sheriff 07/11/2023, 10:07 AM

## 2023-07-11 NOTE — Progress Notes (Signed)
Observed resting in bed at this time. Earlier patient explained conversation with Provider about sleep medication. Talked with patient about plan for sleep medication and how if unable to sleep with scheduled sleep medication to let me know can give PRN medication for sleep. Yesterday pt requesting sleep medication earlier and encouraged to take it later in the evening, which patient did. Lights turned off in room, door closed to minimize noise (with bed alarm on), sleep music playing, and assisted patient with repositioning self in bed to position of comfort.

## 2023-07-11 NOTE — Progress Notes (Signed)
Physical Therapy Session Note  Patient Details  Name: [name] MRN: [id number] Date of Birth: [birth year]  Today's Date: 07/11/2023 PT Individual Time: 1500-1528 PT Individual Time Calculation (min): 28 min   Short Term Goals: Week 1:  PT Short Term Goal 1 (Week 1): STGs = LTGs  Skilled Therapeutic Interventions/Progress Updates:      Pt supine in bed upon arrival, wearing c collar. Pt agreeable to therapy. Pt reports 7/10 headache, premedicated.   Pt ambulated room to day room, dya room to room with no AD and close supervision, verbal cues provided for L LE hip/knee flexion and heel/toe pattern to reduce scuffing L LE.   Pt performed total of 8 minutes on nustep with B UE/LE 4 minutes on workload 5, and 4 minutes on workload 6 for total of 286 steps, intermittent verbal cues provided for correction of R LE positioning on foot pedal.   Pt performed ambulatory transfer throughout session with CGA for stability as pt begins to sit prematurely, vebral cues provided for safety.   Pt supine in bed with all needs within reach and bed alarm on.      Therapy Documentation Precautions:  Precautions Precautions: Fall Precaution Comments: C-collar at all times Required Braces or Orthoses: Cervical Brace Cervical Brace: At all times, Hard collar Restrictions Weight Bearing Restrictions Per Provider Order: No  Therapy/Group: Individual Therapy  [facility] Ambrose Finland, [place], DPT  07/11/2023, 4:58 PM

## 2023-07-11 NOTE — Progress Notes (Signed)
Occupational Therapy Session Note  Patient Details  Name: [name] MRN: [id number] Date of Birth: [birth year]  Today's Date: 07/11/2023 OT Individual Time: 4098-1191 OT Individual Time Calculation (min): 60 min    Short Term Goals: Week 1:  OT Short Term Goal 1 (Week 1): STG=LTG d/t ELOS  Skilled Therapeutic Interventions/Progress Updates:    Pt received sitting EOB eating breakfast with NT present supervising. Took over supervision and pt was able to follow strategies with min cueing. He demonstrated excellent recall of previous conversations with OT, remembering many details. He then completed functional mobility into the bathroom, first using the bathroom and voiding BM. He was able to complete all toileting tasks with (S). Min cueing for sitting to doff underwear and socks. Bathing sit <> stand from TTB (what he will use at home) with close (S). He returned to EOB following with close (S). Transition to supine for c-collar change. Pt requested for OT to assist with shaving d/t the tape around his stoma being painful pulling on hair. Max A shaving provided. He returned to EOB and dressed with (S). He was left supine with all needs met, bed alarm set.   Therapy Documentation Precautions:  Precautions Precautions: Fall Precaution Comments: C-collar at all times Required Braces or Orthoses: Cervical Brace Cervical Brace: At all times, Hard collar Restrictions Weight Bearing Restrictions Per Provider Order: No   Therapy/Group: Individual Therapy  Crissie Reese 07/11/2023, 6:34 AM

## 2023-07-11 NOTE — Patient Care Conference (Cosign Needed)
Inpatient RehabilitationTeam Conference and Plan of Care Update Date: 07/11/2023   Time: 3:42 PM    Patient Name: [name]      Medical Record Number: [id number]  Date of Birth: [birth year] Sex: Male         Room/Bed: 4W23C/4W23C-01 Payor Info: Payor: [employer]vertising copy[employer] MEDICARE / Plan: [facility] MEDICARE / Product Type: *No Product type* /    Admit Date/Time:  07/04/2023  4:20 PM  Primary Diagnosis:  TBI (traumatic brain injury) [facility])  Hospital Problems: Principal Problem:   TBI (traumatic brain injury) (HCC) Active Problems:   SAH (subarachnoid hemorrhage) (HCC)   Dysphagia, pharyngoesophageal phase    Expected Discharge Date: Expected Discharge Date: 07/15/23  Team Members Present: Physician leading conference: Dr. Elijah Birk Social Worker Present: Cecile Sheerer, LCSWA Nurse Present: Chana Bode, RN PT Present: Midge Minium, PT OT Present: Jake Shark, OT SLP Present: Feliberto Gottron, SLP PPS Coordinator present : Fae Pippin, SLP     Current Status/Progress Goal Weekly Team Focus  Bowel/Bladder   Continent of bowel and bladder.   Continue to maintain contience of bowel and bladder.   Assess tolieting needs QShift and As Needed.    Swallow/Nutrition/ Hydration   Dys. 1 textures/thin liquids with trial tray of Dys. 2 textures today for lunch, Min A for use of safe swallowing strategies consistently   diet upgrade, min assist  upgrade diet, tolerance of upgraded solid textures, consistent use of recommended swallow strategies    ADL's   (S) overall, occasional CGA. Cueing for selective attention and slower processing   (S) to mod I   ADLs, fam edu, balance, strength    Mobility   supervision bed mobility, CGA transfers and ambulation without and AD, very occsaional minA with fatigue or distraction   supervision  high level balance and ambulation with cognitive multitasking, DC prep    Communication   reduced intelligibility at all utterance length  levels;Min A assist for use of strategies at the phrase level   min assist   consistent use of SLOP speech intelligibility strategies across utterance lengths    Safety/Cognition/ Behavioral Observations  Min-Mod A assist for sustained attention, processing speed, and problem solving, mod I for orientation and functional recall   min assist overall   problem solving, sustained attention, processing speed, memory of daily events    Pain   Pain is intermitent throughout the day and night. Pain associated with headaches.   Work towards pain goal of 3 or less.   Assess QShift and As Needed pain level provide comfort for patient.    Skin   No skin issues outside of incision site to neck from area where tracheotomy was placed. Pink area of skin to top of head.   Ensure no further breakdown of skin to prevent infection. Continue to assess and treat current incision site per orders.  Assess skin QShift and as needed.      Discharge Planning:  Pt will d/c to home with support from pt son who will take FMLA, and PRN support from various family members. Fam edu completed on 12/23 with pt granddaughter and DIL. SW will confirm there are no barriers to discharge.   Team Discussion: Patient post TBI with hyponatremia, dysphagia, and light sensitivity.  Patient on target to meet rehab goals: yes, currently managing secretions better, with non-pharmacological management of Headaches. Still needs min - mod assist for cognition and cues for selective attention with good carry over and CGA - min assist in  a distracting environment .  Needs mod - max assist for swallowing and min assist for speech intelligibility.  Goals for discharge set for supervision overall.   *See Care Plan and progress notes for long and short-term goals.   Revisions to Treatment Plan:  Sleep assessment log Ritalin tapering Wean off meds; d/c topamax Dysphagia 2 diet trial   Teaching Needs: Safety, medication, diet  modification, skin/wound care, transfers, toileting, etc.   Current Barriers to Discharge: Decreased caregiver support  Possible Resolutions to Barriers: Family education OP follow up services     Medical Summary Current Status: medically complicated by dysphagia with poor PO intakes, pain control.headaches, insomnia,   hyponatremia,and cognitive deficits  Barriers to Discharge: Inadequate Nutritional Intake;Medical stability;Self-care education;Behavior/Mood;Uncontrolled Pain   Possible Resolutions to [employer]: Using non-pharmacologic strategies for headache management, weaning pain medications, advancing diet, monitorring sodium   Continued Need for Acute Rehabilitation Level of Care: The patient requires daily medical management by a physician with specialized training in physical medicine and rehabilitation for the following reasons: Direction of a multidisciplinary physical rehabilitation program to maximize functional independence : Yes Medical management of patient stability for increased activity during participation in an intensive rehabilitation regime.: Yes Analysis of laboratory values and/or radiology reports with any subsequent need for medication adjustment and/or medical intervention. : Yes   I attest that I was present, lead the team conference, and concur with the assessment and plan of the team.   Chana Bode B 07/11/2023, 3:42 PM

## 2023-07-11 NOTE — Progress Notes (Signed)
Speech Language Pathology Daily Session Note  Patient Details  Name: [name] MRN: [id number] Date of Birth: 22-Mar-1956  Today's Date: 07/11/2023 SLP Individual Time: 1235-1309 SLP Individual Time Calculation (min): 34 min  Short Term Goals: Week 1: SLP Short Term Goal 1 (Week 1): STGs=LTGs d/t ELOS  Skilled Therapeutic Interventions: Patient was seen in PM to address dysphagia management. Pt was alert and seated upright in recliner upon arrival. Session briefly delayed due to receiving wrong diet tray and awaiting correct tray. Upon receiving correct tray, SLP provided mirror for visual feedback. SLP reviewed recommendations for slow rate, small single boluses, and throat clear after swallow. Pt administered all trials with initial good utilization of strategies. He required extensive time and multiple swallows in between boluses before achieving oral clearance. When prompted he took sips of thin liquid between trials which also assisted in oral clearance. As session progressed, pt with decreased utilization of strategies warranting increased verbal cues for small bites and slow rate with pt able to return demo. He consumed 100% of meal without s/s aspiration. SLP recommending continuation of D1 diet and thin liquids with plan to continue advanced trials with SLP only.   Pain Pain Assessment Pain Scale: 0-10 Pain Score: 7  Pain Type: Acute pain Pain Location: Head Pain Orientation: Anterior Pain Descriptors / Indicators: Headache Pain Frequency: Constant Pain Onset: Gradual Patients Stated Pain Goal: 0 Pain Intervention(s): Medication (See eMAR)  Therapy/Group: Individual Therapy  Renaee Munda 07/11/2023, 3:43 PM

## 2023-07-12 MED ORDER — DIPHENHYDRAMINE HCL 25 MG PO CAPS
25.0000 mg | ORAL_CAPSULE | Freq: Every day | ORAL | Status: DC
Start: 2023-07-12 — End: 2023-07-13
  Administered 2023-07-12: 25 mg via ORAL
  Filled 2023-07-12: qty 1

## 2023-07-12 NOTE — Progress Notes (Signed)
PROGRESS NOTE   Subjective/Complaints: Per nursing reports, slept better overnight, per patient only endorses 1 hour of improved sleep.  Wise, no acute complaints.  No events overnight. Vital stable. Eating 100% of meals.  Continent of bowel and bladder, last bowel movement 12-24.  ROS: + Headache -intermittent. + Insomnia-ongoing. denies fevers, chills, N/V, abdominal pain, constipation, diarrhea, SOB, cough, chest pain, new weakness or paraesthesias.    Objective:   No results found.  Recent Labs    07/10/23 0516  WBC 3.8*  HGB 12.3*  HCT 37.2*  PLT 284    Recent Labs    07/10/23 0516  NA 131*  K 3.6  CL 93*  CO2 28  GLUCOSE 111*  BUN 22  CREATININE 0.78  CALCIUM 9.2    Intake/Output Summary (Last 24 hours) at 07/12/2023 0806 Last data filed at 07/12/2023 0737 Gross per 24 hour  Intake 956 ml  Output --  Net 956 ml        Physical Exam: Vital Signs Blood pressure 130/86, pulse (!) 102, temperature 97.6 F (36.4 C), temperature source Oral, resp. rate 17, height 5\' 8"  (1.727 m), weight 72 kg, SpO2 98%.  General: No acute distress.  Sitting upright at bedside. HEENT: Cervical collar donned, fitting well.  Trach stoma site closed-operative Mepilex. Heart: Regular rate and rhythm no rubs murmurs or extra sounds Lungs: Clear to auscultation, breathing unlabored, no rales or wheezes.   Abdomen: Positive bowel sounds, soft nontender to palpation, nondistended Extremities: No clubbing, cyanosis, or edema  Skin: No evidence of breakdown, no evidence of rash Mepilex underneath cervical collar with minimal amount of serous drainage.  No leak with cough.  Neurologic:  Awake, alert, and oriented x 3.  Can follow all simple commands.  No apparent deficits. motor strength is 5/5 bilaterally in upper and lower extremities-unchanged Mild fine motor deficits and ataxia with left upper extremity greater than right  upper extremity  Musculoskeletal: Full range of motion in all 4 extremities. No joint swelling  Assessment/Plan: 1. Functional deficits which require 3+ hours per day of interdisciplinary therapy in a comprehensive inpatient rehab setting. Physiatrist is providing close team supervision and 24 hour management of active medical problems listed below. Physiatrist and rehab team continue to assess barriers to discharge/monitor patient progress toward functional and medical goals  Care Tool:  Bathing    Body parts bathed by patient: Right arm, Left arm, Chest, Abdomen, Front perineal area, Buttocks, Right upper leg, Left upper leg, Right lower leg, Left lower leg, Face         Bathing assist Assist Level: Minimal Assistance - Patient > 75%     Upper Body Dressing/Undressing Upper body dressing   What is the patient wearing?: Pull over shirt    Upper body assist Assist Level: Moderate Assistance - Patient 50 - 74%    Lower Body Dressing/Undressing Lower body dressing      What is the patient wearing?: Underwear/pull up, Pants     Lower body assist Assist for lower body dressing: Minimal Assistance - Patient > 75%     Toileting Toileting    Toileting assist Assist for toileting: Minimal Assistance - Patient >  75%     Transfers Chair/bed transfer  Transfers assist     Chair/bed transfer assist level: Minimal Assistance - Patient > 75%     Locomotion Ambulation   Ambulation assist      Assist level: Minimal Assistance - Patient > 75% Assistive device: No Device Max distance: 140'   Walk 10 feet activity   Assist     Assist level: Minimal Assistance - Patient > 75%     Walk 50 feet activity   Assist    Assist level: Minimal Assistance - Patient > 75%      Walk 150 feet activity   Assist Walk 150 feet activity did not occur: Safety/medical concerns (Due to fatigue)         Walk 10 feet on uneven surface  activity   Assist      Assist level: Minimal Assistance - Patient > 75%     Wheelchair     Assist Is the patient using a wheelchair?: Yes Type of Wheelchair: Manual    Wheelchair assist level: Dependent - Patient 0% Max wheelchair distance: 150'    Wheelchair 50 feet with 2 turns activity    Assist        Assist Level: Dependent - Patient 0%   Wheelchair 150 feet activity     Assist      Assist Level: Dependent - Patient 0%   Blood pressure 130/86, pulse (!) 102, temperature 97.6 F (36.4 C), temperature source Oral, resp. rate 17, height 5\' 8"  (1.727 m), weight 72 kg, SpO2 98%.  1. Functional deficits secondary to TBI- [place] with SAH             -patient may  shower             -ELOS/Goals: 10-14 days- supervision-goal date 12-28  - stable to continue CIR 12/20: Family concerned regarding discharge home on dysphagia diet;  P.o. intakes 80 to 90%, so does appear to be getting enough nutrition. 12/24: Slower processing but good carryover and excellent memory. Headache seems light sensitivity related and sunglasses have been helping. SLP trialling D2 textures today, Mod-Max A for swallowing strategies.   2.  Antithrombotics: -DVT/anticoagulation:  Pharmaceutical: Lovenox             -antiplatelet therapy: N/A 3. New onset headaches/Pain Management: Oxycodone and tylenol prn are effective when administered.              --Question addition of gabapentin for HA/Pain. Topamax added today for daily HA's. . 12/18: No headaches endorsed overnight, continue current regimen 12/19: Headaches increasingly intermittent, well-controlled on current management.  May move Topamax to as needed tomorrow if continues to improve.  Scheduled Tylenol 1000 mg 3 times daily, and reduce oxycodone to 5 mg every 4 hours as needed. 12-20: Headaches much improved, DC Topamax 12-23: Moved Tylenol to 325 to 650 mg every 6 hours as needed for headache and mild to moderate pain.  Decrease oxycodone to 5  mg every 6 hours as needed for severe pain.   4. Mood/Behavior/Sleep: LCSW to follow for evaluation and support.              -antipsychotic agents:  N/A             --valium being weaned off--has been on 2 mg bid since 12/14. Continue Trazodone 50 mg  12/18: Reduce Valium to 1 mg twice daily tomorrow a.m. for 3 days, then DC  12-20: Scheduled trazodone 50 mg resumed due  to poor sleep  12/23: sleep endorses poor, however 8 continuous hours per sleep log.  12/24: No sleep overnight.  Increase trazodone to 100 mg nightly, add back as needed melatonin  12/25: Mild improvement with increased trazodone; change as needed melatonin to diphenhydramine.  5. Neuropsych/cognition: This patient is not fully capable of making decisions on his own behalf.  -Tele sitter for safety, continue today  12-19: No impulsive behaviors per staff or family; will monitor 1 additional day, then if remains stable will DC tele sitter  12/20: Add Ritalin 5 mg twice daily for concentration and arousal.  Maintain telemetry sitter 1 to 2 days per therapies, then reassess for DC - Arousal no longer an issue will reduce ritalin to qam 12/22 - 12/23: Doing better with concentration, agree with Ritalin every morning for 2 to 3 days and then DC. 12/24: DC Ritalin due to ongoing insomnia  6. Skin/Wound Care: Monitor scalp abrasions which are healing well.  7. Fluids/Electrolytes/Nutrition: Monitor I/O. Intake reported to be reasonable  - 12/18: Mild hyponatremia 132; repeat in AM to trend . -12-19: 130 today, trend again in a.m. and if less will place on fluid restriction and remove trazodone. 12-20: NA 131, stable, resume trazodone.  Repeat Monday a.m. 12/23: NA stable 131  8.  HTN: BP trending up. Amlodipine and Maxide resumed 12/17    - stable on current medication regimen    07/12/2023    5:00 AM 07/12/2023    2:53 AM 07/11/2023    7:55 PM  Vitals with BMI  Weight 158 lbs 12 oz    BMI 24.14    Systolic  130 121   Diastolic  86 70  Pulse  102 98  Controlled 12/22-23  9. 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    - 12/18: VPA level mildly low, no seizures this admission, management per neurology  -12/24: Ongoing headache with no focal neurologic deficits.  Minimize pain regimen and limit exacerbating activities as above; significantly improved with use of sunglasses  10. Superficial vein thrombosis Left cephalic vein: Managed with local measures and pain/edema has resolved.    11. Reactive thrombocytosis: Continue to monitor. On Lovenox bid.     - 2/18 normalized/resolved  12. Anemia: Likely due to critical illness and stable around 9 for the past month --Recheck CBC in am--HgB 12.5; prior 9.5; recheck h/h in AM  13. Pre-diabetes: Hgb A1C- 6.2 last year and has been stable since 2014.  14. BPH: Urinary retention has resolved -->passed voiding trail on 12/4. Monitor I/O.    - continent  15. Remote H/o Polysubstance abuse: Has been sober/off drugs since 2015.  16. L3-L5 TVP Fx/ H/o Lumbar radiculopathy: Has been on gabapentin in the past (Dr. Maurice Small)   17. Resting tachycardia: Monitor for symptoms with increase in activity.    - HR WNL overnight; monitor  18. Liver cysts?: Needs follow up ultrasound in 2 months.   19. Dysphagia with D1 thin diet, however sounds gurgly and needs to clear throat almost constantly- might need to have swallow reassessed?  - 12/20: Per SLP primary barriers concentration, Ritalin initiated as above   - 12/23: Doing better with secretion management, remains on D1 diet, p.o. intakes adequate/100%.   20. Rhonchorous lung sounds.  Resolved  21.  C2 base fracture cont cervical orthosis per Dr. Wynetta Emery.  Will discuss outpatient reimaging/de-escalation plan prior to discharge.   LOS: 8 days  A FACE TO FACE EVALUATION WAS PERFORMED  Angelina Sheriff 07/12/2023, 8:06 AM

## 2023-07-13 LAB — BASIC METABOLIC PANEL
Anion gap: 10 (ref 5–15)
BUN: 17 mg/dL (ref 8–23)
CO2: 28 mmol/L (ref 22–32)
Calcium: 9 mg/dL (ref 8.9–10.3)
Chloride: 96 mmol/L — ABNORMAL LOW (ref 98–111)
Creatinine, Ser: 0.76 mg/dL (ref 0.61–1.24)
GFR, Estimated: >60 mL/min (ref >60)
Glucose, Bld: 104 mg/dL — ABNORMAL HIGH (ref 70–99)
Potassium: 3.7 mmol/L (ref 3.5–5.1)
Sodium: 134 mmol/L — ABNORMAL LOW (ref 135–145)

## 2023-07-13 LAB — CBC
HCT: 37.5 % — ABNORMAL LOW (ref 39.0–52.0)
Hemoglobin: 12.2 g/dL — ABNORMAL LOW (ref 13.0–17.0)
MCH: 28.4 pg (ref 26.0–34.0)
MCHC: 32.5 g/dL (ref 30.0–36.0)
MCV: 87.4 fL (ref 80.0–100.0)
Platelets: 259 10*3/uL (ref 150–400)
RBC: 4.29 MIL/uL (ref 4.22–5.81)
RDW: 15.1 % (ref 11.5–15.5)
WBC: 4.2 10*3/uL (ref 4.0–10.5)
nRBC: 0 % (ref 0.0–0.2)

## 2023-07-13 MED ORDER — TRAZODONE HCL 50 MG PO TABS
50.0000 mg | ORAL_TABLET | Freq: Every evening | ORAL | Status: DC | PRN
  Administered 2023-07-13: 50 mg via ORAL
  Filled 2023-07-13: qty 1

## 2023-07-13 MED ORDER — VALPROIC ACID 250 MG PO CAPS
250.0000 mg | ORAL_CAPSULE | Freq: Two times a day (BID) | ORAL | Status: DC
Start: 2023-07-13 — End: 2023-07-15
  Administered 2023-07-13 – 2023-07-15 (×4): 250 mg via ORAL
  Filled 2023-07-13 (×4): qty 1

## 2023-07-13 NOTE — Progress Notes (Signed)
Occupational Therapy Discharge Summary  Patient Details  Name: [name] MRN: [id number] Date of Birth: 1955/08/18  Date of Discharge from OT service:July 14, 2023   Patient has met 8 of 8 long term goals due to improved activity tolerance, improved balance, postural control, ability to compensate for deficits, improved attention, improved awareness, and improved coordination.  Patient to discharge at overall Supervision level.  Patient's care partner is independent to provide the necessary cognitive assistance at discharge.  [name] has made great progress overall and progressed to a RLAS VIII and a (S) level for all ADLs and transfers. He has great family support at home who will provide the (S) he requires.   Recommendation:  Patient will benefit from ongoing skilled OT services in outpatient setting to continue to advance functional skills in the area of BADL and iADL.  Equipment: No equipment provided  Reasons for discharge: treatment goals met and discharge from hospital  Patient/family agrees with progress made and goals achieved: Yes  OT Discharge Precautions/Restrictions  Precautions Precautions: Fall Precaution Comments: C-collar at all times Required Braces or Orthoses: Cervical Brace Cervical Brace: At all times;Hard collar Restrictions Weight Bearing Restrictions Per Provider Order: No ADL ADL Eating: Supervision/safety Where Assessed-Eating: Chair Grooming: Supervision/safety Where Assessed-Grooming: Standing at sink Upper Body Bathing: Supervision/safety Where Assessed-Upper Body Bathing: Shower Lower Body Bathing: Supervision/safety Where Assessed-Lower Body Bathing: Shower Upper Body Dressing: Supervision/safety Where Assessed-Upper Body Dressing: Edge of bed Lower Body Dressing: Supervision/safety Where Assessed-Lower Body Dressing: Edge of bed Toileting: Supervision/safety Where Assessed-Toileting: Teacher, adult education: Distant  supervision Statistician Method: Proofreader: Chiropractor Transfer: Distant supervision Tub/Shower Transfer Method: Ship broker: Insurance underwriter: Distant supervision Film/video editor Method: Designer, industrial/product: Sales promotion account executive Baseline Vision/History: 1 Wears glasses Patient Visual Report: No change from baseline Vision Assessment?: No apparent visual deficits Perception  Perception: Within Functional Limits Praxis Praxis: WFL Cognition Cognition Overall Cognitive Status: Impaired/Different from baseline Arousal/Alertness: Awake/alert Orientation Level: Person;Place;Situation Person: Oriented Place: Oriented Situation: Oriented Memory: Impaired Memory Impairment: Storage deficit;Retrieval deficit;Decreased recall of new information Attention: Alternating Awareness: Impaired Awareness Impairment: Anticipatory impairment Problem Solving: Impaired Problem Solving Impairment: Functional complex;Verbal complex Safety/Judgment: Impaired [place] [id number] Scales of Cognitive Functioning: Purposeful, Appropriate: Stand-by Assistance Brief Interview for Mental Status (BIMS) Repetition of Three Words (First Attempt): 3 Temporal Orientation: Year: Correct Temporal Orientation: Month: Accurate within 5 days Temporal Orientation: Day: Correct Recall: "Sock": Yes, no cue required Recall: "Blue": Yes, no cue required Recall: "Bed": No, could not recall BIMS Summary Score: 13 Sensation Sensation Light Touch: Appears Intact Hot/Cold: Appears Intact Proprioception: Appears Intact Stereognosis: Appears Intact Additional Comments: R box and blocks: 29, L 29 Coordination Gross Motor Movements are Fluid and Coordinated: No Fine Motor Movements are Fluid and Coordinated: No Coordination and Movement Description: reduced coordination overall with generalized  weakness Finger Nose Finger Test: Slow but overall WFL 9 Hole Peg Test: L: 32 seconds, R: 32 seconds Motor  Motor Motor: Other (comment) Motor - Skilled Clinical Observations: generalized weakness with reduced balance Mobility  Transfers Sit to Stand: Supervision/Verbal cueing Stand to Sit: Supervision/Verbal cueing  Trunk/Postural Assessment  Cervical Assessment Cervical Assessment: Within Functional Limits Thoracic Assessment Thoracic Assessment: Within Functional Limits Lumbar Assessment Lumbar Assessment: Within Functional Limits Postural Control Postural Control: Within Functional Limits  Balance Balance Balance Assessed: Yes Static Sitting Balance Static Sitting - Balance Support: Feet supported Static Sitting - Level of Assistance: 6: Modified  independent (Device/Increase time) Dynamic Sitting Balance Dynamic Sitting - Balance Support: Feet supported Dynamic Sitting - Level of Assistance: 6: Modified independent (Device/Increase time) Static Standing Balance Static Standing - Balance Support: During functional activity Static Standing - Level of Assistance: 6: Modified independent (Device/Increase time) Dynamic Standing Balance Dynamic Standing - Balance Support: During functional activity Dynamic Standing - Level of Assistance: 5: Stand by assistance Extremity/Trunk Assessment RUE Assessment RUE Assessment: Exceptions to [facility] General Strength Comments: generalized weakness, 4/5 LUE Assessment LUE Assessment: Exceptions to [facility] General Strength Comments: generalized 4/5   Crissie Reese 07/13/2023, 12:30 PM

## 2023-07-13 NOTE — Progress Notes (Signed)
Physical Therapy Session Note  Patient Details  Name: [name] MRN: [id number] Date of Birth: September 29, 1955  Today's Date: 07/13/2023 PT Individual Time: 1302-1358 PT Individual Time Calculation (min): 56 min   Short Term Goals: Week 1:  PT Short Term Goal 1 (Week 1): STGs = LTGs  Skilled Therapeutic Interventions/Progress Updates:     Pt received seated at EOB and agrees to therapy. No complaint of pain. Pt performs sit to stand with cues for initiation, then ambulates x350' with cues for upright gaze to improve posture and balance, and increasing trunk rotation and arm swing to decrease risk for falls. Pt completes Nustep for endurance training and challenging attention to task. Pt completes x10:00 at workload of 5 with average step per minute ~36. PT provides cues for hand and foot placement and completing full available ROM. Pt then completes Berg balance test and achieves score of 46/56, up from 27/56 on previous attempt last week, demonstrating a much larger improvement than the MCID. Seated rest break. Pt completes 6 Minute Walk test with score of 1650'. Seated rest break. Pt ambulates back to room. Left seated at edge of bed with alarm intact.   Therapy Documentation Precautions:  Precautions Precautions: Fall Precaution Comments: C-collar at all times Required Braces or Orthoses: Cervical Brace Cervical Brace: At all times, Hard collar Restrictions Weight Bearing Restrictions Per Provider Order: No   Therapy/Group: Individual Therapy Beau Fanny, PT, DTP 07/13/2023, 1:23 PM

## 2023-07-13 NOTE — Progress Notes (Signed)
Physical Therapy Note  Patient Details  Name: [name] MRN: [id number] Date of Birth: 12-06-1955  Today's Date: 07/13/2023 PT Individual Time: 4098-1191 PT Individual Time Calculation (min): 30 min   Short Term Goals: Week 1:  PT Short Term Goal 1 (Week 1): STGs = LTGs  Skilled Therapeutic Interventions/Progress Updates:    Pt received sitting in recliner awake and agreeable to therapy session. Pt wearing cervical collar throughout session. Sit<>stands, no AD, with close SBA for safety throughout session. Gait training ~117ft to main therapy gym, no AD, with CGA for steadying/safety - slight postural instability and slightly wider BOS. Pt fatigues quickly after this amount of activity requiring seated rest break and having slightly labored breathing.  Dynamic gait training using agility ladder including the following:  - forward reciprocal pattern, no UE support, down/back x2 reps with good foot clearance bilaterally (only catching L foot x1) - side stepping down/back x2 reps - requires cuing for increased step length bilaterally - backwards step-to pattern down/back x1 leading with each foot *Requires CGA for safety with a few instances of light min A  Gait training back to his room as described above with therapist providing cuing for increased arm swing, upright posture, and increased gait speed to advance endurance.  Pt requesting to lie down and rest - left supine in bed with needs in reach, bed alarm on, and cervical collar in place.   Therapy Documentation Precautions:  Precautions Precautions: Fall Precaution Comments: C-collar at all times Required Braces or Orthoses: Cervical Brace Cervical Brace: At all times, Hard collar Restrictions Weight Bearing Restrictions Per Provider Order: No   Pain: Denies pain during session.    Therapy/Group: Individual Therapy  Ginny Forth , PT, DPT, NCS, CSRS 07/13/2023, 8:05 AM

## 2023-07-13 NOTE — Progress Notes (Signed)
Patient ID: [name], male   DOB: [birth year], 67 y.o.   MRN: [id number]  SW faxed outpatient PT/OT/SLP referral to [facility] (p:[phone]/f:[phone]).   *SW left completed FMLA forms for pt son in room.   Cecile Sheerer, MSW, LCSW Office: [phone] Cell: [phone] Fax: [phone]

## 2023-07-13 NOTE — Progress Notes (Signed)
PROGRESS NOTE   Subjective/Complaints: No acute complaints.  No events overnight.  Vital stable, feeling prepared for discharge. States that medication adjustment did not help with sleep overnight; says "forgot about it", will likely sleep better when he gets home.  ROS: + Headache -intermittent, improving. + Insomnia-ongoing. denies fevers, chills, N/V, abdominal pain, constipation, diarrhea, SOB, cough, chest pain, new weakness or paraesthesias.    Objective:   No results found.  Recent Labs    07/13/23 0545  WBC 4.2  HGB 12.2*  HCT 37.5*  PLT 259    Recent Labs    07/13/23 0545  NA 134*  K 3.7  CL 96*  CO2 28  GLUCOSE 104*  BUN 17  CREATININE 0.76  CALCIUM 9.0    Intake/Output Summary (Last 24 hours) at 07/13/2023 1651 Last data filed at 07/13/2023 1319 Gross per 24 hour  Intake 720 ml  Output --  Net 720 ml        Physical Exam: Vital Signs Blood pressure 115/75, pulse 90, temperature 97.7 F (36.5 C), resp. rate 18, height 5\' 8"  (1.727 m), weight 71.9 kg, SpO2 96%.  General: No acute distress.  Walking in gym. HEENT: Cervical collar donned, fitting well.  Trach stoma site closed Heart: Regular rate and rhythm no rubs murmurs or extra sounds Lungs: Clear to auscultation, breathing unlabored, no rales or wheezes.   Abdomen: Positive bowel sounds, soft nontender to palpation, nondistended Extremities: No clubbing, cyanosis, or edema  Skin: No evidence of breakdown, no evidence of rash Mepilex underneath cervical collar Clean.  No leak with cough.  Neurologic:  Awake, alert, and oriented x 3.  Can follow all simple commands.  No apparent deficits. motor strength is 5/5 bilaterally in upper and lower extremities No apparent fine motor deficits or ataxia Musculoskeletal: Full range of motion in all 4 extremities. No joint swelling  Assessment/Plan: 1. Functional deficits which require 3+ hours  per day of interdisciplinary therapy in a comprehensive inpatient rehab setting. Physiatrist is providing close team supervision and 24 hour management of active medical problems listed below. Physiatrist and rehab team continue to assess barriers to discharge/monitor patient progress toward functional and medical goals  Care Tool:  Bathing    Body parts bathed by patient: Right arm, Left arm, Chest, Abdomen, Front perineal area, Buttocks, Right upper leg, Left upper leg, Right lower leg, Left lower leg, Face         Bathing assist Assist Level: Supervision/Verbal cueing     Upper Body Dressing/Undressing Upper body dressing   What is the patient wearing?: Pull over shirt    Upper body assist Assist Level: Supervision/Verbal cueing    Lower Body Dressing/Undressing Lower body dressing      What is the patient wearing?: Underwear/pull up, Pants     Lower body assist Assist for lower body dressing: Supervision/Verbal cueing     Toileting Toileting    Toileting assist Assist for toileting: Supervision/Verbal cueing     Transfers Chair/bed transfer  Transfers assist     Chair/bed transfer assist level: Supervision/Verbal cueing     Locomotion Ambulation   Ambulation assist      Assist level: Minimal Assistance -  Patient > 75% Assistive device: No Device Max distance: 140'   Walk 10 feet activity   Assist     Assist level: Minimal Assistance - Patient > 75%     Walk 50 feet activity   Assist    Assist level: Minimal Assistance - Patient > 75%      Walk 150 feet activity   Assist Walk 150 feet activity did not occur: Safety/medical concerns (Due to fatigue)         Walk 10 feet on uneven surface  activity   Assist     Assist level: Minimal Assistance - Patient > 75%     Wheelchair     Assist Is the patient using a wheelchair?: Yes Type of Wheelchair: Manual    Wheelchair assist level: Dependent - Patient 0% Max  wheelchair distance: 150'    Wheelchair 50 feet with 2 turns activity    Assist        Assist Level: Dependent - Patient 0%   Wheelchair 150 feet activity     Assist      Assist Level: Dependent - Patient 0%   Blood pressure 115/75, pulse 90, temperature 97.7 F (36.5 C), resp. rate 18, height 5\' 8"  (1.727 m), weight 71.9 kg, SpO2 96%.  1. Functional deficits secondary to TBI- [place] with SAH             -patient may  shower             -ELOS/Goals: 10-14 days- supervision-goal date 12-28  - stable to continue CIR 12/20: Family concerned regarding discharge home on dysphagia diet;  P.o. intakes 80 to 90%, so does appear to be getting enough nutrition. 12/24: Slower processing but good carryover and excellent memory. Headache seems light sensitivity related and sunglasses have been helping. SLP trialling D2 textures today, Mod-Max A for swallowing strategies.   2.  Antithrombotics: -DVT/anticoagulation:  Pharmaceutical: Lovenox             -antiplatelet therapy: N/A 3. New onset headaches/Pain Management: Oxycodone and tylenol prn are effective when administered.              --Question addition of gabapentin for HA/Pain. Topamax added today for daily HA's. . 12/18: No headaches endorsed overnight, continue current regimen 12/19: Headaches increasingly intermittent, well-controlled on current management.  May move Topamax to as needed tomorrow if continues to improve.  Scheduled Tylenol 1000 mg 3 times daily, and reduce oxycodone to 5 mg every 4 hours as needed. 12-20: Headaches much improved, DC Topamax 12-23: Moved Tylenol to 325 to 650 mg every 6 hours as needed for headache and mild to moderate pain.  Decrease oxycodone to 5 mg every 6 hours as needed for severe pain. Symptoms well-controlled   4. Mood/Behavior/Sleep: LCSW to follow for evaluation and support.              -antipsychotic agents:  N/A             --valium being weaned off--has been on 2 mg bid  since 12/14. Continue Trazodone 50 mg  12/18: Reduce Valium to 1 mg twice daily tomorrow a.m. for 3 days, then DC  12-20: Scheduled trazodone 50 mg resumed due to poor sleep  12/23: sleep endorses poor, however 8 continuous hours per sleep log.  12/24: No sleep overnight.  Increase trazodone to 100 mg nightly, add back as needed melatonin  12/25: Mild improvement with increased trazodone; change as needed melatonin to diphenhydramine.  12/26:  No improvement.  Move trazodone to 50 mg as needed, DC diphenhydramine  5. Neuropsych/cognition: This patient is not fully capable of making decisions on his own behalf.  -Tele sitter for safety, continue today  12-19: No impulsive behaviors per staff or family; will monitor 1 additional day, then if remains stable will DC tele sitter  12/20: Add Ritalin 5 mg twice daily for concentration and arousal.  Maintain telemetry sitter 1 to 2 days per therapies, then reassess for DC - Arousal no longer an issue will reduce ritalin to qam 12/22 - 12/23: Doing better with concentration, agree with Ritalin every morning for 2 to 3 days and then DC. 12/24: DC Ritalin due to ongoing insomnia  6. Skin/Wound Care: Monitor scalp abrasions which are healing well.  7. Fluids/Electrolytes/Nutrition: Monitor I/O. Intake reported to be reasonable  - 12/18: Mild hyponatremia 132; repeat in AM to trend . -12-19: 130 today, trend again in a.m. and if less will place on fluid restriction and remove trazodone. 12-20: NA 131, stable, resume trazodone.  Repeat Monday a.m. 12/23: NA stable 131  8.  HTN: BP trending up. Amlodipine and Maxide resumed 12/17    - stable on current medication regimen    07/13/2023    5:50 AM 07/13/2023    5:49 AM 07/12/2023    7:57 PM  Vitals with BMI  Weight 158 lbs 8 oz    BMI 24.11    Systolic  115 150  Diastolic  75 89  Pulse  90 85  Controlled 12/22-26  9. Seizure due to underlying trauma/SAH: Continue Depakote bid w/Klonopin  2 mg prn  for seizure lasting > 2 minutes.    - 12/18: VPA level mildly low, no seizures this admission, management per neurology  -12/24: Ongoing headache with no focal neurologic deficits.  Minimize pain regimen and limit exacerbating activities as above; significantly improved with use of sunglasses  10. Superficial vein thrombosis Left cephalic vein: Managed with local measures and pain/edema has resolved.    11. Reactive thrombocytosis: Continue to monitor. On Lovenox bid.     - 2/18 normalized/resolved  12. Anemia: Likely due to critical illness and stable around 9 for the past month --Recheck CBC in am--HgB 12.5; prior 9.5; recheck h/h in AM  13. Pre-diabetes: Hgb A1C- 6.2 last year and has been stable since 2014.  14. BPH: Urinary retention has resolved -->passed voiding trail on 12/4. Monitor I/O.    - continent  15. Remote H/o Polysubstance abuse: Has been sober/off drugs since 2015.   -Would not provide oxycodone at discharge  16. L3-L5 TVP Fx/ H/o Lumbar radiculopathy: Has been on gabapentin in the past (Dr. Maurice Small)   17. Resting tachycardia: Monitor for symptoms with increase in activity.    - HR WNL overnight; monitor  18. Liver cysts?: Needs follow up ultrasound in 2 months.   19. Dysphagia with D1 thin diet, however sounds gurgly and needs to clear throat almost constantly- might need to have swallow reassessed?  - 12/20: Per SLP primary barriers concentration, Ritalin initiated as above   - 12/23: Doing better with secretion management, remains on D1 diet, p.o. intakes adequate/100%.   20. Rhonchorous lung sounds.  Resolved  21.  C2 base fracture cont cervical orthosis per Dr. Wynetta Emery.  Outpatient follow-up with neurosurgery for C-spine clearance   LOS: 9 days A FACE TO FACE EVALUATION WAS PERFORMED  Angelina Sheriff 07/13/2023, 4:51 PM

## 2023-07-13 NOTE — Progress Notes (Signed)
Speech Language Pathology Daily Session Note  Patient Details  Name: [name] MRN: [id number] Date of Birth: 1956/04/21  Today's Date: 07/13/2023 SLP Individual Time: 0804-0900 SLP Individual Time Calculation (min): 56 min  Short Term Goals: Week 1: SLP Short Term Goal 1 (Week 1): STGs=LTGs d/t ELOS  Skilled Therapeutic Interventions: SLP conducted skilled therapy session targeting dysphagia management and cognitive retraining goals. Patient reports no difficulty with breakfast though notes that he doesn't like pureed consistencies. Patient engaged in trials of upgraded solid textures including graham cracker crumbled in applesauce (Dys2) and unmodified graham crackers (Dys3). SLP still notes piecemeal swallowing, though patient now only requiring supervision for consistent use of throat clear after the swallow strategy to increase safety. Patient remarks that he has utilized piecemeal swallowing his whole life, thus it is difficult for him to change it now. SLP and patient discussed that if piecemeal swallowing is his baseline, it may be safe for him to continue to do so as long as energy expenditure during meals is not too high. Patient verbalized understanding. SLP will plan for another Dys2 trial tray tomorrow at breakfast with SLP.   Cognitively, patient endorses that he is not ready to multitask and reports focus deficits at baseline. Patient indicates that thinking-wise, he would rather "be lazy" and rest rather than actively improving attention deficits. Likewise when presented with worksheet based activity, patient indicated he was a more "hands on" type of person. SLP retrieved more hands-on focus-based activity with patient benefiting from mod assist overall to complete accurately. Patient was left in lowered bed with call bell in reach and bed alarm set. SLP will continue to target goals per plan of care.        Pain Pain Assessment Pain Scale: 0-10 Pain Score: 4  Pain  Location: Shoulder Pain Descriptors / Indicators: Headache Pain Intervention(s): Medication (See eMAR)  Therapy/Group: Individual Therapy  Jeannie Done, M.A., CCC-SLP  Yetta Barre 07/13/2023, 8:24 AM

## 2023-07-13 NOTE — Progress Notes (Signed)
Occupational Therapy Session Note  Patient Details  Name: [name] MRN: [id number] Date of Birth: [birth year]  Today's Date: 07/13/2023 OT Individual Time: 0930-1015 OT Individual Time Calculation (min): 45 min    Skilled Therapeutic Interventions/Progress Updates:    Pt received supine with no c/o pain, agreeable to OT session requesting shower. He completed bed mobility with mod I to EOB. Ambulatory transfer into the bathroom with (S), requiring min cueing for safety when transferring into shower onto TTB. He completed full body bathing with (S) overall. He returned to EOB and dressed UB/LB, socks and shoes with (S). OT completed collar pad change and stoma dressing change while he was supine. Oral care in standing with distant (S). 125 ft of functional mobility to the therapy gym at (S) level. He completed 9 hole peg test and box and blocks for d/c re-assess. He returned to his room and was left sitting up in the recliner with chair alarm set and all needs met.   9 hole peg: 32 seconds on R and L  Box and blocks: R 29 L L 28  Therapy Documentation Precautions:  Precautions Precautions: Fall Precaution Comments: C-collar at all times Required Braces or Orthoses: Cervical Brace Cervical Brace: At all times, Hard collar Restrictions Weight Bearing Restrictions Per Provider Order: No   Therapy/Group: Individual Therapy  Crissie Reese 07/13/2023, 10:00 AM

## 2023-07-14 ENCOUNTER — Other Ambulatory Visit (HOSPITAL_COMMUNITY): Payer: Self-pay

## 2023-07-14 DIAGNOSIS — S069X9S Unspecified intracranial injury with loss of consciousness of unspecified duration, sequela: Secondary | ICD-10-CM | POA: Diagnosis not present

## 2023-07-14 DIAGNOSIS — I609 Nontraumatic subarachnoid hemorrhage, unspecified: Secondary | ICD-10-CM | POA: Diagnosis not present

## 2023-07-14 MED ORDER — AMLODIPINE BESYLATE 10 MG PO TABS
10.0000 mg | ORAL_TABLET | Freq: Every day | ORAL | 0 refills | Status: DC
  Filled 2023-07-14: qty 30, 30d supply, fill #0

## 2023-07-14 MED ORDER — MELATONIN 5 MG PO TABS: 5.0000 mg | ORAL_TABLET | Freq: Every evening | ORAL | Status: DC | PRN

## 2023-07-14 MED ORDER — ACETAMINOPHEN 325 MG PO TABS: 325.0000 mg | ORAL_TABLET | Freq: Four times a day (QID) | ORAL | Status: DC | PRN

## 2023-07-14 MED ORDER — DICLOFENAC SODIUM 1 % EX GEL
2.0000 g | Freq: Four times a day (QID) | CUTANEOUS | 0 refills | Status: DC
  Filled 2023-07-14: qty 300, 37d supply, fill #0

## 2023-07-14 MED ORDER — OXYCODONE HCL 5 MG PO TABS
5.0000 mg | ORAL_TABLET | Freq: Three times a day (TID) | ORAL | 0 refills | Status: DC | PRN
  Filled 2023-07-14: qty 21, 7d supply, fill #0

## 2023-07-14 MED ORDER — TRIAMTERENE-HCTZ 37.5-25 MG PO TABS
1.0000 | ORAL_TABLET | Freq: Every day | ORAL | 0 refills | Status: DC
  Filled 2023-07-14: qty 30, 30d supply, fill #0

## 2023-07-14 MED ORDER — TRAZODONE HCL 50 MG PO TABS
100.0000 mg | ORAL_TABLET | Freq: Every day | ORAL | Status: DC
Start: 2023-07-14 — End: 2023-07-15
  Administered 2023-07-14: 100 mg via ORAL
  Filled 2023-07-14: qty 2

## 2023-07-14 MED ORDER — ADULT MULTIVITAMIN W/MINERALS CH
1.0000 | ORAL_TABLET | Freq: Every day | ORAL | 0 refills | Status: DC
  Filled 2023-07-14: qty 30, 30d supply, fill #0

## 2023-07-14 MED ORDER — VALPROIC ACID 250 MG PO CAPS
250.0000 mg | ORAL_CAPSULE | Freq: Two times a day (BID) | ORAL | 0 refills | Status: DC
  Filled 2023-07-14: qty 60, 30d supply, fill #0

## 2023-07-14 MED ORDER — ACETAMINOPHEN 325 MG PO TABS
325.0000 mg | ORAL_TABLET | Freq: Four times a day (QID) | ORAL | 0 refills | Status: DC | PRN
  Filled 2023-07-14: qty 120, 15d supply, fill #0

## 2023-07-14 MED ORDER — TRAZODONE HCL 50 MG PO TABS
50.0000 mg | ORAL_TABLET | Freq: Every evening | ORAL | 0 refills | Status: DC | PRN
  Filled 2023-07-14: qty 30, 30d supply, fill #0

## 2023-07-14 MED ORDER — POLYETHYLENE GLYCOL 3350 17 GM/SCOOP PO POWD
17.0000 g | Freq: Every day | ORAL | 0 refills | Status: DC
  Filled 2023-07-14: qty 238, 14d supply, fill #0
  Filled 2023-08-08: qty 238, 14d supply, fill #1

## 2023-07-14 MED ORDER — TRAZODONE HCL 50 MG PO TABS
100.0000 mg | ORAL_TABLET | Freq: Every evening | ORAL | 0 refills | Status: DC | PRN
  Filled 2023-07-14: qty 30, 15d supply, fill #0

## 2023-07-14 NOTE — Progress Notes (Signed)
Occupational Therapy Session Note  Patient Details  Name: [name] MRN: [id number] Date of Birth: 01/14/1956  Today's Date: 07/14/2023 OT Individual Time: 1015-1100 OT Individual Time Calculation (min): 45 min    Short Term Goals: Week 1:  OT Short Term Goal 1 (Week 1): STG=LTG d/t ELOS   Skilled Therapeutic Interventions/Progress Updates:    1:1 Pt received sitting EOB and reported he already dressed himself today and declined formal bathing today. Focus on activity  tolerance/ endurance with functional mobility. Pt ambulated all around the hospital ; ambulated to the [facility] , taking elevator down to main entrance, walked to central portion and down the main flight of stairs (alternating the steps and sometimes stepping on the step with both feet), ambulated across the hospital back to the west side and up a flight of stairs using the rail on his right. Two the elevator up the other two flights and ambulated back into the main gym. After a seated rest break performed dynamic balance actvity with the agility ladder; weaving in and out of it, forwards and backwards. Pt with some difficulty with more complex foot work patterns. 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 Also discussed and practiced fall recovery on the floor. Able to get into down to the floor and back up to the mat table with close supervision.   Pt left in the room mod I .  Therapy Documentation Precautions:  Precautions Precautions: Fall Precaution Comments: C-collar at all times Required Braces or Orthoses: Cervical Brace Cervical Brace: At all times, Hard collar Restrictions Weight Bearing Restrictions Per Provider Order: No  Pain: Pain Assessment Pain Scale: 0-10 Pain Score: 0-No pain   Therapy/Group: Individual Therapy  Roney Mans [facility] 07/14/2023, 3:18 PM

## 2023-07-14 NOTE — Progress Notes (Signed)
Physical Therapy Discharge Summary  Patient Details  Name: [name] MRN: [id number] Date of Birth: 1955/11/14  Date of Discharge from PT service:July 14, 2023  Today's Date: 07/14/2023 PT Individual Time: 0903-0956 PT Individual Time Calculation (min): 53 min    Patient has met 9 of 9 long term goals due to improved activity tolerance, improved balance, improved postural control, increased strength, improved attention, improved awareness, and improved coordination.  Patient to discharge at an ambulatory level Supervision.   Patient's care partner is independent to provide the necessary cognitive assistance at discharge.  Reasons goals not met: NA  Recommendation:  Patient will benefit from ongoing skilled PT services in outpatient setting to continue to advance safe functional mobility, address ongoing impairments in high level balance, multitasking, endurance, and minimize fall risk.  Equipment: No equipment provided  Reasons for discharge: treatment goals met and discharge from hospital  Patient/family agrees with progress made and goals achieved: Yes  Skilled Therapeutic Interventions: Pt received supine in bed and agrees to therapy. No complaint of pain. Pt performs supine to sit independently. Pt stands and ambulates around room following cue for initiation. Pt able to toilet himself and perform ADLs at sink without assistance. Pt then ambulates x300' to ortho gym without AD, with cues for navigation. Pt completes ramp navigation and car transfer independently. Pt then ambulates to main gym and takes seated rest break. Pt completes x12 6" steps with Bilateral hand rails and cues for step sequencing. Pt then completes x15:00 on Nustep at workload of 6 with average steps per minute ~40. Pt completes for endurance training and reciprocal coordination, with PT providing cues for hand and foot placement and completing full available ROM. PT makes pt independent in room following  discussion with OT and RN. Pt left seated on bed with all needs within reach.   PT Discharge Precautions/Restrictions Precautions Precautions: Fall Precaution Comments: C-collar at all times Required Braces or Orthoses: Cervical Brace Cervical Brace: At all times;Hard collar Restrictions Weight Bearing Restrictions Per Provider Order: No Pain Interference Pain Interference Pain Effect on Sleep: 2. Occasionally Pain Interference with Therapy Activities: 2. Occasionally Pain Interference with Day-to-Day Activities: 2. Occasionally Vision/Perception  Vision - History Ability to See in Adequate Light: 0 Adequate Vision - Assessment Eye Alignment: Within Functional Limits Perception Perception: Within Functional Limits Praxis Praxis: WFL  Cognition Overall Cognitive Status: Impaired/Different from baseline Arousal/Alertness: Awake/alert Orientation Level: Oriented X4 Year: 2024 Month: December Day of Week: Correct Attention: Alternating Sustained Attention: Impaired Sustained Attention Impairment: Functional basic Selective Attention: Impaired Selective Attention Impairment: Functional basic;Verbal basic Alternating Attention: Impaired Alternating Attention Impairment: Functional basic Memory: Impaired Memory Impairment: Storage deficit;Retrieval deficit;Decreased recall of new information Awareness: Impaired Awareness Impairment: Anticipatory impairment Problem Solving: Impaired Problem Solving Impairment: Functional complex;Verbal complex Executive Function: Reasoning;Decision Making;Self Monitoring;Self Correcting Reasoning: Impaired Reasoning Impairment: Verbal basic;Functional basic Decision Making: Impaired Decision Making Impairment: Verbal basic;Functional basic Self Monitoring: Impaired Self Monitoring Impairment: Verbal basic;Functional basic Self Correcting: Impaired Self Correcting Impairment: Verbal basic;Functional basic Safety/Judgment: Appears  intact [place] Scales of Cognitive Functioning: Purposeful, Appropriate: Stand-by Assistance Sensation Sensation Light Touch: Appears Intact Proprioception: Appears Intact Stereognosis: Appears Intact Coordination Gross Motor Movements are Fluid and Coordinated: No Fine Motor Movements are Fluid and Coordinated: No Coordination and Movement Description: reduced coordination overall with generalized weakness Finger Nose Finger Test: Slow but overall WFL Motor  Motor Motor: Other (comment) Motor - Skilled Clinical Observations: generalized weakness with reduced balance  Mobility Bed Mobility Bed  Mobility: Supine to Sit;Sit to Supine Supine to Sit: Independent Sit to Supine: Independent Transfers Transfers: Sit to Stand;Stand to [employer] Transfers Sit to Stand: Independent Stand to Sit: Independent Stand Pivot Transfers: Independent Stand Pivot Transfer Details: Verbal cues for gait pattern;Verbal cues for precautions/safety Transfer (Assistive device): None Locomotion  Gait Ambulation: Yes Gait Assistance: Independent Gait Distance (Feet): 300 Feet Assistive device: None Gait Gait: Yes Gait Pattern: Within Functional Limits Stairs / Additional Locomotion Stairs: Yes Stairs Assistance: Moderate Assistance - Patient 50 - 74% Stair Management Technique: Two rails Number of Stairs: 12 Height of Stairs: 6 Ramp: Supervision/Verbal cueing Curb: Supervision/Verbal cueing Pick up small object from the floor assist level: Minimal Assistance - Patient > 75% Wheelchair Mobility Wheelchair Mobility: No  Trunk/Postural Assessment  Cervical Assessment Cervical Assessment: Within Functional Limits Thoracic Assessment Thoracic Assessment: Within Functional Limits Lumbar Assessment Lumbar Assessment: Within Functional Limits Postural Control Postural Control: Within Functional Limits  Balance Balance Balance Assessed: Yes Static Sitting Balance Static Sitting  - Balance Support: Feet supported Static Sitting - Level of Assistance: 7: Independent Dynamic Sitting Balance Dynamic Sitting - Balance Support: Feet supported Dynamic Sitting - Level of Assistance: 7: Independent Static Standing Balance Static Standing - Balance Support: During functional activity Static Standing - Level of Assistance: 7: Independent Dynamic Standing Balance Dynamic Standing - Balance Support: During functional activity Dynamic Standing - Level of Assistance: 7: Independent Extremity Assessment  RLE Assessment RLE Assessment: Exceptions to [facility] General Strength Comments: Grossly 4+/5 LLE Assessment LLE Assessment: Exceptions to [facility] General Strength Comments: Grossly 4+/5   Beau Fanny, PT, DPT 07/14/2023, 4:12 PM

## 2023-07-14 NOTE — Discharge Summary (Incomplete)
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 rehab 07/04/2023 for inpatient therapies to consist of PT, ST and OT at least three hours five days a week. Past admission physiatrist, therapy team and rehab RN have worked together to provide customized collaborative inpatient rehab.   Blood pressures were monitored on TID basis and has been stable. Ritalin was added briefly for activation and to help with attention.  Sleep wake disruption has been managed with use of trazodone. Follow up labs showed hyponatremia which has improved with fluid restriction and decrease in dose of trazodone. Diet was advanced to D2 and FR discontinued as Sodium level have improved to 134. Ensure supplements were added to help with malnutrition.    BERG balance score has improved from 27/56 to 46/56. He has made good gains during his stay and supervision is recommended for safety. He will continue to receive follow up outpatient PT, OT and ST at [facility] after discharge.     Rehab course: During patient's stay in rehab weekly team conferences were held to monitor patient's progress, set goals and discuss barriers to discharge. At admission, patient required  He  has had improvement in activity tolerance, balance, postural control as well as ability to compensate for deficits. He/She has had improvement in functional use RUE/LUE  and RLE/LLE as well as improvement in awareness       Disposition: Home   Diet:  Chopped foods  Special Instructions:  Discharge Instructions     Ambulatory referral to Occupational Therapy   Complete by: As directed    Eval and treat   Ambulatory referral to Physical Medicine Rehab   Complete by: As directed    Hospital follow up   Ambulatory referral to Physical Therapy   Complete by: As directed    Eval and treat   Ambulatory referral to Speech Therapy  Complete by: As directed    Eval and treat      Allergies as of 07/14/2023       Reactions   Ace Inhibitors Cough   Penicillins Hives, Rash        Medication List     STOP taking these medications    aspirin EC 81 MG tablet   diclofenac 75 MG EC tablet Commonly known as: VOLTAREN   docusate sodium 50 MG capsule Commonly known as: COLACE   Fish Oil 1000 MG Caps   gabapentin 100 MG capsule Commonly known as: Neurontin   Guaiatussin AC 100-10 MG/5ML syrup Generic drug: guaiFENesin-codeine   methocarbamol 500 MG tablet Commonly known as: ROBAXIN   pantoprazole 40 MG tablet Commonly known as: PROTONIX   rosuvastatin 20 MG tablet Commonly known as: CRESTOR   tadalafil 20 MG tablet Commonly known as: Cialis       TAKE these medications    acetaminophen 325 MG tablet Commonly known as: TYLENOL Take 1-2 tablets (325-650 mg total) by mouth every 6 (six) hours as needed for mild pain (pain score 1-3), headache or moderate pain (pain score 4-6).   amLODipine 10 MG tablet Commonly known as: NORVASC Take 1 tablet (10 mg total) by mouth daily.   diclofenac Sodium 1 % Gel Commonly known as: VOLTAREN Apply 2 g topically 4 (four) times daily. Right ankle   ipratropium 0.03 % nasal spray Commonly known as: ATROVENT Place 2 sprays into both nostrils every 12 (twelve) hours.   multivitamin with minerals Tabs tablet Take 1 tablet by mouth daily.   polyethylene glycol 17 g packet Commonly known as: MIRALAX / GLYCOLAX Take 17 g by mouth daily.   traZODone 50 MG  tablet Commonly known as: DESYREL Take 1 tablet (50 mg total) by mouth at bedtime as needed for sleep.   triamterene-hydrochlorothiazide 37.5-25 MG capsule Commonly known as: Dyazide Take 1 each (1 capsule total) by mouth daily.   valproic acid 250 MG capsule Commonly known as: DEPAKENE Take 1 capsule (250 mg total) by mouth 2 (two) times daily.   vitamin D3 50 MCG (2000 UT) Caps Take 1 capsule by mouth daily.        Follow-up Information     Nooruddin, Jason Fila, MD. Call.   Specialty: Internal Medicine Why: Monday for post hospital follow up Contact information: [address] [place] [address] [phone]         Angelina Sheriff, DO .   Specialty: Physical Medicine and Rehabilitation Contact information: [address] [place] [address] [phone]         Donalee Citrin, MD. Call.   Specialty: Neurosurgery Why: Monday for follow up appt/Neck injury Contact information: [address] [place] [address] [phone]         [facility] Follow up.   Why: office will call you with follow up appointment Contact information: [address]     [address] [place] [address] [phone]                Signed: Jacquelynn Cree 07/14/2023, 1:45 PM

## 2023-07-14 NOTE — Progress Notes (Signed)
Inpatient Rehabilitation Care Coordinator Discharge Note   Patient Details  Name: [name] MRN: [id number] Date of Birth: [birth year]   Discharge location: D/c to home  Length of Stay: 10 days  Discharge activity level: Supervision  Home/community participation: Limited  Patient response QM:VHQION Literacy - How often do you need to have someone help you when you read instructions, pamphlets, or other written material from your doctor or pharmacy?: Never  Patient response GE:XBMWUX Isolation - How often do you feel lonely or isolated from those around you?: Never  Services provided included: MD, RD, PT, OT, RN, Pharmacy, Neuropsych, SW, TR, CM, SLP  Financial Services:  Field seismologist Utilized: Private Insurance [facility] Medicare  Choices offered to/list presented to: patient and pt son [name]  Follow-up services arranged:  Patient/Family request agency HH/DME, Outpatient    Outpatient Servicies: [facility] for PT/OT/SLP      Patient response to transportation need: Is the patient able to respond to transportation needs?: Yes In the past 12 months, has lack of transportation kept you from medical appointments or from getting medications?: No In the past 12 months, has lack of transportation kept you from meetings, work, or from getting things needed for daily living?: No   Patient/Family verbalized understanding of follow-up arrangements:  Yes  Individual responsible for coordination of the follow-up plan: contact pt [phone] or pt son [name] [phone]  Confirmed correct DME delivered: Gretchen Short 07/14/2023    Comments (or additional information):fam edu completed  Summary of Stay    Date/Time Discharge Planning CSW  07/10/23 1456 Pt will d/c to home with support from pt son who will take FMLA, and PRN support from various family members. Fam edu completed on 12/23 with pt granddaughter and DIL. SW will confirm there are no barriers to  discharge. AAC       Ndrew Creason A Lula Olszewski

## 2023-07-14 NOTE — Progress Notes (Signed)
PROGRESS NOTE   Subjective/Complaints: No acute complaints.  No events overnight.   Patient states that sleep is much worse once nightly medications were adjusted, with some put back to how they were the night before.  Otherwise, no concerns, complaints.  Feeling prepared for discharge tomorrow.  ROS: + Headache -rare, improving. + Insomnia-ongoing. denies fevers, chills, N/V, abdominal pain, constipation, diarrhea, SOB, cough, chest pain, new weakness or paraesthesias.    Objective:   No results found.  Recent Labs    07/13/23 0545  WBC 4.2  HGB 12.2*  HCT 37.5*  PLT 259    Recent Labs    07/13/23 0545  NA 134*  K 3.7  CL 96*  CO2 28  GLUCOSE 104*  BUN 17  CREATININE 0.76  CALCIUM 9.0    Intake/Output Summary (Last 24 hours) at 07/14/2023 0914 Last data filed at 07/14/2023 0900 Gross per 24 hour  Intake 720 ml  Output --  Net 720 ml        Physical Exam: Vital Signs Blood pressure 112/79, pulse 86, temperature 97.9 F (36.6 C), resp. rate 19, height 5\' 8"  (1.727 m), weight 71.8 kg, SpO2 99%.  General: No acute distress.  Walking independently in room. HEENT: Cervical collar donned, fitting well.  Trach stoma site closed-covered in Mepilex. Heart: Regular rate and rhythm no rubs murmurs or extra sounds Lungs: Clear to auscultation, breathing unlabored, no rales or wheezes.   Abdomen: Positive bowel sounds, soft nontender to palpation, nondistended Extremities: No clubbing, cyanosis, or edema Skin: No evidence of breakdown, no evidence of rash  Neurologic:  Awake, alert, and oriented x 3.  Can follow all simple commands.  No apparent deficits. motor strength is 5/5 bilaterally in upper and lower extremities-unchanged 12/27 No apparent fine motor deficits or ataxia Musculoskeletal: Full range of motion in all 4 extremities. No joint swelling  Assessment/Plan: 1. Functional deficits which require 3+  hours per day of interdisciplinary therapy in a comprehensive inpatient rehab setting. Physiatrist is providing close team supervision and 24 hour management of active medical problems listed below. Physiatrist and rehab team continue to assess barriers to discharge/monitor patient progress toward functional and medical goals  Care Tool:  Bathing    Body parts bathed by patient: Right arm, Left arm, Chest, Abdomen, Front perineal area, Buttocks, Right upper leg, Left upper leg, Right lower leg, Left lower leg, Face         Bathing assist Assist Level: Supervision/Verbal cueing     Upper Body Dressing/Undressing Upper body dressing   What is the patient wearing?: Pull over shirt    Upper body assist Assist Level: Supervision/Verbal cueing    Lower Body Dressing/Undressing Lower body dressing      What is the patient wearing?: Underwear/pull up, Pants     Lower body assist Assist for lower body dressing: Supervision/Verbal cueing     Toileting Toileting    Toileting assist Assist for toileting: Supervision/Verbal cueing     Transfers Chair/bed transfer  Transfers assist     Chair/bed transfer assist level: Supervision/Verbal cueing     Locomotion Ambulation   Ambulation assist      Assist level: Minimal Assistance -  Patient > 75% Assistive device: No Device Max distance: 140'   Walk 10 feet activity   Assist     Assist level: Minimal Assistance - Patient > 75%     Walk 50 feet activity   Assist    Assist level: Minimal Assistance - Patient > 75%      Walk 150 feet activity   Assist Walk 150 feet activity did not occur: Safety/medical concerns (Due to fatigue)         Walk 10 feet on uneven surface  activity   Assist     Assist level: Minimal Assistance - Patient > 75%     Wheelchair     Assist Is the patient using a wheelchair?: Yes Type of Wheelchair: Manual    Wheelchair assist level: Dependent - Patient 0% Max  wheelchair distance: 150'    Wheelchair 50 feet with 2 turns activity    Assist        Assist Level: Dependent - Patient 0%   Wheelchair 150 feet activity     Assist      Assist Level: Dependent - Patient 0%   Blood pressure 112/79, pulse 86, temperature 97.9 F (36.6 C), resp. rate 19, height 5\' 8"  (1.727 m), weight 71.8 kg, SpO2 99%.  1. Functional deficits secondary to TBI- [place] with SAH             -patient may  shower             -ELOS/Goals: 10-14 days- supervision-goal date 12-28  - stable to continue CIR 12/20: Family concerned regarding discharge home on dysphagia diet;  P.o. intakes 80 to 90%, so does appear to be getting enough nutrition. 12/24: Slower processing but good carryover and excellent memory. Headache seems light sensitivity related and sunglasses have been helping. SLP trialling D2 textures today, Mod-Max A for swallowing strategies.  The patient is medically ready for discharge 12-28 to home and will need follow-up with [facility] PM[facility]R. In addition, they will need to follow up with their PCP, Neurosurgery.    2.  Antithrombotics: -DVT/anticoagulation:  Pharmaceutical: Lovenox             -antiplatelet therapy: N/A  3. New onset headaches/Pain Management: Oxycodone and tylenol prn are effective when administered.              --Question addition of gabapentin for HA/Pain. Topamax added today for daily HA's. . 12/18: No headaches endorsed overnight, continue current regimen 12/19: Headaches increasingly intermittent, well-controlled on current management.  May move Topamax to as needed tomorrow if continues to improve.  Scheduled Tylenol 1000 mg 3 times daily, and reduce oxycodone to 5 mg every 4 hours as needed. 12-20: Headaches much improved, DC Topamax 12-23: Moved Tylenol to 325 to 650 mg every 6 hours as needed for headache and mild to moderate pain.  Decrease oxycodone to 5 mg every 6 hours as needed for severe pain. Symptoms well-controlled;  no complaints 12-25 to 12-27 Using oxycodone 5 mg approximately twice daily; will give temporary prescription at discharge and not continue further after that.    4. Mood/Behavior/Sleep: LCSW to follow for evaluation and support.              -antipsychotic agents:  N/A             --valium being weaned off--has been on 2 mg bid since 12/14. Continue Trazodone 50 mg  12/18: Reduce Valium to 1 mg twice daily tomorrow a.m. for 3 days,  then DC  12-20: Scheduled trazodone 50 mg resumed due to poor sleep  12/23: sleep endorses poor, however 8 continuous hours per sleep log.  12/24: No sleep overnight.  Increase trazodone to 100 mg nightly, add back as needed melatonin  12/25: Mild improvement with increased trazodone; change as needed melatonin to diphenhydramine.  12/26: No improvement.  Move trazodone to 50 mg as needed, DC diphenhydramine--> resume prior trazodone 100 mg nightly; discharge medications adjusted accordingly.  5. Neuropsych/cognition: This patient is not fully capable of making decisions on his own behalf.  -Tele sitter for safety, continue today  12-19: No impulsive behaviors per staff or family; will monitor 1 additional day, then if remains stable will DC tele sitter  12/20: Add Ritalin 5 mg twice daily for concentration and arousal.  Maintain telemetry sitter 1 to 2 days per therapies, then reassess for DC - Arousal no longer an issue will reduce ritalin to qam 12/22 - 12/23: Doing better with concentration, agree with Ritalin every morning for 2 to 3 days and then DC. 12/24: DC Ritalin due to ongoing insomnia  6. Skin/Wound Care: Monitor scalp abrasions which are healing well.  7. Fluids/Electrolytes/Nutrition: Monitor I/O. Intake reported to be reasonable  - 12/18: Mild hyponatremia 132; repeat in AM to trend . -12-19: 130 today, trend again in a.m. and if less will place on fluid restriction and remove trazodone. 12-20: NA 131, stable, resume trazodone.  Repeat Monday  a.m. 12/23: NA stable 131  8.  HTN: BP trending up. Amlodipine and Maxide resumed 12/17    - stable on current medication regimen 12-27    07/14/2023    5:48 AM 07/14/2023    5:47 AM 07/13/2023    8:18 PM  Vitals with BMI  Weight 158 lbs 5 oz    BMI 24.07    Systolic  112 147  Diastolic  79 79  Pulse  86 83  Controlled 12/22-26  9. 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    - 12/18: VPA level mildly low, no seizures this admission, management per neurology  -12/24: Ongoing headache with no focal neurologic deficits.  Minimize pain regimen and limit exacerbating activities as above; significantly improved with use of sunglasses  10. Superficial vein thrombosis Left cephalic vein: Managed with local measures and pain/edema has resolved.    11. Reactive thrombocytosis: Continue to monitor. On Lovenox bid.     - 2/18 normalized/resolved  12. Anemia: Likely due to critical illness and stable around 9 for the past month --Recheck CBC in am--HgB 12.5; prior 9.5; recheck h/h in AM  13. Pre-diabetes: Hgb A1C- 6.2 last year and has been stable since 2014.  14. BPH: Urinary retention has resolved -->passed voiding trail on 12/4. Monitor I/O.    - continent  15. Remote H/o Polysubstance abuse: Has been sober/off drugs since 2015.   16. L3-L5 TVP Fx/ H/o Lumbar radiculopathy: Has been on gabapentin in the past (Dr. Maurice Small)   17. Resting tachycardia: Monitor for symptoms with increase in activity.    - HR WNL overnight; monitor  18. Liver cysts?: Needs follow up ultrasound in 2 months.   19. Dysphagia with D1 thin diet, however sounds gurgly and needs to clear throat almost constantly- might need to have swallow reassessed?  - 12/20: Per SLP primary barriers concentration, Ritalin initiated as above   - 12/23: Doing better with secretion management, remains on D1 diet, p.o.  intakes adequate/100%.   20.  Rhonchorous lung sounds.  Resolved  21.  C2 base fracture cont cervical orthosis per Dr. Wynetta Emery.  Outpatient follow-up with neurosurgery for C-spine clearance   LOS: 10 days A FACE TO FACE EVALUATION WAS PERFORMED  Angelina Sheriff 07/14/2023, 9:14 AM

## 2023-07-14 NOTE — Plan of Care (Signed)
  Problem: RH Problem Solving Goal: LTG Patient will demonstrate problem solving for (SLP) Description: LTG:  Patient will demonstrate problem solving for basic/complex daily situations with cues  (SLP) Outcome: Completed/Met Flowsheets (Taken 07/14/2023 1406) LTG: Patient will demonstrate problem solving for (SLP): Basic daily situations LTG Patient will demonstrate problem solving for: Minimal Assistance - Patient > 75%   Problem: RH Attention Goal: LTG Patient will demonstrate this level of attention during functional activites (SLP) Description: LTG:  Patient will will demonstrate this level of attention during functional activites (SLP) Outcome: Completed/Met Flowsheets Taken 07/14/2023 1406 by Renaee Munda, CCC-SLP Patient will demonstrate during cognitive/linguistic activities the attention type of: Sustained Patient will demonstrate this level of attention during cognitive/linguistic activities in: Controlled LTG: Patient will demonstrate this level of attention during cognitive/linguistic activities with assistance of (SLP): Minimal Assistance - Patient > 75% Taken 07/05/2023 1235 by Jeannie Done A, CCC-SLP Number of minutes patient will demonstrate attention during cognitive/linguistic activities: 15   Problem: RH Awareness Goal: LTG: Patient will demonstrate awareness during functional activites type of (SLP) Description: LTG: Patient will demonstrate awareness during functional activites type of (SLP) Outcome: Completed/Met Flowsheets (Taken 07/14/2023 1406) Patient will demonstrate during cognitive/linguistic activities awareness type of: Intellectual LTG: Patient will demonstrate awareness during cognitive/linguistic activities with assistance of (SLP): Supervision

## 2023-07-14 NOTE — Progress Notes (Signed)
Inpatient Rehabilitation Discharge Medication Review by a Pharmacist  A complete drug regimen review was completed for this patient to identify any potential clinically significant medication issues.  High Risk Drug Classes Is patient taking? Indication by Medication  Antipsychotic No   Anticoagulant No   Antibiotic No   Opioid No   Antiplatelet No   Hypoglycemics/insulin No   Vasoactive Medication Yes Triamterene/hydrochlorothiazide, amlodipine - HTN  Chemotherapy No   Other Yes Voltaren gel - pain Trazodone - prn sleep Valproic acid - seizure ppx     Type of Medication Issue Identified Description of Issue Recommendation(s)  Drug Interaction(s) (clinically significant)     Duplicate Therapy     Allergy     No Medication Administration End Date     Incorrect Dose     Additional Drug Therapy Needed     Significant med changes from prior encounter (inform family/care partners about these prior to discharge).    Other       Clinically significant medication issues were identified that warrant physician communication and completion of prescribed/recommended actions by midnight of the next day:  No  Name of provider notified for urgent issues identified:   Provider Method of Notification:   Pharmacist comments:   Time spent performing this drug regimen review (minutes):  15  Rexford Maus, PharmD, BCPS 07/14/2023 2:12 PM

## 2023-07-14 NOTE — Progress Notes (Signed)
Speech Language Pathology Daily Session Note  Patient Details  Name: [name] MRN: [id number] Date of Birth: 20-Jan-1956  Today's Date: 07/14/2023 SLP Individual Time: 0102-0149 SLP Individual Time Calculation (min): 47 min  Short Term Goals: Week 1: SLP Short Term Goal 1 (Week 1): STGs=LTGs d/t ELOS  Skilled Therapeutic Interventions:  Patient was  seen in PM to address cognitive re-training. Pt easily alerted upon SLP arrival and sat on EOB for session. Pt continued to report distance from cognitive baseline. SLP challenged pt in sequencing of common tasks including cooking, morning routine, etc. Pt sequenced 5-6 steps with 100% acc indep. SLP further challenged pt in recall of novel information. Given information presented verbally and distracted 5-7 minute delay, he recalled information with 100% acc mod I. Education completed to pt on reducing distractions, developing routines, and energy conservation in relation to completion of hisher level executive function task. Pt re taught information with 10% acc mod I. At conclusion of session, pt left in his room.            Pain Pain Assessment Pain Scale: 0-10 Pain Score: 0-No pain  Therapy/Group: Individual Therapy  Renaee Munda 07/14/2023, 1:56 PM

## 2023-07-14 NOTE — Progress Notes (Signed)
Speech Language Pathology Discharge Summary  Patient Details  Name: [name] MRN: [id number] Date of Birth: 10/13/1955  Date of Discharge from SLP service:July 14, 2023  Today's Date: 07/14/2023 SLP Individual Time: 6962-9528 SLP Individual Time Calculation (min): 41 min  Skilled Therapeutic Interventions:  SLP conducted skilled therapy session targeting dysphagia management and cognitive retraining goals. Assessed patient's tolerance of upgraded trial Dys2 tray with thin liquids. Patient continues to endorse that bizarre oral swallowing behaviors are baseline, thus SLP utilized hands-off approach to assess tolerance and to determine safety with potential diet upgrade. Patient independently utilized intermittent throat clear strategy between bites and though he demonstrated some questionable oral holding vs prolonged piecemeal swallowing with last bite of meal after indicating he was done,  he independently continued to clear this residue in piecemeal manner independently until oral cavity was clear. No clinical signs or symptoms of aspiration were observed, focus to bolus has improved, and rate and intake were adequate. Despite strange presentation, given the above positive factors, recommend continuation of Dys2/thin liquid diet with full supervision. Of note, no abnormal oral swallowing behaviors have been reported by nursing while patient takes pills whole.   Cognitively, patient has improved drastically from first evaluation, now sustaining attention to conversation and to task for 15 minutes with supervision-min assist overall and recalling daily events with 100% accuracy and supervision. 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 Discussed the importance of taking things slowly  and monitoring how his body feels as he begins to incorporate preferred tasks back into everyday life. Patient was left in lowered bed with call bell in reach and bed alarm set. SLP will continue to target goals per plan of care.    Patient has met 5 of 5 long term goals.  Patient to discharge at [facility] level.  Reasons goals not met: n/a   Clinical Impression/Discharge Summary: Patient has made excellent progress during CIR admission, meeting 5/5 long term goals set for duration. Patient is at an overall supervision level for tolerance of Dys2/thin liquid diet and supervision-min assist for completion of basic cognitive tasks. Patient would benefit from continued SLP services at next venue of care to target lingering cognitive and swallowing deficits. Patient and family education complete. Patient is appropriate for discharge from SLP services given pending hospital discharge.   Care Partner:  Caregiver Able to Provide Assistance: Yes  Type of Caregiver Assistance: Cognitive  Recommendation:  Outpatient SLP  Rationale for SLP Follow Up: Maximize cognitive function and independence   Equipment: n/a   Reasons for discharge: Discharged from hospital   Patient/Family Agrees with Progress Made and Goals Achieved: Yes   Jeannie Done, M.A., CCC-SLP   Yetta Barre 07/14/2023, 8:43 AM

## 2023-07-15 ENCOUNTER — Other Ambulatory Visit (HOSPITAL_COMMUNITY): Payer: Self-pay

## 2023-07-15 DIAGNOSIS — I10 Essential (primary) hypertension: Secondary | ICD-10-CM

## 2023-07-15 DIAGNOSIS — S069XAS Unspecified intracranial injury with loss of consciousness status unknown, sequela: Secondary | ICD-10-CM

## 2023-07-15 NOTE — Progress Notes (Signed)
PROGRESS NOTE   Subjective/Complaints:  Pt doing well! Ready to go home. Slept poorly but ready to sleep at home. Pain manageable. LBM yesterda (12/26 per documentation). Urinating fine. Denies any other complaints or concerns today. Meds reviewed.    ROS: + Headache -rare, improving. + Insomnia-ongoing. denies fevers, chills, N/V, abdominal pain, constipation, diarrhea, SOB, cough, chest pain, new weakness or paraesthesias.    Objective:   No results found.  Recent Labs    07/13/23 0545  WBC 4.2  HGB 12.2*  HCT 37.5*  PLT 259    Recent Labs    07/13/23 0545  NA 134*  K 3.7  CL 96*  CO2 28  GLUCOSE 104*  BUN 17  CREATININE 0.76  CALCIUM 9.0    Intake/Output Summary (Last 24 hours) at 07/15/2023 0943 Last data filed at 07/15/2023 6962 Gross per 24 hour  Intake 1440 ml  Output --  Net 1440 ml        Physical Exam: Vital Signs Blood pressure 127/80, pulse 96, temperature 97.6 F (36.4 C), temperature source Oral, resp. rate 18, height 5\' 8"  (1.727 m), weight 71.8 kg, SpO2 98%.  General: No acute distress.  Inclined in bed.  HEENT: Cervical collar donned, fitting well.  Trach stoma site closed with small scab, covered with bandaid. Heart: Regular rate and rhythm no rubs murmurs or extra sounds Lungs: Clear to auscultation, breathing unlabored, no rales or wheezes.   Abdomen: Positive bowel sounds, soft nontender to palpation, nondistended Extremities: No clubbing, cyanosis, or edema Skin: No evidence of breakdown, no evidence of rash  PRIOR EXAMS: Neurologic:  Awake, alert, and oriented x 3.  Can follow all simple commands.  No apparent deficits. motor strength is 5/5 bilaterally in upper and lower extremities-unchanged 12/27 No apparent fine motor deficits or ataxia Musculoskeletal: Full range of motion in all 4 extremities. No joint swelling  Assessment/Plan: 1. Functional deficits which require  3+ hours per day of interdisciplinary therapy in a comprehensive inpatient rehab setting. Physiatrist is providing close team supervision and 24 hour management of active medical problems listed below. Physiatrist and rehab team continue to assess barriers to discharge/monitor patient progress toward functional and medical goals  Care Tool:  Bathing    Body parts bathed by patient: Right arm, Left arm, Chest, Abdomen, Front perineal area, Buttocks, Right upper leg, Left upper leg, Right lower leg, Left lower leg, Face         Bathing assist Assist Level: Supervision/Verbal cueing     Upper Body Dressing/Undressing Upper body dressing   What is the patient wearing?: Pull over shirt    Upper body assist Assist Level: Supervision/Verbal cueing    Lower Body Dressing/Undressing Lower body dressing      What is the patient wearing?: Underwear/pull up, Pants     Lower body assist Assist for lower body dressing: Supervision/Verbal cueing     Toileting Toileting    Toileting assist Assist for toileting: Supervision/Verbal cueing     Transfers Chair/bed transfer  Transfers assist     Chair/bed transfer assist level: Supervision/Verbal cueing     Locomotion Ambulation   Ambulation assist      Assist level:  Minimal Assistance - Patient > 75% Assistive device: No Device Max distance: 140'   Walk 10 feet activity   Assist     Assist level: Minimal Assistance - Patient > 75%     Walk 50 feet activity   Assist    Assist level: Minimal Assistance - Patient > 75%      Walk 150 feet activity   Assist Walk 150 feet activity did not occur: Safety/medical concerns (Due to fatigue)         Walk 10 feet on uneven surface  activity   Assist     Assist level: Minimal Assistance - Patient > 75%     Wheelchair     Assist Is the patient using a wheelchair?: Yes Type of Wheelchair: Manual    Wheelchair assist level: Dependent - Patient  0% Max wheelchair distance: 150'    Wheelchair 50 feet with 2 turns activity    Assist        Assist Level: Dependent - Patient 0%   Wheelchair 150 feet activity     Assist      Assist Level: Dependent - Patient 0%   Blood pressure 127/80, pulse 96, temperature 97.6 F (36.4 C), temperature source Oral, resp. rate 18, height 5\' 8"  (1.727 m), weight 71.8 kg, SpO2 98%.  1. Functional deficits secondary to TBI- [place] with SAH             -patient may  shower             -ELOS/Goals: 10-14 days- supervision-goal date 12-28  - stable to continue CIR 12/20: Family concerned regarding discharge home on dysphagia diet;  P.o. intakes 80 to 90%, so does appear to be getting enough nutrition. 12/24: Slower processing but good carryover and excellent memory. Headache seems light sensitivity related and sunglasses have been helping. SLP trialling D2 textures today, Mod-Max A for swallowing strategies.  The patient is medically ready for discharge 12-28 to home and will need follow-up with [facility] PM&R. In addition, they will need to follow up with their PCP, Neurosurgery.  -07/15/23 d/c today, meds reviewed, pt verbalizes understanding. Handicap placard form given to family.   2.  Antithrombotics: -DVT/anticoagulation:  Pharmaceutical: Lovenox             -antiplatelet therapy: N/A  3. New onset headaches/Pain Management: Oxycodone and tylenol prn are effective when administered.              --Question addition of gabapentin for HA/Pain. Topamax added today for daily HA's. . 12/18: No headaches endorsed overnight, continue current regimen 12/19: Headaches increasingly intermittent, well-controlled on current management.  May move Topamax to as needed tomorrow if continues to improve.  Scheduled Tylenol 1000 mg 3 times daily, and reduce oxycodone to 5 mg every 4 hours as needed. 12-20: Headaches much improved, DC Topamax 12-23: Moved Tylenol to 325 to 650 mg every 6 hours as needed  for headache and mild to moderate pain.  Decrease oxycodone to 5 mg every 6 hours as needed for severe pain. Symptoms well-controlled; no complaints 12-25 to 12-27 Using oxycodone 5 mg approximately twice daily; will give temporary prescription at discharge and not continue further after that.    4. Mood/Behavior/Sleep: LCSW to follow for evaluation and support.              -antipsychotic agents:  N/A             --valium being weaned off--has been on 2 mg bid  since 12/14. Continue Trazodone 50 mg  12/18: Reduce Valium to 1 mg twice daily tomorrow a.m. for 3 days, then DC  12-20: Scheduled trazodone 50 mg resumed due to poor sleep  12/23: sleep endorses poor, however 8 continuous hours per sleep log.  12/24: No sleep overnight.  Increase trazodone to 100 mg nightly, add back as needed melatonin  12/25: Mild improvement with increased trazodone; change as needed melatonin to diphenhydramine.  12/26: No improvement.  Move trazodone to 50 mg as needed, DC diphenhydramine--> resume prior trazodone 100 mg nightly; discharge medications adjusted accordingly.  5. Neuropsych/cognition: This patient is not fully capable of making decisions on his own behalf.  -Tele sitter for safety, continue today  12-19: No impulsive behaviors per staff or family; will monitor 1 additional day, then if remains stable will DC tele sitter  12/20: Add Ritalin 5 mg twice daily for concentration and arousal.  Maintain telemetry sitter 1 to 2 days per therapies, then reassess for DC - Arousal no longer an issue will reduce ritalin to qam 12/22 - 12/23: Doing better with concentration, agree with Ritalin every morning for 2 to 3 days and then DC. 12/24: DC Ritalin due to ongoing insomnia  6. Skin/Wound Care: Monitor scalp abrasions which are healing well.  7. Fluids/Electrolytes/Nutrition: Monitor I/O. Intake reported to be reasonable  - 12/18: Mild hyponatremia 132; repeat in AM to trend . -12-19: 130 today, trend  again in a.m. and if less will place on fluid restriction and remove trazodone. 12-20: NA 131, stable, resume trazodone.  Repeat Monday a.m. 12/23: NA stable 131  8.  HTN: BP trending up. Amlodipine and Maxide resumed 12/17    - stable on current medication regimen 12-27    07/15/2023    8:15 AM 07/15/2023    5:20 AM 07/14/2023    9:46 PM  Vitals with BMI  Systolic 127 111 161  Diastolic 80 70 71  Pulse 96 84 65  Controlled 12/22-28, cont home meds  9. 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    - 12/18: VPA level mildly low, no seizures this admission, management per neurology  -12/24: Ongoing headache with no focal neurologic deficits.  Minimize pain regimen and limit exacerbating activities as above; significantly improved with use of sunglasses  10. Superficial vein thrombosis Left cephalic vein: Managed with local measures and pain/edema has resolved.    11. Reactive thrombocytosis: Continue to monitor. On Lovenox bid.     - 2/18 normalized/resolved  12. Anemia: Likely due to critical illness and stable around 9 for the past month --Recheck CBC in am--HgB 12.5; prior 9.5; recheck h/h in AM  13. Pre-diabetes: Hgb A1C- 6.2 last year and has been stable since 2014.  14. BPH: Urinary retention has resolved -->passed voiding trail on 12/4. Monitor I/O.    - continent  15. Remote H/o Polysubstance abuse: Has been sober/off drugs since 2015.   16. L3-L5 TVP Fx/ H/o Lumbar radiculopathy: Has been on gabapentin in the past (Dr. Maurice Small)   17. Resting tachycardia: Monitor for symptoms with increase in activity.    - HR WNL overnight; monitor  -07/15/23 HR stable, monitor outpatient  18. Liver cysts?: Needs follow up ultrasound in 2 months.   19. Dysphagia with D1 thin diet, however sounds gurgly and needs to clear throat almost constantly- might need to have swallow reassessed?  - 12/20: Per SLP primary  barriers concentration, Ritalin  initiated as above   - 12/23: Doing better with secretion management, remains on D1 diet, p.o. intakes adequate/100%.   20. Rhonchorous lung sounds.  Resolved  21.  C2 base fracture cont cervical orthosis per Dr. Wynetta Emery.  Outpatient follow-up with neurosurgery for C-spine clearance   LOS: 11 days A FACE TO [facility] EVALUATION WAS PERFORMED  [address] 07/15/2023, 9:43 AM

## 2023-07-20 ENCOUNTER — Ambulatory Visit: Payer: Medicare Other | Attending: Physician Assistant | Admitting: Physical Therapy

## 2023-07-20 ENCOUNTER — Other Ambulatory Visit: Payer: Self-pay

## 2023-07-20 ENCOUNTER — Encounter: Payer: Self-pay | Admitting: Physical Therapy

## 2023-07-20 ENCOUNTER — Ambulatory Visit: Payer: Medicare Other | Admitting: Occupational Therapy

## 2023-07-20 VITALS — BP 147/84 | HR 92

## 2023-07-20 DIAGNOSIS — M6281 Muscle weakness (generalized): Secondary | ICD-10-CM

## 2023-07-20 DIAGNOSIS — M5459 Other low back pain: Secondary | ICD-10-CM | POA: Diagnosis not present

## 2023-07-20 DIAGNOSIS — S069X0S Unspecified intracranial injury without loss of consciousness, sequela: Secondary | ICD-10-CM | POA: Insufficient documentation

## 2023-07-20 DIAGNOSIS — R208 Other disturbances of skin sensation: Secondary | ICD-10-CM | POA: Diagnosis not present

## 2023-07-20 DIAGNOSIS — R29898 Other symptoms and signs involving the musculoskeletal system: Secondary | ICD-10-CM | POA: Insufficient documentation

## 2023-07-20 DIAGNOSIS — R278 Other lack of coordination: Secondary | ICD-10-CM | POA: Diagnosis not present

## 2023-07-20 DIAGNOSIS — M25579 Pain in unspecified ankle and joints of unspecified foot: Secondary | ICD-10-CM | POA: Diagnosis not present

## 2023-07-20 DIAGNOSIS — R29818 Other symptoms and signs involving the nervous system: Secondary | ICD-10-CM | POA: Diagnosis not present

## 2023-07-20 DIAGNOSIS — R2681 Unsteadiness on feet: Secondary | ICD-10-CM | POA: Diagnosis not present

## 2023-07-20 DIAGNOSIS — M542 Cervicalgia: Secondary | ICD-10-CM | POA: Insufficient documentation

## 2023-07-20 DIAGNOSIS — R2689 Other abnormalities of gait and mobility: Secondary | ICD-10-CM | POA: Diagnosis not present

## 2023-07-20 DIAGNOSIS — R41841 Cognitive communication deficit: Secondary | ICD-10-CM | POA: Insufficient documentation
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
## 2023-07-21 ENCOUNTER — Telehealth: Payer: Self-pay
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
## 2023-07-21 NOTE — Telephone Encounter (Signed)
 Pt's son states the hole where the trach was placed has a little blood discharge and feels sore. Requesting a call back, wanting to know what should he do.

## 2023-07-24 ENCOUNTER — Other Ambulatory Visit (HOSPITAL_COMMUNITY): Payer: Self-pay | Admitting: Surgery

## 2023-07-24 ENCOUNTER — Encounter (HOSPITAL_COMMUNITY): Payer: Self-pay | Admitting: Surgery

## 2023-07-24 DIAGNOSIS — Z9889 Other specified postprocedural states: Secondary | ICD-10-CM

## 2023-07-25 ENCOUNTER — Ambulatory Visit (HOSPITAL_COMMUNITY)
Admission: RE | Admit: 2023-07-25 | Discharge: 2023-07-25 | Disposition: A | Discharge status: Home / Self Care | Payer: Medicare Other | Source: Ambulatory Visit | Attending: Surgery | Admitting: Surgery

## 2023-07-25 ENCOUNTER — Ambulatory Visit: Payer: Medicare Other | Admitting: Occupational Therapy

## 2023-07-25 DIAGNOSIS — Z9889 Other specified postprocedural states: Secondary | ICD-10-CM | POA: Diagnosis not present

## 2023-07-25 DIAGNOSIS — R278 Other lack of coordination: Secondary | ICD-10-CM

## 2023-07-25 DIAGNOSIS — I700 Atherosclerosis of aorta: Secondary | ICD-10-CM | POA: Diagnosis not present

## 2023-07-25 DIAGNOSIS — M6281 Muscle weakness (generalized): Secondary | ICD-10-CM

## 2023-07-25 DIAGNOSIS — I6529 Occlusion and stenosis of unspecified carotid artery: Secondary | ICD-10-CM | POA: Diagnosis not present

## 2023-07-25 DIAGNOSIS — R208 Other disturbances of skin sensation: Secondary | ICD-10-CM

## 2023-07-25 DIAGNOSIS — R29818 Other symptoms and signs involving the nervous system: Secondary | ICD-10-CM

## 2023-07-25 DIAGNOSIS — J341 Cyst and mucocele of nose and nasal sinus: Secondary | ICD-10-CM | POA: Diagnosis not present

## 2023-07-25 DIAGNOSIS — S12101D Unspecified nondisplaced fracture of second cervical vertebra, subsequent encounter for fracture with routine healing: Secondary | ICD-10-CM | POA: Diagnosis not present

## 2023-07-25 MED ORDER — IOHEXOL 300 MG/ML  SOLN
75.0000 mL | Freq: Once | INTRAMUSCULAR | Status: AC | PRN
  Administered 2023-07-25: 75 mL via INTRAVENOUS

## 2023-07-25 NOTE — Patient Instructions (Signed)
 Access Code: [email or web address] URL: [email or web address] Date: 07/25/2023 Prepared by: Clarita Pride  Exercises - Putty Squeezes  - 1-2 x daily - 10 reps - Rolling Putty on Table  - 1-2 x daily - 10 reps - Finger Pinch and Pull with Putty  - 1-2 x daily - 10 reps - 3-Point Pinch with Putty  - 1-2 x daily - 10 reps - Tip PUSH with Putty  - 1-2 x daily - 10 reps - Key Pinch with Putty  - 1-2 x daily - 10 reps - Finger Extension with Putty  - 1-2 x daily - 10 reps - Finger Adduction with Putty  - 1-2 x daily - 10 reps - Removing Marbles from Putty  - 1-2 x daily - 10 reps
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
## 2023-07-26 ENCOUNTER — Ambulatory Visit (HOSPITAL_COMMUNITY): Payer: Medicare Other

## 2023-07-26 ENCOUNTER — Encounter: Payer: Self-pay | Admitting: Internal Medicine

## 2023-07-26 ENCOUNTER — Ambulatory Visit (INDEPENDENT_AMBULATORY_CARE_PROVIDER_SITE_OTHER): Payer: Medicare Other | Admitting: Internal Medicine

## 2023-07-26 VITALS — BP 125/93 | HR 82 | Temp 98.1°F | Ht 68.0 in | Wt 148.2 lb

## 2023-07-26 DIAGNOSIS — Z430 Encounter for attention to tracheostomy: Secondary | ICD-10-CM

## 2023-07-26 DIAGNOSIS — S0990XD Unspecified injury of head, subsequent encounter: Secondary | ICD-10-CM | POA: Diagnosis not present

## 2023-07-26 DIAGNOSIS — E871 Hypo-osmolality and hyponatremia: Secondary | ICD-10-CM | POA: Diagnosis not present

## 2023-07-26 DIAGNOSIS — E876 Hypokalemia: Secondary | ICD-10-CM | POA: Diagnosis not present

## 2023-07-26 DIAGNOSIS — G4700 Insomnia, unspecified: Secondary | ICD-10-CM | POA: Diagnosis not present

## 2023-07-26 DIAGNOSIS — S12100A Unspecified displaced fracture of second cervical vertebra, initial encounter for closed fracture: Secondary | ICD-10-CM

## 2023-07-26 DIAGNOSIS — E7849 Other hyperlipidemia: Secondary | ICD-10-CM

## 2023-07-26 DIAGNOSIS — R569 Unspecified convulsions: Secondary | ICD-10-CM | POA: Diagnosis not present

## 2023-07-26 DIAGNOSIS — S0990XS Unspecified injury of head, sequela: Secondary | ICD-10-CM

## 2023-07-26 DIAGNOSIS — D649 Anemia, unspecified: Secondary | ICD-10-CM | POA: Diagnosis not present

## 2023-07-26 DIAGNOSIS — K7689 Other specified diseases of liver: Secondary | ICD-10-CM | POA: Diagnosis not present

## 2023-07-26 DIAGNOSIS — D508 Other iron deficiency anemias: Secondary | ICD-10-CM

## 2023-07-26 DIAGNOSIS — Z930 Tracheostomy status: Secondary | ICD-10-CM | POA: Diagnosis not present

## 2023-07-26 DIAGNOSIS — E785 Hyperlipidemia, unspecified: Secondary | ICD-10-CM

## 2023-07-26 DIAGNOSIS — J9500 Unspecified tracheostomy complication: Secondary | ICD-10-CM | POA: Insufficient documentation

## 2023-07-26 MED ORDER — GABAPENTIN 300 MG PO CAPS: 300.0000 mg | ORAL_CAPSULE | Freq: Three times a day (TID) | ORAL | 2 refills | Status: DC
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
## 2023-07-26 NOTE — Assessment & Plan Note (Signed)
>>  ASSESSMENT AND PLAN FOR DYSLIPIDEMIA WRITTEN ON 07/26/2023  6:17 PM BY FRANCELLA ROGUE, MD  Lipid profile today

## 2023-07-26 NOTE — Assessment & Plan Note (Signed)
 Patient recently had some bleeding from his tracheostomy site. He had a CT performed which showed no acute abnormality and no significant changes from previous. Today the site looks clean and dry with no bleeding or drainage.  - Referral to gen surg for tracheostomy site care

## 2023-07-26 NOTE — Assessment & Plan Note (Signed)
 CBC today for follow up of acute blood loss anemia
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
## 2023-07-26 NOTE — Assessment & Plan Note (Signed)
 Patient was hyponatremic during recent admission, which was improved by time of discharge. Repeat BMP today.

## 2023-07-26 NOTE — Assessment & Plan Note (Signed)
 Incidental finding on CT of a couple enhancing lesions within the right hepatic lobe that could represent flash filling hemangiomas with recommendation for RUQ ultrasound in 3 months. Study ordered.

## 2023-07-26 NOTE — Assessment & Plan Note (Signed)
Lipid profile today 

## 2023-07-26 NOTE — Assessment & Plan Note (Signed)
 Patient has had difficulty sleeping since being admitted to the hospital, most likely related to his pain. Trazodone  has not been helpful. Gabapentin  300mg  TID started for dual purpose of neuropathic pain and insomnia. Recommending trialing a single dose at bedtime, then increasing if it is helpful for his pain.  - Follow up in 4-6 weeks to adjust as necessary

## 2023-07-26 NOTE — Patient Instructions (Addendum)
 [name],   It was a pleasure meeting you today.   For your pain, I am prescribing a medication called gabapentin . You can take this medication up to three times a day. It does cause drowsiness, so I am recommending starting it at night.   I have put in referral to neurosurgery and general surgery for your C collar and your tracheostomy site. I have also placed a referral to home health to have nursing come to your home.   We will plan to see you again in about 6 weeks.   Thanks,  Dr Corrie

## 2023-07-26 NOTE — Assessment & Plan Note (Addendum)
 Patient requested prescription for medication for erectile dysfunction at the end of the visit. Did not have time to discuss new medications, additionally patient is still wearing his C collar. Will discuss this at a future visit.

## 2023-07-27 ENCOUNTER — Other Ambulatory Visit: Payer: Self-pay | Admitting: Internal Medicine

## 2023-07-27 DIAGNOSIS — R251 Tremor, unspecified: Secondary | ICD-10-CM | POA: Diagnosis not present

## 2023-07-27 DIAGNOSIS — Z9889 Other specified postprocedural states: Secondary | ICD-10-CM | POA: Diagnosis not present

## 2023-07-27 DIAGNOSIS — Z87898 Personal history of other specified conditions: Secondary | ICD-10-CM | POA: Diagnosis not present

## 2023-07-27 LAB — LIPID PANEL
Chol/HDL Ratio: 4.8 {ratio} (ref 0.0–5.0)
Cholesterol, Total: 266 mg/dL — ABNORMAL HIGH (ref 100–199)
HDL: 55 mg/dL (ref >39)
LDL Chol Calc (NIH): 197 mg/dL — ABNORMAL HIGH (ref 0–99)
Triglycerides: 82 mg/dL (ref 0–149)
VLDL Cholesterol Cal: 14 mg/dL (ref 5–40)

## 2023-07-27 LAB — BASIC METABOLIC PANEL
BUN/Creatinine Ratio: 16 (ref 10–24)
BUN: 13 mg/dL (ref 8–27)
CO2: 26 mmol/L (ref 20–29)
Calcium: 9.6 mg/dL (ref 8.6–10.2)
Chloride: 96 mmol/L (ref 96–106)
Creatinine, Ser: 0.83 mg/dL (ref 0.76–1.27)
Glucose: 92 mg/dL (ref 70–99)
Potassium: 4.5 mmol/L (ref 3.5–5.2)
Sodium: 139 mmol/L (ref 134–144)
eGFR: 95 mL/min/{1.73_m2} (ref >59)

## 2023-07-27 LAB — CBC
Hematocrit: 44.2 % (ref 37.5–51.0)
Hemoglobin: 14.3 g/dL (ref 13.0–17.7)
MCH: 28.4 pg (ref 26.6–33.0)
MCHC: 32.4 g/dL (ref 31.5–35.7)
MCV: 88 fL (ref 79–97)
Platelets: 377 10*3/uL (ref 150–450)
RBC: 5.04 x10E6/uL (ref 4.14–5.80)
RDW: 13.8 % (ref 11.6–15.4)
WBC: 3.8 10*3/uL (ref 3.4–10.8)

## 2023-07-27 MED ORDER — TADALAFIL 10 MG PO TABS: 10.0000 mg | ORAL_TABLET | ORAL | 1 refills | Status: DC | PRN

## 2023-07-27 MED ORDER — ROSUVASTATIN CALCIUM 20 MG PO TABS: 20.0000 mg | ORAL_TABLET | Freq: Every day | ORAL | 11 refills | Status: DC

## 2023-07-27 NOTE — Progress Notes (Signed)
 Updated patient on lab results, including LDL 197. Restarted rosuvastatin 20mg  which patient has been on previously. Also per patient request refilled Cialis 10mg .

## 2023-07-28 ENCOUNTER — Ambulatory Visit: Payer: Medicare Other | Admitting: Physical Therapy

## 2023-07-28 DIAGNOSIS — M6281 Muscle weakness (generalized): Secondary | ICD-10-CM

## 2023-07-28 DIAGNOSIS — R2681 Unsteadiness on feet: Secondary | ICD-10-CM

## 2023-07-28 DIAGNOSIS — S069X0S Unspecified intracranial injury without loss of consciousness, sequela: Secondary | ICD-10-CM | POA: Diagnosis not present

## 2023-07-28 DIAGNOSIS — R29818 Other symptoms and signs involving the nervous system: Secondary | ICD-10-CM | POA: Diagnosis not present

## 2023-07-28 DIAGNOSIS — M542 Cervicalgia: Secondary | ICD-10-CM | POA: Diagnosis not present

## 2023-07-28 DIAGNOSIS — R2689 Other abnormalities of gait and mobility: Secondary | ICD-10-CM

## 2023-07-28 DIAGNOSIS — R208 Other disturbances of skin sensation: Secondary | ICD-10-CM | POA: Diagnosis not present

## 2023-07-28 DIAGNOSIS — M5459 Other low back pain: Secondary | ICD-10-CM | POA: Diagnosis not present

## 2023-07-28 DIAGNOSIS — R29898 Other symptoms and signs involving the musculoskeletal system: Secondary | ICD-10-CM | POA: Diagnosis not present

## 2023-07-28 DIAGNOSIS — R278 Other lack of coordination: Secondary | ICD-10-CM | POA: Diagnosis not present

## 2023-07-28 DIAGNOSIS — M25579 Pain in unspecified ankle and joints of unspecified foot: Secondary | ICD-10-CM | POA: Diagnosis not present

## 2023-07-28 NOTE — Progress Notes (Signed)
 [facility] Attending  Case discussed with the resident at the time of the visit.  We reviewed the resident's history and exam and pertinent patient test results.  I agree with the assessment, diagnosis, and plan of care documented in the resident's note.

## 2023-07-28 NOTE — Addendum Note (Signed)
 Addended by: Burnell Blanks on: 07/28/2023 10:46 AM   Modules accepted: Level of Service
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
## 2023-08-02 ENCOUNTER — Ambulatory Visit: Payer: Medicare Other | Admitting: Physical Therapy

## 2023-08-02 ENCOUNTER — Ambulatory Visit: Payer: Medicare Other | Admitting: Occupational Therapy

## 2023-08-02 DIAGNOSIS — M25579 Pain in unspecified ankle and joints of unspecified foot: Secondary | ICD-10-CM | POA: Diagnosis not present

## 2023-08-02 DIAGNOSIS — R29818 Other symptoms and signs involving the nervous system: Secondary | ICD-10-CM | POA: Diagnosis not present

## 2023-08-02 DIAGNOSIS — M542 Cervicalgia: Secondary | ICD-10-CM

## 2023-08-02 DIAGNOSIS — R2689 Other abnormalities of gait and mobility: Secondary | ICD-10-CM

## 2023-08-02 DIAGNOSIS — R278 Other lack of coordination: Secondary | ICD-10-CM | POA: Diagnosis not present

## 2023-08-02 DIAGNOSIS — M5459 Other low back pain: Secondary | ICD-10-CM | POA: Diagnosis not present

## 2023-08-02 DIAGNOSIS — M6281 Muscle weakness (generalized): Secondary | ICD-10-CM | POA: Diagnosis not present

## 2023-08-02 DIAGNOSIS — R2681 Unsteadiness on feet: Secondary | ICD-10-CM | POA: Diagnosis not present

## 2023-08-02 DIAGNOSIS — S069X0S Unspecified intracranial injury without loss of consciousness, sequela: Secondary | ICD-10-CM | POA: Diagnosis not present

## 2023-08-02 DIAGNOSIS — R29898 Other symptoms and signs involving the musculoskeletal system: Secondary | ICD-10-CM | POA: Diagnosis not present

## 2023-08-02 DIAGNOSIS — R208 Other disturbances of skin sensation: Secondary | ICD-10-CM

## 2023-08-02 NOTE — Patient Instructions (Addendum)
   You can place your arms out to the sides with pillows underneath or place a pillow across your stomach with your hands resting on top for support. Hold a pillow with your arms away from your body.  Do not bend your arms at the elbows or tuck your hands under your head. Do not place your hand on top or underneath pillow. Use outside of pillow as a border.
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
## 2023-08-08 ENCOUNTER — Telehealth: Payer: Self-pay

## 2023-08-08 ENCOUNTER — Other Ambulatory Visit: Payer: Self-pay | Admitting: Physical Medicine and Rehabilitation

## 2023-08-08 ENCOUNTER — Ambulatory Visit (HOSPITAL_COMMUNITY): Admission: RE | Admit: 2023-08-08 | Payer: Medicare Other | Source: Ambulatory Visit

## 2023-08-08 ENCOUNTER — Other Ambulatory Visit: Payer: Self-pay

## 2023-08-08 DIAGNOSIS — I10 Essential (primary) hypertension: Secondary | ICD-10-CM

## 2023-08-08 DIAGNOSIS — S12121A Other nondisplaced dens fracture, initial encounter for closed fracture: Secondary | ICD-10-CM | POA: Diagnosis not present

## 2023-08-08 NOTE — Telephone Encounter (Signed)
Patients daughter called requesting refills, she is also requesting a refill for Certavite I do not see that medication on the patients medication list.
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
## 2023-08-08 NOTE — Telephone Encounter (Signed)
Patients daughter called she wants to know if the patient can start back taking his fish oil and Asprin. Please return her call [phone]

## 2023-08-09 ENCOUNTER — Encounter: Payer: Medicare Other | Admitting: Speech Pathology

## 2023-08-09 ENCOUNTER — Encounter: Payer: Medicare Other | Admitting: Physical Medicine and Rehabilitation

## 2023-08-10 ENCOUNTER — Ambulatory Visit: Payer: Medicare Other | Admitting: Physical Therapy

## 2023-08-10 ENCOUNTER — Telehealth: Payer: Self-pay | Admitting: *Deleted

## 2023-08-10 ENCOUNTER — Ambulatory Visit: Payer: Medicare Other | Admitting: Occupational Therapy

## 2023-08-10 DIAGNOSIS — S069X0S Unspecified intracranial injury without loss of consciousness, sequela: Secondary | ICD-10-CM | POA: Diagnosis not present

## 2023-08-10 DIAGNOSIS — R29898 Other symptoms and signs involving the musculoskeletal system: Secondary | ICD-10-CM | POA: Diagnosis not present

## 2023-08-10 DIAGNOSIS — R2681 Unsteadiness on feet: Secondary | ICD-10-CM | POA: Diagnosis not present

## 2023-08-10 DIAGNOSIS — R2689 Other abnormalities of gait and mobility: Secondary | ICD-10-CM | POA: Diagnosis not present

## 2023-08-10 DIAGNOSIS — R29818 Other symptoms and signs involving the nervous system: Secondary | ICD-10-CM | POA: Diagnosis not present

## 2023-08-10 DIAGNOSIS — M25579 Pain in unspecified ankle and joints of unspecified foot: Secondary | ICD-10-CM | POA: Diagnosis not present

## 2023-08-10 DIAGNOSIS — M6281 Muscle weakness (generalized): Secondary | ICD-10-CM

## 2023-08-10 DIAGNOSIS — R208 Other disturbances of skin sensation: Secondary | ICD-10-CM | POA: Diagnosis not present

## 2023-08-10 DIAGNOSIS — M5459 Other low back pain: Secondary | ICD-10-CM | POA: Diagnosis not present

## 2023-08-10 DIAGNOSIS — M542 Cervicalgia: Secondary | ICD-10-CM | POA: Diagnosis not present

## 2023-08-10 DIAGNOSIS — R278 Other lack of coordination: Secondary | ICD-10-CM | POA: Diagnosis not present

## 2023-08-10 MED ORDER — TRIAMTERENE-HCTZ 37.5-25 MG PO TABS: 1.0000 | ORAL_TABLET | Freq: Every day | ORAL | 0 refills | Status: DC

## 2023-08-10 MED ORDER — AMLODIPINE BESYLATE 10 MG PO TABS: 10.0000 mg | ORAL_TABLET | Freq: Every day | ORAL | 0 refills | Status: DC

## 2023-08-10 MED ORDER — VALPROIC ACID 250 MG PO CAPS: 250.0000 mg | ORAL_CAPSULE | Freq: Two times a day (BID) | ORAL | 0 refills | Status: DC
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
## 2023-08-10 NOTE — Telephone Encounter (Signed)
An appt became available - pt was/daughter stated they can come tomorrow 08/10/22 @ 1045 AM.
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
## 2023-08-10 NOTE — Telephone Encounter (Signed)
Call from pt's daughter who stated pt c/o hands swollen. Pt was added to the conversation. Pt stated he noticed the swelling of his hands yesterday. And also PT noticed it today at his appt. Pt stated sometimes the pain goes from his shoulders down his arms to his hands.No available appts today nor tomorrow at this time. An appt schedule 2/5 @ [phone] with Dr Hessie Diener. Informed pt/pt's daughter if swelling/pain increase to go to an UC -stated they understand.

## 2023-08-11 ENCOUNTER — Ambulatory Visit (INDEPENDENT_AMBULATORY_CARE_PROVIDER_SITE_OTHER): Payer: Medicare Other | Admitting: Student

## 2023-08-11 ENCOUNTER — Ambulatory Visit (HOSPITAL_COMMUNITY)
Admission: RE | Admit: 2023-08-11 | Discharge: 2023-08-11 | Disposition: A | Discharge status: Home / Self Care | Payer: Medicare Other | Source: Ambulatory Visit | Attending: Internal Medicine | Admitting: Internal Medicine

## 2023-08-11 VITALS — BP 145/93 | HR 72 | Temp 97.5°F | Ht 68.0 in | Wt 153.7 lb

## 2023-08-11 DIAGNOSIS — M7989 Other specified soft tissue disorders: Secondary | ICD-10-CM | POA: Insufficient documentation

## 2023-08-11 DIAGNOSIS — M19041 Primary osteoarthritis, right hand: Secondary | ICD-10-CM | POA: Diagnosis not present

## 2023-08-11 DIAGNOSIS — E039 Hypothyroidism, unspecified: Secondary | ICD-10-CM | POA: Diagnosis not present

## 2023-08-11 MED ORDER — DICLOFENAC SODIUM 1 % EX GEL: 2.0000 g | Freq: Four times a day (QID) | CUTANEOUS | 0 refills | Status: DC

## 2023-08-11 NOTE — Patient Instructions (Addendum)
Thank you, [name] for allowing [name] to provide your care today. Today we discussed arthritis of the hands due to possible arthritis.    I have ordered the following labs for you:   Lab Orders         CMP14 + Anion Gap         TSH      Referrals ordered today:   Referral Orders  No referral(s) requested today     I have ordered the following medication/changed the following medications:   Stop the following medications: Medications Discontinued During This Encounter  Medication Reason   diclofenac Sodium (VOLTAREN) 1 % GEL Completed Course     Start the following medications: Meds ordered this encounter  Medications   diclofenac Sodium (VOLTAREN) 1 % GEL    Sig: Apply 2 g topically to right ankle 4 (four) times daily.    Dispense:  350 g    Refill:  0     Follow up: 2-4 weeks     Should you have any questions or concerns please call the internal medicine clinic at [phone].     Please note that our late policy has changed.  If you are more than 15 minutes late to your appointment, you may be asked to reschedule your appointment.  Dr. Hessie Diener, D.O. [facility]
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
## 2023-08-12 LAB — CMP14 + ANION GAP
ALT: 23 [IU]/L (ref 0–44)
AST: 15 [IU]/L (ref 0–40)
Albumin: 4.5 g/dL (ref 3.9–4.9)
Alkaline Phosphatase: 74 [IU]/L (ref 44–121)
Anion Gap: 18 mmol/L (ref 10.0–18.0)
BUN/Creatinine Ratio: 18 (ref 10–24)
BUN: 13 mg/dL (ref 8–27)
Bilirubin Total: 0.2 mg/dL (ref 0.0–1.2)
CO2: 22 mmol/L (ref 20–29)
Calcium: 9.9 mg/dL (ref 8.6–10.2)
Chloride: 98 mmol/L (ref 96–106)
Creatinine, Ser: 0.72 mg/dL — ABNORMAL LOW (ref 0.76–1.27)
Globulin, Total: 2.8 g/dL (ref 1.5–4.5)
Glucose: 96 mg/dL (ref 70–99)
Potassium: 4.4 mmol/L (ref 3.5–5.2)
Sodium: 138 mmol/L (ref 134–144)
Total Protein: 7.3 g/dL (ref 6.0–8.5)
eGFR: 100 mL/min/{1.73_m2} (ref >59)

## 2023-08-12 LAB — TSH: TSH: 1.5 u[IU]/mL (ref 0.450–4.500)

## 2023-08-14 ENCOUNTER — Encounter: Payer: Self-pay | Admitting: Speech Pathology

## 2023-08-14 ENCOUNTER — Ambulatory Visit: Payer: Medicare Other | Admitting: Speech Pathology

## 2023-08-14 DIAGNOSIS — M25579 Pain in unspecified ankle and joints of unspecified foot: Secondary | ICD-10-CM | POA: Diagnosis not present

## 2023-08-14 DIAGNOSIS — R2689 Other abnormalities of gait and mobility: Secondary | ICD-10-CM | POA: Diagnosis not present

## 2023-08-14 DIAGNOSIS — R41841 Cognitive communication deficit: Secondary | ICD-10-CM

## 2023-08-14 DIAGNOSIS — M5459 Other low back pain: Secondary | ICD-10-CM | POA: Diagnosis not present

## 2023-08-14 DIAGNOSIS — R2681 Unsteadiness on feet: Secondary | ICD-10-CM | POA: Diagnosis not present

## 2023-08-14 DIAGNOSIS — R208 Other disturbances of skin sensation: Secondary | ICD-10-CM | POA: Diagnosis not present

## 2023-08-14 DIAGNOSIS — M6281 Muscle weakness (generalized): Secondary | ICD-10-CM | POA: Diagnosis not present

## 2023-08-14 DIAGNOSIS — R29818 Other symptoms and signs involving the nervous system: Secondary | ICD-10-CM | POA: Diagnosis not present

## 2023-08-14 DIAGNOSIS — R29898 Other symptoms and signs involving the musculoskeletal system: Secondary | ICD-10-CM | POA: Diagnosis not present

## 2023-08-14 DIAGNOSIS — S069X0S Unspecified intracranial injury without loss of consciousness, sequela: Secondary | ICD-10-CM | POA: Diagnosis not present

## 2023-08-14 DIAGNOSIS — M542 Cervicalgia: Secondary | ICD-10-CM | POA: Diagnosis not present

## 2023-08-14 DIAGNOSIS — R278 Other lack of coordination: Secondary | ICD-10-CM | POA: Diagnosis not present
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
## 2023-08-14 NOTE — Progress Notes (Signed)
Internal Medicine Clinic Attending  Case discussed with the resident at the time of the visit.  We reviewed the resident's history and exam and pertinent patient test results.  I agree with the assessment, diagnosis, and plan of care documented in the resident's note.

## 2023-08-15 ENCOUNTER — Ambulatory Visit: Payer: Medicare Other | Admitting: Physical Therapy

## 2023-08-15 ENCOUNTER — Encounter: Payer: Medicare Other | Admitting: Occupational Therapy

## 2023-08-16 ENCOUNTER — Ambulatory Visit: Payer: Medicare Other | Admitting: Occupational Therapy

## 2023-08-16 ENCOUNTER — Ambulatory Visit: Payer: Medicare Other | Admitting: Physical Therapy

## 2023-08-16 DIAGNOSIS — R2681 Unsteadiness on feet: Secondary | ICD-10-CM

## 2023-08-16 DIAGNOSIS — R208 Other disturbances of skin sensation: Secondary | ICD-10-CM

## 2023-08-16 DIAGNOSIS — M6281 Muscle weakness (generalized): Secondary | ICD-10-CM

## 2023-08-16 DIAGNOSIS — R2689 Other abnormalities of gait and mobility: Secondary | ICD-10-CM | POA: Diagnosis not present

## 2023-08-16 DIAGNOSIS — M542 Cervicalgia: Secondary | ICD-10-CM

## 2023-08-16 DIAGNOSIS — R278 Other lack of coordination: Secondary | ICD-10-CM | POA: Diagnosis not present

## 2023-08-16 DIAGNOSIS — R29898 Other symptoms and signs involving the musculoskeletal system: Secondary | ICD-10-CM | POA: Diagnosis not present

## 2023-08-16 DIAGNOSIS — S069X0S Unspecified intracranial injury without loss of consciousness, sequela: Secondary | ICD-10-CM | POA: Diagnosis not present

## 2023-08-16 DIAGNOSIS — M25579 Pain in unspecified ankle and joints of unspecified foot: Secondary | ICD-10-CM | POA: Diagnosis not present

## 2023-08-16 DIAGNOSIS — R29818 Other symptoms and signs involving the nervous system: Secondary | ICD-10-CM | POA: Diagnosis not present

## 2023-08-16 DIAGNOSIS — M5459 Other low back pain: Secondary | ICD-10-CM | POA: Diagnosis not present
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
## 2023-08-22 ENCOUNTER — Ambulatory Visit: Payer: Medicare Other

## 2023-08-22 ENCOUNTER — Other Ambulatory Visit: Payer: Self-pay

## 2023-08-22 ENCOUNTER — Ambulatory Visit: Payer: Medicare Other | Admitting: Physical Therapy

## 2023-08-22 ENCOUNTER — Ambulatory Visit: Payer: Medicare Other | Attending: Physician Assistant | Admitting: Occupational Therapy

## 2023-08-22 DIAGNOSIS — R2681 Unsteadiness on feet: Secondary | ICD-10-CM | POA: Insufficient documentation

## 2023-08-22 DIAGNOSIS — M542 Cervicalgia: Secondary | ICD-10-CM | POA: Diagnosis not present

## 2023-08-22 DIAGNOSIS — M6281 Muscle weakness (generalized): Secondary | ICD-10-CM | POA: Diagnosis not present

## 2023-08-22 DIAGNOSIS — R208 Other disturbances of skin sensation: Secondary | ICD-10-CM | POA: Diagnosis not present

## 2023-08-22 DIAGNOSIS — R2689 Other abnormalities of gait and mobility: Secondary | ICD-10-CM | POA: Insufficient documentation

## 2023-08-22 DIAGNOSIS — R278 Other lack of coordination: Secondary | ICD-10-CM | POA: Diagnosis not present

## 2023-08-22 DIAGNOSIS — R29898 Other symptoms and signs involving the musculoskeletal system: Secondary | ICD-10-CM | POA: Diagnosis not present

## 2023-08-22 DIAGNOSIS — S069X0S Unspecified intracranial injury without loss of consciousness, sequela: Secondary | ICD-10-CM | POA: Diagnosis not present

## 2023-08-22 DIAGNOSIS — R29818 Other symptoms and signs involving the nervous system: Secondary | ICD-10-CM | POA: Diagnosis not present

## 2023-08-22 NOTE — Patient Instructions (Signed)
 [name]
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
## 2023-08-23 ENCOUNTER — Encounter: Payer: Medicare Other | Admitting: Student

## 2023-08-25 ENCOUNTER — Ambulatory Visit (INDEPENDENT_AMBULATORY_CARE_PROVIDER_SITE_OTHER): Payer: Medicare Other | Admitting: Internal Medicine

## 2023-08-25 VITALS — BP 136/83 | HR 85 | Temp 97.8°F | Ht 68.0 in | Wt 154.7 lb

## 2023-08-25 DIAGNOSIS — G8929 Other chronic pain: Secondary | ICD-10-CM | POA: Diagnosis not present

## 2023-08-25 DIAGNOSIS — K7689 Other specified diseases of liver: Secondary | ICD-10-CM

## 2023-08-25 DIAGNOSIS — M25512 Pain in left shoulder: Secondary | ICD-10-CM

## 2023-08-25 DIAGNOSIS — S12100A Unspecified displaced fracture of second cervical vertebra, initial encounter for closed fracture: Secondary | ICD-10-CM | POA: Diagnosis not present

## 2023-08-25 DIAGNOSIS — M25511 Pain in right shoulder: Secondary | ICD-10-CM | POA: Diagnosis not present

## 2023-08-25 MED ORDER — GABAPENTIN 300 MG PO CAPS: ORAL_CAPSULE | ORAL | 2 refills | Status: DC
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
## 2023-08-25 NOTE — Assessment & Plan Note (Signed)
 He missed appointment for ultrasound.  This was rescheduled during office visit.  Unfortunately he has other doctors appointments on that day.  His daughter is aware and will call number to reschedule.
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
## 2023-08-25 NOTE — Patient Instructions (Signed)
 Thank you, [name] for allowing us  to provide your care today.   Pain in shoulder I am increasing your dose of gabapentin  to 1 tablet at breakfast and lunch, then 2 tablets at dinner. Please follow-up with Dr. Emeline next week.  Please ask the neurologist on 2/13 about restarting aspirin .  Please reschedule the ultrasound of your liver.  I have ordered the following medication/changed the following medications:   Stop the following medications: Medications Discontinued During This Encounter  Medication Reason   gabapentin  (NEURONTIN ) 300 MG capsule      Start the following medications: Meds ordered this encounter  Medications   gabapentin  (NEURONTIN ) 300 MG capsule    Sig: Take 1 tablet at breakfast and lunch, take 2 tablets at night    Dispense:  120 capsule    Refill:  2     Follow up: 3 months   We look forward to seeing you next time. Please call our clinic at [phone] if you have any questions or concerns. The best time to call is Monday-Friday from 9am-4pm, but there is someone available 24/7. If after hours or the weekend, call the main hospital number and ask for the Internal Medicine Resident On-Call. If you need medication refills, please notify your pharmacy one week in advance and they will send us  a request.   Thank you for trusting me with your care. Wishing you the best!   Izetta Medley, DO [facility]

## 2023-08-28 NOTE — Progress Notes (Signed)
 Internal Medicine Clinic Attending  Case discussed with the resident physician at the time of the visit.  We reviewed the patient's history, exam, and pertinent patient test results.  I agree with the assessment, diagnosis, and plan of care documented in the resident's note.

## 2023-08-29 ENCOUNTER — Ambulatory Visit: Payer: Medicare Other | Admitting: Physical Therapy

## 2023-08-29 ENCOUNTER — Ambulatory Visit: Payer: Medicare Other | Admitting: Occupational Therapy

## 2023-08-29 ENCOUNTER — Encounter: Payer: Self-pay | Admitting: Physical Therapy

## 2023-08-29 VITALS — BP 145/89 | HR 71

## 2023-08-29 DIAGNOSIS — R29818 Other symptoms and signs involving the nervous system: Secondary | ICD-10-CM

## 2023-08-29 DIAGNOSIS — R2681 Unsteadiness on feet: Secondary | ICD-10-CM | POA: Diagnosis not present

## 2023-08-29 DIAGNOSIS — R29898 Other symptoms and signs involving the musculoskeletal system: Secondary | ICD-10-CM | POA: Diagnosis not present

## 2023-08-29 DIAGNOSIS — R208 Other disturbances of skin sensation: Secondary | ICD-10-CM

## 2023-08-29 DIAGNOSIS — R2689 Other abnormalities of gait and mobility: Secondary | ICD-10-CM

## 2023-08-29 DIAGNOSIS — M542 Cervicalgia: Secondary | ICD-10-CM | POA: Diagnosis not present

## 2023-08-29 DIAGNOSIS — S069X0S Unspecified intracranial injury without loss of consciousness, sequela: Secondary | ICD-10-CM | POA: Diagnosis not present

## 2023-08-29 DIAGNOSIS — M6281 Muscle weakness (generalized): Secondary | ICD-10-CM | POA: Diagnosis not present

## 2023-08-29 DIAGNOSIS — R278 Other lack of coordination: Secondary | ICD-10-CM | POA: Diagnosis not present
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
## 2023-08-30 ENCOUNTER — Encounter
Payer: Medicare Other | Attending: Physical Medicine and Rehabilitation | Admitting: Physical Medicine and Rehabilitation

## 2023-08-30 ENCOUNTER — Ambulatory Visit (HOSPITAL_COMMUNITY): Admission: RE | Admit: 2023-08-30 | Payer: Medicare Other | Source: Ambulatory Visit

## 2023-08-30 ENCOUNTER — Encounter: Payer: Self-pay | Admitting: Physical Medicine and Rehabilitation

## 2023-08-30 VITALS — BP 133/88 | HR 76 | Ht 68.0 in | Wt 155.0 lb

## 2023-08-30 DIAGNOSIS — M5021 Other cervical disc displacement,  high cervical region: Secondary | ICD-10-CM | POA: Diagnosis not present

## 2023-08-30 DIAGNOSIS — M47813 Spondylosis without myelopathy or radiculopathy, cervicothoracic region: Secondary | ICD-10-CM | POA: Diagnosis not present

## 2023-08-30 DIAGNOSIS — S12131D Unspecified traumatic nondisplaced spondylolisthesis of second cervical vertebra, subsequent encounter for fracture with routine healing: Secondary | ICD-10-CM | POA: Diagnosis not present

## 2023-08-30 DIAGNOSIS — S12121A Other nondisplaced dens fracture, initial encounter for closed fracture: Secondary | ICD-10-CM | POA: Diagnosis not present

## 2023-08-30 DIAGNOSIS — S12131S Unspecified traumatic nondisplaced spondylolisthesis of second cervical vertebra, sequela: Secondary | ICD-10-CM | POA: Diagnosis not present

## 2023-08-30 DIAGNOSIS — S12131A Unspecified traumatic nondisplaced spondylolisthesis of second cervical vertebra, initial encounter for closed fracture: Secondary | ICD-10-CM | POA: Insufficient documentation

## 2023-08-30 DIAGNOSIS — M25511 Pain in right shoulder: Secondary | ICD-10-CM

## 2023-08-30 DIAGNOSIS — M7989 Other specified soft tissue disorders: Secondary | ICD-10-CM

## 2023-08-30 DIAGNOSIS — M25512 Pain in left shoulder: Secondary | ICD-10-CM | POA: Diagnosis not present

## 2023-08-30 DIAGNOSIS — G5690 Unspecified mononeuropathy of unspecified upper limb: Secondary | ICD-10-CM | POA: Diagnosis not present

## 2023-08-30 DIAGNOSIS — S069X9S Unspecified intracranial injury with loss of consciousness of unspecified duration, sequela: Secondary | ICD-10-CM | POA: Diagnosis not present

## 2023-08-30 DIAGNOSIS — S069X9D Unspecified intracranial injury with loss of consciousness of unspecified duration, subsequent encounter: Secondary | ICD-10-CM | POA: Diagnosis not present

## 2023-08-30 DIAGNOSIS — R200 Anesthesia of skin: Secondary | ICD-10-CM | POA: Diagnosis not present

## 2023-08-30 DIAGNOSIS — M4802 Spinal stenosis, cervical region: Secondary | ICD-10-CM | POA: Diagnosis not present

## 2023-08-30 MED ORDER — METHOCARBAMOL 750 MG PO TABS: 750.0000 mg | ORAL_TABLET | Freq: Three times a day (TID) | ORAL | 1 refills | Status: DC | PRN

## 2023-08-30 MED ORDER — MELOXICAM 15 MG PO TABS: 15.0000 mg | ORAL_TABLET | Freq: Every day | ORAL | 0 refills | Status: DC
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
## 2023-08-30 NOTE — Patient Instructions (Signed)
Stop gabapentin as this has not been helpful for you and is causing stomach issues.  We briefly discussed starting Lyrica today, but also feel this is unlikely to be helpful.  Start Robaxin 750 mg up to 4 times daily as needed for pain in your neck and shoulders; I recommend starting this at nighttime because it can be sedating.  For pain and swelling in your bilateral hands with x-ray showing worsening OA recently, start meloxicam 15 mg daily for 14 days.  After this, if pain and swelling does not improve, inform our clinic and we can try a steroid Dosepak.  Anticipate your symptoms will get much better once you are cleared from a cervical collar, which is pending MRIs today.  Likely the collar is pressing down on your shoulders and neck at nighttime, compressing some of those nerves and causing problems.  Neck exam today was benign.  Resume PT when cleared by neurosurgery  Follow-up as needed if symptoms do not improve.

## 2023-08-31 ENCOUNTER — Encounter: Payer: Self-pay | Admitting: Neurology

## 2023-08-31 ENCOUNTER — Ambulatory Visit: Payer: Medicare Other | Admitting: Neurology

## 2023-08-31 VITALS — BP 133/93 | HR 82 | Ht 68.5 in | Wt 154.0 lb

## 2023-08-31 DIAGNOSIS — R561 Post traumatic seizures: Secondary | ICD-10-CM | POA: Diagnosis not present

## 2023-08-31 DIAGNOSIS — S069XAD Unspecified intracranial injury with loss of consciousness status unknown, subsequent encounter: Secondary | ICD-10-CM | POA: Diagnosis not present

## 2023-08-31 MED ORDER — VALPROIC ACID 250 MG PO CAPS: 250.0000 mg | ORAL_CAPSULE | Freq: Two times a day (BID) | ORAL | 0 refills | Status: DC

## 2023-08-31 NOTE — Patient Instructions (Signed)
Continue with Depakote for a total of 6 months  Follow up in 3 months, at that time, will discontinue Depakote if no seizure Continue to follow up with Neurosurgery  Return sooner if worse
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
## 2023-09-05 ENCOUNTER — Encounter: Payer: Medicare Other | Admitting: Occupational Therapy

## 2023-09-05 ENCOUNTER — Ambulatory Visit: Payer: Medicare Other | Admitting: Physical Therapy

## 2023-09-05 DIAGNOSIS — S12121A Other nondisplaced dens fracture, initial encounter for closed fracture: Secondary | ICD-10-CM | POA: Diagnosis not present

## 2023-09-08 ENCOUNTER — Telehealth: Payer: Self-pay | Admitting: Physical Medicine and Rehabilitation

## 2023-09-08 NOTE — Telephone Encounter (Signed)
Patient of Dr Shearon Stalls, is asking if they can get the dosepak since pain is still present and not getting any better, can this be done on behalf of Dr Shearon Stalls

## 2023-09-08 NOTE — Telephone Encounter (Signed)
Call placed to Mr. Danzy, no answer.  Dr Shearon Stalls note reviewed from 08/30/2023  Dr Shearon Stalls ordered. Meloxicam for 14 days if no relief a steroid pak could be ordered.  Will await Mr. Hosier return call, regarding the above.

## 2023-09-08 NOTE — Telephone Encounter (Signed)
Patient is calling stating pain is not any better. Note from 08/30/23 states- For pain and swelling in your bilateral hands with x-ray showing worsening OA recently, start meloxicam 15 mg daily for 14 days. After this, if pain and swelling does not improve, inform our clinic and we can try a steroid Dosepak.   Please call daughter

## 2023-09-11 ENCOUNTER — Telehealth: Payer: Self-pay | Admitting: Physical Medicine and Rehabilitation

## 2023-09-11 DIAGNOSIS — M7989 Other specified soft tissue disorders: Secondary | ICD-10-CM

## 2023-09-11 MED ORDER — METHYLPREDNISOLONE 4 MG PO TBPK: ORAL_TABLET | ORAL | 0 refills | Status: DC

## 2023-09-11 NOTE — Telephone Encounter (Signed)
 Sent to patient's [facility] pharmacy

## 2023-09-11 NOTE — Telephone Encounter (Signed)
 Melixocam isn't working and would like to start steroid dose pack. Please call patient.

## 2023-09-12 ENCOUNTER — Encounter: Payer: Medicare Other | Admitting: Occupational Therapy

## 2023-09-12 ENCOUNTER — Ambulatory Visit: Payer: Medicare Other | Admitting: Physical Therapy

## 2023-09-12 ENCOUNTER — Other Ambulatory Visit: Payer: Self-pay | Admitting: Student

## 2023-09-12 NOTE — Progress Notes (Unsigned)
 Subjective:    Patient ID: [name], male    DOB: May 15, 1956, 68 y.o.   MRN: [id number]  HPI   Pain Inventory Average Pain {NUMBERS; 0-10:5044} Pain Right Now {NUMBERS; 0-10:5044} My pain is {PAIN DESCRIPTION:21022940}  In the last 24 hours, has pain interfered with the following? General activity {NUMBERS; 0-10:5044} Relation with others {NUMBERS; 0-10:5044} Enjoyment of life {NUMBERS; 0-10:5044} What TIME of day is your pain at its worst? {time of day:24191} Sleep (in general) {BHH GOOD/FAIR/POOR:22877}  Pain is worse with: {ACTIVITIES:21022942} Pain improves with: {PAIN IMPROVES WUJW:11914782} Relief from Meds: {NUMBERS; 0-10:5044}  Family History  Problem Relation Age of Onset   Pancreatic cancer Father    Colon cancer Neg Hx    Esophageal cancer Neg Hx    Rectal cancer Neg Hx    Stomach cancer Neg Hx    Social History   Socioeconomic History   Marital status: Single    Spouse name: Not on file   Number of children: Not on file   Years of education: Not on file   Highest education level: Not on file  Occupational History   Not on file  Tobacco Use   Smoking status: Former    Current packs/day: 0.00    Types: Cigarettes    Quit date: 07/18/2012    Years since quitting: 11.1   Smokeless tobacco: Never  Vaping Use   Vaping status: Never Used  Substance and Sexual Activity   Alcohol use: No    Alcohol/week: 0.0 standard drinks of alcohol    Comment: quit 2011   Drug use: No    Comment: h/o cocaine abuse   Sexual activity: Not on file  Other Topics Concern   Not on file  Social History Narrative   Not on file   Social Drivers of Health   Financial Resource Strain: Low Risk  (05/12/2022)   Overall Financial Resource Strain (CARDIA)    Difficulty of Paying Living Expenses: Not hard at all  Food Insecurity: Patient Unable To Answer (06/06/2023)   Hunger Vital Sign    Worried About Programme researcher, broadcasting/film/video in the Last Year: Patient unable to answer     Ran Out of Food in the Last Year: Patient unable to answer  Transportation Needs: Patient Unable To Answer (06/06/2023)   PRAPARE - Transportation    Lack of Transportation (Medical): Patient unable to answer    Lack of Transportation (Non-Medical): Patient unable to answer  Physical Activity: Unknown (05/12/2022)   Exercise Vital Sign    Days of Exercise per Week: 7 days    Minutes of Exercise per Session: Not on file  Stress: No Stress Concern Present (05/12/2022)   [employer] of Occupational Health - Occupational Stress Questionnaire    Feeling of Stress : Not at all  Social Connections: Moderately Isolated (05/12/2022)   Social Connection and Isolation Panel [NHANES]    Frequency of Communication with Friends and Family: More than three times a week    Frequency of Social Gatherings with Friends and Family: More than three times a week    Attends Religious Services: More than 4 times per year    Active Member of [employer] or Organizations: No    Attends Engineer, structural: Never    Marital Status: Separated   Past Surgical History:  Procedure Laterality Date   ANTERIOR CERVICAL DECOMPRESSION/DISCECTOMY FUSION 4 LEVELS N/A 02/18/2021   Procedure: ANTERIOR CERVICAL DECOMPRESSION FUSION CERVIAL 4- CERVICAL 5, CERVICAL 5- CERVICAL 6, CERVICAL  6- CERVICAL 7 WITH INSTRUMENTATION AND ALLOGRAFT;  Surgeon: Estill Bamberg, MD;  Location: [facility] OR;  Service: Orthopedics;  Laterality: N/A;   bone spur repair Left    COLONOSCOPY     HAND SURGERY     left hand   TRACHEOSTOMY TUBE PLACEMENT N/A 06/19/2023   Procedure: TRACHEOSTOMY;  Surgeon: Diamantina Monks, MD;  Location: [facility] OR;  Service: General;  Laterality: N/A;   Past Surgical History:  Procedure Laterality Date   ANTERIOR CERVICAL DECOMPRESSION/DISCECTOMY FUSION 4 LEVELS N/A 02/18/2021   Procedure: ANTERIOR CERVICAL DECOMPRESSION FUSION CERVIAL 4- CERVICAL 5, CERVICAL 5- CERVICAL 6, CERVICAL 6- CERVICAL 7 WITH  INSTRUMENTATION AND ALLOGRAFT;  Surgeon: Estill Bamberg, MD;  Location: [facility] OR;  Service: Orthopedics;  Laterality: N/A;   bone spur repair Left    COLONOSCOPY     HAND SURGERY     left hand   TRACHEOSTOMY TUBE PLACEMENT N/A 06/19/2023   Procedure: TRACHEOSTOMY;  Surgeon: Diamantina Monks, MD;  Location: [facility] OR;  Service: General;  Laterality: N/A;   Past Medical History:  Diagnosis Date   Arthritis    due to age   Back pain 05/22/2019   Bilateral varicoceles 08/28/2008   w/scrotal nodules on ultrasound likley granulomas   Cocaine abuse (HCC)    quit   ETOH abuse    quit   Headache    Hx of adenomatous polyp of colon    Hyperlipidemia    Hypertension    PONV (postoperative nausea and vomiting)    Right arm pain 05/29/2019   Stroke (HCC)    posterior limb, R internal capsule stroke 07/2012   There were no vitals taken for this visit.  Opioid Risk Score:   Fall Risk Score:  `1  Depression screen [facility] 2/9     05/12/2022    3:19 PM 05/12/2022    2:40 PM 02/04/2022   12:09 PM 01/25/2022   12:26 PM 01/12/2022    1:33 PM 12/28/2021    2:35 PM 10/19/2021    9:16 AM  Depression screen [facility] 2/9  Decreased Interest 0 0 0 0 0 0 0  Down, Depressed, Hopeless 0 0 0 0 0 0 0  PHQ - 2 Score 0 0 0 0 0 0 0  Altered sleeping       0  Tired, decreased energy       0  Change in appetite       0  Feeling bad or failure about yourself        0  Trouble concentrating       0  Moving slowly or fidgety/restless       0  Suicidal thoughts       0  PHQ-9 Score       0  Difficult doing work/chores       Not difficult at all    Review of Systems     Objective:   Physical Exam        Assessment & Plan:

## 2023-09-13 ENCOUNTER — Encounter (HOSPITAL_BASED_OUTPATIENT_CLINIC_OR_DEPARTMENT_OTHER): Payer: Medicare Other | Admitting: Physical Medicine and Rehabilitation

## 2023-09-13 ENCOUNTER — Ambulatory Visit
Admission: RE | Admit: 2023-09-13 | Discharge: 2023-09-13 | Disposition: A | Discharge status: Home / Self Care | Payer: Medicare Other | Source: Ambulatory Visit | Attending: Physical Medicine and Rehabilitation | Admitting: Physical Medicine and Rehabilitation

## 2023-09-13 ENCOUNTER — Ambulatory Visit: Payer: Medicare Other | Admitting: Physical Therapy

## 2023-09-13 ENCOUNTER — Ambulatory Visit: Payer: Medicare Other | Admitting: Occupational Therapy

## 2023-09-13 ENCOUNTER — Encounter: Payer: Self-pay | Admitting: Physical Therapy

## 2023-09-13 ENCOUNTER — Encounter: Payer: Self-pay | Admitting: Physical Medicine and Rehabilitation

## 2023-09-13 VITALS — BP 160/92 | HR 75 | Ht 68.5 in | Wt 154.0 lb

## 2023-09-13 VITALS — BP 160/93 | HR 81

## 2023-09-13 DIAGNOSIS — M7989 Other specified soft tissue disorders: Secondary | ICD-10-CM | POA: Diagnosis not present

## 2023-09-13 DIAGNOSIS — R278 Other lack of coordination: Secondary | ICD-10-CM | POA: Diagnosis not present

## 2023-09-13 DIAGNOSIS — M6281 Muscle weakness (generalized): Secondary | ICD-10-CM | POA: Diagnosis not present

## 2023-09-13 DIAGNOSIS — M542 Cervicalgia: Secondary | ICD-10-CM

## 2023-09-13 DIAGNOSIS — M25511 Pain in right shoulder: Secondary | ICD-10-CM

## 2023-09-13 DIAGNOSIS — S12131S Unspecified traumatic nondisplaced spondylolisthesis of second cervical vertebra, sequela: Secondary | ICD-10-CM | POA: Diagnosis not present

## 2023-09-13 DIAGNOSIS — R2681 Unsteadiness on feet: Secondary | ICD-10-CM | POA: Diagnosis not present

## 2023-09-13 DIAGNOSIS — R29898 Other symptoms and signs involving the musculoskeletal system: Secondary | ICD-10-CM | POA: Diagnosis not present

## 2023-09-13 DIAGNOSIS — S069X9D Unspecified intracranial injury with loss of consciousness of unspecified duration, subsequent encounter: Secondary | ICD-10-CM | POA: Diagnosis not present

## 2023-09-13 DIAGNOSIS — S069X9S Unspecified intracranial injury with loss of consciousness of unspecified duration, sequela: Secondary | ICD-10-CM | POA: Diagnosis not present

## 2023-09-13 DIAGNOSIS — R29818 Other symptoms and signs involving the nervous system: Secondary | ICD-10-CM | POA: Diagnosis not present

## 2023-09-13 DIAGNOSIS — R208 Other disturbances of skin sensation: Secondary | ICD-10-CM

## 2023-09-13 DIAGNOSIS — M25512 Pain in left shoulder: Secondary | ICD-10-CM | POA: Diagnosis not present

## 2023-09-13 DIAGNOSIS — R2689 Other abnormalities of gait and mobility: Secondary | ICD-10-CM | POA: Diagnosis not present

## 2023-09-13 DIAGNOSIS — S069X0S Unspecified intracranial injury without loss of consciousness, sequela: Secondary | ICD-10-CM | POA: Diagnosis not present

## 2023-09-13 DIAGNOSIS — G5690 Unspecified mononeuropathy of unspecified upper limb: Secondary | ICD-10-CM | POA: Diagnosis not present

## 2023-09-13 MED ORDER — TIZANIDINE HCL 4 MG PO TABS: 2.0000 mg | ORAL_TABLET | Freq: Every evening | ORAL | 0 refills | Status: DC | PRN

## 2023-09-13 MED ORDER — TRIAMTERENE-HCTZ 37.5-25 MG PO TABS: 1.0000 | ORAL_TABLET | Freq: Every day | ORAL | 0 refills | Status: DC
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
## 2023-09-13 NOTE — Patient Instructions (Addendum)
 Get xrays of your bilateral shoulders to evaluate for arthritis; based on results, I may refer you to orthopedics to get shoulder injections for local pain control.  Finish steroid dosepak and meloxicam. Use Tylenol 1000 mg up to 3 times daily and topical voltaren gel on your shoulders in the meantime.   For muscle tightness in your neck and shoulders, use heat packs and take tizanidine 1/2 to 1 tab at nighttime as needed. Stop robaxin.   Work on getting into PT once neurosurgery gives restrictions. Massage may also be helpful.  Follow up with me in 2 months or sooner if needed.
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
## 2023-09-14 ENCOUNTER — Other Ambulatory Visit: Payer: Self-pay | Admitting: Student

## 2023-09-14 DIAGNOSIS — I10 Essential (primary) hypertension: Secondary | ICD-10-CM

## 2023-09-14 MED ORDER — AMLODIPINE BESYLATE 10 MG PO TABS: 10.0000 mg | ORAL_TABLET | Freq: Every day | ORAL | 0 refills | Status: DC

## 2023-09-20 ENCOUNTER — Encounter: Payer: Medicare Other | Admitting: Occupational Therapy

## 2023-09-20 ENCOUNTER — Ambulatory Visit: Payer: Medicare Other | Admitting: Physical Therapy

## 2023-09-21 ENCOUNTER — Ambulatory Visit: Payer: Medicare Other | Attending: Physician Assistant | Admitting: Occupational Therapy

## 2023-09-21 DIAGNOSIS — R29898 Other symptoms and signs involving the musculoskeletal system: Secondary | ICD-10-CM | POA: Diagnosis not present

## 2023-09-21 DIAGNOSIS — M25612 Stiffness of left shoulder, not elsewhere classified: Secondary | ICD-10-CM | POA: Diagnosis not present

## 2023-09-21 DIAGNOSIS — M25611 Stiffness of right shoulder, not elsewhere classified: Secondary | ICD-10-CM | POA: Insufficient documentation

## 2023-09-21 DIAGNOSIS — M542 Cervicalgia: Secondary | ICD-10-CM | POA: Insufficient documentation

## 2023-09-21 DIAGNOSIS — M25512 Pain in left shoulder: Secondary | ICD-10-CM | POA: Diagnosis not present

## 2023-09-21 DIAGNOSIS — R29818 Other symptoms and signs involving the nervous system: Secondary | ICD-10-CM | POA: Insufficient documentation

## 2023-09-21 DIAGNOSIS — R208 Other disturbances of skin sensation: Secondary | ICD-10-CM | POA: Diagnosis not present

## 2023-09-21 DIAGNOSIS — M6281 Muscle weakness (generalized): Secondary | ICD-10-CM | POA: Diagnosis not present

## 2023-09-21 DIAGNOSIS — R278 Other lack of coordination: Secondary | ICD-10-CM | POA: Diagnosis not present

## 2023-09-21 DIAGNOSIS — M25511 Pain in right shoulder: Secondary | ICD-10-CM | POA: Insufficient documentation
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
## 2023-09-22 ENCOUNTER — Encounter: Payer: Self-pay | Admitting: Physical Therapy

## 2023-09-22 ENCOUNTER — Ambulatory Visit: Payer: Medicare Other | Admitting: Physical Therapy

## 2023-09-22 VITALS — BP 157/96 | HR 78

## 2023-09-22 DIAGNOSIS — M6281 Muscle weakness (generalized): Secondary | ICD-10-CM

## 2023-09-22 DIAGNOSIS — R208 Other disturbances of skin sensation: Secondary | ICD-10-CM | POA: Diagnosis not present

## 2023-09-22 DIAGNOSIS — M542 Cervicalgia: Secondary | ICD-10-CM | POA: Diagnosis not present

## 2023-09-22 DIAGNOSIS — M25612 Stiffness of left shoulder, not elsewhere classified: Secondary | ICD-10-CM | POA: Diagnosis not present

## 2023-09-22 DIAGNOSIS — M25611 Stiffness of right shoulder, not elsewhere classified: Secondary | ICD-10-CM | POA: Diagnosis not present

## 2023-09-22 DIAGNOSIS — R29898 Other symptoms and signs involving the musculoskeletal system: Secondary | ICD-10-CM | POA: Diagnosis not present

## 2023-09-22 DIAGNOSIS — M25512 Pain in left shoulder: Secondary | ICD-10-CM | POA: Diagnosis not present

## 2023-09-22 DIAGNOSIS — M25511 Pain in right shoulder: Secondary | ICD-10-CM | POA: Diagnosis not present

## 2023-09-22 DIAGNOSIS — R278 Other lack of coordination: Secondary | ICD-10-CM | POA: Diagnosis not present

## 2023-09-22 DIAGNOSIS — R29818 Other symptoms and signs involving the nervous system: Secondary | ICD-10-CM | POA: Diagnosis not present
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
## 2023-09-26 DIAGNOSIS — M25512 Pain in left shoulder: Secondary | ICD-10-CM | POA: Diagnosis not present

## 2023-09-26 DIAGNOSIS — M25511 Pain in right shoulder: Secondary | ICD-10-CM | POA: Diagnosis not present

## 2023-09-27 ENCOUNTER — Other Ambulatory Visit: Payer: Self-pay | Admitting: Physical Medicine and Rehabilitation

## 2023-09-27 ENCOUNTER — Ambulatory Visit: Payer: Medicare Other | Admitting: Occupational Therapy

## 2023-09-27 ENCOUNTER — Ambulatory Visit: Payer: Medicare Other | Admitting: Physical Therapy

## 2023-09-27 ENCOUNTER — Other Ambulatory Visit: Payer: Self-pay | Admitting: Student

## 2023-09-27 ENCOUNTER — Other Ambulatory Visit (HOSPITAL_COMMUNITY): Payer: Self-pay

## 2023-09-27 ENCOUNTER — Encounter: Payer: Self-pay | Admitting: Physical Therapy

## 2023-09-27 VITALS — BP 150/94 | HR 73

## 2023-09-27 DIAGNOSIS — R29818 Other symptoms and signs involving the nervous system: Secondary | ICD-10-CM | POA: Diagnosis not present

## 2023-09-27 DIAGNOSIS — M6281 Muscle weakness (generalized): Secondary | ICD-10-CM

## 2023-09-27 DIAGNOSIS — R278 Other lack of coordination: Secondary | ICD-10-CM

## 2023-09-27 DIAGNOSIS — M25511 Pain in right shoulder: Secondary | ICD-10-CM | POA: Diagnosis not present

## 2023-09-27 DIAGNOSIS — R208 Other disturbances of skin sensation: Secondary | ICD-10-CM | POA: Diagnosis not present

## 2023-09-27 DIAGNOSIS — M25612 Stiffness of left shoulder, not elsewhere classified: Secondary | ICD-10-CM | POA: Diagnosis not present

## 2023-09-27 DIAGNOSIS — M542 Cervicalgia: Secondary | ICD-10-CM

## 2023-09-27 DIAGNOSIS — R29898 Other symptoms and signs involving the musculoskeletal system: Secondary | ICD-10-CM | POA: Diagnosis not present

## 2023-09-27 DIAGNOSIS — M25611 Stiffness of right shoulder, not elsewhere classified: Secondary | ICD-10-CM | POA: Diagnosis not present

## 2023-09-27 DIAGNOSIS — M25512 Pain in left shoulder: Secondary | ICD-10-CM | POA: Diagnosis not present

## 2023-09-27 MED ORDER — MELOXICAM 15 MG PO TABS: 15.0000 mg | ORAL_TABLET | Freq: Every day | ORAL | 0 refills | Status: DC

## 2023-09-27 MED ORDER — VITAMIN D3 50 MCG (2000 UT) PO TABS
2000.0000 [IU] | ORAL_TABLET | Freq: Every day | ORAL | 0 refills | Status: AC
  Filled 2023-09-27: qty 30, 30d supply, fill #0
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
## 2023-09-28 ENCOUNTER — Other Ambulatory Visit (HOSPITAL_COMMUNITY): Payer: Self-pay

## 2023-09-28 MED ORDER — ACETAMINOPHEN 325 MG PO TABS
325.0000 mg | ORAL_TABLET | Freq: Four times a day (QID) | ORAL | 0 refills | Status: DC | PRN
  Filled 2023-09-28: qty 100, 13d supply, fill #0

## 2023-09-28 NOTE — Telephone Encounter (Signed)
 It's over the counter, I have never needed to write a script for it at these doses. It is fine to continue.

## 2023-09-29 ENCOUNTER — Ambulatory Visit: Payer: Medicare Other | Admitting: Neurology

## 2023-10-04 ENCOUNTER — Ambulatory Visit: Payer: Medicare Other | Admitting: Physical Therapy

## 2023-10-04 ENCOUNTER — Other Ambulatory Visit: Payer: Self-pay | Admitting: Physical Medicine and Rehabilitation

## 2023-10-04 ENCOUNTER — Ambulatory Visit: Payer: Medicare Other | Admitting: Occupational Therapy

## 2023-10-04 ENCOUNTER — Other Ambulatory Visit: Payer: Self-pay | Admitting: Student

## 2023-10-04 DIAGNOSIS — M25512 Pain in left shoulder: Secondary | ICD-10-CM | POA: Diagnosis not present

## 2023-10-04 DIAGNOSIS — R278 Other lack of coordination: Secondary | ICD-10-CM

## 2023-10-04 DIAGNOSIS — M542 Cervicalgia: Secondary | ICD-10-CM | POA: Diagnosis not present

## 2023-10-04 DIAGNOSIS — R208 Other disturbances of skin sensation: Secondary | ICD-10-CM | POA: Diagnosis not present

## 2023-10-04 DIAGNOSIS — M25611 Stiffness of right shoulder, not elsewhere classified: Secondary | ICD-10-CM | POA: Diagnosis not present

## 2023-10-04 DIAGNOSIS — M6281 Muscle weakness (generalized): Secondary | ICD-10-CM

## 2023-10-04 DIAGNOSIS — M25511 Pain in right shoulder: Secondary | ICD-10-CM

## 2023-10-04 DIAGNOSIS — M25612 Stiffness of left shoulder, not elsewhere classified: Secondary | ICD-10-CM

## 2023-10-04 DIAGNOSIS — R29898 Other symptoms and signs involving the musculoskeletal system: Secondary | ICD-10-CM

## 2023-10-04 DIAGNOSIS — R29818 Other symptoms and signs involving the nervous system: Secondary | ICD-10-CM

## 2023-10-04 DIAGNOSIS — I10 Essential (primary) hypertension: Secondary | ICD-10-CM

## 2023-10-04 MED ORDER — TIZANIDINE HCL 4 MG PO TABS: 2.0000 mg | ORAL_TABLET | Freq: Every evening | ORAL | 0 refills | Status: DC | PRN

## 2023-10-04 MED ORDER — AMLODIPINE BESYLATE 10 MG PO TABS: 10.0000 mg | ORAL_TABLET | Freq: Every day | ORAL | 0 refills | Status: DC
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
## 2023-10-05 MED ORDER — MELOXICAM 15 MG PO TABS: 15.0000 mg | ORAL_TABLET | Freq: Every day | ORAL | 2 refills | Status: AC

## 2023-10-05 MED ORDER — TRIAMTERENE-HCTZ 37.5-25 MG PO TABS: 1.0000 | ORAL_TABLET | Freq: Every day | ORAL | 0 refills | Status: DC

## 2023-10-05 NOTE — Telephone Encounter (Signed)
 I called pt's daughter about scheduling her father an appt. Pt's daughter states he was just here in the clinic on 08/25/2023, he is not due not until May for his 3 month fu visit.

## 2023-10-09 ENCOUNTER — Other Ambulatory Visit (HOSPITAL_COMMUNITY): Payer: Self-pay

## 2023-10-10 ENCOUNTER — Other Ambulatory Visit (HOSPITAL_COMMUNITY): Payer: Self-pay

## 2023-10-11 ENCOUNTER — Ambulatory Visit: Payer: Medicare Other | Admitting: Physical Therapy

## 2023-10-11 ENCOUNTER — Ambulatory Visit: Payer: Medicare Other | Admitting: Occupational Therapy

## 2023-10-11 DIAGNOSIS — R208 Other disturbances of skin sensation: Secondary | ICD-10-CM

## 2023-10-11 DIAGNOSIS — R29898 Other symptoms and signs involving the musculoskeletal system: Secondary | ICD-10-CM | POA: Diagnosis not present

## 2023-10-11 DIAGNOSIS — M6281 Muscle weakness (generalized): Secondary | ICD-10-CM | POA: Diagnosis not present

## 2023-10-11 DIAGNOSIS — M25611 Stiffness of right shoulder, not elsewhere classified: Secondary | ICD-10-CM

## 2023-10-11 DIAGNOSIS — M25612 Stiffness of left shoulder, not elsewhere classified: Secondary | ICD-10-CM | POA: Diagnosis not present

## 2023-10-11 DIAGNOSIS — M25512 Pain in left shoulder: Secondary | ICD-10-CM | POA: Diagnosis not present

## 2023-10-11 DIAGNOSIS — M542 Cervicalgia: Secondary | ICD-10-CM | POA: Diagnosis not present

## 2023-10-11 DIAGNOSIS — M25511 Pain in right shoulder: Secondary | ICD-10-CM | POA: Diagnosis not present

## 2023-10-11 DIAGNOSIS — R278 Other lack of coordination: Secondary | ICD-10-CM | POA: Diagnosis not present

## 2023-10-11 DIAGNOSIS — R29818 Other symptoms and signs involving the nervous system: Secondary | ICD-10-CM | POA: Diagnosis not present
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
## 2023-10-11 NOTE — Patient Instructions (Signed)
 Suggestions for Handwriting Changes Handwriting can become small and cramped, and can become more difficult to control when writing for longer periods of time.  This handwriting change is called micrographia.  Why does micrographia occur?  Different neurological/musculoskeletal issues can cause slowing of movement, and feelings of muscle stiffness in the hands and fingers.  Loss of automatic motion also affects the easy, flowing motion of handwriting.  This can impact even simple writing tasks such as signing your name or writing a shopping list.  Attempts to write quickly without thinking about forming each letter contributes to small, cramped handwriting, and may cause the hand to develop a feeling of tightness.  How can I make writing easier?  Make a deliberate effort to form each letter.  This can be hard to do at first, but is very effective in improving size and legibility of handwriting.  Use a pen grip (round or triangular shaped rubber or foam cylinders available at stationery stores or where writing materials are found) or a larger size pen to keep your hand more relaxed.  Try printing rather than writing in a cursive style. Printing causes you to pause briefly between each letter, keeping writing more legible.   Using lined paper may provide a "visual target" to keep all letters big when writing.   A ballpoint pen typically works better than felt tip or "rolling writer"/gel styles.  Rest your hand if it begins to feel "tight".  Pause briefly when you see your handwriting becoming smaller.  Avoid hurrying or trying to write long passages if you are feeling stressed or fatigued.  Practice helps!  Remind yourself to slow down, aim big, and pause often!  Perform "flicks"/PWR! Hands if your hand feels tight, your writing gets smaller, before you start writing, or if tremors increase.  Involving your team: An occupational therapist can provide assessment and individual  recommendations for improvement of your handwriting.  This handout was adapted from [email or web address] [employer]

## 2023-10-16 ENCOUNTER — Telehealth: Payer: Self-pay | Admitting: *Deleted

## 2023-10-16 NOTE — Telephone Encounter (Signed)
 Discussed worsening neuropathic/cervicalgia pain of bilateral shoulders today by phone. See office note from early February. States his pain is not relived by tylenol, voltaren, muscle relaxer, current gabapentin dose (although gabapentin helped at first). He wonders if percocet would help. I feel she should be evaluated in office if that severe so will attempt to get him an appointment this week. In meantime, he will increase his gabapentin from 300 TID to 600 TID. We will also try to get his imaging done sooner.

## 2023-10-16 NOTE — Telephone Encounter (Signed)
 Call from patient's daughter asking about getting pain medication for patient.  States patient is in a lot of pain.  Has a CT/MRI scheduled was asking if the Clinics could get it scheduled sooner for patient.  Requesting a call back to discuss patient's pain.

## 2023-10-17 ENCOUNTER — Ambulatory Visit (INDEPENDENT_AMBULATORY_CARE_PROVIDER_SITE_OTHER): Admitting: Internal Medicine

## 2023-10-17 VITALS — BP 128/89 | HR 69 | Temp 97.6°F | Ht 68.5 in | Wt 162.9 lb

## 2023-10-17 DIAGNOSIS — M25512 Pain in left shoulder: Secondary | ICD-10-CM | POA: Diagnosis not present

## 2023-10-17 DIAGNOSIS — M25511 Pain in right shoulder: Secondary | ICD-10-CM | POA: Diagnosis not present

## 2023-10-17 DIAGNOSIS — G8929 Other chronic pain: Secondary | ICD-10-CM | POA: Diagnosis not present

## 2023-10-17 MED ORDER — OXYCODONE-ACETAMINOPHEN 5-325 MG PO TABS: 1.0000 | ORAL_TABLET | Freq: Two times a day (BID) | ORAL | 0 refills | Status: DC | PRN
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
## 2023-10-17 NOTE — Patient Instructions (Addendum)
 Thank you, [name] for allowing [name] to provide your care today.   I do not think that we can get a closer date for MRI. I am glad to hear that you have it scheduled so soon!  Pain medications I have sent in 30 tablets of Percocet 5 mg acetaminophen 375 mg.  Please take this medication at night before going to bed.  You can safely take up to 4000 mg of Tylenol (acetaminophen) daily.  If you are taking Percocet that they be sure you are adjusting your Tylenol dose to be safe.  I recommend stopping muscle relaxers with tizanidine and Robaxin as your body can get use to this medication and it be ineffective.  Continue using Voltaren gel and going to physical therapy.  Follow-up in 1 month and we can talk through additional prescriptions for Percocet.  Medication can cause constipation.  If this happens please try MiraLAX or senna.  I have ordered the following medication/changed the following medications:   Stop the following medications: Medications Discontinued During This Encounter  Medication Reason   acetaminophen (TYLENOL) 325 MG tablet      Start the following medications: Meds ordered this encounter  Medications   oxyCODONE-acetaminophen (PERCOCET) 5-325 MG tablet    Sig: Take 1 tablet by mouth every 12 (twelve) hours as needed for severe pain (pain score 7-10).    Dispense:  30 tablet    Refill:  0     Follow up: 1 month  We look forward to seeing you next time. Please call our clinic at [phone] if you have any questions or concerns. The best time to call is Monday-Friday from 9am-4pm, but there is someone available 24/7. If after hours or the weekend, call the main hospital number and ask for the Internal Medicine Resident On-Call. If you need medication refills, please notify your pharmacy one week in advance and they will send [name] a request.   Thank you for trusting me with your care. Wishing you the best!   Rudene Christians, DO [facility]

## 2023-10-18 ENCOUNTER — Encounter: Admitting: Occupational Therapy

## 2023-10-18 ENCOUNTER — Telehealth: Payer: Self-pay | Admitting: *Deleted

## 2023-10-18 ENCOUNTER — Ambulatory Visit: Payer: Self-pay

## 2023-10-18 ENCOUNTER — Ambulatory Visit: Attending: Physician Assistant | Admitting: Physical Therapy

## 2023-10-18 DIAGNOSIS — M25512 Pain in left shoulder: Secondary | ICD-10-CM | POA: Insufficient documentation

## 2023-10-18 DIAGNOSIS — M25612 Stiffness of left shoulder, not elsewhere classified: Secondary | ICD-10-CM | POA: Diagnosis not present

## 2023-10-18 DIAGNOSIS — R2681 Unsteadiness on feet: Secondary | ICD-10-CM | POA: Diagnosis not present

## 2023-10-18 DIAGNOSIS — M25611 Stiffness of right shoulder, not elsewhere classified: Secondary | ICD-10-CM | POA: Diagnosis not present

## 2023-10-18 DIAGNOSIS — M542 Cervicalgia: Secondary | ICD-10-CM | POA: Diagnosis not present

## 2023-10-18 DIAGNOSIS — M6281 Muscle weakness (generalized): Secondary | ICD-10-CM | POA: Diagnosis not present

## 2023-10-18 DIAGNOSIS — R2689 Other abnormalities of gait and mobility: Secondary | ICD-10-CM | POA: Diagnosis not present

## 2023-10-18 DIAGNOSIS — M25511 Pain in right shoulder: Secondary | ICD-10-CM | POA: Diagnosis not present
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
## 2023-10-18 NOTE — Telephone Encounter (Signed)
 Copied from CRM [id number]. Topic: Clinical - Prescription Issue >> Oct 18, 2023  4:17 PM Alona Bene A wrote: Reason for CRM: Patient called in regarding medication oxyCODONE-acetaminophen (PERCOCET) 5-325 MG tablet. Pharmacy did not provide patient with 30 tablets. Patient just spoke with provider. Patient is requesting immediate call back from Nurse or Provider to discuss medication refill. Patient is asking for his daughter to be contacted. Daughters name is Morrison Old, Solicitor number [phone].

## 2023-10-18 NOTE — Telephone Encounter (Signed)
 Chief Complaint: Shoulder Pain Symptoms: 10/10 pain Frequency: Ongoing, chronic  Pertinent Negatives: Patient denies fever, swelling, weakness, numbness Disposition: [] ED /[] Urgent Care (no appt availability in office) / [x] Appointment(In office/virtual)/ []  [facility]/ [] Home Care/ [] Refused Recommended Disposition /[] [facility] [place] [facility]/ []  Follow-up with PCP Additional Notes: Pt reports he was seen yesterday in office for bilateral shoulder pain, pt notes medications prescribed are not giving him any relief and pain is 10/10. Pt would like to know if he can take 2 of the Hydrocodone at a time as the 1 does not give him any relief. Pt's daughter notes they believed imaging was also to be ordered, she would like a call back so she and the pt can get some clarification on next steps. Routing to clinic for follow up. This RN educated pt on home care, new-worsening symptoms, when to call back/seek emergent care. Pt verbalized understanding and agrees to plan.    Copied from CRM [id number]. Topic: Clinical - Red Word Triage >> Oct 18, 2023 11:10 AM [name] wrote: Red Word that prompted transfer to Nurse Triage: Patient Shoulder pain level 10. Seen 04/01 Reason for Disposition  Shoulder pain is a chronic symptom (recurrent or ongoing AND present > 4 weeks)  Answer Assessment - Initial Assessment Questions 1. ONSET: "When did the pain start?"     Pt seen in office yesterday for pain 2. LOCATION: "Where is the pain located?"     Bilateral Shoulders 3. PAIN: "How bad is the pain?" (Scale 1-10; or mild, moderate, severe)   - MILD (1-3): doesn't interfere with normal activities   - MODERATE (4-7): interferes with normal activities (e.g., work or school) or awakens from sleep   - SEVERE (8-10): excruciating pain, unable to do any normal activities, unable to move arm at all due to pain     10/10 5. CAUSE: "What do you think is causing the shoulder pain?"     Chronic pain 6. OTHER  SYMPTOMS: "Do you have any other symptoms?" (e.g., neck pain, swelling, rash, fever, numbness, weakness)     None  Protocols used: Shoulder Pain-A-AH

## 2023-10-18 NOTE — Telephone Encounter (Signed)
 Sending pt's concerns to Dr Sloan Leiter who saw him yesterday.

## 2023-10-18 NOTE — Telephone Encounter (Signed)
 Call to patient upset that he was only given 10 tablets and not 30.Patient also spoke with Dr. Sloan Leiter as his pain was not being relieved with the 1 tablet and told that he could take 2 tablets instead.  Patient will be out before his MRI on 10/24/2023.  Call to Pharmacy since this is the first prescription for the medication the Insurance will treat it as Acute Pain and the patient will only receive 7 days worth. Another prescription will be needed for 7 days again at the new dosing for additional medication.  Call to patient explained why he only received the 7 days worth of the medication and that I will inform the doctor that a new prescription will need to be done at the new dosing for him.  Patient voiced understanding and will await new prescription.  Dr. Sloan Leiter was informed and will do another prescription for the patient at the new dosing if needed.
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
## 2023-10-19 NOTE — Progress Notes (Signed)
 [facility] Attending  Case discussed with the resident at the time of the visit.  We reviewed the resident's history and exam and pertinent patient test results.  I agree with the assessment, diagnosis, and plan of care documented in the resident's note.

## 2023-10-20 ENCOUNTER — Other Ambulatory Visit: Payer: Self-pay | Admitting: Internal Medicine

## 2023-10-20 ENCOUNTER — Telehealth: Payer: Self-pay

## 2023-10-20 DIAGNOSIS — G8929 Other chronic pain: Secondary | ICD-10-CM

## 2023-10-20 MED ORDER — OXYCODONE-ACETAMINOPHEN 10-325 MG PO TABS: 1.0000 | ORAL_TABLET | Freq: Two times a day (BID) | ORAL | 0 refills | Status: DC | PRN

## 2023-10-20 NOTE — Telephone Encounter (Addendum)
 Attn: Nurse Herbin  Pt. [name] and his daughter is calling back on the status of his medication: oxyCODONE. Due to having to take two tablets he has two left. Please advise

## 2023-10-20 NOTE — Telephone Encounter (Signed)
 Prior Authorization for patient (oxyCODONE-Acetaminophen 10-325MG  tablets) came through on cover my meds was submitted with last office notes awaiting approval or denial.  KEY: [id number]

## 2023-10-23 NOTE — Telephone Encounter (Signed)
 [name] (Key: [id number]) PA Case ID #: [id number] Need Help? Call us at [phone] Outcome N/A on April 4 by [employer] 2017 NCPDP This medication or product is on your plan's list of covered drugs. Prior authorization is not required at this time. If your pharmacy has questions regarding the processing of your prescription, please have them call the [employer] pharmacy help desk at [phone]. **Please note: This request was submitted electronically. Formulary lowering, tiering exception, cost reduction and/or pre-benefit determination review (including prospective Medicare hospice reviews) requests cannot be requested using this method of submission. Providers contact us at [phone] for further assistance. Drug oxyCODONE-Acetaminophen 10-325MG  tablets ePA cloud logo Form [employer] Part D Electronic Prior Authorization Form [phone] NCPDP)

## 2023-10-24 DIAGNOSIS — M25511 Pain in right shoulder: Secondary | ICD-10-CM | POA: Diagnosis not present

## 2023-10-24 DIAGNOSIS — M25512 Pain in left shoulder: Secondary | ICD-10-CM | POA: Diagnosis not present

## 2023-10-25 ENCOUNTER — Encounter: Admitting: Occupational Therapy

## 2023-10-25 ENCOUNTER — Encounter: Payer: Self-pay | Admitting: Physical Therapy

## 2023-10-25 ENCOUNTER — Ambulatory Visit: Admitting: Physical Therapy

## 2023-10-25 VITALS — BP 160/93 | HR 73

## 2023-10-25 DIAGNOSIS — M25512 Pain in left shoulder: Secondary | ICD-10-CM | POA: Diagnosis not present

## 2023-10-25 DIAGNOSIS — M542 Cervicalgia: Secondary | ICD-10-CM | POA: Diagnosis not present

## 2023-10-25 DIAGNOSIS — R2681 Unsteadiness on feet: Secondary | ICD-10-CM | POA: Diagnosis not present

## 2023-10-25 DIAGNOSIS — M25612 Stiffness of left shoulder, not elsewhere classified: Secondary | ICD-10-CM | POA: Diagnosis not present

## 2023-10-25 DIAGNOSIS — M25511 Pain in right shoulder: Secondary | ICD-10-CM | POA: Diagnosis not present

## 2023-10-25 DIAGNOSIS — R2689 Other abnormalities of gait and mobility: Secondary | ICD-10-CM | POA: Diagnosis not present

## 2023-10-25 DIAGNOSIS — M25611 Stiffness of right shoulder, not elsewhere classified: Secondary | ICD-10-CM | POA: Diagnosis not present

## 2023-10-25 DIAGNOSIS — M6281 Muscle weakness (generalized): Secondary | ICD-10-CM
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
## 2023-11-01 ENCOUNTER — Other Ambulatory Visit: Payer: Self-pay | Admitting: Student

## 2023-11-01 ENCOUNTER — Ambulatory Visit: Admitting: Physical Therapy

## 2023-11-01 ENCOUNTER — Encounter: Admitting: Occupational Therapy

## 2023-11-01 DIAGNOSIS — M25511 Pain in right shoulder: Secondary | ICD-10-CM

## 2023-11-01 DIAGNOSIS — M542 Cervicalgia: Secondary | ICD-10-CM | POA: Diagnosis not present

## 2023-11-01 DIAGNOSIS — M25512 Pain in left shoulder: Secondary | ICD-10-CM | POA: Diagnosis not present

## 2023-11-01 DIAGNOSIS — M6281 Muscle weakness (generalized): Secondary | ICD-10-CM | POA: Diagnosis not present

## 2023-11-01 DIAGNOSIS — M25612 Stiffness of left shoulder, not elsewhere classified: Secondary | ICD-10-CM

## 2023-11-01 DIAGNOSIS — G8929 Other chronic pain: Secondary | ICD-10-CM

## 2023-11-01 DIAGNOSIS — R2681 Unsteadiness on feet: Secondary | ICD-10-CM | POA: Diagnosis not present

## 2023-11-01 DIAGNOSIS — R2689 Other abnormalities of gait and mobility: Secondary | ICD-10-CM | POA: Diagnosis not present

## 2023-11-01 DIAGNOSIS — M25611 Stiffness of right shoulder, not elsewhere classified: Secondary | ICD-10-CM | POA: Diagnosis not present

## 2023-11-01 NOTE — Telephone Encounter (Signed)
 Last rx written  10/20/23 for 20 tabs. Last OV  10/17/23.
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
## 2023-11-01 NOTE — Telephone Encounter (Signed)
 Copied from CRM [id number]. Topic: Clinical - Medication Refill >> Nov 01, 2023  9:19 AM Hamdi H wrote: Most Recent Primary Care Visit:  Provider: Karalee Oscar  Department: [facility]  Visit Type: OPEN ESTABLISHED  Date: 10/17/2023  Medication: oxyCODONE-acetaminophen (PERCOCET) 10-325 MG tablet  Has the patient contacted their pharmacy? No (Agent: If no, request that the patient contact the pharmacy for the refill. If patient does not wish to contact the pharmacy document the reason why and proceed with request.) (Agent: If yes, when and what did the pharmacy advise?) Controlled substance   Is this the correct pharmacy for this prescription? Yes If no, delete pharmacy and type the correct one.  This is the patient's preferred pharmacy:  [id number] - [place] - [address] [place] ([place]) [place] [address] Phone: [phone] Fax: [phone]   Has the prescription been filled recently? Yes  Is the patient out of the medication? No  Has the patient been seen for an appointment in the last year OR does the patient have an upcoming appointment? Yes  Can we respond through MyChart? No, prefers phone call [phone] [name] daughter.   Agent: Please be advised that Rx refills may take up to 3 business days. We ask that you follow-up with your pharmacy.

## 2023-11-02 ENCOUNTER — Other Ambulatory Visit: Payer: Self-pay | Admitting: Student

## 2023-11-02 DIAGNOSIS — G8929 Other chronic pain: Secondary | ICD-10-CM

## 2023-11-02 NOTE — Telephone Encounter (Signed)
 Copied from CRM [id number]. Topic: Clinical - Medication Question >> Nov 02, 2023 11:50 AM Lenon Radar A wrote: Reason for CRM: Patients daughter called in to confirm if refill request was processed for oxyCODONE-acetaminophen (PERCOCET) 10-325 MG tablet . Daughter is asking for call from nurse to discuss refill request. Please contact her at [phone]. Daughter was advised of refill timeframe and that the order is pending. She still wants call from nurse to discuss.

## 2023-11-02 NOTE — Telephone Encounter (Signed)
 Refill request was denied; please call pt/daughter.

## 2023-11-03 ENCOUNTER — Telehealth: Payer: Self-pay

## 2023-11-03 NOTE — Telephone Encounter (Signed)
 Did not get response form On call provider from previous page. Called Dr Hayes Lipps again and entered office number.

## 2023-11-03 NOTE — Telephone Encounter (Signed)
 Daughter [name] and pt called asking why pain med oxycodone -acetaminophen  was denied. No reason was provided in chart. Advised to go to UC and daughter and pt became angry and demanded to have something stronger than Tylenol  ordered for pt over the weekend. Advised daughter will notify [facility] doctor but no guarantee that the provider will order anything. Daughter asking for a call back for MD or NT with plan of care. Called [facility] operator and was provided the name of Dr Hayes Lipps at [phone]. Called that number andit asked for return number to be entered and no VM to LM.   Call placed in call back queue.

## 2023-11-03 NOTE — Telephone Encounter (Signed)
 Daughter Domenick and pt called asking why pain med oxycodone -acetaminophen  was denied. No reason was provided in chart. Advised to go to UC and daughter and pt became angry and demanded to have something stronger than Tylenol  ordered for pt over the weekend. Advised daughter will notify [facility] doctor but no guarantee that the provider will order anything. Daughter asking for a call back for MD or NT with plan of care. Called [facility] operator and was provided the name of Dr Jolaine at [phone]. Called that number andit asked for return number to be entered and no VM to LM.   SABRA

## 2023-11-03 NOTE — Telephone Encounter (Deleted)
 Daughter Betsy Brown and pt called asking why pain med oxycodone -acetaminophen  was denied. No reason was provided in chart. Advised to go to [facility] and daughter and pt became angry and demanded to have something stronger than Tylenol  ordered for pt over the weekend. Advised daughter will notify [facility] doctor but no guarantee that the provider will order anything. Daughter asking for a call back for MD or NT with plan of care. Called [facility] operator and was provided the name of Dr Hayes Lipps at [phone]. Called that number andit asked for return number to be entered and no VM to LM.   Call placed in call back queue.

## 2023-11-05 ENCOUNTER — Other Ambulatory Visit: Payer: Self-pay | Admitting: Student

## 2023-11-06 ENCOUNTER — Ambulatory Visit: Payer: Self-pay

## 2023-11-06 ENCOUNTER — Telehealth: Payer: Self-pay | Admitting: Internal Medicine

## 2023-11-06 DIAGNOSIS — G8929 Other chronic pain: Secondary | ICD-10-CM

## 2023-11-06 NOTE — Telephone Encounter (Signed)
 Fax received - notes /MRI result given to Dr Jannice Mends.

## 2023-11-06 NOTE — Telephone Encounter (Signed)
 Thank you, pt's daughter has sent several messages thru [facility].
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
## 2023-11-06 NOTE — Telephone Encounter (Signed)
 Copied from CRM [id number]. Topic: Medical Record Request - Records Request >> Nov 06, 2023  8:22 AM Shelby Dessert H wrote: Reason for CRM: Patients daughter called and wanted to get results from [facility] Address: [address], [place], [place] [address] Areas served [employer] 9 AM Phone: [phone]  Patient also wants to know what's going on with the patients pain medicine oxyCODONE -Acetaminophen , looked through the notes and no reason was put in the chart, patients daughter would like a callback at [phone] so she can get some help with this matter

## 2023-11-06 NOTE — Telephone Encounter (Signed)
 Reason for Disposition  [1] Caller has URGENT medicine question about med that PCP or specialist prescribed AND [2] triager unable to answer question    Percocet 10-325 mg  Answer Assessment - Initial Assessment Questions 1. NAME of MEDICINE: "What medicine(s) are you calling about?"     I returned the call to [employer].   Her father is waiting for a refill of the Percocet 10-325 mg for chronic neck and bilateral shoulder pain. 2. QUESTION: "What is your question?" (e.g., double dose of medicine, side effect)     Waiting on a refill of Percocet.    One of the doctors called this morning and spoke with my father but I don't know what was discussed.   I'm in line at the [facility] presently so wasn't in on their conversation.    Needing MRI results too.  [facility] is who ordered it.   I was wondering if we could get the results of that too.   My father is having a lot of pain in his neck and shoulders.   3. PRESCRIBER: "Who prescribed the medicine?" Reason: if prescribed by specialist, call should be referred to that group.     Dr. Karalee Oscar, DO is listed in the chart this morning but medication has not been reordered or any notes entered by her. 4. SYMPTOMS: "Do you have any symptoms?" If Yes, ask: "What symptoms are you having?"  "How bad are the symptoms (e.g., mild, moderate, severe)     Chronic neck and bilateral shoulder pain. 5. PREGNANCY:  "Is there any chance that you are pregnant?" "When was your last menstrual period?"     N/A  Protocols used: Medication Question Call-A-AH  Chief Complaint: I returned call to daughter, [name], On DPR,    Her father is needing a refill of the Percocet 10-325 mg for chronic bilateral shoulder and neck pain.     She is also asking for the results of a recent MRI that was done by [facility]. Symptoms: Her father is having a lot of pain in his neck and bilateral shoulders.   Frequency: Chronic.   He is out of his Percocet. Pertinent  Negatives: Patient denies N/A Disposition: [] ED /[] Urgent Care (no appt availability in office) / [] Appointment(In office/virtual)/ []  [place] [facility]/ [] Home Care/ [] Refused Recommended Disposition /[] [facility]/ [x]  Follow-up with PCP Additional Notes: Message sent to [facility].   Daughter [name] agreeable to someone calling her back.

## 2023-11-06 NOTE — Addendum Note (Signed)
 Addended by: Bishop Bullock on: 11/06/2023 04:47 PM   Modules accepted: Orders

## 2023-11-06 NOTE — Telephone Encounter (Signed)
 I called [facility] for office and MRI notes.  Stated they will fax notes. Stated MRI of shoulder was done.

## 2023-11-06 NOTE — Telephone Encounter (Signed)
 [name], pt's daughter, called about the pt. RN and agent unable to hear daughter on the line during a warm transfer. RN called daughter back and LVM with a callback number.  Copied from CRM [id number]. Topic: Clinical - Red Word Triage >> Nov 06, 2023  8:32 AM Danelle Dunning F wrote: Red Word that prompted transfer to Nurse Triage:   Extreme pain; neck and both shoulders

## 2023-11-06 NOTE — Telephone Encounter (Signed)
 I talked to pt's daughter, [name], who several questions about pt's MRI result,wanting to know the result and plan; stated no one is calling her back. Also questions about pt's pain. I talked to Dr Jannice Mends who is currently seeing pt but will call pt this afternoon;Pt and daughter informed.

## 2023-11-07 ENCOUNTER — Ambulatory Visit: Admitting: Internal Medicine

## 2023-11-07 ENCOUNTER — Encounter: Admitting: Student

## 2023-11-07 VITALS — BP 161/93 | HR 80 | Temp 97.8°F | Ht 68.5 in | Wt 159.3 lb

## 2023-11-07 DIAGNOSIS — M25512 Pain in left shoulder: Secondary | ICD-10-CM | POA: Diagnosis not present

## 2023-11-07 DIAGNOSIS — I10 Essential (primary) hypertension: Secondary | ICD-10-CM | POA: Diagnosis not present

## 2023-11-07 DIAGNOSIS — M25511 Pain in right shoulder: Secondary | ICD-10-CM

## 2023-11-07 DIAGNOSIS — G8929 Other chronic pain: Secondary | ICD-10-CM | POA: Diagnosis not present

## 2023-11-07 MED ORDER — POLYETHYLENE GLYCOL 3350 17 G PO PACK: 17.0000 g | PACK | Freq: Every day | ORAL | 0 refills | Status: AC | PRN

## 2023-11-07 MED ORDER — DULOXETINE HCL 30 MG PO CPEP: 30.0000 mg | ORAL_CAPSULE | Freq: Every day | ORAL | 2 refills | Status: DC

## 2023-11-07 MED ORDER — OXYCODONE-ACETAMINOPHEN 10-325 MG PO TABS: 1.0000 | ORAL_TABLET | Freq: Two times a day (BID) | ORAL | 0 refills | Status: AC | PRN

## 2023-11-07 NOTE — Patient Instructions (Signed)
 Thank you, Mr.Walker A Lusher for allowing us  to provide your care today.   Shoulder pain I encourage you to keep appointment with ortho on Friday but I am concerned that surgery will be not beneficial.  Please start duloxetine , which I am hopeful will help with pain. Continue gabapentin  and Physical therapy. For your pain, you have signed a pain contract with our clinic. THIS MEDICATION HAS RISKS. The contract is to keep you safe. Our goal is to have you on the lowest dose of pain medication that allows you to function. Please take miralax  if you have constipation.  Follow-up in 3-4 weeks and we will check in to see how you are doing.     I have ordered the following medication/changed the following medications:   Stop the following medications: Medications Discontinued During This Encounter  Medication Reason   oxyCODONE -acetaminophen  (PERCOCET) 10-325 MG tablet      Start the following medications: Meds ordered this encounter  Medications   DULoxetine  (CYMBALTA ) 30 MG capsule    Sig: Take 1 capsule (30 mg total) by mouth daily.    Dispense:  30 capsule    Refill:  2   oxyCODONE -acetaminophen  (PERCOCET) 10-325 MG tablet    Sig: Take 1 tablet by mouth every 12 (twelve) hours as needed for pain.    Dispense:  60 tablet    Refill:  0   polyethylene glycol (MIRALAX ) 17 g packet    Sig: Take 17 g by mouth daily as needed.    Dispense:  14 each    Refill:  0     Follow up: 3-4 weeks   We look forward to seeing you next time. Please call our clinic at [phone] if you have any questions or concerns. The best time to call is Monday-Friday from 9am-4pm, but there is someone available 24/7. If after hours or the weekend, call the main hospital number and ask for the Internal Medicine Resident On-Call. If you need medication refills, please notify your pharmacy one week in advance and they will send us  a request.   Thank you for trusting me with your care. Wishing you the best!    Karalee Oscar, DO [facility]
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
## 2023-11-08 ENCOUNTER — Ambulatory Visit: Payer: Self-pay | Admitting: Student

## 2023-11-08 ENCOUNTER — Telehealth: Admitting: Internal Medicine

## 2023-11-08 ENCOUNTER — Ambulatory Visit: Admitting: Student

## 2023-11-08 ENCOUNTER — Ambulatory Visit: Admitting: Physical Therapy

## 2023-11-08 ENCOUNTER — Encounter: Admitting: Occupational Therapy

## 2023-11-08 VITALS — BP 145/98 | HR 75

## 2023-11-08 VITALS — BP 145/92 | HR 72 | Temp 97.9°F | Wt 159.2 lb

## 2023-11-08 DIAGNOSIS — M25511 Pain in right shoulder: Secondary | ICD-10-CM | POA: Diagnosis not present

## 2023-11-08 DIAGNOSIS — M542 Cervicalgia: Secondary | ICD-10-CM

## 2023-11-08 DIAGNOSIS — M7021 Olecranon bursitis, right elbow: Secondary | ICD-10-CM

## 2023-11-08 DIAGNOSIS — M25612 Stiffness of left shoulder, not elsewhere classified: Secondary | ICD-10-CM

## 2023-11-08 DIAGNOSIS — M6281 Muscle weakness (generalized): Secondary | ICD-10-CM | POA: Diagnosis not present

## 2023-11-08 DIAGNOSIS — M25512 Pain in left shoulder: Secondary | ICD-10-CM | POA: Diagnosis not present

## 2023-11-08 DIAGNOSIS — M25611 Stiffness of right shoulder, not elsewhere classified: Secondary | ICD-10-CM

## 2023-11-08 DIAGNOSIS — R2681 Unsteadiness on feet: Secondary | ICD-10-CM | POA: Diagnosis not present

## 2023-11-08 DIAGNOSIS — R2689 Other abnormalities of gait and mobility: Secondary | ICD-10-CM | POA: Diagnosis not present

## 2023-11-08 NOTE — Progress Notes (Signed)
 Acute Office Visit  Subjective:     Patient ID: [name], male    DOB: 12-22-1955, 68 y.o.   MRN: [id number]  Chief Complaint  Patient presents with   Elbow Problem    C/o "bump/lump" right elbow. Pt was unaware of it, but his son noticed it yesterday-denies pain. Seen in [facility] on 04/22 for chronic bilateral shoulder pain and rx'd duloxetine  30 mg   Patient is a 69 y.o. with a past medical history stated below who presents today for a bump on his right elbow. He is accompanied by his daughter today. Please see problem based assessment and plan for additional details.     Review of Systems  Constitutional:  Negative for fever and malaise/fatigue.  Musculoskeletal:  Negative for joint pain.      Objective:    BP (!) 145/92 (BP Location: Left Arm, Patient Position: Sitting, Cuff Size: Normal)   Pulse 72   Temp 97.9 F (36.6 C) (Oral)   Wt 159 lb 3.2 oz (72.2 kg)   SpO2 96%   BMI 23.85 kg/m  BP Readings from Last 3 Encounters:  11/08/23 (!) 145/92  11/08/23 (!) 145/98  11/07/23 (!) 161/93   Wt Readings from Last 3 Encounters:  11/08/23 159 lb 3.2 oz (72.2 kg)  11/07/23 159 lb 4.8 oz (72.3 kg)  10/17/23 162 lb 14.4 oz (73.9 kg)   Physical Exam HENT:     Head: Normocephalic and atraumatic.  Musculoskeletal:     Comments: Large, soft bump on patient's right elbow. Mobile, non-erythematous, non-tender. No evidence of bleeding or discharge from the site. Does not limit extension or flexion of the elbow. No evidence of axillary or cervical lymphadenopathy.   Lymphadenopathy:     Cervical: No cervical adenopathy.  Skin:    General: Skin is warm.  Neurological:     Mental Status: He is alert. Mental status is at baseline.  Psychiatric:        Mood and Affect: Mood normal.    No results found for any visits on 11/08/23.     Assessment & Plan:   Problem List Items Addressed This Visit     Olecranon bursitis of right elbow - Primary   Patient is here due to bump  on his right elbow that his son noticed yesterday after the patient arrived home from his regular health appointment at [facility] for shoulder pain. The patient and his daughter would like to know if the bump is dangerous or could have been caused by the medications he is taking. Patient denies having similar bumps in other parts of the body. The patient does not recall when the bump first appeared, denies any fever, pain, redness, draining from the site, enlarged lymph nodes, or other associated systemic symptoms. Remembers working in the yard yesterday but denies any injury. On physical exam, bump is at the site of the olecranon and feels soft and well circumscribed. It is not tender to touch, there is no bleeding/discharge from the site, and it does not impede range of motion. No palpable axillary (bilateral) or cervical lymph nodes. More likely lipoma or olecranon bursitis on physical exam, less likely to be an epidermoid cyst, bony deformity, rheumatoid nodule, or calciphylaxis. Dr. Adriane Albe assisted in using an ultrasound machine to visualize the tissue, confirmed the presence of fluid collection. Discussed benign diagnosis of olecranon bursitis. Did not recommend draining at this time as it is asymptomatic. Reviewed ways to protect area, including a bursitis elbow pad, and  icing the area to help with inflammation. Patient does not recall repetitive motion or position but will be mindful moving forward so that current bursitis can resolve. Reviewed indications to return to clinic including if the area starts to drain, bleeds, becomes red and warm to the touch, becomes tender/painful, and/or he develops systemic symptoms such as fever, nausea, vomiting, etc. Patient and his daughter endorsed understanding.  Plan - Ice affected area as needed - Recommended wearing bursitis elbow pad - No further intervention recommended at this time, will monitor - Patient to return to clinic as needed for this concern        No orders of the defined types were placed in this encounter.  Patient seen with Dr. Adriane Albe.  No follow-ups on file.  Nicolai Labonte Arellano Zameza, MD PGY-1, [facility]

## 2023-11-08 NOTE — Assessment & Plan Note (Addendum)
 Patient is here due to bump on his right elbow that his son noticed yesterday after the patient arrived home from his regular health appointment at [facility] for shoulder pain. The patient and his daughter would like to know if the bump is dangerous or could have been caused by the medications he is taking. Patient denies having similar bumps in other parts of the body. The patient does not recall when the bump first appeared, denies any fever, pain, redness, draining from the site, enlarged lymph nodes, or other associated systemic symptoms. Remembers working in the yard yesterday but denies any injury. On physical exam, bump is at the site of the olecranon and feels soft and well circumscribed. It is not tender to touch, there is no bleeding/discharge from the site, and it does not impede range of motion. No palpable axillary (bilateral) or cervical lymph nodes. More likely lipoma or olecranon bursitis on physical exam, less likely to be an epidermoid cyst, bony deformity, rheumatoid nodule, or calciphylaxis. Dr. Adriane Albe assisted in using an ultrasound machine to visualize the tissue, confirmed the presence of fluid collection. Discussed benign diagnosis of olecranon bursitis. Did not recommend draining at this time as it is asymptomatic. Reviewed ways to protect area, including a bursitis elbow pad, and icing the area to help with inflammation. Patient does not recall repetitive motion or position but will be mindful moving forward so that current bursitis can resolve. Reviewed indications to return to clinic including if the area starts to drain, bleeds, becomes red and warm to the touch, becomes tender/painful, and/or he develops systemic symptoms such as fever, nausea, vomiting, etc. Patient and his daughter endorsed understanding.  Plan - Ice affected area as needed - Recommended wearing bursitis elbow pad - No further intervention recommended at this time, will monitor - Patient to return to clinic as needed  for this concern

## 2023-11-08 NOTE — Telephone Encounter (Signed)
 RTC to patient's daughter given an appointment for 2:45 PM tday. Is asking for an appointment while she is out with patient today.Spoke to Jada.Asking for latest appointment  today.  Transferred to front desk to schedule an appointment.
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
## 2023-11-08 NOTE — Telephone Encounter (Signed)
 Copied from CRM [id number]. Topic: Clinical - Red Word Triage >> Nov 08, 2023  8:46 AM Danelle Dunning F wrote: [facility] that prompted transfer to Nurse Triage:   Knot on right elbow; not painful; feels like it may have fluid; concerned it may be an allergic reaction to a new medication  Noticed yesterday evening   Chief Complaint: Knot on elbow Symptoms: Golf ball size knot on elbow Frequency: constant Pertinent Negatives: Patient denies pain or redness Disposition: [] ED /[] Urgent Care (no appt availability in office) / [] Appointment(In office/virtual)/ []  [place] [facility]/ [] Home Care/ [] Refused Recommended Disposition /[] [place] [facility]/ [x]  Follow-up with PCP Additional Notes: Pt reports golf-ball sized knot on right elbow. Denies pain or redness. Has PT appt today at 0945-10:15, wanting to know if he an be evaluated in office afterwards. Per daughter [name], they will be away from office. Trying to get seen between 10:30-11:45? Will route to office for follow-up and see if they can accommodate this morning.   Reason for Disposition  Fluid-filled sack located directly over tip (point) of elbow  Answer Assessment - Initial Assessment Questions 1. APPEARANCE of SWELLING: "What does it look like?"     On right elbow  2. SIZE: "How large is the swelling?" (e.g., inches, cm; or compare to size of pinhead, tip of pen, eraser, coin, pea, grape, ping pong ball)      "Noticeable, size  of golf ball  3. LOCATION: "Where is the swelling located?"     Right elbow  4. ONSET: "When did the swelling start?"     Unsure of start, but first noticed yesterday.   5. COLOR: "What color is it?" "Is there more than one color?"     No color change  6. PAIN: "Is there any pain?" If Yes, ask: "How bad is the pain?" (e.g., scale 1-10; or mild, moderate, severe)     - NONE (0): no pain   - MILD (1-3): doesn't interfere with normal activities    - MODERATE (4-7): interferes with normal  activities or awakens from sleep    - SEVERE (8-10): excruciating pain, unable to do any normal activities     No pain  7. ITCH: "Does it itch?" If Yes, ask: "How bad is the itch?"      No itching  8. CAUSE: "What do you think caused the swelling?" Unsure of cause, thinks it may be related to taking new medication (cymbalta )  9 OTHER SYMPTOMS: "Do you have any other symptoms?" (e.g., fever)     No  Answer Assessment - Initial Assessment Questions 1. LOCATION: "Where is the swelling?" (e.g., left, right, both elbows)     Right elbow  2. SIZE and DESCRIPTION: "What does the swelling look like?" (e.g., entire elbow, localized)     Entire elbow  3. ONSET: "When did the swelling start?" "Does it come and go, or is it there all the time?"    Unsure of when it started. Was noticed after appt yesterday  4. SETTING: "Has there been any recent work, exercise or other activity that involved that part of the body?"      No  5. AGGRAVATING FACTORS: "What makes the elbow swelling worse?" (e.g., work, sports activities)     No  6. ASSOCIATED SYMPTOMS: "Is there any pain or redness?"     No pain or redness  7. OTHER SYMPTOMS: "Do you have any other symptoms?" (e.g., fever)     No  8. PREGNANCY: "Is  there any chance you are pregnant?" "When was your last menstrual period?"     No  Protocols used: Skin Lump or Localized Swelling-A-AH, Elbow Swelling-A-AH
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
## 2023-11-08 NOTE — Assessment & Plan Note (Signed)
 Blood pressure elevated at 146/94 to 161/93. Follow-up at next visit.

## 2023-11-08 NOTE — Patient Instructions (Addendum)
 Thank you, Mr.Aseem A Maris for allowing us  to provide your care today. Today we discussed the bump on your right elbow, which we identified as elbow bursitis also known as olecranon bursitis. We were able to visualize it using ultrasound today.   I have ordered the following labs for you:  Lab Orders  No laboratory test(s) ordered today     Tests ordered today:  None  Referrals ordered today:   Referral Orders  No referral(s) requested today     I have ordered the following medication/changed the following medications:   Stop the following medications: There are no discontinued medications.   Start the following medications: No orders of the defined types were placed in this encounter.    Follow up: 3 months for regular follow up or sooner as needed   Remember:   - Olecranon (elbow) bursitis is a benign (not dangerous) condition that can happen from overuse/irritation of the elbow. We do not recommend draining the fluid, it will likely resolve on its own. Reasons to come back to clinic include redness/pain/fever/draining from the site.   - For symptom relief you can use an ice pack or elbow pad to help protect the area and keep it from getting irritated or injured:   "Bursitis elbow pad"   - Can be work during activity throughout the day, stop use if area becomes more inflamed and give us  a call.   Should you have any questions or concerns please call the internal medicine clinic at [phone].     Lavoy Bernards Arellano Zameza, MD PGY-1 Internal Medicine Teaching [facility]

## 2023-11-10 DIAGNOSIS — M25512 Pain in left shoulder: Secondary | ICD-10-CM | POA: Diagnosis not present

## 2023-11-10 DIAGNOSIS — M25511 Pain in right shoulder: Secondary | ICD-10-CM | POA: Diagnosis not present

## 2023-11-13 ENCOUNTER — Encounter: Payer: Medicare Other | Admitting: Physical Medicine and Rehabilitation

## 2023-11-15 ENCOUNTER — Encounter: Admitting: Occupational Therapy

## 2023-11-15 ENCOUNTER — Ambulatory Visit: Admitting: Physical Therapy

## 2023-11-15 ENCOUNTER — Encounter: Payer: Self-pay | Admitting: Physical Therapy

## 2023-11-15 DIAGNOSIS — M25512 Pain in left shoulder: Secondary | ICD-10-CM | POA: Diagnosis not present

## 2023-11-15 DIAGNOSIS — R2689 Other abnormalities of gait and mobility: Secondary | ICD-10-CM

## 2023-11-15 DIAGNOSIS — M25511 Pain in right shoulder: Secondary | ICD-10-CM | POA: Diagnosis not present

## 2023-11-15 DIAGNOSIS — R2681 Unsteadiness on feet: Secondary | ICD-10-CM

## 2023-11-15 DIAGNOSIS — M6281 Muscle weakness (generalized): Secondary | ICD-10-CM

## 2023-11-15 DIAGNOSIS — M542 Cervicalgia: Secondary | ICD-10-CM

## 2023-11-15 DIAGNOSIS — M25612 Stiffness of left shoulder, not elsewhere classified: Secondary | ICD-10-CM | POA: Diagnosis not present

## 2023-11-15 DIAGNOSIS — M25611 Stiffness of right shoulder, not elsewhere classified: Secondary | ICD-10-CM | POA: Diagnosis not present
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
## 2023-11-17 NOTE — Progress Notes (Signed)
 [facility] Attending  Case discussed with the resident at the time of the visit.  We reviewed the resident's history and exam and pertinent patient test results.  I agree with the assessment, diagnosis, and plan of care documented in the resident's note.

## 2023-11-17 NOTE — Progress Notes (Signed)
Internal Medicine Clinic Attending  I was physically present during the key portions of the resident provided service and participated in the medical decision making of patient's management care. I reviewed pertinent patient test results.  The assessment, diagnosis, and plan were formulated together and I agree with the documentation in the resident's note.  Gust Rung, DO

## 2023-11-19 NOTE — Progress Notes (Signed)
 Please call the patient and let him know that his shoulder x-rays looked good, and I do not think that injections are warranted at this time.  The bony abnormality that we saw on CT in his left shoulder was not present on the x-rays, so I am not sure how to explain that other than maybe artifact in the initial imaging, but I apologize for any confusion.  I can answer any questions he might have when I am back in office in a few weeks.  Thank you!

## 2023-11-21 DIAGNOSIS — S46011D Strain of muscle(s) and tendon(s) of the rotator cuff of right shoulder, subsequent encounter: Secondary | ICD-10-CM | POA: Diagnosis not present

## 2023-11-21 DIAGNOSIS — S46012D Strain of muscle(s) and tendon(s) of the rotator cuff of left shoulder, subsequent encounter: Secondary | ICD-10-CM | POA: Diagnosis not present

## 2023-11-22 ENCOUNTER — Ambulatory Visit: Admitting: Physical Therapy

## 2023-11-22 ENCOUNTER — Ambulatory Visit: Payer: Medicare Other | Admitting: Neurology

## 2023-11-22 ENCOUNTER — Encounter: Admitting: Occupational Therapy

## 2023-11-23 ENCOUNTER — Encounter: Payer: Self-pay | Admitting: *Deleted

## 2023-11-27 DIAGNOSIS — S46011D Strain of muscle(s) and tendon(s) of the rotator cuff of right shoulder, subsequent encounter: Secondary | ICD-10-CM | POA: Diagnosis not present

## 2023-11-27 DIAGNOSIS — S46012D Strain of muscle(s) and tendon(s) of the rotator cuff of left shoulder, subsequent encounter: Secondary | ICD-10-CM | POA: Diagnosis not present

## 2023-11-29 ENCOUNTER — Encounter: Admitting: Occupational Therapy

## 2023-11-29 ENCOUNTER — Ambulatory Visit: Admitting: Physical Therapy

## 2023-11-29 ENCOUNTER — Other Ambulatory Visit: Payer: Self-pay | Admitting: Internal Medicine

## 2023-11-29 NOTE — Telephone Encounter (Signed)
 Medication discontinued 08/31/23

## 2023-11-30 ENCOUNTER — Ambulatory Visit: Admitting: Neurology

## 2023-11-30 ENCOUNTER — Encounter: Payer: Self-pay | Admitting: Neurology

## 2023-11-30 ENCOUNTER — Telehealth: Payer: Self-pay | Admitting: Neurology

## 2023-11-30 VITALS — BP 145/90 | HR 64 | Ht 67.0 in | Wt 158.0 lb

## 2023-11-30 DIAGNOSIS — R561 Post traumatic seizures: Secondary | ICD-10-CM | POA: Diagnosis not present

## 2023-11-30 DIAGNOSIS — S069XAD Unspecified intracranial injury with loss of consciousness status unknown, subsequent encounter: Secondary | ICD-10-CM

## 2023-11-30 NOTE — Telephone Encounter (Signed)
 Call to patient, advised that Dr. Samara Crest doesn't prescribe that and to contact PCP. He states that he did that but Dr. Samara Crest stopped the medication. It was noted to be discontinued at 2/13 visit due to CMA being told he no longer takes. He doesn't remember that but advised we would route to PCP and have reviewed last visit notes and Dr. Samara Crest did not take him off of the medication. Advised patient to follow up with PCP tomorrow to make sure they got the message that was sent to them via epic. Patient in agreement
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
## 2023-11-30 NOTE — Telephone Encounter (Signed)
 Pt called stating that he forgot to mention to the provider today that he is needing a refill on his Cialis  (tadalafil ) and have it sent to the [facility] on [address]

## 2023-11-30 NOTE — Patient Instructions (Signed)
 Reduce Depakote to 250 mg daily for one week then discontinue the medication  Continue your other medications  Continue to follow up with your doctors  You might resume driving  Return as needed

## 2023-12-01 ENCOUNTER — Other Ambulatory Visit: Payer: Self-pay | Admitting: Student

## 2023-12-01 NOTE — Telephone Encounter (Signed)
 Copied from CRM [id number]. Topic: Clinical - Medication Question >> Dec 01, 2023 12:18 PM Adrianna P wrote: Reason for CRM: Patient states that he wants to be put back on Cialis  (tadalafil ) he would like a prescription

## 2023-12-04 DIAGNOSIS — S46012D Strain of muscle(s) and tendon(s) of the rotator cuff of left shoulder, subsequent encounter: Secondary | ICD-10-CM | POA: Diagnosis not present

## 2023-12-04 DIAGNOSIS — S46011D Strain of muscle(s) and tendon(s) of the rotator cuff of right shoulder, subsequent encounter: Secondary | ICD-10-CM | POA: Diagnosis not present

## 2023-12-05 ENCOUNTER — Other Ambulatory Visit: Payer: Self-pay | Admitting: Student

## 2023-12-06 ENCOUNTER — Other Ambulatory Visit: Payer: Self-pay | Admitting: *Deleted

## 2023-12-06 MED ORDER — TIZANIDINE HCL 4 MG PO TABS: 2.0000 mg | ORAL_TABLET | Freq: Every evening | ORAL | 0 refills | Status: DC | PRN

## 2023-12-06 NOTE — Telephone Encounter (Signed)
 Copied from CRM [id number]. Topic: Clinical - Medication Question >> Dec 06, 2023  8:14 AM Lenon Radar A wrote: Reason for CRM: Patient called in regarding medication tadalafil  (CIALIS ) 10 MG tablet. Patient stated he needs this medication and did not ask to be taken off. Message was sent 5/15 for update on weather pt should be on medication or not, there is no response. Please contact patient to advise if refill can be processed. Patient can be reached at [phone].

## 2023-12-07 ENCOUNTER — Other Ambulatory Visit: Payer: Self-pay | Admitting: Student

## 2023-12-07 MED ORDER — TADALAFIL 10 MG PO TABS: 10.0000 mg | ORAL_TABLET | Freq: Every day | ORAL | 3 refills | Status: DC | PRN

## 2023-12-07 NOTE — Telephone Encounter (Signed)
 Last Fill: Unk, Historical Provider  Last OV: 11/08/23 Next OV: None Scheduled  Routing to provider for review/authorization.

## 2023-12-07 NOTE — Telephone Encounter (Signed)
 Copied from CRM [id number]. Topic: Clinical - Medication Refill >> Dec 07, 2023  9:56 AM Carrielelia G wrote:  Medication: tadalafil  (CIALIS ) 10 MG table (patient stated he did not request for this medication to be stopped)   Has the patient contacted their pharmacy? No (Agent: If no, request that the patient contact the pharmacy for the refill. If patient does not wish to contact the pharmacy document the reason why and proceed with request.) (Agent: If yes, when and what did the pharmacy advise?)  This is the patient's preferred pharmacy:  [id number] - [place] ([place]), [place] - [address] [place] ([place]) [place] [address] Phone: [phone] Fax: [phone]   Is this the correct pharmacy for this prescription? Yes If no, delete pharmacy and type the correct one.   Has the prescription been filled recently? No  Is the patient out of the medication? No  Has the patient been seen for an appointment in the last year OR does the patient have an upcoming appointment? No  Can we respond through MyChart? no  Agent: Please be advised that Rx refills may take up to 3 business days. We ask that you follow-up with your pharmacy.

## 2023-12-12 DIAGNOSIS — S46012D Strain of muscle(s) and tendon(s) of the rotator cuff of left shoulder, subsequent encounter: Secondary | ICD-10-CM | POA: Diagnosis not present

## 2023-12-12 DIAGNOSIS — S46011D Strain of muscle(s) and tendon(s) of the rotator cuff of right shoulder, subsequent encounter: Secondary | ICD-10-CM | POA: Diagnosis not present

## 2023-12-19 DIAGNOSIS — S46011D Strain of muscle(s) and tendon(s) of the rotator cuff of right shoulder, subsequent encounter: Secondary | ICD-10-CM | POA: Diagnosis not present

## 2023-12-19 DIAGNOSIS — S46012D Strain of muscle(s) and tendon(s) of the rotator cuff of left shoulder, subsequent encounter: Secondary | ICD-10-CM | POA: Diagnosis not present

## 2023-12-25 DIAGNOSIS — S46011D Strain of muscle(s) and tendon(s) of the rotator cuff of right shoulder, subsequent encounter: Secondary | ICD-10-CM | POA: Diagnosis not present

## 2023-12-26 DIAGNOSIS — S46011D Strain of muscle(s) and tendon(s) of the rotator cuff of right shoulder, subsequent encounter: Secondary | ICD-10-CM | POA: Diagnosis not present

## 2023-12-26 DIAGNOSIS — S46012D Strain of muscle(s) and tendon(s) of the rotator cuff of left shoulder, subsequent encounter: Secondary | ICD-10-CM | POA: Diagnosis not present

## 2024-01-01 ENCOUNTER — Emergency Department (HOSPITAL_COMMUNITY)

## 2024-01-01 ENCOUNTER — Other Ambulatory Visit: Payer: Self-pay

## 2024-01-01 ENCOUNTER — Encounter (HOSPITAL_COMMUNITY): Payer: Self-pay

## 2024-01-01 ENCOUNTER — Emergency Department (HOSPITAL_COMMUNITY)
Admission: EM | Admit: 2024-01-01 | Discharge: 2024-01-01 | Disposition: A | Discharge status: Home / Self Care | Attending: Emergency Medicine | Admitting: Emergency Medicine

## 2024-01-01 ENCOUNTER — Ambulatory Visit (HOSPITAL_COMMUNITY)
Admission: EM | Admit: 2024-01-01 | Discharge: 2024-01-01 | Disposition: A | Discharge status: Home / Self Care | Attending: Emergency Medicine | Admitting: Emergency Medicine

## 2024-01-01 ENCOUNTER — Encounter (HOSPITAL_COMMUNITY): Payer: Self-pay | Admitting: *Deleted

## 2024-01-01 ENCOUNTER — Ambulatory Visit: Payer: Self-pay

## 2024-01-01 DIAGNOSIS — I10 Essential (primary) hypertension: Secondary | ICD-10-CM | POA: Diagnosis not present

## 2024-01-01 DIAGNOSIS — R140 Abdominal distension (gaseous): Secondary | ICD-10-CM

## 2024-01-01 DIAGNOSIS — R1030 Lower abdominal pain, unspecified: Secondary | ICD-10-CM | POA: Diagnosis not present

## 2024-01-01 DIAGNOSIS — K449 Diaphragmatic hernia without obstruction or gangrene: Secondary | ICD-10-CM | POA: Diagnosis not present

## 2024-01-01 DIAGNOSIS — R1033 Periumbilical pain: Secondary | ICD-10-CM | POA: Diagnosis not present

## 2024-01-01 DIAGNOSIS — K769 Liver disease, unspecified: Secondary | ICD-10-CM | POA: Diagnosis not present

## 2024-01-01 DIAGNOSIS — K429 Umbilical hernia without obstruction or gangrene: Secondary | ICD-10-CM | POA: Diagnosis not present

## 2024-01-01 LAB — URINALYSIS, ROUTINE W REFLEX MICROSCOPIC
Bilirubin Urine: NEGATIVE
Glucose, UA: NEGATIVE mg/dL
Hgb urine dipstick: NEGATIVE
Ketones, ur: NEGATIVE mg/dL
Leukocytes,Ua: NEGATIVE
Nitrite: NEGATIVE
Protein, ur: NEGATIVE mg/dL
Specific Gravity, Urine: 1.015 (ref 1.005–1.030)
pH: 8 (ref 5.0–8.0)

## 2024-01-01 LAB — POCT URINALYSIS DIP (MANUAL ENTRY)
Blood, UA: NEGATIVE
Glucose, UA: NEGATIVE mg/dL
Leukocytes, UA: NEGATIVE
Nitrite, UA: NEGATIVE
Protein Ur, POC: NEGATIVE mg/dL
Spec Grav, UA: 1.02 (ref 1.010–1.025)
Urobilinogen, UA: 1 U/dL
pH, UA: 6.5 (ref 5.0–8.0)

## 2024-01-01 LAB — COMPREHENSIVE METABOLIC PANEL WITH GFR
ALT: 24 U/L (ref 0–44)
AST: 17 U/L (ref 15–41)
Albumin: 4 g/dL (ref 3.5–5.0)
Alkaline Phosphatase: 47 U/L (ref 38–126)
Anion gap: 7 (ref 5–15)
BUN: 14 mg/dL (ref 8–23)
CO2: 28 mmol/L (ref 22–32)
Calcium: 9.2 mg/dL (ref 8.9–10.3)
Chloride: 103 mmol/L (ref 98–111)
Creatinine, Ser: 0.83 mg/dL (ref 0.61–1.24)
GFR, Estimated: >60 mL/min (ref >60)
Glucose, Bld: 98 mg/dL (ref 70–99)
Potassium: 4 mmol/L (ref 3.5–5.1)
Sodium: 138 mmol/L (ref 135–145)
Total Bilirubin: 0.7 mg/dL (ref 0.0–1.2)
Total Protein: 6.8 g/dL (ref 6.5–8.1)

## 2024-01-01 LAB — CBC
HCT: 47 % (ref 39.0–52.0)
Hemoglobin: 15.4 g/dL (ref 13.0–17.0)
MCH: 29.6 pg (ref 26.0–34.0)
MCHC: 32.8 g/dL (ref 30.0–36.0)
MCV: 90.2 fL (ref 80.0–100.0)
Platelets: 284 10*3/uL (ref 150–400)
RBC: 5.21 MIL/uL (ref 4.22–5.81)
RDW: 12.9 % (ref 11.5–15.5)
WBC: 4.4 10*3/uL (ref 4.0–10.5)
nRBC: 0 % (ref 0.0–0.2)

## 2024-01-01 LAB — LIPASE, BLOOD: Lipase: 32 U/L (ref 11–51)

## 2024-01-01 MED ORDER — ACETAMINOPHEN 500 MG PO TABS
1000.0000 mg | ORAL_TABLET | Freq: Once | ORAL | Status: AC
  Administered 2024-01-01: 1000 mg via ORAL
  Filled 2024-01-01: qty 2

## 2024-01-01 MED ORDER — IOHEXOL 350 MG/ML SOLN: 75.0000 mL | Freq: Once | INTRAVENOUS | Status: AC | PRN

## 2024-01-01 MED ORDER — DICYCLOMINE HCL 20 MG PO TABS: 20.0000 mg | ORAL_TABLET | Freq: Two times a day (BID) | ORAL | 0 refills | Status: DC

## 2024-01-01 NOTE — ED Triage Notes (Signed)
 C/O constant perioumbilical pain onset approx 1 wk. Today also c/o back pain since waking up. Denies any n/v/d or fevers. STates normally has regular daily BMs, but last wk had 2 days without a BM, so he took a stool softener twice. States last BM this AM and was normal. States has also tried some old remedies like drinking buttermilk.

## 2024-01-01 NOTE — Telephone Encounter (Signed)
 Call to patient to schedule him with an appointment in the Clinics if he continues to have the abdominal pains.  Message was left that the Clinics had called and to call for an appointment for his abdominal pain.

## 2024-01-01 NOTE — ED Provider Triage Note (Signed)
 Emergency Medicine Provider Triage Evaluation Note  [name] , a 68 y.o. male  was evaluated in triage.  Pt complains of lower abd pain, some back pain (lower lumbar, Bilateral).  Review of Systems  Positive: Abd pain, low back pain Negative: Urinary Sx, constipation, blood in stool, N/V/D, fever, chills, abd Sx  Physical Exam  BP (!) 146/90 (BP Location: Right Arm)   Pulse 60   Temp 97.8 F (36.6 C) (Oral)   Resp (!) 21   Ht 5' 7 (1.702 m)   Wt 72.6 kg   SpO2 100%   BMI 25.06 kg/m  Gen:   Awake, no distress   Resp:  Normal effort  MSK:   Moves extremities without difficulty  Other:  Minimal tenderness to suprapelvic region  Medical Decision Making  Medically screening exam initiated at 12:41 PM.  Appropriate orders placed.  [name] was informed that the remainder of the evaluation will be completed by another provider, this initial triage assessment does not replace that evaluation, and the importance of remaining in the ED until their evaluation is complete.  Labs and imaging ordered   Merryl Abraham 01/01/24 1243

## 2024-01-01 NOTE — Discharge Instructions (Signed)
Please go to the emergency department for further evaluation of your abdominal pain.

## 2024-01-01 NOTE — ED Notes (Signed)
 Patient is being discharged from the Urgent Care and sent to the Emergency Department via private vehicle . Per C. Rosevelt Constable, NP, patient is in need of higher level of care due to perimumbilical abd pain. Patient is aware and verbalizes understanding of plan of care.  Vitals:   01/01/24 0959  BP: (!) 147/87  Pulse: 62  Resp: 16  Temp: 97.8 F (36.6 C)  SpO2: 95%

## 2024-01-01 NOTE — Discharge Instructions (Signed)
 Please take Bentyl 2 times a day as needed for lower abdominal cramping.  You may also continue stool softeners as needed.  Recommend making a log of when abdominal pain occurs to see if it correlates with anything specific.  Please follow-up with your PCP in the next few days Thank you for allowing us  to take care of you today.  We hope you begin feeling better soon.   To-Do:  Please follow-up with your primary doctor within the next 2-3 days. Please return to the Emergency Department or call 911 if you experience chest pain, shortness of breath, severe pain, severe fever, altered mental status, or have any reason to think that you need emergency medical care.  Thank you again.  Hope you feel better soon.  [facility] Department of Emergency Medicine

## 2024-01-01 NOTE — Telephone Encounter (Signed)
 FYI Only or Action Required?: FYI only for provider  Patient was last seen in primary care on 11/08/2023 by Christopher Zameza, Priscila, MD. Called Nurse Triage reporting Abdominal Pain. Symptoms began several days ago. Interventions attempted: OTC medications: Tylenol , stool softener and Other: drinking buttermilk. Symptoms are: intermittent abdominal pain, decreased appetite, lower back pain, irregular stoolsgradually worsening.  Triage Disposition: See Physician Within 24 Hours  Patient/caregiver understands and will follow disposition?: Yes              Copied from CRM [id number]. Topic: Clinical - Red Word Triage >> Jan 01, 2024  8:13 AM [name] wrote: Red Word that prompted transfer to Nurse Triage: ABD Pain Reason for Disposition  [1] MILD pain (e.g., does not interfere with normal activities) AND [2] pain comes and goes (cramps) [3] present > 48 hours  (Exception: This same abdominal pain is a chronic symptom recurrent or ongoing AND present > 4 weeks.)  Answer Assessment - Initial Assessment Questions 1. LOCATION: Where does it hurt?      Around the navel.  2. RADIATION: Does the pain shoot anywhere else? (e.g., chest, back)     He states he woke up this morning and was also having lower back pain.  3. ONSET: When did the pain begin? (Minutes, hours or days ago)      He states last week sometime, I tried to put it off but it isn't getting any better.  4. SUDDEN: Gradual or sudden onset?     Gradual.  5. PATTERN Does the pain come and go, or is it constant?    - If it comes and goes: How long does it last? Do you have pain now?     (Note: Comes and goes means the pain is intermittent. It goes away completely between bouts.)    - If constant: Is it getting better, staying the same, or getting worse?      (Note: Constant means the pain never goes away completely; most serious pain is constant and gets worse.)      Comes and goes, worse when trying to eat.  No pain at this time.  6. SEVERITY: How bad is the pain?  (e.g., Scale 1-10; mild, moderate, or severe)    - MILD (1-3): Doesn't interfere with normal activities, abdomen soft and not tender to touch.     - MODERATE (4-7): Interferes with normal activities or awakens from sleep, abdomen tender to touch.     - SEVERE (8-10): Excruciating pain, doubled over, unable to do any normal activities.       Denies any abdominal pain at this time.   7. RECURRENT SYMPTOM: Have you ever had this type of stomach pain before? If Yes, ask: When was the last time? and What happened that time?      He states he thinks he has had ulcers before and he thinks it might be the same kind of pain. He states it has been a long time ago.  8. CAUSE: What do you think is causing the stomach pain?     Unsure.  9. RELIEVING/AGGRAVATING FACTORS: What makes it better or worse? (e.g., antacids, bending or twisting motion, bowel movement)     Worse when eating. He states better when he drink the buttermilk.  10. OTHER SYMPTOMS: Do you have any other symptoms? (e.g., back pain, diarrhea, fever, urination pain, vomiting)       Decreased appetite, lower back pain, soft and irregular stools (he states he used  stool softeners)  Patient denies nausea, vomiting, fever, diarrhea, blood in stool. No appointments available til Wednesday, patient and friend on phone asking if anything available today. Called [facility] and spoke with Jada, confirmed no appointments and advised urgent care.  Protocols used: Abdominal Pain - Male-A-AH

## 2024-01-01 NOTE — ED Notes (Signed)
 Patient discharged by RN, patient ambulatory to lobby at time of discharge.

## 2024-01-01 NOTE — ED Provider Notes (Signed)
 [place] [facility] Provider Note   CSN: [id number] Arrival date & time: 01/01/24  1111     History Chief Complaint  Patient presents with   Abdominal Pain    [name] is a 68 y.o. male w/ PMHx hypertension, TBI, CVA, dyslipidemia, prediabetes liver nodule who presents to the ED for evaluation of lower abdominal pain.  Patient reports intermittent lower abdominal pain for the past 2 weeks.  He states last week he had some constipation which resolved after stool softeners.  He states normal bowel movements since then.  He denies any pain with urination urinary urgency or frequency.  No fevers chills.  No nausea vomiting.  Patient is not taking anything for the pain.  No significant pain at this time       Physical Exam Updated Vital Signs BP (!) 152/88 (BP Location: Right Arm)   Pulse 70   Temp 98 F (36.7 C) (Oral)   Resp 20   Ht 5' 7 (1.702 m)   Wt 72.6 kg   SpO2 98%   BMI 25.06 kg/m  Physical Exam Vitals and nursing note reviewed.  Constitutional:      General: He is not in acute distress.    Appearance: He is well-developed. He is not ill-appearing.  HENT:     Head: Normocephalic and atraumatic.     Mouth/Throat:     Mouth: Mucous membranes are moist.   Eyes:     Conjunctiva/sclera: Conjunctivae normal.    Cardiovascular:     Rate and Rhythm: Normal rate and regular rhythm.     Heart sounds: Normal heart sounds. No murmur heard. Pulmonary:     Effort: Pulmonary effort is normal. No respiratory distress.     Breath sounds: Normal breath sounds.  Abdominal:     General: There is no distension.     Palpations: Abdomen is soft.     Tenderness: There is no abdominal tenderness. There is no right CVA tenderness, left CVA tenderness, guarding or rebound.   Musculoskeletal:        General: No swelling.     Cervical back: Neck supple.   Skin:    General: Skin is warm and dry.     Capillary Refill: Capillary refill  takes less than 2 seconds.   Neurological:     Mental Status: He is alert.   Psychiatric:        Mood and Affect: Mood normal.     ED Results / Procedures / Treatments   Labs (all labs ordered are listed, but only abnormal results are displayed) Labs Reviewed  LIPASE, BLOOD  COMPREHENSIVE METABOLIC PANEL WITH GFR  CBC  URINALYSIS, ROUTINE W REFLEX MICROSCOPIC    EKG None  Radiology CT ABDOMEN PELVIS W CONTRAST Result Date: 01/01/2024 CLINICAL DATA:  Abdominal pain, acute, nonlocalized Abdominal/flank pain, stone suspected EXAM: CT ABDOMEN AND PELVIS WITH CONTRAST TECHNIQUE: Multidetector CT imaging of the abdomen and pelvis was performed using the standard protocol following bolus administration of intravenous contrast. RADIATION DOSE REDUCTION: This exam was performed according to the departmental dose-optimization program which includes automated exposure control, adjustment of the mA and/or kV according to patient size and/or use of iterative reconstruction technique. CONTRAST:  75mL OMNIPAQUE  IOHEXOL  350 MG/ML SOLN COMPARISON:  CT chest, abdomen, and pelvis dated 05/29/2023. FINDINGS: Lower chest: Similar chronic scarring in the right middle and left lower lobes. Hepatobiliary: Faint 12 x 8 mm enhancing lesion in the superior right hepatic lobe (3:13),  previously measuring 19 x 13 mm. Additional smaller enhancing lesion in the right hepatic lobe measures 6 x 6 mm (3:19), previously 10 x 7 mm. Differences in measurement may be somewhat attributable to variations in contrast bolus timing. These findings are favored to represent flash filling hemangiomas. Gallbladder is unremarkable. No biliary dilatation. Pancreas: Unremarkable. No pancreatic ductal dilatation or surrounding inflammatory changes. Spleen: Normal in size without focal abnormality. Adrenals/Urinary Tract: Adrenal glands are unremarkable. Kidneys are normal, without renal calculi, focal lesion, or hydronephrosis. Bladder is  unremarkable. Stomach/Bowel: Small hiatal hernia. Stomach is otherwise within normal limits. Appendix appears normal. No evidence of bowel wall thickening, distention, or inflammatory changes. Vascular/Lymphatic: Abdominal aorta is normal in caliber with atherosclerotic calcification. No enlarged abdominal or pelvic lymph nodes. Reproductive: Prostate is enlarged measuring up to 5.9 cm in diameter. Other: No abdominopelvic ascites. No intraperitoneal free air. Small fat containing umbilical hernia. Musculoskeletal: No acute osseous abnormality. No suspicious osseous lesions. Multilevel degenerative changes of the thoracolumbar spine. IMPRESSION: 1. No acute localizing findings in the abdomen or pelvis. Specifically, no evidence of urolithiasis or hydronephrosis. 2. Faint 12 x 8 mm enhancing lesion in the superior right hepatic lobe, previously measuring 19 x 13 mm. Additional smaller enhancing lesion in the right hepatic lobe measures 6 x 6 mm, previously 10 x 7 mm. Differences in measurement may be somewhat attributable to variations in contrast bolus timing. These findings are favored to represent flash filling hemangiomas. 3. Prostatomegaly. 4.  Aortic Atherosclerosis (ICD10-I70.0). Electronically Signed   By: Mannie Seek M.D.   On: 01/01/2024 15:30    Medications Ordered in ED Medications  iohexol  (OMNIPAQUE ) 350 MG/ML injection 75 mL (75 mLs Intravenous Contrast Given 01/01/24 1451)  acetaminophen  (TYLENOL ) tablet 1,000 mg (1,000 mg Oral Given 01/01/24 1533)    ED Course/ Medical Decision Making/ A&P  [name] is a 68 y.o. male presents as detailed above  Differential ddx: Urinary tract infection, pancreatitis, constipation, diverticulitis, colitis, enteritis,  On arrival, patient afebrile hemodynamically stable no hypoxia or respiratory distress.  Abdomen soft nontender  ED Work-up: Please see details of labs and imaging listed above. In summary, no evidence of acute  life-threatening illness or etiology.  Patient without surgical abdomen.  CT abdomen pelvis unremarkable.  No evidence of urinary tract infection.  No elevation of lipase to indicate pancreatitis.  No electrolyte abnormalities dehydration or anemia. Etiology of abdominal pain unclear at this time but no concern for acute life-threatening illness or etiology.      Overall impression abdominal pain Patient stable for discharge and outpatient follow-up. Strict return precautions provided. Patient voices understanding and agrees with plan.   Patient seen with supervising physician who agrees with plan.  Final Clinical Impression(s) / ED Diagnoses Final diagnoses:  Lower abdominal pain    Rx / DC Orders ED Discharge Orders          Ordered    dicyclomine (BENTYL) 20 MG tablet  2 times daily        01/01/24 1559            Angele Keller, DO PGY-3 Emergency Medicine    Angele Keller, DO 01/01/24 2235    Early Glisson, MD 01/02/24 1630

## 2024-01-01 NOTE — ED Triage Notes (Signed)
 Pt to ED via POV from UC c/o lower abdominal pain x 1 week, denies hx of abdominal surgery, Denies N/V. Reports last BM this morning, that was normal.

## 2024-01-01 NOTE — ED Provider Notes (Signed)
 [facility]    CSN: [id number] Arrival date & time: 01/01/24  7829      History   Chief Complaint Chief Complaint  Patient presents with   Abdominal Pain    HPI [name] is a 68 y.o. male.   Patient presents with 1 week history of periumbilical pain and some left sided abdominal pain.  Patient states that last week he did go 2 days without a bowel movement and he took some stool softeners with relief of this.  Patient states that he normally has a regular bowel movement every day without any difficulty, so this was abnormal for him.  Patient states that he did have a bowel movement this morning that was normal and not difficult to pass.  Patient states that he also did drink some buttermilk to try to help with this.  Patient denies any nausea, vomiting, diarrhea, fever, body aches, chills, flank pain, and urinary symptoms.  Patient states that this morning he did notice some bilateral low back pain as well.  Patient denies any known injuries or recent falls.  Of note patient does have a history of cocaine and EtOH abuse, but patient reports he is not use either of these in years.  Patient also has history of hypertension, hyperlipidemia, and a stroke in 2014.  The history is provided by the patient and medical records.  Abdominal Pain   Past Medical History:  Diagnosis Date   Arthritis    due to age   Back pain 05/22/2019   Bilateral varicoceles 08/28/2008   w/scrotal nodules on ultrasound likley granulomas   Cocaine abuse (HCC)    quit   ETOH abuse    quit   Headache    Hx of adenomatous polyp of colon    Hyperlipidemia    Hypertension    PONV (postoperative nausea and vomiting)    Right arm pain 05/29/2019   Stroke (HCC)    posterior limb, R internal capsule stroke 07/2012    Patient Active Problem List   Diagnosis Date Noted   Olecranon bursitis of right elbow 11/08/2023   Neuropathy of hand 08/30/2023   Traumatic nondisplaced  spondylolisthesis of second cervical vertebra with closed fracture (HCC) 08/30/2023   Bilateral shoulder pain 08/25/2023   Bilateral hand swelling 08/11/2023   Insomnia 07/26/2023   Tracheostomy complication (HCC) 07/26/2023   Seizures (HCC) 07/26/2023   Liver nodule 07/26/2023   Hyponatremia 07/26/2023   Anemia 07/26/2023   SAH (subarachnoid hemorrhage) (HCC) 07/04/2023   Dysphagia, pharyngoesophageal phase 07/04/2023   Malnutrition of moderate degree 06/01/2023   TBI (traumatic brain injury) (HCC) 05/29/2023   Sciatica of left side 05/12/2022   Hemorrhoids 01/25/2022   Weight loss 01/25/2022   Low back pain 10/19/2021   Left groin pain 01/25/2021   Acute right-sided low back pain 12/03/2020   Prediabetes 02/27/2020   Hx of adenomatous polyp of colon    History of colonic polyps 07/19/2016   Lumbar degenerative disc disease 08/05/2015   Erectile dysfunction 04/22/2015   Dyslipidemia 04/22/2015   Health care maintenance 05/14/2014   GERD (gastroesophageal reflux disease) 09/19/2012   History of CVA (cerebrovascular accident) 08/02/2012   Essential hypertension 05/31/2007    Past Surgical History:  Procedure Laterality Date   ANTERIOR CERVICAL DECOMPRESSION/DISCECTOMY FUSION 4 LEVELS N/A 02/18/2021   Procedure: ANTERIOR CERVICAL DECOMPRESSION FUSION CERVIAL 4- CERVICAL 5, CERVICAL 5- CERVICAL 6, CERVICAL 6- CERVICAL 7 WITH INSTRUMENTATION AND ALLOGRAFT;  Surgeon: Virl Grimes, MD;  Location: [facility] OR;  Service: Orthopedics;  Laterality: N/A;   bone spur repair Left    COLONOSCOPY     HAND SURGERY     left hand   TRACHEOSTOMY TUBE PLACEMENT N/A 06/19/2023   Procedure: TRACHEOSTOMY;  Surgeon: Anda Bamberg, MD;  Location: [facility] OR;  Service: General;  Laterality: N/A;       Home Medications    Prior to Admission medications   Medication Sig Start Date End Date Taking? Authorizing Provider  amLODipine  (NORVASC ) 10 MG tablet Take 1 tablet (10 mg total) by mouth daily.  10/04/23  Yes Nooruddin, Saad, MD  Cholecalciferol  (VITAMIN D3) 50 MCG (2000 UT) TABS Take 1 tablet (2,000 Units total) by mouth daily. 09/27/23  Yes Nooruddin, Saad, MD  diclofenac  Sodium (VOLTAREN ) 1 % GEL Apply 2 g topically to right ankle 4 (four) times daily. 08/11/23  Yes Aurora Lees, DO  gabapentin  (NEURONTIN ) 300 MG capsule Take 300 mg by mouth 2 (two) times daily. Takes 300 mg BID, 600 mg qhs 09/16/23  Yes [provider]  rosuvastatin  (CRESTOR ) 20 MG tablet Take 1 tablet (20 mg total) by mouth daily. 07/27/23 07/26/24 Yes Jayson Michael, MD  tadalafil  (CIALIS ) 10 MG tablet Take 1 tablet (10 mg total) by mouth daily as needed for erectile dysfunction. 12/07/23  Yes Nooruddin, Saad, MD  tiZANidine  (ZANAFLEX ) 4 MG tablet Take 0.5-1 tablets (2-4 mg total) by mouth at bedtime as needed for muscle spasms (for muscle tightness and pain preventing sleep). 12/06/23  Yes Nooruddin, Saad, MD  triamterene -hydrochlorothiazide  (MAXZIDE -25) 37.5-25 MG tablet Take 1 tablet by mouth once daily 12/05/23  Yes Nooruddin, Saad, MD  DULoxetine  (CYMBALTA ) 30 MG capsule Take 1 capsule (30 mg total) by mouth daily. 11/07/23 11/06/24  Masters, Katie, DO  meloxicam  (MOBIC ) 15 MG tablet Take 1 tablet (15 mg total) by mouth daily. 10/05/23 01/03/24  Cherri Corns C, DO  polyethylene glycol (MIRALAX ) 17 g packet Take 17 g by mouth daily as needed. 11/07/23   Masters, Alston Mako, DO    Family History Family History  Problem Relation Age of Onset   Pancreatic cancer Father    Colon cancer Neg Hx    Esophageal cancer Neg Hx    Rectal cancer Neg Hx    Stomach cancer Neg Hx     Social History Social History   Tobacco Use   Smoking status: Former    Current packs/day: 0.00    Types: Cigarettes    Quit date: 07/18/2012    Years since quitting: 11.4   Smokeless tobacco: Never  Vaping Use   Vaping status: Never Used  Substance Use Topics   Alcohol  use: No    Alcohol /week: 0.0 standard drinks of alcohol     Comment: quit  2011   Drug use: No    Comment: h/o cocaine abuse -- been a LONG time     Allergies   Ace inhibitors and Penicillins   Review of Systems Review of Systems  Gastrointestinal:  Positive for abdominal pain.   Per HPI  Physical Exam Triage Vital Signs ED Triage Vitals  Encounter Vitals Group     BP 01/01/24 0959 (!) 147/87     Girls Systolic BP Percentile --      Girls Diastolic BP Percentile --      Boys Systolic BP Percentile --      Boys Diastolic BP Percentile --      Pulse Rate 01/01/24 0959 62     Resp 01/01/24 0959 16     Temp 01/01/24  0959 97.8 F (36.6 C)     Temp Source 01/01/24 0959 Oral     SpO2 01/01/24 0959 95 %     Weight --      Height --      Head Circumference --      Peak Flow --      Pain Score 01/01/24 1000 7     Pain Loc --      Pain Education --      Exclude from Growth Chart --    No data found.  Updated Vital Signs BP (!) 147/87   Pulse 62   Temp 97.8 F (36.6 C) (Oral)   Resp 16   SpO2 95%   Visual Acuity Right Eye Distance:   Left Eye Distance:   Bilateral Distance:    Right Eye Near:   Left Eye Near:    Bilateral Near:     Physical Exam Vitals and nursing note reviewed.  Constitutional:      General: He is awake. He is not in acute distress.    Appearance: Normal appearance. He is well-developed and well-groomed. He is not ill-appearing.  HENT:     Mouth/Throat:     Mouth: Mucous membranes are moist.     Pharynx: Oropharynx is clear.   Cardiovascular:     Rate and Rhythm: Normal rate and regular rhythm.  Pulmonary:     Effort: Pulmonary effort is normal.     Breath sounds: Normal breath sounds.  Abdominal:     General: Bowel sounds are increased. There is distension.     Palpations: Abdomen is soft. There is no mass.     Tenderness: There is abdominal tenderness in the periumbilical area. There is no right CVA tenderness, left CVA tenderness, guarding or rebound.     Hernia: No hernia is present.   Skin:     General: Skin is warm and dry.   Neurological:     Mental Status: He is alert.   Psychiatric:        Behavior: Behavior is cooperative.      UC Treatments / Results  Labs (all labs ordered are listed, but only abnormal results are displayed) Labs Reviewed  POCT URINALYSIS DIP (MANUAL ENTRY) - Abnormal; Notable for the following components:      Result Value   Bilirubin, UA small (*)    Ketones, POC UA trace (5) (*)    All other components within normal limits    EKG   Radiology No results found.  Procedures Procedures (including critical care time)  Medications Ordered in UC Medications - No data to display  Initial Impression / Assessment and Plan / UC Course  I have reviewed the triage vital signs and the nursing notes.  Pertinent labs & imaging results that were available during my care of the patient were reviewed by me and considered in my medical decision making (see chart for details).     Patient is overall well-appearing.  Upon assessment tenderness noted to periumbilical region as well as just the left of the periumbilical region.  Abdominal distention noted on exam.  Bowel sounds hyperactive.  Urinalysis unremarkable.  Recommended patient be seen in the emergency department for CT imaging to rule out acute abdominal process due to tenderness and distention present.  Patient is agreeable to plan at this time.  Patient is able to arrive to the ER via POV. Final Clinical Impressions(s) / UC Diagnoses   Final diagnoses:  Periumbilical abdominal pain  Abdominal  distention     Discharge Instructions      Please go to the emergency department for further evaluation of your abdominal pain.     ED Prescriptions   None    PDMP not reviewed this encounter.   Levora Reas A, NP 01/01/24 [phone]

## 2024-01-04 DIAGNOSIS — S46011D Strain of muscle(s) and tendon(s) of the rotator cuff of right shoulder, subsequent encounter: Secondary | ICD-10-CM | POA: Diagnosis not present

## 2024-01-04 DIAGNOSIS — S46012D Strain of muscle(s) and tendon(s) of the rotator cuff of left shoulder, subsequent encounter: Secondary | ICD-10-CM | POA: Diagnosis not present

## 2024-01-09 DIAGNOSIS — S46011D Strain of muscle(s) and tendon(s) of the rotator cuff of right shoulder, subsequent encounter: Secondary | ICD-10-CM | POA: Diagnosis not present

## 2024-01-09 DIAGNOSIS — S46012D Strain of muscle(s) and tendon(s) of the rotator cuff of left shoulder, subsequent encounter: Secondary | ICD-10-CM | POA: Diagnosis not present

## 2024-01-10 ENCOUNTER — Telehealth: Payer: Self-pay

## 2024-01-10 NOTE — Telephone Encounter (Signed)
 I called and spoke with pt's daughter, pt is sch for 01/17/2024 @ 1:30pm.

## 2024-01-10 NOTE — Telephone Encounter (Signed)
 Copied from CRM [id number]. Topic: Appointments - Appointment Scheduling >> Jan 10, 2024  8:34 AM [name] wrote: Patient/patient representative is calling to schedule an appointment. Patient was in the ER in the last 14 days and patient also has a new problem which is a boil, could you assist? Patients callback number is [phone].

## 2024-01-16 NOTE — Progress Notes (Unsigned)
 Patient name: [name] Date of birth: May 24, 1956 Date of visit: 01/17/24  Type of visit: Established Patient Office Visit   Subjective   Chief concern:  Chief Complaint  Patient presents with   Recurrent Skin Infections    [name] is a 68 y.o. male with a PMHx of CVA, SAH, TBI, prediabetes, HTN, and dyslipidemia who presents to [facility] clinic for ED follow up after being seen for lower abdominal pain on 01/01/24.    Follow up Emergency Department Visit  Patient was evaluated at [facility] ED and discharged on 01/01/24. He was treated for lower abdominal pain. Treatment for this included dicyclomine. He reports this condition is improved.   He is no longer concerned about the abdominal pain. Instead, [name] is complaining of a purulent mass near his pubic bone for 1 week. States that it was painful but the pain has gone down recently to a 5/10. Has not tried any medication. He had a similar purulent mass a few months ago that went away on its own. He denies fever and sweats. Denies burning with urination or any past STIs. No new bug bites, but does mention he is frequently outdoors. Sexually active with women only, uses protection, has 1 sexual partner, no new sexual partners in past year.   Past Medical History:  Diagnosis Date   Arthritis    due to age   Back pain 05/22/2019   Bilateral varicoceles 08/28/2008   w/scrotal nodules on ultrasound likley granulomas   Cocaine abuse (HCC)    quit   ETOH abuse    quit   Headache    Hx of adenomatous polyp of colon    Hyperlipidemia    Hypertension    PONV (postoperative nausea and vomiting)    Right arm pain 05/29/2019   Stroke [facility])    posterior limb, R internal capsule stroke 07/2012    Past Surgical History:  Procedure Laterality Date   ANTERIOR CERVICAL DECOMPRESSION/DISCECTOMY FUSION 4 LEVELS N/A 02/18/2021   Procedure: ANTERIOR CERVICAL DECOMPRESSION FUSION CERVIAL 4- CERVICAL 5, CERVICAL 5- CERVICAL 6,  CERVICAL 6- CERVICAL 7 WITH INSTRUMENTATION AND ALLOGRAFT;  Surgeon: Beuford Anes, MD;  Location: [facility] OR;  Service: Orthopedics;  Laterality: N/A;   bone spur repair Left    COLONOSCOPY     HAND SURGERY     left hand   TRACHEOSTOMY TUBE PLACEMENT N/A 06/19/2023   Procedure: TRACHEOSTOMY;  Surgeon: Paola Dreama SAILOR, MD;  Location: [facility] OR;  Service: General;  Laterality: N/A;    Review of Systems  Constitutional:  Negative for diaphoresis, fever and weight loss.  Respiratory:  Negative for shortness of breath.   Cardiovascular:  Negative for chest pain.  Gastrointestinal:  Positive for abdominal pain. Negative for blood in stool, constipation and diarrhea.  Skin:  Negative for itching.       + for groin mass    Current Outpatient Medications  Medication Instructions   amLODipine  (NORVASC ) 10 mg, Oral, Daily   diclofenac  Sodium (VOLTAREN ) 1 % GEL Apply 2 g topically to right ankle 4 (four) times daily.   gabapentin  (NEURONTIN ) 300 mg, 2 times daily   polyethylene glycol (MIRALAX ) 17 g, Oral, Daily PRN   rosuvastatin  (CRESTOR ) 20 mg, Oral, Daily   tadalafil  (CIALIS ) 10 mg, Oral, Daily PRN   tiZANidine  (ZANAFLEX ) 2-4 mg, Oral, At bedtime PRN   triamterene -hydrochlorothiazide  (MAXZIDE -25) 37.5-25 MG tablet 1 tablet, Oral, Daily   Vitamin D3 2,000 Units, Oral, Daily  Social History   Tobacco Use   Smoking status: Former    Current packs/day: 0.00    Types: Cigarettes    Quit date: 07/18/2012    Years since quitting: 11.5   Smokeless tobacco: Never  Vaping Use   Vaping status: Never Used  Substance Use Topics   Alcohol  use: No    Alcohol /week: 0.0 standard drinks of alcohol     Comment: quit 2011   Drug use: No    Comment: h/o cocaine abuse -- been a LONG time    Social history: Occupation: mows grass as a side job, 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    Objective  Today's Vitals   01/17/24 1325  BP: 135/85  Pulse: 73  Temp: 97.8 F (36.6 C)  TempSrc: Oral  SpO2: 99%  Weight: 157 lb 3.2 oz (71.3 kg)  Height: 5' 7 (1.702 m)  PainSc: 0-No pain  Body mass index is 24.62 kg/m.   Physical Exam Constitutional:      General: He is not in acute distress.    Appearance: Normal appearance. He is not ill-appearing, toxic-appearing or diaphoretic.  HENT:     Head: Normocephalic and atraumatic.  Eyes:     General: No scleral icterus.    Conjunctiva/sclera: Conjunctivae normal.     Pupils: Pupils are equal, round, and reactive to light.  Cardiovascular:     Rate and Rhythm: Normal rate and regular rhythm.     Heart sounds: Normal heart sounds. No murmur heard. Pulmonary:     Effort: Pulmonary effort is normal. No respiratory distress.     Breath sounds: Normal breath sounds. No stridor. No wheezing, rhonchi or rales.  Chest:     Chest wall: No tenderness.  Abdominal:     General: Abdomen is flat. There is no distension.     Palpations: Abdomen is soft. There is no mass.     Tenderness: There is no abdominal tenderness. There is no right CVA tenderness, left CVA tenderness, guarding or rebound.  Skin:    General: Skin is warm and moist.     Findings: Lesion (erythematous lesion with prominent vascularity measuring approximately 1cm in diameter located on left pubic area superior to penis; non-fluctuant, raised) present.      Neurological:     Mental Status: He is alert.  Psychiatric:        Mood and Affect: Mood normal.        Behavior: Behavior normal.        Thought Content: Thought content normal.     Last CBC Lab Results  Component Value Date   WBC 4.4 01/01/2024   HGB 15.4 01/01/2024   HCT 47.0 01/01/2024   MCV 90.2 01/01/2024   MCH 29.6 01/01/2024   RDW 12.9 01/01/2024   PLT 284 01/01/2024   Last metabolic panel Lab Results  Component Value Date   GLUCOSE 98  01/01/2024   NA 138 01/01/2024   K 4.0 01/01/2024   CL 103 01/01/2024   CO2 28 01/01/2024   BUN 14 01/01/2024   CREATININE 0.83 01/01/2024   GFRNONAA >60 01/01/2024   CALCIUM  9.2 01/01/2024   PHOS 3.6 06/14/2023   PROT 6.8 01/01/2024   ALBUMIN 4.0 01/01/2024   LABGLOB 2.8 08/11/2023   AGRATIO 1.9 01/12/2022   BILITOT 0.7 01/01/2024  ALKPHOS 47 01/01/2024   AST 17 01/01/2024   ALT 24 01/01/2024   ANIONGAP 7 01/01/2024   Last lipids Lab Results  Component Value Date   CHOL 266 (H) 07/26/2023   HDL 55 07/26/2023   LDLCALC 197 (H) 07/26/2023   TRIG 82 07/26/2023   CHOLHDL 4.8 07/26/2023      Assessment & Plan  Problem List Items Addressed This Visit       Digestive   Liver nodule   -pt liver nodule seen on CT on 01/01/24 as contrast-enhancing lesions in the right superior hepatic lobe, likely representing flash hemangiomas -will reschedule U/S appt        Musculoskeletal and Integument   Folliculitis - Primary   -Given course of this mass and previous one, consistent with folliculitis. Patient has past history of the same problem.  -Will not need biopsy; not as concerned for cancerous process -Counseled patient on good hygiene with antibacterial soap Lever 2000        Other   Health care maintenance (Chronic)   Other hyperlipidemia   -recheck lipid panel today to assess response from rosuvastatin  20mg  that was started January 2025      Relevant Orders   Lipid panel   Lower abdominal pain   -pain has improved, patient no longer concerned -CT abd/pelvis not showing any acute processes leading to abdominal pain including nephro/ureterolithiasis -During ED visit, pt afebrile with normal CBC/CMP/lipase - pancreatitis unlikely -Plan: conservative measures unless pain returns or worsens        Return in about 3 months (around 04/18/2024) for recheck lipids/follow up liver nodule.  Patient discussed with Dr. Jeanelle, who also saw and evaluated the  patient.  Laszlo Ellerby, MD [facility]  PGY-1 01/17/2024, 2:50 PM

## 2024-01-17 ENCOUNTER — Ambulatory Visit (INDEPENDENT_AMBULATORY_CARE_PROVIDER_SITE_OTHER)

## 2024-01-17 VITALS — BP 135/85 | HR 73 | Temp 97.8°F | Ht 67.0 in | Wt 157.2 lb

## 2024-01-17 DIAGNOSIS — Z0000 Encounter for general adult medical examination without abnormal findings: Secondary | ICD-10-CM

## 2024-01-17 DIAGNOSIS — E7849 Other hyperlipidemia: Secondary | ICD-10-CM | POA: Diagnosis not present

## 2024-01-17 DIAGNOSIS — K7689 Other specified diseases of liver: Secondary | ICD-10-CM

## 2024-01-17 DIAGNOSIS — L739 Follicular disorder, unspecified: Secondary | ICD-10-CM

## 2024-01-17 DIAGNOSIS — R1030 Lower abdominal pain, unspecified: Secondary | ICD-10-CM | POA: Diagnosis not present

## 2024-01-17 NOTE — Patient Instructions (Addendum)
 Thank you, Mr.Yitzhak A Dunson for allowing us  to provide your care today. Today we discussed the following:  -skin infection of the groin known as folliculitis -please maintain good hygiene and wash your groin area everyday with soap  -you can use Lever 2000 bar soap which is antibiotic -we are also rechecking your lipid levels after you started Rosuvastatin  (CRESTOR )  I have ordered the following labs for you:   Lab Orders         Lipid panel      Tests ordered today:  Lipid panel  Referrals ordered today:   Referral Orders  No referral(s) requested today     I have ordered the following medication/changed the following medications:   Stop the following medications: Medications Discontinued During This Encounter  Medication Reason   dicyclomine (BENTYL) 20 MG tablet    DULoxetine  (CYMBALTA ) 30 MG capsule      Start the following medications: No orders of the defined types were placed in this encounter.    Follow up: 3 months    Should you have any questions or concerns please call the Internal Medicine Clinic at [phone].     Carrington Mullenax, MD [facility]

## 2024-01-17 NOTE — Assessment & Plan Note (Signed)
-  pain has improved, patient no longer concerned -CT abd/pelvis not showing any acute processes leading to abdominal pain including nephro/ureterolithiasis -During ED visit, pt afebrile with normal CBC/CMP/lipase - pancreatitis unlikely -Plan: conservative measures unless pain returns or worsens

## 2024-01-17 NOTE — Assessment & Plan Note (Addendum)
-  Given course of this mass and previous one, consistent with folliculitis. Patient has past history of the same problem.  -Will not need biopsy; not as concerned for cancerous process -Counseled patient on good hygiene with antibacterial soap Lever 2000

## 2024-01-17 NOTE — Assessment & Plan Note (Signed)
-  pt liver nodule seen on CT on 01/01/24 as contrast-enhancing lesions in the right superior hepatic lobe, likely representing flash hemangiomas -will reschedule U/S appt

## 2024-01-17 NOTE — Assessment & Plan Note (Addendum)
-  recheck lipid panel today to assess response from rosuvastatin  20mg  that was started January 2025

## 2024-01-18 ENCOUNTER — Ambulatory Visit: Payer: Self-pay

## 2024-01-18 ENCOUNTER — Other Ambulatory Visit: Payer: Self-pay

## 2024-01-18 LAB — LIPID PANEL
Chol/HDL Ratio: 3.1 ratio (ref 0.0–5.0)
Cholesterol, Total: 162 mg/dL (ref 100–199)
HDL: 53 mg/dL (ref >39)
LDL Chol Calc (NIH): 91 mg/dL (ref 0–99)
Triglycerides: 99 mg/dL (ref 0–149)
VLDL Cholesterol Cal: 18 mg/dL (ref 5–40)

## 2024-01-18 MED ORDER — ROSUVASTATIN CALCIUM 40 MG PO TABS
40.0000 mg | ORAL_TABLET | Freq: Every day | ORAL | 3 refills | Status: AC
Start: 2024-01-18 — End: ?

## 2024-01-18 NOTE — Progress Notes (Signed)
 Called patient and his daughter. Informed them of new lipid results and plan to increase dose of rosuvastatin  to 40mg  to help achieve goal LDL < 70 for secondary ASCVD prevention. Patient is agreeable with plan.

## 2024-01-18 NOTE — Progress Notes (Signed)
 Internal Medicine Clinic Attending  I was physically present during the key portions of the resident provided service and participated in the medical decision making of patient's management care. I reviewed pertinent patient test results.  The assessment, diagnosis, and plan were formulated together and I agree with the documentation in the resident's note.  Carney Living, MD

## 2024-01-26 ENCOUNTER — Other Ambulatory Visit: Payer: Self-pay

## 2024-02-09 ENCOUNTER — Ambulatory Visit (HOSPITAL_COMMUNITY)
Admission: RE | Admit: 2024-02-09 | Discharge: 2024-02-09 | Disposition: A | Discharge status: Home / Self Care | Source: Ambulatory Visit | Attending: Family Medicine | Admitting: Family Medicine

## 2024-02-09 DIAGNOSIS — K7689 Other specified diseases of liver: Secondary | ICD-10-CM | POA: Diagnosis present

## 2024-02-13 ENCOUNTER — Telehealth: Payer: Self-pay | Admitting: *Deleted

## 2024-02-13 NOTE — Telephone Encounter (Signed)
 Copied from CRM [id number]. Topic: Clinical - Lab/Test Results >> Feb 13, 2024  9:16 AM Rosaria A wrote: Reason for CRM: Patients daughter Nonnie Berber is calling to check on his abdomen scan results from last Friday. Please call Nonnie back to discuss once results come back.

## 2024-02-13 NOTE — Telephone Encounter (Signed)
 Called pt's daughter - no answer; left message test result pending and a message was sent to the doctor of their request.

## 2024-02-16 ENCOUNTER — Telehealth: Payer: Self-pay | Admitting: *Deleted

## 2024-02-16 NOTE — Telephone Encounter (Signed)
 Call from patient and his daughter requesting results of Scan.  Informed patient that message has been forward too the doctors and they should be getting a call this afternoon .

## 2024-02-16 NOTE — Telephone Encounter (Signed)
 Spoke to patient and daughter about benign appearing liver nodules, likely hemangiomas. No treatment or follow-up necessary.  Ozell Kung MD 02/16/2024, 12:00 PM

## 2024-02-16 NOTE — Telephone Encounter (Signed)
 Will forward to [facility] Team to call patient's daughter with results. Copied from CRM [id number]. Topic: Clinical - Lab/Test Results >> Feb 13, 2024  9:16 AM Rosaria A wrote: Reason for CRM: Patients daughter Nonnie Berber is calling to check on his abdomen scan results from last Friday. Please call Nonnie back to discuss once results come back. >> Feb 16, 2024  9:13 AM Graeme ORN wrote:  Burnard Ducking (Child) [phone] called back. States results came in. She would like to have call back once reviewed by provider. Thank You

## 2024-02-24 ENCOUNTER — Other Ambulatory Visit: Payer: Self-pay | Admitting: Student

## 2024-02-26 NOTE — Telephone Encounter (Signed)
 Medication sent to pharmacy

## 2024-03-24 ENCOUNTER — Other Ambulatory Visit: Payer: Self-pay | Admitting: Student

## 2024-04-25 ENCOUNTER — Other Ambulatory Visit: Payer: Self-pay | Admitting: Student

## 2024-04-25 NOTE — Telephone Encounter (Signed)
 Medication sent to pharmacy

## 2024-05-10 ENCOUNTER — Ambulatory Visit: Payer: Self-pay

## 2024-05-10 ENCOUNTER — Ambulatory Visit: Admitting: Student

## 2024-05-10 VITALS — BP 127/79 | HR 76 | Temp 97.7°F | Ht 67.0 in | Wt 152.0 lb

## 2024-05-10 DIAGNOSIS — Y929 Unspecified place or not applicable: Secondary | ICD-10-CM

## 2024-05-10 DIAGNOSIS — Y93H2 Activity, gardening and landscaping: Secondary | ICD-10-CM

## 2024-05-10 DIAGNOSIS — M545 Low back pain, unspecified: Secondary | ICD-10-CM

## 2024-05-10 DIAGNOSIS — S39012A Strain of muscle, fascia and tendon of lower back, initial encounter: Secondary | ICD-10-CM

## 2024-05-10 MED ORDER — DICLOFENAC SODIUM 1 % EX GEL: 2.0000 g | Freq: Four times a day (QID) | CUTANEOUS | 0 refills | Status: AC

## 2024-05-10 MED ORDER — NAPROXEN 500 MG PO TABS: 500.0000 mg | ORAL_TABLET | Freq: Two times a day (BID) | ORAL | 0 refills | Status: DC

## 2024-05-10 NOTE — Telephone Encounter (Signed)
 FYI Only or Action Required?: FYI only for provider.  Patient was last seen in primary care on 01/17/2024 by Waymond Cart, MD.  Called Nurse Triage reporting Back Pain.  Symptoms began several days ago.  Interventions attempted: OTC medications: Tylenol .  Symptoms are: gradually worsening.  Triage Disposition: See HCP Within 4 Hours (Or PCP Triage)  Patient/caregiver understands and will follow disposition?: Yes   Copied from CRM #[id number]. Topic: Clinical - Red Word Triage >> May 10, 2024  8:06 AM Farrel B wrote: [facility] that prompted transfer to Nurse Triage: lower back pain Reason for Disposition  [1] SEVERE back pain (e.g., excruciating, unable to do any normal activities) AND [2] not improved 2 hours after pain medicine  Answer Assessment - Initial Assessment Questions Denies pain with urination, blood in urine. Patient states this same type of injury happened years ago and he needed a muscle relaxer for improvement.  1. ONSET: When did the pain begin? (e.g., minutes, hours, days)     Wednesday 2. LOCATION: Where does it hurt? (upper, mid or lower back)     Right side of lower back 3. SEVERITY: How bad is the pain?  (e.g., Scale 1-10; mild, moderate, or severe)     Patient states it gets up to a 7 or 8 4. PATTERN: Is the pain constant? (e.g., yes, no; constant, intermittent)      Worsens when he tries to use his back and move around, not bad when he is lying still 5. RADIATION: Does the pain shoot into your legs or somewhere else?     No 6. CAUSE:  What do you think is causing the back pain?      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 BACK OVERUSE:  Any recent lifting of heavy objects, strenuous work or exercise?     Yes lifting of heavy objects 8. MEDICINES: What have you taken so far for the pain? (e.g., nothing, acetaminophen , NSAIDS)     Tylenol   9. NEUROLOGIC SYMPTOMS: Do you have any weakness, numbness, or problems with  bowel/bladder control?     No 10. OTHER SYMPTOMS: Do you have any other symptoms? (e.g., fever, abdomen pain, burning with urination, blood in urine)  Protocols used: Back Pain-A-AH

## 2024-05-10 NOTE — Patient Instructions (Signed)
 Thank you so much for coming to the clinic today!   I have sent in the anti-inflammatory medications  If your pain does not get better or gets worse please let us  know!  If you have any questions please feel free to the call the clinic at anytime at [phone]. It was a pleasure seeing you!  Best, Dr. Licia Harl

## 2024-05-11 NOTE — Progress Notes (Signed)
 CC: Back pain   HPI:  [name] is a 68 y.o. male living with a history stated below and presents today for back pain. Please see problem based assessment and plan for additional details.   Discussed the use of AI scribe software for clinical note transcription with the patient, who gave verbal consent to proceed.  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  He experienced a pulling sensation in the right lower back after twisting awkwardly while lifting a bag of grass seed or using a backpack sprayer. The pain is localized to the right lower back and does not radiate. It is described as a pulling sensation that worsens with movement and is noticeable even when sitting still.  He has experienced similar pain several years ago, for which he was prescribed muscle relaxers and strong ibuprofen . Over-the-counter ibuprofen  is ineffective for his pain. No fever, chills, urinary difficulties, or weakness are reported.  Currently, he is not taking any medications for this issue but recalls using naproxen  in the past. He is familiar with the medication and its use from a previous episode of similar pain approximately seven years ago.  He is retired and does aeronautical engineer work on the side.    Past Medical History:  Diagnosis Date   Arthritis    due to age   Back pain 05/22/2019   Bilateral varicoceles 08/28/2008   w/scrotal nodules on ultrasound likley granulomas   Cocaine abuse (HCC)    quit   ETOH abuse    quit   Headache    Hx of adenomatous polyp of colon    Hyperlipidemia    Hypertension    PONV (postoperative nausea and vomiting)    Right arm pain 05/29/2019   Stroke [facility])    posterior limb, R internal capsule stroke 07/2012    Current Outpatient Medications on File Prior to Visit  Medication Sig Dispense Refill   amLODipine  (NORVASC ) 10 MG tablet Take 1 tablet  (10 mg total) by mouth daily. 30 tablet 0   Cholecalciferol  (VITAMIN D3) 50 MCG (2000 UT) TABS Take 1 tablet (2,000 Units total) by mouth daily. 30 tablet 0   gabapentin  (NEURONTIN ) 300 MG capsule Take 300 mg by mouth 2 (two) times daily. Takes 300 mg BID, 600 mg qhs     polyethylene glycol (MIRALAX ) 17 g packet Take 17 g by mouth daily as needed. 14 each 0   rosuvastatin  (CRESTOR ) 40 MG tablet Take 1 tablet (40 mg total) by mouth daily. 90 tablet 3   tadalafil  (CIALIS ) 10 MG tablet TAKE 1 TABLET BY MOUTH ONCE DAILY AS NEEDED FOR ERECTILE DYSFUNCTION 10 tablet 0   tiZANidine  (ZANAFLEX ) 4 MG tablet Take 0.5-1 tablets (2-4 mg total) by mouth at bedtime as needed for muscle spasms (for muscle tightness and pain preventing sleep). 30 tablet 0   triamterene -hydrochlorothiazide  (MAXZIDE -25) 37.5-25 MG tablet Take 1 tablet by mouth once daily 30 tablet 3   No current facility-administered medications on file prior to visit.    Family History  Problem Relation Age of Onset   Pancreatic cancer Father    Colon cancer Neg Hx    Esophageal cancer Neg Hx    Rectal cancer Neg Hx    Stomach cancer Neg Hx     Social History   Socioeconomic History   Marital status: Single    Spouse name: Not on file  Number of children: Not on file   Years of education: Not on file   Highest education level: Not on file  Occupational History   Not on file  Tobacco Use   Smoking status: Former    Current packs/day: 0.00    Types: Cigarettes    Quit date: 07/18/2012    Years since quitting: 11.8   Smokeless tobacco: Never  Vaping Use   Vaping status: Never Used  Substance and Sexual Activity   Alcohol  use: No    Alcohol /week: 0.0 standard drinks of alcohol     Comment: quit 2011   Drug use: No    Comment: h/o cocaine abuse -- been a LONG time   Sexual activity: Not on file  Other Topics Concern   Not on file  Social History Narrative   Pt  lives alone    Social Drivers of Health   Financial  Resource Strain: Low Risk  (05/12/2022)   Overall Financial Resource Strain (CARDIA)    Difficulty of Paying Living Expenses: Not hard at all  Food Insecurity: Patient Unable To Answer (06/06/2023)   Hunger Vital Sign    Worried About Programme Researcher, Broadcasting/film/video in the Last Year: Patient unable to answer    Ran Out of Food in the Last Year: Patient unable to answer  Transportation Needs: Patient Unable To Answer (06/06/2023)   PRAPARE - Transportation    Lack of Transportation (Medical): Patient unable to answer    Lack of Transportation (Non-Medical): Patient unable to answer  Physical Activity: Unknown (05/12/2022)   Exercise Vital Sign    Days of Exercise per Week: 7 days    Minutes of Exercise per Session: Not on file  Stress: No Stress Concern Present (05/12/2022)   [employer] of Occupational Health - Occupational Stress Questionnaire    Feeling of Stress : Not at all  Social Connections: Moderately Isolated (05/12/2022)   Social Connection and Isolation Panel    Frequency of Communication with Friends and Family: More than three times a week    Frequency of Social Gatherings with Friends and Family: More than three times a week    Attends Religious Services: More than 4 times per year    Active Member of [employer] or Organizations: No    Attends Banker Meetings: Never    Marital Status: Separated  Intimate Partner Violence: Patient Unable To Answer (06/08/2023)   Humiliation, Afraid, Rape, and Kick questionnaire    Fear of Current or Ex-Partner: Patient unable to answer    Emotionally Abused: Patient unable to answer    Physically Abused: Patient unable to answer    Sexually Abused: Patient unable to answer    Review of Systems: ROS negative except for what is noted on the assessment and plan.  Vitals:   05/10/24 0950  BP: 127/79  Pulse: 76  Temp: 97.7 F (36.5 C)  TempSrc: Oral  SpO2: 99%  Weight: 152 lb (68.9 kg)  Height: 5' 7 (1.702 m)    Physical  Exam: Constitutional: well-appearing male  in no acute distress Cardiovascular: regular rate and rhythm, no m/r/g Pulmonary/Chest: normal work of breathing on room air, lungs clear to auscultation bilaterally MSK: normal bulk and tone, mild tenderness to palpation in right lower back, still has full ROM with extension and flexion of back Neurological: alert & oriented x 3, 5/5 strength in bilateral upper and lower extremities, normal gait   Assessment & Plan:   Acute right-sided low back pain Recurrent 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 Pain localized to right lower back, worsened by movement. No systemic symptoms. - Prescribed naproxen  twice daily with meals for 10 days. - Prescribed anti-inflammatory gel for topical use up to four times daily as needed. - Advised return to clinic if pain persists or worsens. - Discussed muscle relaxer side effects; consider if current treatment fails.       Patient discussed with Dr. Trudy Dirks Catharine Kettlewell, M.D. [facility] Internal Medicine, PGY-3 Pager: [phone] Date 05/11/2024 Time 9:07 PM

## 2024-05-11 NOTE — Assessment & Plan Note (Signed)
 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 Pain localized to right lower back, worsened by movement. No systemic symptoms. - Prescribed naproxen  twice daily with meals for 10 days. - Prescribed anti-inflammatory gel for topical use up to four times daily as needed. - Advised return to clinic if pain persists or worsens. - Discussed muscle relaxer side effects; consider if current treatment fails.

## 2024-05-20 ENCOUNTER — Other Ambulatory Visit: Payer: Self-pay | Admitting: Student

## 2024-05-20 DIAGNOSIS — I10 Essential (primary) hypertension: Secondary | ICD-10-CM

## 2024-05-21 NOTE — Telephone Encounter (Signed)
 Medication sent to pharmacy

## 2024-05-22 NOTE — Progress Notes (Signed)
 [facility] Attending  Case discussed with the resident at the time of the visit.  We reviewed the resident's history and exam and pertinent patient test results.  I agree with the assessment, diagnosis, and plan of care documented in the resident's note.

## 2024-05-29 ENCOUNTER — Other Ambulatory Visit: Payer: Self-pay | Admitting: Student

## 2024-05-30 NOTE — Telephone Encounter (Signed)
 Medication sent to pharmacy

## 2024-07-12 ENCOUNTER — Ambulatory Visit (HOSPITAL_COMMUNITY)
Admission: EM | Admit: 2024-07-12 | Discharge: 2024-07-12 | Disposition: A | Discharge status: Home / Self Care | Attending: Physician Assistant | Admitting: Physician Assistant

## 2024-07-12 ENCOUNTER — Other Ambulatory Visit: Payer: Self-pay | Admitting: Student

## 2024-07-12 ENCOUNTER — Ambulatory Visit: Payer: Self-pay

## 2024-07-12 ENCOUNTER — Ambulatory Visit (HOSPITAL_COMMUNITY)

## 2024-07-12 ENCOUNTER — Ambulatory Visit (HOSPITAL_COMMUNITY): Payer: Self-pay | Admitting: Physician Assistant

## 2024-07-12 ENCOUNTER — Encounter (HOSPITAL_COMMUNITY): Payer: Self-pay

## 2024-07-12 DIAGNOSIS — M5135 Other intervertebral disc degeneration, thoracolumbar region: Secondary | ICD-10-CM | POA: Insufficient documentation

## 2024-07-12 DIAGNOSIS — M545 Low back pain, unspecified: Secondary | ICD-10-CM

## 2024-07-12 DIAGNOSIS — Z113 Encounter for screening for infections with a predominantly sexual mode of transmission: Secondary | ICD-10-CM | POA: Insufficient documentation

## 2024-07-12 DIAGNOSIS — M546 Pain in thoracic spine: Secondary | ICD-10-CM | POA: Insufficient documentation

## 2024-07-12 DIAGNOSIS — D369 Benign neoplasm, unspecified site: Secondary | ICD-10-CM | POA: Insufficient documentation

## 2024-07-12 MED ORDER — BACLOFEN 5 MG PO TABS: 5.0000 mg | ORAL_TABLET | Freq: Every evening | ORAL | 0 refills | Status: DC | PRN

## 2024-07-12 MED ORDER — LIDOCAINE 5 % EX PTCH: 1.0000 | MEDICATED_PATCH | CUTANEOUS | 0 refills | Status: AC

## 2024-07-12 NOTE — ED Triage Notes (Signed)
 Pt has c/o knots on the back that is causing back pain  started 3 days ago. Pt states he has 3 knots. 1 knot on the right side of the neck, 1 knot on the right shoulder, and 1 in the middle of the back. Pt has been taking tylenol  for the pain with little relief, last dose of tylenol  at 6 am.

## 2024-07-12 NOTE — Telephone Encounter (Signed)
 FYI Only or Action Required?: Action required by provider: request for appointment.  Patient was last seen in primary care on 05/10/2024 by Nooruddin, Saad, MD.  Called Nurse Triage reporting Back Pain.  Symptoms began several days ago.  Interventions attempted: OTC medications: Tylenol .  Symptoms are: gradually worsening.Mid back pain. Did not sleep last night because of pain. No known injury.  Triage Disposition: See HCP Within 4 Hours (Or PCP Triage)  Patient/caregiver understands and will follow disposition?: Yes     Copied from CRM [id number]. Topic: Clinical - Red Word Triage >> Jul 12, 2024  8:23 AM Miquel SAILOR wrote: Red Word that prompted transfer to Nurse Triage: PT daughter Nonnie and PT stated he has Back pain center for last 2 days/Sleeping issue due to it/Heat pack and pain medication not working. Server pain Reason for Disposition  [1] SEVERE back pain (e.g., excruciating, unable to do any normal activities) AND [2] not improved 2 hours after pain medicine  Answer Assessment - Initial Assessment Questions 1. ONSET: When did the pain begin? (e.g., minutes, hours, days)     3 days 2. LOCATION: Where does it hurt? (upper, mid or lower back)     middle 3. SEVERITY: How bad is the pain?  (e.g., Scale 1-10; mild, moderate, or severe)     severe 4. PATTERN: Is the pain constant? (e.g., yes, no; constant, intermittent)      constant 5. RADIATION: Does the pain shoot into your legs or somewhere else?     no 6. CAUSE:  What do you think is causing the back pain?      unsure 7. BACK OVERUSE:  Any recent lifting of heavy objects, strenuous work or exercise?     no 8. MEDICINES: What have you taken so far for the pain? (e.g., nothing, acetaminophen , NSAIDS)     Tylenol  9. NEUROLOGIC SYMPTOMS: Do you have any weakness, numbness, or problems with bowel/bladder control?     no 10. OTHER SYMPTOMS: Do you have any other symptoms? (e.g., fever, abdomen pain,  burning with urination, blood in urine)       no 11. PREGNANCY: Is there any chance you are pregnant? When was your last menstrual period?       N/a  Protocols used: Back Pain-A-AH

## 2024-07-12 NOTE — Discharge Instructions (Addendum)
 I saw a lot of arthritis on your x-ray.  I will contact you if the radiologist sees something that I did not that changes our treatment plan.  Take Tylenol  for pain relief.  Apply lidocaine  patch during the day and then remove this at night; use only 1 patch for 24 hours.  Take baclofen  at night.  This will make you sleepy so do not drive or drink alcohol  taking it.  Take it just before you go to bed as this can increase the risk of falls.  This replaces previously prescribed tizanidine  (Zanaflex ) as these are similar medications and should not be combined.  I recommend following up with sports medicine for further evaluation and management as they can help arrange additional imaging such as an MRI and referral to physical therapy.  If anything worsens and you have worsening pain, weight loss, fever, weakness, going to the bathroom yourself without noticing it, numbness or tingling in your legs you need to be seen immediately.  I will contact you if your testing is abnormal.  Abstain from sex and to receive the result.  If you develop any rash or lesions please return for reevaluation.  The knots that are on your neck are likely cysts.  As long as they are not bothering you we will leave them alone.  If they become red, painful, begin to drain something please return so we can consider additional treatment.

## 2024-07-12 NOTE — ED Provider Notes (Signed)
 " [facility]    CSN: [id number] Arrival date & time: 07/12/24  9056      History   Chief Complaint Chief Complaint  Patient presents with   Back Pain   Cyst    HPI [name] is a 68 y.o. male.   Patient presents today with a 3-day history of lower back pain.  He denies any known injury or increase in activity prior to symptom onset.  Denies previous spinal surgery though he has had cervical decompression many years ago (2022).  He had similar symptoms and was seen by his primary care on 05/10/2024 at which point he was discharged with Naprosyn  which provided improvement of symptoms.  He reports symptoms resolved until recurrence a few days ago and his current symptoms distribution and description is different than previous episodes.  He denies any associated fever, rash, bowel/bladder incontinence, lower extremity weakness, saddle anesthesia.  Denies personal history of malignancy.  He denies any recent imaging of his back; he had an x-ray of lumbar spine 05/23/2015 that showed degenerative changes but he has not had anything since then.  He did take Tylenol  which has provided minimal improvement of symptoms.  Currently, pain is rated 8 on a 0-10 pain scale, localized to his midline back, described as sharp, worse with certain movements, no alleviating factors identified.  He has noticed several knots along his trapezius and at the back of his head and is unsure if these are related.  These areas are not painful.    Past Medical History:  Diagnosis Date   Arthritis    due to age   Back pain 05/22/2019   Bilateral varicoceles 08/28/2008   w/scrotal nodules on ultrasound likley granulomas   Cocaine abuse (HCC)    quit   ETOH abuse    quit   Headache    Hx of adenomatous polyp of colon    Hyperlipidemia    Hypertension    PONV (postoperative nausea and vomiting)    Right arm pain 05/29/2019   Stroke (HCC)    posterior limb, R internal capsule stroke 07/2012     Patient Active Problem List   Diagnosis Date Noted   Lower abdominal pain 01/17/2024   Olecranon bursitis of right elbow 11/08/2023   Neuropathy of hand 08/30/2023   Traumatic nondisplaced spondylolisthesis of second cervical vertebra with closed fracture (HCC) 08/30/2023   Bilateral shoulder pain 08/25/2023   Bilateral hand swelling 08/11/2023   Insomnia 07/26/2023   Tracheostomy complication (HCC) 07/26/2023   Seizures (HCC) 07/26/2023   Liver nodule 07/26/2023   Hyponatremia 07/26/2023   Anemia 07/26/2023   SAH (subarachnoid hemorrhage) (HCC) 07/04/2023   Dysphagia, pharyngoesophageal phase 07/04/2023   Malnutrition of moderate degree 06/01/2023   TBI (traumatic brain injury) (HCC) 05/29/2023   Sciatica of left side 05/12/2022   Hemorrhoids 01/25/2022   Left groin pain 01/25/2021   Acute right-sided low back pain 12/03/2020   Prediabetes 02/27/2020   Folliculitis 12/15/2017   Hx of adenomatous polyp of colon 07/19/2016   Lumbar degenerative disc disease 08/05/2015   Erectile dysfunction 04/22/2015   Other hyperlipidemia 04/22/2015   Health care maintenance 05/14/2014   GERD (gastroesophageal reflux disease) 09/19/2012   History of CVA (cerebrovascular accident) 08/02/2012   Essential hypertension 05/31/2007    Past Surgical History:  Procedure Laterality Date   ANTERIOR CERVICAL DECOMPRESSION/DISCECTOMY FUSION 4 LEVELS N/A 02/18/2021   Procedure: ANTERIOR CERVICAL DECOMPRESSION FUSION CERVIAL 4- CERVICAL 5, CERVICAL 5- CERVICAL 6, CERVICAL 6- CERVICAL  7 WITH INSTRUMENTATION AND ALLOGRAFT;  Surgeon: Beuford Anes, MD;  Location: [facility] OR;  Service: Orthopedics;  Laterality: N/A;   bone spur repair Left    COLONOSCOPY     HAND SURGERY     left hand   TRACHEOSTOMY TUBE PLACEMENT N/A 06/19/2023   Procedure: TRACHEOSTOMY;  Surgeon: Paola Dreama SAILOR, MD;  Location: [facility] OR;  Service: General;  Laterality: N/A;       Home Medications    Prior to Admission medications   Medication Sig Start Date End Date Taking? Authorizing Provider  Baclofen  5 MG TABS Take 1 tablet (5 mg total) by mouth at bedtime as needed. 07/12/24  Yes Sheenah Dimitroff K, PA-C  lidocaine  (LIDODERM ) 5 % Place 1 patch onto the skin daily. Remove & Discard patch within 12 hours or as directed by MD 07/12/24  Yes Jakeem Grape K, PA-C  amLODipine  (NORVASC ) 10 MG tablet Take 1 tablet by mouth once daily 05/21/24   Nooruddin, Saad, MD  Cholecalciferol  (VITAMIN D3) 50 MCG (2000 UT) TABS Take 1 tablet (2,000 Units total) by mouth daily. 09/27/23   Nooruddin, Saad, MD  diclofenac  Sodium (VOLTAREN  ARTHRITIS PAIN) 1 % GEL Apply 2 g topically 4 (four) times daily. 05/10/24   Nooruddin, Saad, MD  gabapentin  (NEURONTIN ) 300 MG capsule Take 300 mg by mouth 2 (two) times daily. Takes 300 mg BID, 600 mg qhs 09/16/23   [provider]  naproxen  (NAPROSYN ) 500 MG tablet Take 1 tablet (500 mg total) by mouth 2 (two) times daily with a meal. 05/10/24 05/10/25  Nooruddin, Saad, MD  polyethylene glycol (MIRALAX ) 17 g packet Take 17 g by mouth daily as needed. 11/07/23   Masters, Katie, DO  rosuvastatin  (CRESTOR ) 40 MG tablet Take 1 tablet (40 mg total) by mouth daily. 01/18/24   Waymond Cart, MD  tadalafil  (CIALIS ) 10 MG tablet TAKE 1 TABLET BY MOUTH ONCE DAILY AS NEEDED FOR ERECTILE DYSFUNCTION 05/30/24   Nooruddin, Saad, MD  triamterene -hydrochlorothiazide  (MAXZIDE -25) 37.5-25 MG tablet Take 1 tablet by mouth once daily 03/25/24   Nooruddin, Saad, MD    Family History Family History  Problem Relation Age of Onset   Pancreatic cancer Father    Colon cancer Neg Hx    Esophageal cancer Neg Hx    Rectal cancer Neg Hx    Stomach cancer Neg Hx     Social History Social History[1]   Allergies   Ace inhibitors and Penicillins   Review of Systems Review of Systems  Constitutional:  Positive for activity change. Negative for appetite change, fatigue and fever.  Gastrointestinal:  Negative for diarrhea, nausea  and vomiting.  Genitourinary:  Negative for dysuria, frequency, genital sores, hematuria and urgency.  Musculoskeletal:  Positive for back pain. Negative for arthralgias and myalgias.  Skin:  Negative for rash.  Neurological:  Negative for weakness and numbness.     Physical Exam Triage Vital Signs ED Triage Vitals  Encounter Vitals Group     BP 07/12/24 1141 135/88     Girls Systolic BP Percentile --      Girls Diastolic BP Percentile --      Boys Systolic BP Percentile --      Boys Diastolic BP Percentile --      Pulse Rate 07/12/24 1141 62     Resp 07/12/24 1141 16     Temp 07/12/24 1141 (!) 97.5 F (36.4 C)     Temp Source 07/12/24 1141 Oral     SpO2 07/12/24 1141 96 %  Weight --      Height --      Head Circumference --      Peak Flow --      Pain Score 07/12/24 1140 8     Pain Loc --      Pain Education --      Exclude from Growth Chart --    No data found.  Updated Vital Signs BP 135/88 (BP Location: Right Arm)   Pulse 62   Temp (!) 97.5 F (36.4 C) (Oral)   Resp 16   SpO2 96%   Visual Acuity Right Eye Distance:   Left Eye Distance:   Bilateral Distance:    Right Eye Near:   Left Eye Near:    Bilateral Near:     Physical Exam Vitals reviewed.  Constitutional:      General: He is awake.     Appearance: Normal appearance. He is well-developed. He is not ill-appearing.     Comments: Very pleasant male appears stated age in no acute distress sitting comfortably in exam room  HENT:     Head: Normocephalic and atraumatic.     Mouth/Throat:     Pharynx: Uvula midline. No oropharyngeal exudate or posterior oropharyngeal erythema.  Cardiovascular:     Rate and Rhythm: Normal rate and regular rhythm.     Heart sounds: Normal heart sounds, S1 normal and S2 normal. No murmur heard. Pulmonary:     Effort: Pulmonary effort is normal.     Breath sounds: Normal breath sounds. No stridor. No wheezing, rhonchi or rales.     Comments: Clear to auscultation  bilaterally Abdominal:     Palpations: Abdomen is soft.     Tenderness: There is no abdominal tenderness.  Musculoskeletal:     Cervical back: No tenderness or bony tenderness.     Thoracic back: Tenderness and bony tenderness present.     Lumbar back: Tenderness and bony tenderness present. Negative right straight leg raise test and negative left straight leg raise test.     Comments: Back: Pain with percussion of lower thoracic and lumbar vertebrae.  No deformity or step-off noted.  Decreased range of motion with extension and rotation secondary to pain.  Tenderness palpation of the right lumbar paraspinal muscles.  Strength 5/5 bilateral lower extremities.  Negative straight leg raise bilaterally.  Skin:    Findings: No rash.         Comments: No rash on exam.  Patient has 3 cm x 2 cm mobile nontender cyst along right trapezius.  Similar but smaller (1 cm x 1 cm) mobile nontender cyst noted right occiput.  No erythema or drainage.  Neurological:     Mental Status: He is alert.  Psychiatric:        Behavior: Behavior is cooperative.      UC Treatments / Results  Labs (all labs ordered are listed, but only abnormal results are displayed) Labs Reviewed  CYTOLOGY, (ORAL, ANAL, URETHRAL) ANCILLARY ONLY    EKG   Radiology DG Thoracic Spine 2 View Result Date: 07/12/2024 CLINICAL DATA:  Low back pain EXAM: THORACIC SPINE 2 VIEWS COMPARISON:  None Available. FINDINGS: There is no evidence of thoracic spine fracture. Alignment is normal. Osteophyte formation is noted at multiple levels of the lower thoracic spine. IMPRESSION: Multilevel degenerative changes. No acute abnormality seen. Electronically Signed   By: Lynwood Landy Raddle M.D.   On: 07/12/2024 13:30   DG Lumbar Spine Complete Result Date: 07/12/2024 CLINICAL DATA:  Lower back  pain for 3 days. EXAM: LUMBAR SPINE - COMPLETE 4+ VIEW COMPARISON:  None Available. FINDINGS: There is no evidence of lumbar spine fracture. Alignment is  normal. Mild degenerative disc disease is noted at L1-2, L2-3, L4-5 and L5-S1. Anterior osteophyte formation is noted at all levels. IMPRESSION: Multilevel degenerative changes as described above. No acute abnormality seen. Electronically Signed   By: Lynwood Landy Raddle M.D.   On: 07/12/2024 13:27    Procedures Procedures (including critical care time)  Medications Ordered in UC Medications - No data to display  Initial Impression / Assessment and Plan / UC Course  I have reviewed the triage vital signs and the nursing notes.  Pertinent labs & imaging results that were available during my care of the patient were reviewed by me and considered in my medical decision making (see chart for details).     Patient is well-appearing, afebrile, nontoxic, nontachycardic.  X-rays of his thoracic and lumbar spine were obtained given bony tenderness that showed widespread degenerative changes without acute osseous abnormality based on my primary read.  At the time of discharge we were waiting for radiologist over read and will contact patient if this differs and changes our treatment plan.  Will treat for musculoskeletal etiology and he was started on Lidoderm  patches to be used during the day and then removed at night with instruction to use only 1 patch per 24 hours.  He can use Tylenol  over-the-counter and was also given a low-dose baclofen  (5 mg at night) and we discussed that this medication is sedating so he should use his right before going to bed to prevent falls and is not to drive or drink alcohol  while taking it.  No indication for dose adjustment based on metabolic panel from 01/01/2024 with a creatinine of 0.3 and calculated creatinine clearance of 82 mL/min.  Did recommend heat and gentle stretch.  If his symptoms are not improving quickly he is to follow-up with sports medicine as they may be able to help arrange physical therapy and/or more advanced imaging which we do not have access to an urgent  care.  He was given the contact information for local provider with instruction to call to schedule appointment.  If he has any worsening or changing symptoms he needs to be seen immediately including bowel/bladder incontinence, lower extremity weakness, saddle anesthesia, fever, rash.  Return precautions given.   Patient was noted to have several cysts that did not appear inflamed or infected.  We discussed that he can follow-up with his primary care to consider referral to dermatology or surgery for excision.  We discussed that if these become irritated he should follow-up as we may consider antibiotic use but at this time I do not think that is warranted.  At the end of the visit patient requested STI testing as there was the potential he could have been exposed to HSV.  He denied any current symptoms including vesicles, rash, abnormal discharge.  Discussed that we typically do not provide blood testing for HSV and that if he develops any lesions he is to return for reevaluation.  STI swab was collected and is pending.  He declined HIV and syphilis testing.  He is to abstain from sex until he receives his results and we discussed that if anything changes and he develop symptoms he should return for reevaluation.  Patient expressed understanding and agreement the treatment plan.  Final Clinical Impressions(s) / UC Diagnoses   Final diagnoses:  Acute midline low back pain  without sciatica  Acute midline thoracic back pain  DDD (degenerative disc disease), thoracolumbar  Screening examination for STI  Dermoid cyst     Discharge Instructions      I saw a lot of arthritis on your x-ray.  I will contact you if the radiologist sees something that I did not that changes our treatment plan.  Take Tylenol  for pain relief.  Apply lidocaine  patch during the day and then remove this at night; use only 1 patch for 24 hours.  Take baclofen  at night.  This will make you sleepy so do not drive or drink alcohol   taking it.  Take it just before you go to bed as this can increase the risk of falls.  This replaces previously prescribed tizanidine  (Zanaflex ) as these are similar medications and should not be combined.  I recommend following up with sports medicine for further evaluation and management as they can help arrange additional imaging such as an MRI and referral to physical therapy.  If anything worsens and you have worsening pain, weight loss, fever, weakness, going to the bathroom yourself without noticing it, numbness or tingling in your legs you need to be seen immediately.  I will contact you if your testing is abnormal.  Abstain from sex and to receive the result.  If you develop any rash or lesions please return for reevaluation.  The knots that are on your neck are likely cysts.  As long as they are not bothering you we will leave them alone.  If they become red, painful, begin to drain something please return so we can consider additional treatment.     ED Prescriptions     Medication Sig Dispense Auth. Provider   lidocaine  (LIDODERM ) 5 % Place 1 patch onto the skin daily. Remove & Discard patch within 12 hours or as directed by MD 14 patch Lolah Coghlan K, PA-C   Baclofen  5 MG TABS Take 1 tablet (5 mg total) by mouth at bedtime as needed. 7 tablet Aubreanna Percle K, PA-C      PDMP not reviewed this encounter.     [1]  Social History Tobacco Use   Smoking status: Former    Current packs/day: 0.00    Types: Cigarettes    Quit date: 07/18/2012    Years since quitting: 11.9   Smokeless tobacco: Never  Vaping Use   Vaping status: Never Used  Substance Use Topics   Alcohol  use: No    Alcohol /week: 0.0 standard drinks of alcohol     Comment: quit 2011   Drug use: No    Comment: h/o cocaine abuse -- been a LONG time     Sherrell Rocky POUR, PA-C 07/12/24 1335  "

## 2024-07-13 ENCOUNTER — Other Ambulatory Visit: Payer: Self-pay | Admitting: Student

## 2024-07-15 LAB — CYTOLOGY, (ORAL, ANAL, URETHRAL) ANCILLARY ONLY
Chlamydia: NEGATIVE
Comment: NEGATIVE
Comment: NEGATIVE
Comment: NORMAL
Neisseria Gonorrhea: NEGATIVE
Trichomonas: NEGATIVE

## 2024-07-15 NOTE — Telephone Encounter (Signed)
 Patient is scheduled to see Dr.Patel 12/30.

## 2024-07-15 NOTE — Telephone Encounter (Signed)
 Medication sent to pharmacy

## 2024-07-16 ENCOUNTER — Ambulatory Visit: Payer: Self-pay | Admitting: Student

## 2024-07-22 ENCOUNTER — Ambulatory Visit: Payer: Self-pay

## 2024-07-22 ENCOUNTER — Ambulatory Visit

## 2024-07-22 VITALS — BP 142/84 | HR 88 | Temp 98.4°F | Ht 67.0 in | Wt 154.6 lb

## 2024-07-22 DIAGNOSIS — Z0000 Encounter for general adult medical examination without abnormal findings: Secondary | ICD-10-CM

## 2024-07-22 DIAGNOSIS — I10 Essential (primary) hypertension: Secondary | ICD-10-CM

## 2024-07-22 DIAGNOSIS — N529 Male erectile dysfunction, unspecified: Secondary | ICD-10-CM | POA: Diagnosis not present

## 2024-07-22 DIAGNOSIS — S39012A Strain of muscle, fascia and tendon of lower back, initial encounter: Secondary | ICD-10-CM | POA: Diagnosis not present

## 2024-07-22 MED ORDER — MELOXICAM 7.5 MG PO TABS: 7.5000 mg | ORAL_TABLET | Freq: Every day | ORAL | 0 refills | Status: AC

## 2024-07-22 MED ORDER — AMLODIPINE BESYLATE 10 MG PO TABS: 10.0000 mg | ORAL_TABLET | Freq: Every day | ORAL | 3 refills | Status: AC

## 2024-07-22 MED ORDER — TIZANIDINE HCL 4 MG PO TABS: 4.0000 mg | ORAL_TABLET | Freq: Every day | ORAL | 0 refills | Status: AC

## 2024-07-22 MED ORDER — TADALAFIL 10 MG PO TABS: 10.0000 mg | ORAL_TABLET | Freq: Every day | ORAL | 3 refills | Status: AC | PRN

## 2024-07-22 NOTE — Telephone Encounter (Signed)
 FYI Only or Action Required?: FYI only for provider: appointment scheduled on This morning.  Patient was last seen in primary care on 05/10/2024 by Nooruddin, Saad, MD.  Called Nurse Triage reporting Back Pain.  Symptoms began several weeks ago.  Interventions attempted: Other: Seen at UC, given muscle relaxer. .  Symptoms are: gradually worsening.  Triage Disposition: See HCP Within 4 Hours (Or PCP Triage)  Patient/caregiver understands and will follow disposition?: Yes                     Copied from CRM [id number]. Topic: Clinical - Red Word Triage >> Jul 22, 2024  8:12 AM Diannia DEL wrote: Red Word that prompted transfer to Nurse Triage: Patient has an appointment for today at 3:45 but called back because he is in a lot of pain. He went to urgent care on 12/26 and they did xrays, they gave him medicine but its not working. He's in pain in his lower back Reason for Disposition  [1] SEVERE back pain (e.g., excruciating, unable to do any normal activities) AND [2] not improved 2 hours after pain medicine  Answer Assessment - Initial Assessment Questions 1. ONSET: When did the pain begin? (e.g., minutes, hours, days)     12/26 2. LOCATION: Where does it hurt? (upper, mid or lower back)     Lower back 3. SEVERITY: How bad is the pain?  (e.g., Scale 1-10; mild, moderate, or severe)     severe 4. PATTERN: Is the pain constant? (e.g., yes, no; constant, intermittent)      constant 5. RADIATION: Does the pain shoot into your legs or somewhere else?     no 6. CAUSE:  What do you think is causing the back pain?      Kidney? 7. BACK OVERUSE:  Any recent lifting of heavy objects, strenuous work or exercise?     no 8. MEDICINES: What have you taken so far for the pain? (e.g., nothing, acetaminophen , NSAIDS)     Muscle relaxer 9. NEUROLOGIC SYMPTOMS: Do you have any weakness, numbness, or problems with bowel/bladder control?     no 10. OTHER SYMPTOMS:  Do you have any other symptoms? (e.g., fever, abdomen pain, burning with urination, blood in urine)       Pressure, might be on left  Protocols used: Back Pain-A-AH

## 2024-07-22 NOTE — Progress Notes (Signed)
 "  CC: Acute Concern of lower back pain  HPI:  [name] is a 69 y.o. male with pertinent PMH of HTN, TBI, GERD, prediabetes, and lower back pain who presents for acute lower back pain. Please see problem based assessment and plan for further history.  ROS  Medications: Current Outpatient Medications  Medication Instructions   amLODipine  (NORVASC ) 10 mg, Oral, Daily   Baclofen  5 mg, Oral, At bedtime PRN   diclofenac  Sodium (VOLTAREN  ARTHRITIS PAIN) 2 g, Topical, 4 times daily   gabapentin  (NEURONTIN ) 300 mg, 2 times daily   lidocaine  (LIDODERM ) 5 % 1 patch, Transdermal, Every 24 hours, Remove & Discard patch within 12 hours or as directed by MD   naproxen  (NAPROSYN ) 500 mg, Oral, 2 times daily with meals   polyethylene glycol (MIRALAX ) 17 g, Oral, Daily PRN   rosuvastatin  (CRESTOR ) 40 mg, Oral, Daily   tadalafil  (CIALIS ) 10 mg, Oral, Daily PRN   triamterene -hydrochlorothiazide  (MAXZIDE -25) 37.5-25 MG tablet 1 tablet, Oral, Daily   Vitamin D3 2,000 Units, Oral, Daily     Physical Exam:  Vitals:   07/22/24 0936  BP: (!) 166/87  Pulse: 91  Temp: 98.4 F (36.9 C)  TempSrc: Oral  SpO2: 94%  Weight: 154 lb 9.6 oz (70.1 kg)  Height: 5' 7 (1.702 m)    Physical Exam Constitutional:      General: He is not in acute distress.    Appearance: He is not ill-appearing.  Cardiovascular:     Rate and Rhythm: Normal rate and regular rhythm.  Pulmonary:     Effort: No respiratory distress.  Musculoskeletal:     Thoracic back: Tenderness present. No swelling, deformity or bony tenderness.     Lumbar back: Spasms and tenderness present. No swelling, signs of trauma or bony tenderness. Negative right straight leg raise test and negative left straight leg raise test.     Right lower leg: No edema.     Left lower leg: No edema.     Comments: Tenderness over the bilateral paraspinal muscles. No bony tenderness. No decreased ROM but associated with reproduction of lower back pain. No  rash visualized.  Neurological:     Mental Status: He is alert.       Assessment & Plan:   Assessment & Plan Strain of lumbar region, initial encounter The patient presents for lower back pain. He was seen for a similar episode in October of last year but reports that those symptoms resolved with Baclofen . His current episode started about 2 weeks ago without inciting incident or injury. He states that he woke up and felt a knot in the middle of his back along with a pressure like band. After three days of symptoms he presented to urgent care and was prescribed Baclofen , Lidocaine  patches, and told to continue Tylenol . He reports that his symptoms have not resolved since then. Denies radicular symptoms. Denies bowel and bladder incontinence. Denies fevers, chills, nausea, and vomiting. He states that he has taken Tizanidine  in the past and tolerated it well. He has a history of sciatica that resolved with spinal injections. On physical exam, there is muscle spasm of the paraspinals. Imaging from urgent care demonstrates degenerative changes of the lumbar and thoracic spine. Given his improvement with tizanidine  and naproxen  in the past, we will start an NSAID and a muscle relaxant. I also think he will benefit from physical therapy given his repeated exacerbations.  -Tizanidine  4mg  once daily, counseled on sedative side effects -Meloxicam  7.5 mg  once daily, instructed to take with food and to not take this medication with Ibuprofen  -Referral to physical therapy. Essential hypertension He has a history of hypertension. His current regimen Amlodipine  10mg  daily and triamterene -hydrochlorothiazide  37.5-25mg . He reports that he is adherent to his medications and asks for refills on amlodipine . He denies headaches, chest pain, shortness of breath, peripheral edema, and vision changes. His BP in the office today is elevated but is also complicated by his back pain. Will defer further BP management today  as his BP is likely elevated in the setting of acute lower back pain.  -Refill amlodipine  -Continue Maxzide  at current dosing   Erectile dysfunction, unspecified erectile dysfunction type Refilled Cialis  today. Healthcare maintenance Reports that he got his influenza vaccine at [facility] pharmacy this year.  No orders of the defined types were placed in this encounter.  Follow-up in 3 months for blood pressure or earlier if back pain does not resolve.  Patient discussed with Dr. MICAEL Riis Winfrey   Melvenia Morrison, MD [facility] Internal Medicine Resident PGY-1 Clinic Phone: [phone] Please contact the on call pager at [phone] for any urgent or emergent needs.  "

## 2024-07-22 NOTE — Assessment & Plan Note (Signed)
 He has a history of hypertension. His current regimen Amlodipine  10mg  daily and triamterene -hydrochlorothiazide  37.5-25mg . He reports that he is adherent to his medications and asks for refills on amlodipine . He denies headaches, chest pain, shortness of breath, peripheral edema, and vision changes. His BP in the office today is elevated but is also complicated by his back pain. Will defer further BP management today as his BP is likely elevated in the setting of acute lower back pain.  -Refill amlodipine  -Continue Maxzide  at current dosing

## 2024-07-22 NOTE — Assessment & Plan Note (Signed)
Refilled Cialis today

## 2024-07-22 NOTE — Telephone Encounter (Signed)
Pt has an appt this am

## 2024-07-22 NOTE — Patient Instructions (Addendum)
 Thank you, [name], for allowing us  to provide your care today. Today we discussed . . .  > Lower back pain       - Your lower back pain seems to be caused by a muscular problem.  I have prescribed tizanidine  4 mg once daily, meloxicam  7.5 mg once daily, and referred you to physical therapy.  If her symptoms do not improve in the next 3 to 4 weeks, call our clinic and we may be able to do other things to help the pain. > Blood pressure       - I have refilled your blood pressure medications > Erectile dysfunction       - I have refilled your Cialis    I have ordered the following labs for you:  Lab Orders  No laboratory test(s) ordered today      Referrals ordered today:    Referral Orders         Ambulatory referral to Physical Therapy       Follow up: as needed or in 3 months   Remember:  Should you have any questions or concerns please call the internal medicine clinic at [phone].     [name], [facility]

## 2024-07-24 NOTE — Progress Notes (Signed)
 [facility] Attending  Case discussed with the resident at the time of the visit.  We reviewed the resident's history and exam and pertinent patient test results.  I agree with the assessment, diagnosis, and plan of care documented in the resident's note.

## 2024-08-16 ENCOUNTER — Other Ambulatory Visit: Payer: Self-pay | Admitting: Student

## 2024-08-16 NOTE — Telephone Encounter (Signed)
 Medication sent to pharmacy
